# Patient Record
Sex: Female | Born: 1990 | Race: White | Hispanic: No | Marital: Married | Smoking: Never smoker
Health system: Southern US, Community
[De-identification: ages and names within clinical notes are randomized; demographics above are authoritative.]

## PROBLEM LIST (undated history)

## (undated) ENCOUNTER — Inpatient Hospital Stay: Payer: Self-pay

## (undated) DIAGNOSIS — F419 Anxiety disorder, unspecified: Secondary | ICD-10-CM

## (undated) DIAGNOSIS — Z3490 Encounter for supervision of normal pregnancy, unspecified, unspecified trimester: Secondary | ICD-10-CM

## (undated) DIAGNOSIS — F329 Major depressive disorder, single episode, unspecified: Secondary | ICD-10-CM

## (undated) DIAGNOSIS — F32A Depression, unspecified: Secondary | ICD-10-CM

## (undated) DIAGNOSIS — N921 Excessive and frequent menstruation with irregular cycle: Secondary | ICD-10-CM

## (undated) DIAGNOSIS — Z9289 Personal history of other medical treatment: Secondary | ICD-10-CM

## (undated) DIAGNOSIS — R109 Unspecified abdominal pain: Secondary | ICD-10-CM

## (undated) DIAGNOSIS — R112 Nausea with vomiting, unspecified: Secondary | ICD-10-CM

## (undated) DIAGNOSIS — F319 Bipolar disorder, unspecified: Secondary | ICD-10-CM

## (undated) DIAGNOSIS — D649 Anemia, unspecified: Secondary | ICD-10-CM

## (undated) DIAGNOSIS — Z9889 Other specified postprocedural states: Secondary | ICD-10-CM

## (undated) HISTORY — DX: Encounter for supervision of normal pregnancy, unspecified, unspecified trimester: Z34.90

## (undated) HISTORY — PX: TONSILLECTOMY: SUR1361

## (undated) HISTORY — DX: Excessive and frequent menstruation with irregular cycle: N92.1

## (undated) HISTORY — DX: Unspecified abdominal pain: R10.9

## (undated) HISTORY — DX: Anemia, unspecified: D64.9

## (undated) HISTORY — DX: Personal history of other medical treatment: Z92.89

---
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
## 2012-07-16 ENCOUNTER — Ambulatory Visit: Payer: Self-pay | Admitting: Family Medicine

## 2012-07-16 LAB — RAPID STREP-A WITH REFLX: Micro Text Report: NEGATIVE

## 2012-07-21 ENCOUNTER — Ambulatory Visit: Payer: Self-pay | Admitting: Internal Medicine

## 2012-07-21 LAB — URINALYSIS, COMPLETE
Blood: NEGATIVE
Glucose,UR: NEGATIVE mg/dL (ref 0–75)
Nitrite: NEGATIVE
Ph: 6 (ref 4.5–8.0)
Specific Gravity: 1.025 (ref 1.003–1.030)

## 2012-07-21 LAB — CBC WITH DIFFERENTIAL/PLATELET
Basophil %: 0.4 %
Eosinophil #: 0.1 10*3/uL (ref 0.0–0.7)
Eosinophil %: 0.9 %
HCT: 37.1 % (ref 35.0–47.0)
HGB: 12.9 g/dL (ref 12.0–16.0)
Lymphocyte #: 1.8 10*3/uL (ref 1.0–3.6)
Lymphocyte %: 23.6 %
MCV: 87 fL (ref 80–100)
Monocyte #: 0.5 x10 3/mm (ref 0.2–0.9)
Monocyte %: 5.8 %
Neutrophil #: 5.4 10*3/uL (ref 1.4–6.5)
Neutrophil %: 69.3 %
RBC: 4.29 10*6/uL (ref 3.80–5.20)
RDW: 12.6 % (ref 11.5–14.5)
WBC: 7.8 10*3/uL (ref 3.6–11.0)

## 2012-07-21 LAB — COMPREHENSIVE METABOLIC PANEL
Albumin: 4 g/dL (ref 3.8–5.6)
Alkaline Phosphatase: 56 U/L — ABNORMAL LOW (ref 82–169)
BUN: 4 mg/dL — ABNORMAL LOW (ref 7–18)
Bilirubin,Total: 0.6 mg/dL (ref 0.2–1.0)
Calcium, Total: 9.2 mg/dL (ref 9.0–10.7)
Co2: 26 mmol/L (ref 21–32)
EGFR (African American): >60
Glucose: 84 mg/dL (ref 65–99)
SGOT(AST): 23 U/L (ref 0–26)
SGPT (ALT): 37 U/L (ref 12–78)
Sodium: 137 mmol/L (ref 136–145)

## 2012-07-21 LAB — WET PREP, GENITAL

## 2012-07-22 ENCOUNTER — Ambulatory Visit: Payer: Self-pay | Admitting: Family Medicine

## 2012-09-10 ENCOUNTER — Ambulatory Visit: Payer: Self-pay | Admitting: Advanced Practice Midwife

## 2012-09-19 ENCOUNTER — Observation Stay: Payer: Self-pay | Admitting: Obstetrics and Gynecology

## 2012-10-15 ENCOUNTER — Observation Stay: Payer: Self-pay

## 2012-10-16 LAB — URINALYSIS, COMPLETE
Bacteria: NONE SEEN
Bilirubin,UR: NEGATIVE
Blood: NEGATIVE
Glucose,UR: NEGATIVE mg/dL (ref 0–75)
Ketone: NEGATIVE
Ph: 7 (ref 4.5–8.0)
Protein: NEGATIVE
RBC,UR: NONE SEEN /HPF (ref 0–5)
Specific Gravity: 1.004 (ref 1.003–1.030)
Squamous Epithelial: <1
WBC UR: <1 /HPF (ref 0–5)

## 2012-10-16 LAB — FETAL FIBRONECTIN
Appearance: NORMAL
Fetal Fibronectin: NEGATIVE

## 2012-11-30 ENCOUNTER — Observation Stay: Payer: Self-pay

## 2012-11-30 LAB — URINALYSIS, COMPLETE
Bilirubin,UR: NEGATIVE
Blood: NEGATIVE
Glucose,UR: NEGATIVE mg/dL (ref 0–75)
Ketone: NEGATIVE
Leukocyte Esterase: NEGATIVE
Nitrite: NEGATIVE
Ph: 7 (ref 4.5–8.0)
Protein: NEGATIVE
RBC,UR: NONE SEEN /HPF (ref 0–5)
Specific Gravity: 1.005 (ref 1.003–1.030)
Squamous Epithelial: <1
WBC UR: <1 /HPF (ref 0–5)

## 2012-12-08 ENCOUNTER — Observation Stay: Payer: Self-pay | Admitting: Obstetrics and Gynecology

## 2012-12-08 LAB — PROTEIN / CREATININE RATIO, URINE: Creatinine, Urine: 18.3 mg/dL — ABNORMAL LOW (ref 30.0–125.0)

## 2012-12-11 ENCOUNTER — Ambulatory Visit: Payer: Self-pay | Admitting: Emergency Medicine

## 2012-12-13 LAB — BETA STREP CULTURE(ARMC)

## 2012-12-18 ENCOUNTER — Emergency Department: Payer: Self-pay | Admitting: Emergency Medicine

## 2012-12-18 LAB — TROPONIN I: Troponin-I: <0.02 ng/mL

## 2012-12-18 LAB — BASIC METABOLIC PANEL
Anion Gap: 8 (ref 7–16)
BUN: 3 mg/dL — ABNORMAL LOW (ref 7–18)
Chloride: 107 mmol/L (ref 98–107)
Co2: 23 mmol/L (ref 21–32)
EGFR (African American): >60
EGFR (Non-African Amer.): >60
Glucose: 93 mg/dL (ref 65–99)
Osmolality: 272 (ref 275–301)
Potassium: 3.4 mmol/L — ABNORMAL LOW (ref 3.5–5.1)
Sodium: 138 mmol/L (ref 136–145)

## 2012-12-18 LAB — CBC
HCT: 30.7 % — ABNORMAL LOW (ref 35.0–47.0)
MCH: 29.4 pg (ref 26.0–34.0)
MCHC: 35.1 g/dL (ref 32.0–36.0)
Platelet: 226 10*3/uL (ref 150–440)
RBC: 3.67 10*6/uL — ABNORMAL LOW (ref 3.80–5.20)
RDW: 13.5 % (ref 11.5–14.5)
WBC: 9.9 10*3/uL (ref 3.6–11.0)

## 2012-12-18 LAB — CK TOTAL AND CKMB (NOT AT ARMC): CK-MB: <0.5 ng/mL — ABNORMAL LOW (ref 0.5–3.6)

## 2012-12-29 ENCOUNTER — Observation Stay: Payer: Self-pay | Admitting: Obstetrics and Gynecology

## 2012-12-29 LAB — URINALYSIS, COMPLETE
Glucose,UR: NEGATIVE mg/dL (ref 0–75)
Ketone: NEGATIVE
Nitrite: NEGATIVE
Ph: 7 (ref 4.5–8.0)
Protein: NEGATIVE
RBC,UR: 1 /HPF (ref 0–5)
Squamous Epithelial: <1
WBC UR: <1 /HPF (ref 0–5)

## 2013-01-13 ENCOUNTER — Observation Stay: Payer: Self-pay | Admitting: Obstetrics and Gynecology

## 2013-01-25 ENCOUNTER — Observation Stay: Payer: Self-pay | Admitting: Obstetrics and Gynecology

## 2013-01-29 ENCOUNTER — Observation Stay: Payer: Self-pay | Admitting: Obstetrics and Gynecology

## 2013-02-07 ENCOUNTER — Inpatient Hospital Stay: Payer: Self-pay

## 2013-02-07 LAB — CBC WITH DIFFERENTIAL/PLATELET
Basophil #: 0 10*3/uL (ref 0.0–0.1)
Basophil %: 0.3 %
Eosinophil %: 0.9 %
HGB: 10.4 g/dL — ABNORMAL LOW (ref 12.0–16.0)
Lymphocyte #: 1.6 10*3/uL (ref 1.0–3.6)
MCH: 27 pg (ref 26.0–34.0)
MCHC: 34.1 g/dL (ref 32.0–36.0)
MCV: 79 fL — ABNORMAL LOW (ref 80–100)
Monocyte #: 0.8 x10 3/mm (ref 0.2–0.9)
Monocyte %: 8.1 %
Neutrophil #: 7.6 10*3/uL — ABNORMAL HIGH (ref 1.4–6.5)
Neutrophil %: 74.6 %
Platelet: 266 10*3/uL (ref 150–440)
RBC: 3.86 10*6/uL (ref 3.80–5.20)

## 2013-09-21 ENCOUNTER — Emergency Department: Payer: Self-pay | Admitting: Emergency Medicine

## 2013-10-26 DIAGNOSIS — R109 Unspecified abdominal pain: Secondary | ICD-10-CM

## 2013-10-26 DIAGNOSIS — N921 Excessive and frequent menstruation with irregular cycle: Secondary | ICD-10-CM

## 2013-10-26 HISTORY — DX: Excessive and frequent menstruation with irregular cycle: N92.1

## 2013-10-26 HISTORY — DX: Unspecified abdominal pain: R10.9

## 2014-04-16 LAB — COMPREHENSIVE METABOLIC PANEL
ANION GAP: 11 (ref 7–16)
Albumin: 3.9 g/dL (ref 3.4–5.0)
Alkaline Phosphatase: 71 U/L
BUN: 3 mg/dL — ABNORMAL LOW (ref 7–18)
Bilirubin,Total: 0.8 mg/dL (ref 0.2–1.0)
CO2: 21 mmol/L (ref 21–32)
Calcium, Total: 9.2 mg/dL (ref 8.5–10.1)
Chloride: 104 mmol/L (ref 98–107)
Creatinine: 0.61 mg/dL (ref 0.60–1.30)
EGFR (African American): >60
EGFR (Non-African Amer.): >60
GLUCOSE: 89 mg/dL (ref 65–99)
OSMOLALITY: 268 (ref 275–301)
Potassium: 3.4 mmol/L — ABNORMAL LOW (ref 3.5–5.1)
SGOT(AST): 33 U/L (ref 15–37)
SGPT (ALT): 72 U/L — ABNORMAL HIGH
Sodium: 136 mmol/L (ref 136–145)
Total Protein: 7.6 g/dL (ref 6.4–8.2)

## 2014-04-16 LAB — CBC WITH DIFFERENTIAL/PLATELET
BASOS PCT: 0.3 %
Basophil #: 0 10*3/uL (ref 0.0–0.1)
Eosinophil #: 0.1 10*3/uL (ref 0.0–0.7)
Eosinophil %: 0.4 %
HCT: 39.2 % (ref 35.0–47.0)
HGB: 13.3 g/dL (ref 12.0–16.0)
LYMPHS ABS: 2.1 10*3/uL (ref 1.0–3.6)
Lymphocyte %: 14.6 %
MCH: 29.7 pg (ref 26.0–34.0)
MCHC: 33.9 g/dL (ref 32.0–36.0)
MCV: 88 fL (ref 80–100)
MONO ABS: 0.8 x10 3/mm (ref 0.2–0.9)
Monocyte %: 5.6 %
NEUTROS ABS: 11.6 10*3/uL — AB (ref 1.4–6.5)
Neutrophil %: 79.1 %
Platelet: 250 10*3/uL (ref 150–440)
RBC: 4.48 10*6/uL (ref 3.80–5.20)
RDW: 12.9 % (ref 11.5–14.5)
WBC: 14.6 10*3/uL — AB (ref 3.6–11.0)

## 2014-04-16 LAB — URINALYSIS, COMPLETE
Bacteria: NONE SEEN
Bilirubin,UR: NEGATIVE
Blood: NEGATIVE
Glucose,UR: NEGATIVE mg/dL (ref 0–75)
Leukocyte Esterase: NEGATIVE
Nitrite: NEGATIVE
PH: 5 (ref 4.5–8.0)
PROTEIN: NEGATIVE
RBC,UR: 2 /HPF (ref 0–5)
Specific Gravity: 1.015 (ref 1.003–1.030)
WBC UR: 3 /HPF (ref 0–5)

## 2014-04-16 LAB — HCG, QUANTITATIVE, PREGNANCY: Beta Hcg, Quant.: 198963 m[IU]/mL — ABNORMAL HIGH

## 2014-04-17 ENCOUNTER — Inpatient Hospital Stay: Payer: Self-pay | Admitting: Obstetrics and Gynecology

## 2014-04-17 LAB — GC/CHLAMYDIA PROBE AMP

## 2014-04-17 LAB — URINALYSIS, COMPLETE
BILIRUBIN, UR: NEGATIVE
BLOOD: NEGATIVE
Bacteria: NONE SEEN
Glucose,UR: NEGATIVE mg/dL (ref 0–75)
Ketone: NEGATIVE
LEUKOCYTE ESTERASE: NEGATIVE
NITRITE: NEGATIVE
PH: 7 (ref 4.5–8.0)
Protein: NEGATIVE
RBC, UR: NONE SEEN /HPF (ref 0–5)
Specific Gravity: 1.008 (ref 1.003–1.030)
WBC UR: <1 /HPF (ref 0–5)

## 2014-04-17 LAB — T4, FREE: FREE THYROXINE: 1.05 ng/dL (ref 0.76–1.46)

## 2014-04-17 LAB — WET PREP, GENITAL

## 2014-04-17 LAB — TSH: THYROID STIMULATING HORM: 1.19 u[IU]/mL

## 2014-04-18 LAB — URINE CULTURE

## 2014-08-05 ENCOUNTER — Observation Stay: Payer: Self-pay | Admitting: Obstetrics & Gynecology

## 2014-08-05 ENCOUNTER — Emergency Department: Payer: Self-pay | Admitting: Emergency Medicine

## 2014-09-30 ENCOUNTER — Observation Stay: Payer: Self-pay | Admitting: Obstetrics and Gynecology

## 2014-10-01 ENCOUNTER — Observation Stay: Payer: Self-pay | Admitting: Obstetrics and Gynecology

## 2014-10-22 ENCOUNTER — Emergency Department: Payer: Self-pay | Admitting: Emergency Medicine

## 2014-10-27 ENCOUNTER — Observation Stay: Payer: Self-pay

## 2014-10-27 LAB — URINALYSIS, COMPLETE
BACTERIA: NONE SEEN
BLOOD: NEGATIVE
Bilirubin,UR: NEGATIVE
Glucose,UR: NEGATIVE mg/dL (ref 0–75)
Ketone: NEGATIVE
Leukocyte Esterase: NEGATIVE
Nitrite: NEGATIVE
Ph: 7 (ref 4.5–8.0)
Protein: NEGATIVE
RBC,UR: <1 /HPF (ref 0–5)
Specific Gravity: 1.012 (ref 1.003–1.030)
WBC UR: 4 /HPF (ref 0–5)

## 2014-11-04 ENCOUNTER — Observation Stay
Admit: 2014-11-04 | Disposition: A | Discharge status: Home / Self Care | Payer: Self-pay | Attending: Obstetrics and Gynecology | Admitting: Obstetrics and Gynecology

## 2014-11-09 ENCOUNTER — Emergency Department
Admit: 2014-11-09 | Disposition: A | Discharge status: Home / Self Care | Payer: Self-pay | Admitting: Emergency Medicine

## 2014-11-12 ENCOUNTER — Observation Stay
Admit: 2014-11-12 | Disposition: A | Discharge status: Home / Self Care | Payer: Self-pay | Attending: Obstetrics and Gynecology | Admitting: Obstetrics and Gynecology

## 2014-11-17 ENCOUNTER — Inpatient Hospital Stay
Admit: 2014-11-17 | Disposition: A | Discharge status: Home / Self Care | Payer: Self-pay | Attending: Obstetrics and Gynecology | Admitting: Obstetrics and Gynecology

## 2014-11-18 LAB — CBC WITH DIFFERENTIAL/PLATELET
Basophil #: 0.1 10*3/uL (ref 0.0–0.1)
Basophil %: 0.4 %
COMMENT - H1-COM2: NORMAL
EOS ABS: 0.7 10*3/uL (ref 0.0–0.7)
Eosinophil %: 5 %
HCT: 34 % — AB (ref 35.0–47.0)
HGB: 11 g/dL — ABNORMAL LOW (ref 12.0–16.0)
Lymphocyte #: 1.9 10*3/uL (ref 1.0–3.6)
Lymphocyte %: 14.4 %
MCH: 26.4 pg (ref 26.0–34.0)
MCHC: 32.2 g/dL (ref 32.0–36.0)
MCV: 82 fL (ref 80–100)
MONO ABS: 0.9 x10 3/mm (ref 0.2–0.9)
Monocyte %: 6.8 %
NEUTROS ABS: 9.9 10*3/uL — AB (ref 1.4–6.5)
Neutrophil %: 73.4 %
PLATELETS: 279 10*3/uL (ref 150–440)
RBC: 4.15 10*6/uL (ref 3.80–5.20)
RDW: 15.7 % — AB (ref 11.5–14.5)
WBC: 13.5 10*3/uL — ABNORMAL HIGH (ref 3.6–11.0)

## 2014-11-18 LAB — GC/CHLAMYDIA PROBE AMP

## 2014-11-19 LAB — HEMATOCRIT: HCT: 28.8 % — ABNORMAL LOW (ref 35.0–47.0)

## 2014-11-26 NOTE — Consult Note (Signed)
PATIENT NAME:  Anna Flores, Anna Flores MR#:  [id number] DATE OF BIRTH:  April 18, 1991  DATE OF CONSULTATION:  04/17/2014  REFERRING PHYSICIAN:   CONSULTING PHYSICIAN:  Vadhir Mcnay A. Egbert GaribaldiBird, MD  REASON FOR CONSULTATION: Evaluation of abdominal pain, possible appendicitis.   HISTORY: This is a 24 year old white female in her second pregnancy at 8 weeks whose pregnancy has been complicated by hyperemesis gravidarum. She was napping yesterday afternoon around 2:00 p.m. and then had what she felt was a knot at her umbilicus. This was made worse with motion and with coughing or sneezing. The patient has had continued nausea and vomiting. No diarrhea. No fever. No sick contacts. No jaundice. No dysuria. Of note, the patient states that she does not have pain other than with motion or palpation of her abdomen. She has mild anorexia. While in the Emergency Room, and an MRI scan was performed and found to be negative for appendicitis.   PAST MEDICAL HISTORY: Significant for hyperemesis gravidarum.   PAST SURGICAL HISTORY: Tonsillectomy in January 2015.   ALLERGIES: CHERRIES AND AMOXICILLIN.   MEDICATIONS: Prenatal vitamins and Diclegis.   FAMILY HISTORY: Noncontributory.  REVIEW OF SYSTEMS: As described above.   PHYSICAL EXAMINATION:  GENERAL: The patient is in no obvious distress. She was chatting pleasantly with her significant other prior for me coming into the room, according to the RN.  VITAL SIGNS: Temperature is 98, pulse of 96, blood pressure is 120/72, 5 feet 4 inches, 161 pounds, BMI of 27.8.  ABDOMEN: Soft. There is mild tenderness in both the left lower quadrant and right lower quadrant. There is a negative Rovsing sign. There is no cellulitis. There is no mass, no hernia.   LABORATORY VALUES: Beta hCG is grossly positive. CBC 14.6, hemoglobin is 13.3, platelet count 250,000. Neutrophil count is elevated. Urinalysis is negative. Electrolytes are unremarkable. Liver function tests are normal. Lipase not  obtained.  IMAGING: Review of MRI scan is as described above.   IMPRESSION: A 24 year old, gravida 2, para 0 at [redacted] weeks gestation with abdominal pain, hyperemesis gravidarum, and abdominal pain which sounds by history and exam to be more consistent with musculoskeletal in origin. She will need to be admitted to the obstetric service. We will be available for serial abdominal examination. I discussed this with the patient and her significant other as well as with Dr. Jean RosenthalJackson by telephone.  TOTAL TIME SPENT: 45 minutes.    ____________________________ Redge GainerMark A. Egbert GaribaldiBird, MD mab:ST D: 04/17/2014 01:54:33 ET T: 04/17/2014 02:18:16 ET JOB#: [id number]  cc: Loraine LericheMark A. Egbert GaribaldiBird, MD, <Dictator> Jacci Ruberg A Quinne Pires MD ELECTRONICALLY SIGNED 04/17/2014 4:06

## 2014-11-26 NOTE — H&P (Signed)
Subjective/Chief Complaint Sudden-onset abdominal pain   History of Present Illness 24 yo G2P1001 at 71w1dgestational age by a 6 week ultrasound, pregnancy complicated by nausea and vomiting of pregnancy, who presents with sudden-onset right lower quadrant abdominal pain.  She was napping the afternoon of 9/12 and was awakened by periumbilical abdominal pain that was sharp and constant in nature.  The pain does not radiate.  The pain is stated to be 10 out of 10.  Moving her body, coughing, sneezing makes it worse.  Nothing makes it feel better.  She has been having nausea and vomiting in her pregnancy and has lost 11 pounds in the past two weeks.  This is no different today and is unchanged with her presenting symptoms. She has been taking Diclegis (Vitamin B6 and doxylamine) for this.  She denies fevers, but notes feeling like she has the chills.  She has not eaten today, but has kept down some water and Gatoraid, which did not make the pain worse.  She notes no urinary symptoms.  Using the bathroom does not make the pain worse. She denies vaginal bleeding and uterine cramping.  She has no other vaginal symptoms.   Past History Past Medical History: None apart from current pregnancy Past Surgical History: 1) Tonsillectomy in January 2015   Code Status Full Code   ALLERGIES:  Cherries: Anaphylaxis  Amoxicillin: Hives  HOME MEDICATIONS: Medication Instructions Status  Prenatal Multivitamins with Folic Acid 1 mg oral tablet 1 tab(s) orally once a day Active  Diclegis 10 mg-10 mg oral delayed release tablet 2 tab(s) orally once a day (at bedtime) Active   Family and Social History:  Family History Unknown as patient is adopted   Social History negative tobacco, negative ETOH, negative Illicit drugs   Place of Living Home   Review of Systems:  Fever/Chills Chills   Cough No   Sputum No   Abdominal Pain Yes   Diarrhea No   Constipation No   Nausea/Vomiting Yes   SOB/DOE No    Chest Pain No   Dysuria No   Tolerating Diet Nauseated   Medications/Allergies Reviewed Medications/Allergies reviewed   Physical Exam:  GEN well developed, well nourished   NECK supple  No masses  trachea midline   RESP normal resp effort  clear BS   CARD regular rate  no murmur  No LE edema   ABD Soft, +TTP especially in RLQ near McBurney's, +Rovsings sign, +Rebound, neg guarding, negatiev Murphy's, +BS, no masses palpated   GU Pelvic: normal external female genitalia, normal Bartholin's/Skene glands and urethra, vagina is pink and normally rugated, Cervix is without lesion, no bleeding, is nontendern to palpation without cervical motion tenderness, no adnexal masses or fullness.  There is bilateral adnexal tenderness R>L, no uterine tenderness,   LYMPH negative neck   EXTR negative cyanosis/clubbing, negative edema   SKIN normal to palpation   NEURO motor/sensory function intact   PSYCH A+O to time, place, person   Additional Comments Vital Signs: Temp: 44F, P68-96, BP 105-136/58-79, RR 18-20, O2 sats 100%RA, Pain 10/10   Lab Results: Hepatic:  12-Sep-15 16:12   Bilirubin, Total 0.8  Alkaline Phosphatase 71 (46-116 NOTE: New Reference Range 02/22/14)  SGPT (ALT)  72 (14-63 NOTE: New Reference Range 02/22/14)  SGOT (AST) 33  Total Protein, Serum 7.6  Albumin, Serum 3.9  Routine Chem:  12-Sep-15 16:12   Glucose, Serum 89  BUN  3  Creatinine (comp) 0.61  Sodium, Serum 136  Potassium,  Serum  3.4  Chloride, Serum 104  CO2, Serum 21  Calcium (Total), Serum 9.2  Osmolality (calc) 268  eGFR (African American) >60  eGFR (Non-African American) >60 (eGFR values <38m/min/1.73 m2 may be an indication of chronic kidney disease (CKD). Calculated eGFR is useful in patients with stable renal function. The eGFR calculation will not be reliable in acutely ill patients when serum creatinine is changing rapidly. It is not useful in  patients on dialysis. The eGFR  calculation may not be applicable to patients at the low and high extremes of body sizes, pregnant women, and vegetarians.)  Anion Gap 11  HCG Betasubunit Quant. Serum  198963 (1-3  (International Unit)  ----------------- Non-pregnant <5 Weeks Post LMP mIU/mL  3- 4 wk 9 - 130  4- 5 wk 75 - 2,600  5- 6 wk 850 - 20,800  6- 7 wk 4,000 - 100,000  7-12 wk 11,500 - 289,000 12-16 wk 18,000 - 137,000 16-29 wk 1,400 - 53,000 29-41 wk 940 - 60,000)  Routine UA:  12-Sep-15 16:12   Color (UA) Yellow  Clarity (UA) Hazy  Glucose (UA) Negative  Bilirubin (UA) Negative  Ketones (UA) 2+  Specific Gravity (UA) 1.015  Blood (UA) Negative  pH (UA) 5.0  Protein (UA) Negative  Nitrite (UA) Negative  Leukocyte Esterase (UA) Negative (Result(s) reported on 16 Apr 2014 at 04:53PM.)  RBC (UA) 2 /HPF  WBC (UA) 3 /HPF  Bacteria (UA) NONE SEEN  Epithelial Cells (UA) 14 /HPF  Mucous (UA) PRESENT (Result(s) reported on 16 Apr 2014 at 04:53PM.)  Routine Hem:  12-Sep-15 16:12   WBC (CBC)  14.6  RBC (CBC) 4.48  Hemoglobin (CBC) 13.3  Hematocrit (CBC) 39.2  Platelet Count (CBC) 250  MCV 88  MCH 29.7  MCHC 33.9  RDW 12.9  Neutrophil % 79.1  Lymphocyte % 14.6  Monocyte % 5.6  Eosinophil % 0.4  Basophil % 0.3  Neutrophil #  11.6  Lymphocyte # 2.1  Monocyte # 0.8  Eosinophil # 0.1  Basophil # 0.0 (Result(s) reported on 16 Apr 2014 at 04:24PM.)   Radiology Results: [place]    12-Sep-15 18:57, [place] Less Than 14 Weeks w/ Transvaginal  [place] Less Than 14 Weeks w/ Transvaginal  REASON FOR EXAM:    RLQ pain of acute onset  COMMENTS:       PROCEDURE: [place] - [place] LESS THAN 14 WEEKS/W TRANS  - Apr 16 2014  6:57PM     CLINICAL DATA:  RIGHT lower quadrant pain. Acute onset RIGHT lower  quadrant pain. Positive pregnancy test.    EXAM:  OBSTETRIC <14 WK [place] TRANSVAGINAL OB [place]   TECHNIQUE:  Both transabdominal and transvaginal ultrasound examinations were  performed for complete evaluation of  the gestation as well as the  maternal uterus, adnexal regions, and pelvic cul-de-sac.  Transvaginal technique was performed to assess early pregnancy.    COMPARISON:  None.    FINDINGS:  Intrauterine gestational sac: Visualized/normal in shape.    Yolk sac:  Present    Embryo:  Present    Cardiac Activity: Present    Heart Rate:  165 bpm  CRL:   1.8  mm   8 w 2 d                  [place]: 11/23/2013    Maternal uterus/adnexae: No subchorionic hemorrhage. RIGHT ovary  appears normal. LEFT ovary not visible. RIGHT fundal fibroid  measures 3 cm.  No free fluid.     IMPRESSION:  Uncomplicated single intrauterine pregnancy.      Electronically Signed    By: Dereck Ligas M.D.    On: 04/16/2014 19:27       Verified By: Melvyn Novas, M.D.,  MRI:    12-Sep-15 22:20, MRI Abdomen Without Contrast  MRI Abdomen Without Contrast  REASON FOR EXAM:    RLQ pain, pregnancy  COMMENTS:       PROCEDURE: MR  - MR ABDOMEN WO CONTRAST  - Apr 16 2014 10:20PM     CLINICAL DATA:  Eight weeks pregnant with right lower quadrant pain,  fever and elevated white blood cell count.    EXAM:  MRIABDOMEN AND PELVIS WITHOUT CONTRAST    TECHNIQUE:  Multiplanar multisequence MR imaging of the abdomen and pelvis was  performed. No intravenous contrast was administered.  COMPARISON:  Pelvic ultrasound 04/16/2014.    FINDINGS:  MRI ABDOMEN FINDINGS    The solid abdominal organs are unremarkable. No inflammatory changes  or mass lesions. The gallbladder is normal. No gallstones or  evidence of acute cholecystitis. The common bile duct is within  normal limits in caliber. No hydronephrosis.    Noupper abdominal mass or adenopathy. The aorta is normal in  caliber.    MRI PELVIS FINDINGS  The appendix is normal. No inflammatory changes or mass lesions in  the pelvis. The gravid uterus is noted. Both ovaries are normal. The  bladder is normal. The     IMPRESSION:  No MR findings for acute  appendicitis.    Gravid uterus in the pelvis.  Both ovaries are normal.    No findings for acute cholecystitis or hydronephrosis.      Electronically Signed    By: Kalman Jewels M.D.    On: 04/16/2014 23:27     Verified By: Marlane Hatcher, M.D.,    Assessment/Admission Diagnosis Acute abdominal pain complicating pregnancy: Pain present less than 24 hours.  Origin is unknown.  Slightly elevated WBC count, MRI and pelvic ultrasound without obvious etiology.  Discussed with patient that possible causes could be OB/GYN, GI, urologic, and musculoskeletal.   Plan - Admit for observation.   - Will perform serial abdominal exams - Treat for abdominal pain with IV narcotic pain meds. - treat for nausea, as needed.  NPO initially.  Will advance diet if non-surgical and can tolerate. - General Surgery consult to assess abdomen given her peritoneal signs. - dispo based on outcome of exams and surgery consult.   Electronic Signatures: Will Bonnet (MD)  (Signed 13-Sep-15 01:20)  Authored: CHIEF COMPLAINT and HISTORY, ALLERGIES, HOME MEDICATIONS, FAMILY AND SOCIAL HISTORY, REVIEW OF SYSTEMS, PHYSICAL EXAM, LABS, Radiology, ASSESSMENT AND PLAN   Last Updated: 13-Sep-15 01:20 by Will Bonnet (MD)

## 2014-11-26 NOTE — Consult Note (Signed)
Brief Consult Note: Diagnosis: abdominal pain negative MRI with less than 12 hours symptoms, symptoms and history sound MSK in origin.   Patient was seen by consultant.   Consult note dictated.   Recommend further assessment or treatment.   Discussed with Attending MD.   Comments: no surgical intervention planned at this time.  Electronic Signatures: Natale LayBird, Lajoy Vanamburg (MD)  (Signed 13-Sep-15 01:51)  Authored: Brief Consult Note   Last Updated: 13-Sep-15 01:51 by Natale LayBird, Tywanda Rice (MD)

## 2014-12-13 NOTE — H&P (Signed)
L&D Evaluation:  History:  HPI -CC: uterine contractions -HPI: 24 y/o G2P1001 @ 31/6 (EDC 4/23, dated by 6wk u/s) with above. Preg uncomplicated. Has been having regular UCs since tuesday night but usually it's just night but this AM she started having them and they've continued. Patient seen in the office and closed/long/high there so FFN not sent and pt told to come to L&D if UCs continued, which they have. No VB, LOF or decreased FM, or dysuria.   Medications Pre Natal Vitamins   Allergies amox, cherries   Exam:  Vital Signs af vs normal and stable   General no apparent distress, not painfully contracting   Mental Status clear   Chest clear   Heart normal sinus rhythm   Abdomen gravid, non-tender   Pelvic cl/long/high. SSE negative and wet prep negative. cx visually closed   Mebranes Intact   FHT 125 baseline, +accels, no decels (maternal per nurse), mod var   Ucx q2821m   Impression:  Impression pt stable   Plan:  Comments *IUP: rNST and category I tracing *UCs: Cx exam unchanged over several hours. I don't believe she's in preterm labor, so no need for steroids, Mg, abx currently. Will send u/a and culture and GC/CT obtained and ordered CBC. consider terb once other causes ruled out and pt just considered to have regular UCs w/o cervical change. If in labor, will need scan to check position and start above measures. avoid nsaids given close to 32 weeks   Electronic Signatures: [place] BingPickens, Toribio Seiber (MD)  (Signed 26-Feb-16 14:46)  Authored: L&D Evaluation   Last Updated: 26-Feb-16 14:46 by [place] BingPickens, Sean Macwilliams (MD)

## 2014-12-13 NOTE — H&P (Signed)
L&D Evaluation:  History:  HPI 24 yo G1 at 4044w5d by D=12wk US derived EDD of 02/01/2013 presenting increasing contractions since 12:30 today.  She was seen in clinic and had her mebranes stripped.  She has had some light pink spotting,  no LOF, +FM.    PNC at [facility] uncomplicated.  PNL O pos / ABSC neg / RI / VZI / RPR NR / HIV neg / HBsAg neg / Tetra negative 08/20/12 / 1-hr OGTT 98 / GBS neg 01/07/13 / GC&CT neg & neg 01/07/13.  TDAP received 11/17/12   Presents with contractions   Patient's Medical History Depression (baseline EPDS 11), anxiety, anemia   Patient's Surgical History none   Medications Pre Natal Vitamins   Allergies amoxicillin, cherries   Social History none   Family History Non-Contributory   ROS:  ROS All systems were reviewed.  HEENT, CNS, GI, GU, Respiratory, CV, Renal and Musculoskeletal systems were found to be normal.   Exam:  Vital Signs stable   Urine Protein not completed   General no apparent distress   Mental Status clear   Chest no increased work of breathing   Abdomen gravid, non-tender   Estimated Fetal Weight Average for gestational age   Fetal Position vtx   Edema no edema   Pelvic no external lesions, 1.5/60/-2   Mebranes Intact   FHT normal rate with no decels, reactive category I tracing   Ucx irregular   Impression:  Impression Braxton Hick contractions   Plan:  Plan EFM/NST   Comments - Cervix unchanged over 2-hrs, NST reactive - Discharge home with ambien rx - IOL scheduled for 02/07/13 cervidil - has follow up next week   Follow Up Appointment already scheduled   Electronic Signatures: Lorrene ReidStaebler, Lyra Alaimo M (MD)  (Signed 28-Jun-14 03:16)  Authored: L&D Evaluation   Last Updated: 28-Jun-14 03:16 by Lorrene ReidStaebler, Remer Couse M (MD)

## 2014-12-13 NOTE — H&P (Signed)
L&D Evaluation:  History:  HPI -CC: regular UCs -HPI: 24 y/o G2P1001 @ 38/5 (edc 4/23, dated by LMP=12wk u/s) with the above CC. Preg c/b h/o 3rd degree after VAVD and ML epis (OP presentation).  No VB, LOF. Pt states baby isn't moving as much as she thought   Medications none   Allergies amox   Exam:  General no apparent distress, mild distress with UCs   Mental Status clear   Chest ctab   Heart rrr no mrgs   Abdomen gravid, non-tender   Estimated Fetal Weight Average for gestational age, Leopolds 983600gm   Fetal Position cephalic   Pelvic tight 2-3/50/-1/BOW intact, cephalic. Prior exam by RN at approximately 1845 was 2-3cm too   Mebranes Intact   FHT see below   Impression:  Impression latent labor, category II tracing   Plan:  Comments *EOL: negative and pt not painfully contracting. will reassess in a few hours (see below) *Category II tracing: had two subtle lates right as put on EFM but has had normal baseline, moderate variability with +accels for over two hours since, while contracting about q3-6743m.  While I was in the room, patient had tachysystole with likely prolonged early decel for 2-723m to the 90s-100s. Came back up to baseline and with moderate variability.  Will leave on EFM for 3 more hours and then reassess and recheck SVE   Electronic Signatures: [place] BingPickens, Naresh Althaus (MD)  (Signed 14-Apr-16 21:09)  Authored: L&D Evaluation   Last Updated: 14-Apr-16 21:09 by [place] BingPickens, Kysen Wetherington (MD)

## 2014-12-13 NOTE — H&P (Signed)
L&D Evaluation:  History:  HPI 24 yo G1 at 7377w2d by D=12wk US derived EDD of 02/01/2013 presenting with increased vaginal discharge.  The patient states discharge is white/clear.  No gush.  +FM, no VB, occasional braxton hicks contractions.  PNC at [facility] uncomplicated.  PNL O pos / ABSC neg / RI / VZI / RPR NR / HIV neg / HBsAg neg / Tetra negative 08/20/12 / 1-hr OGTT 98 / GBS neg 01/07/13 / GC&CT neg & neg 01/07/13.  TDAP received 11/17/12   Presents with R/O ROM   Patient's Medical History Depression (baseline EPDS 11), anxiety, anemia   Patient's Surgical History none   Medications Pre Natal Vitamins   Allergies amoxicillin, cherries   Social History none   Family History Non-Contributory   ROS:  ROS All systems were reviewed.  HEENT, CNS, GI, GU, Respiratory, CV, Renal and Musculoskeletal systems were found to be normal.   Exam:  Vital Signs stable   Urine Protein not completed   General no apparent distress   Mental Status clear   Chest no increased work of brathing   Abdomen gravid, non-tender   Estimated Fetal Weight Average for gestational age   Fetal Position vtx   Edema no edema   Pelvic no external lesions, cervix closed and thick, negative nitrazine   Mebranes Intact   FHT normal rate with no decels   Ucx irregular   Impression:  Impression No evidence of ruptured membranes   Plan:  Comments - Routine labor precautions - [facility] will [facility] follow up appointment on 6/19 has follow up at [facility] tomorrow   Follow Up Appointment already scheduled   Electronic Signatures: Lorrene ReidStaebler, Devani Odonnel M (MD)  (Signed 11-Jun-14 13:43)  Authored: L&D Evaluation   Last Updated: 11-Jun-14 13:43 by Lorrene ReidStaebler, Shaquira Moroz M (MD)

## 2014-12-13 NOTE — H&P (Signed)
L&D Evaluation:  History:  HPI 24 y/o G2P1001 @ 524w6d  by 6wk US derived EDC of 11/26/14 presenting with headache and visual disturbances starting this afternoon around 16:00, called around 15:00 and asked to present to L&D, presente to L&D 20:30. Headache and vison somewhat improved.  Visual disturbance describes as floaters, looks like I was looking through a koleidoscope.  Only left side.  No history of migraines.  Has not been sleeping well past couple of nights.  Also reports some SOB coinciding with contractions (normal pulse and pulse ox). The patient is supposed to be wearing corrective lenses but is not doing so.  Denies RUQ, epigastric pain, increased edema.  No VB, LOF or decreased FM, or dysuria.   Presents with headache   Patient's Medical History No Chronic Illness   Medications Pre Natal Vitamins   Allergies amox, cherries   Social History none   Family History Non-Contributory   ROS:  ROS negative unless otherwise noted in HPI   Exam:  Vital Signs AF VSS   Urine Protein negative dipstick   General no apparent distress, uncomfortable with contractions   Mental Status clear   Chest clear   Heart normal sinus rhythm   Abdomen gravid, non-tender   Estimated Fetal Weight Average for gestational age   Fetal Position vtx by leopolds   Edema no edema   Reflexes 1+  bilateral patellar and brachioradialis   Pelvic 2/50/-3 per nursing same as last clinic check   Mebranes Intact   FHT 125, moderate, positive accels, no decels   Ucx regular, q4974m   Impression:  Impression 1537w6d with multiple concerns/symptoms   Plan:  Comments 1) Headache/nausea - 650mg  of acetominophen once, 10mg  tab compazine po once  2) Visual disturbances - normal visual field.  Vision  20/50 vision right eye only, 20/50 left eye only, and 20/40 using both eyes.  States 20/50 is her uncorrected vision last time she went to opthamologist 8 months ago.  BP is normotensive, negative  protein on urine dip no concern for preeclampsia, other than headhache not other neurologic symptoms or deficits on exam.  Suspect visual migraine. - patient instructed to start wearing her corrective lenses  3) Shortness of breath - calves symetric, no edema, lungs clear, and normal pulse and pulse ox.  Appears to coincide with contractions.  No concern for PE based on history and exam  4) Disposition - will recheck in 1-2 hrs, reassess symptoms after medications.  Discussed ambien as possible sleep aid.  Has follow up next week   Electronic Signatures: Lorrene ReidStaebler, Keldon Lassen M (MD)  (Signed 01-Apr-16 20:54)  Authored: L&D Evaluation   Last Updated: 01-Apr-16 20:54 by Lorrene ReidStaebler, Yecenia Dalgleish M (MD)

## 2014-12-13 NOTE — H&P (Signed)
L&D Evaluation:  History:  HPI 24yo G1P0  with LMP of 04/27/13 & EDd of 02/01/13 presents to birthplace with pain in the upper abd area, back pain and pain running all along the Rt upper and lower quadrant. Pt describes as a "sharp electrical type of pain" and a pressure sensation. Pt had area of sensitivity around the belly button the other day and still is uncomfortable after it was bruised the other day. No VB,ROM or any other labor S/S. PNC at [facility] significant for teen preg, hx of depression, tDap out of date.   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   Family History Non-Contributory   ROS:  ROS All systems were reviewed.  HEENT, CNS, GI, GU, Respiratory, CV, Renal and Musculoskeletal systems were found to be normal.   Exam:  Vital Signs stable   General no apparent distress   Mental Status clear   Chest clear   Heart normal sinus rhythm, no murmur/gallop/rubs   Abdomen gravid, non-tender   Estimated Fetal Weight Average for gestational age   Back no CVAT   Edema no edema   Reflexes 1+   Clonus negative   Mebranes Intact   FHT normal rate with no decels, reactive NSt   Ucx absent   Skin dry   Lymph no lymphadenopathy   Impression:  Impression IUP at 244/7 weeks   Plan:  Plan Dc home.   Comments Heat to lower back, Tylenol OTC. FU for worsening of S/S   Electronic Signatures: Sharee PimpleJones, Caron W (CNM)  (Signed 14-Mar-14 09:16)  Authored: L&D Evaluation   Last Updated: 14-Mar-14 09:16 by Sharee PimpleJones, Caron W (CNM)

## 2014-12-13 NOTE — H&P (Signed)
L&D Evaluation:  History Expanded:  HPI 24 yo G1 EDD of 02/01/2013 per LMP and 12 week US, presented last night for IOL for postdates. PNC at [facility] notable for depression & anxiety in early pregnancy r/t death of mother-in-law. Pt reports "feeling fine" now. No LOF, +FM, no VB.   PNC at [facility] uncomplicated.  PNL O pos / ABSC neg / RI / VZI / RPR NR / HIV neg / HBsAg neg / Tetra negative 08/20/12 / 1-hr OGTT 98 / GBS neg 01/07/13 / GC&CT neg & neg 01/07/13.  TDAP received 11/17/12   Blood Type (Maternal) O positive   Group B Strep Results Maternal (Result >5wks must be treated as unknown) negative   Maternal HIV Negative   Maternal Syphilis Ab Nonreactive   Maternal Varicella Immune   Rubella Results (Maternal) immune   Maternal T-Dap Immune   Presents with contractions   Patient's Medical History Depression (baseline EPDS 11), anxiety, anemia   Patient's Surgical History none   Medications no longer taking PNV & Fe d/t intolerance   Allergies amoxicillin, cherries   Social History none   Family History Non-Contributory   ROS:  ROS All systems were reviewed.  HEENT, CNS, GI, GU, Respiratory, CV, Renal and Musculoskeletal systems were found to be normal.   Exam:  Vital Signs stable   General no apparent distress   Mental Status clear   Chest no increased work of breathing   Heart no murmur/gallop/rubs   Abdomen gravid - beginning to feel pain of contractions   Estimated Fetal Weight Average for gestational age, 937 #   Fetal Position vtx   Edema no edema   Pelvic no external lesions, 2/60/-1, posterior   Mebranes Intact   FHT normal rate with no decels, reactive category I tracing, baseline 110, +accels   Ucx regular, q 3-5 min   Impression:  Impression IOL for postdates   Plan:  Plan EFM/NST   Comments Pitocin induction Plan AROM once in active labor Pt desires epidural for pain medication once indicated   Electronic Signatures: Vella KohlerBrothers, Aidyn Kellis  K (CNM)  (Signed 07-Jul-14 09:16)  Authored: L&D Evaluation   Last Updated: 07-Jul-14 09:16 by Vella KohlerBrothers, Khiana Camino K (CNM)

## 2014-12-13 NOTE — H&P (Signed)
L&D Evaluation:  History:  HPI 24 yo G1P0 at 20+5 weeks comes in from [employer] where she had  a presyncopal episode. Was squatting looking at products and stood and had to sitt, greying of vision and ringing in ears. Pt hads had several similar episode like this in pregnancy. Recently has moved from [place] and she has initiated care at [facility].  No leakage of fluid no vaginal bleeding..   Patient's Medical History No Chronic Illness   Patient's Surgical History none   Medications Pre Natal Vitamins   Allergies PCN   Social History none   Family History Non-Contributory   ROS:  ROS All systems were reviewed.  HEENT, CNS, GI, GU, Respiratory, CV, Renal and Musculoskeletal systems were found to be normal.   Exam:  Vital Signs stable  tilts laying 105/61 p= 68, sitting 103/68 p=78 , standing  98/65 P=81   General no apparent distress   Mental Status clear   Chest clear   Heart normal sinus rhythm   Abdomen gravid, non-tender   Back no CVAT   Edema no edema   FHT normal rate with no decels, for 20- weeks   Ucx absent   Skin dry   Impression:  Impression [redacted] weeks gestation, with vasovagal episode.. Mild dehydration by titls.   Plan:  Plan couseled patient regarding po intake , more nutrition in am and more fluid in take. If symptoms worsen or persist consider getting cardiology w/up . Pt will f/up with [facility]   Electronic Signatures: Ledarius Leeson, Ihor Austinhomas J (MD)  (Signed 15-Feb-14 15:55)  Authored: L&D Evaluation   Last Updated: 15-Feb-14 15:55 by Suzy BouchardSchermerhorn, Jonah Nestle J (MD)

## 2014-12-13 NOTE — H&P (Signed)
L&D Evaluation:  History:  HPI 24 y/o G2P1001 @ 4913w0d  by 6wk US derived EDC of 11/26/14 presenting with irregular contractions after drinking 3 tablespoons of castor oil.  No LOF, no VB, and +FM   Presents with contractions   Patient's Medical History No Chronic Illness   Medications Pre Natal Vitamins   Allergies amox, cherries   Social History none   Family History Non-Contributory   ROS:  ROS negative unless otherwise noted in HPI   Exam:  Vital Signs AF VSS   Urine Protein negative dipstick   General no apparent distress   Mental Status clear   Chest no increased work of breathing   Abdomen gravid, non-tender   Estimated Fetal Weight Average for gestational age   Fetal Position vtx by leopolds   Edema no edema   Pelvic no external lesions, 3/70/-2   Mebranes Intact   FHT 120, moderate, positive accels, no decels   Ucx regular, q6997m   Impression:  Impression 2813w0d braxton hicks contractions   Plan:  Plan EFM/NST   Comments 1) Braxtonn hicks contractions - irregular, plapate mild. No cervical change of 1-hr and unchanged from clinic earlier this week  2) Fetus - category I tracing/reactive NSt  3) [facility]   Electronic Signatures: Lorrene ReidStaebler, Akua Blethen M (MD)  (Signed 09-Apr-16 12:38)  Authored: L&D Evaluation   Last Updated: 09-Apr-16 12:38 by Lorrene ReidStaebler, Millie Forde M (MD)

## 2014-12-30 ENCOUNTER — Ambulatory Visit
Admission: RE | Admit: 2014-12-30 | Discharge: 2014-12-30 | Disposition: A | Discharge status: Home / Self Care | Payer: Self-pay | Source: Ambulatory Visit | Attending: Obstetrics and Gynecology | Admitting: Obstetrics and Gynecology

## 2015-01-04 NOTE — Lactation Note (Signed)
Lactation Consultation Note  Patient Name: Anna Flores ZOXWR'UToday's Date: 01/04/2015  *Consult Note is past due*.    Maternal Data   Mom presented with blistered nipples on both sides at 6wks postpartum. Mom was told by OB that only baby needed to be treated for Thrush. Mom has prior breastfeeding hx and breastfeed last baby who is now 3 for almost 1272yrs.  Infant Data Infant presents with Ginette Pitmanhrush and has been taking diflucan for approx. 10 days, but thrush remains on infant's jaws. Pediatrician also says that baby appears to have tongue-tie.  Feeding  Infant was able to correctly latch with minimal support from [facility]. LC had to adjust mom's hold and assist mom with sandwiching of the breasts to reduce the clicking sounds heard during feedings. Baby does not have a tongue-tie.  Infant has complete ROM of tongue and tongue moves past the gumline. Baby just needed to have a deeper latch.   LATCH Score/Interventions  The dyad presented with a LATCH score of an 8.                    Lactation Tools Discussed/Used  Mom was given hand-out sheet about Thrush and given verbal instruction to re-vist OB and have rx written for antibiotics to treat her thrush too.    Consult Status  Complete    Burnadette PeterJaniya M Yasmyn Bellisario 01/04/2015, 1:02 PM

## 2015-03-02 ENCOUNTER — Encounter: Payer: Self-pay | Admitting: *Deleted

## 2015-03-02 ENCOUNTER — Emergency Department
Admission: EM | Admit: 2015-03-02 | Discharge: 2015-03-02 | Disposition: A | Discharge status: Home / Self Care | Payer: Medicaid Other | Attending: Emergency Medicine | Admitting: Emergency Medicine

## 2015-03-02 ENCOUNTER — Other Ambulatory Visit: Payer: Self-pay

## 2015-03-02 DIAGNOSIS — B349 Viral infection, unspecified: Secondary | ICD-10-CM | POA: Insufficient documentation

## 2015-03-02 DIAGNOSIS — R079 Chest pain, unspecified: Secondary | ICD-10-CM | POA: Diagnosis present

## 2015-03-02 DIAGNOSIS — Z880 Allergy status to penicillin: Secondary | ICD-10-CM | POA: Diagnosis not present

## 2015-03-02 LAB — BASIC METABOLIC PANEL
Anion gap: 9 (ref 5–15)
BUN: 9 mg/dL (ref 6–20)
CALCIUM: 9.3 mg/dL (ref 8.9–10.3)
CO2: 26 mmol/L (ref 22–32)
CREATININE: 0.77 mg/dL (ref 0.44–1.00)
Chloride: 100 mmol/L — ABNORMAL LOW (ref 101–111)
GFR calc Af Amer: >60 mL/min (ref >60)
GFR calc non Af Amer: >60 mL/min (ref >60)
Glucose, Bld: 85 mg/dL (ref 65–99)
Potassium: 3.6 mmol/L (ref 3.5–5.1)
Sodium: 135 mmol/L (ref 135–145)

## 2015-03-02 LAB — CBC
HEMATOCRIT: 38.9 % (ref 35.0–47.0)
HEMOGLOBIN: 13.1 g/dL (ref 12.0–16.0)
MCH: 28.5 pg (ref 26.0–34.0)
MCHC: 33.7 g/dL (ref 32.0–36.0)
MCV: 84.5 fL (ref 80.0–100.0)
Platelets: 269 10*3/uL (ref 150–440)
RBC: 4.6 MIL/uL (ref 3.80–5.20)
RDW: 14.1 % (ref 11.5–14.5)
WBC: 9.6 10*3/uL (ref 3.6–11.0)

## 2015-03-02 LAB — TROPONIN I

## 2015-03-02 MED ORDER — ONDANSETRON 4 MG PO TBDP: 4.0000 mg | ORAL_TABLET | Freq: Three times a day (TID) | ORAL | Status: DC | PRN

## 2015-03-02 MED ORDER — DIPHENHYDRAMINE HCL 25 MG PO CAPS: 25.0000 mg | ORAL_CAPSULE | Freq: Four times a day (QID) | ORAL | Status: DC | PRN

## 2015-03-02 NOTE — ED Provider Notes (Signed)
[facility] Emergency Department Provider Note  ____________________________________________  Time seen: Approximately 3:07 PM  I have reviewed the triage vital signs and the nursing notes.   HISTORY  Chief Complaint Chest Pain    HPI Anna Flores is a 24 y.o. female patient complaining of 4 days of intermittent fever vomiting diarrhea and dizziness. Patient states she was positive for strep pharyngitis 2 days ago and is currently taking Zithromax. Patient also states she's had URI signs symptoms consistent mostly of intermittent nasal congestion and clear rhinorrhea. Patient admits to the postnasal drainage. She also states she's having some left lateral neck pain. Patient is currently breast-feeding. Patient is rating her pain discomfort as 8/10. Except for taking the Zithromax for her pharyngitis no other palliative measures.   History reviewed. No pertinent past medical history.  There are no active problems to display for this patient.   History reviewed. No pertinent past surgical history.  Current Outpatient Rx  Name  Route  Sig  Dispense  Refill  . diphenhydrAMINE (BENADRYL) 25 mg capsule   Oral   Take 1 capsule (25 mg total) by mouth every 6 (six) hours as needed.   30 capsule   0   . ondansetron (ZOFRAN ODT) 4 MG disintegrating tablet   Oral   Take 1 tablet (4 mg total) by mouth every 8 (eight) hours as needed for nausea or vomiting.   12 tablet   0     Allergies Amoxicillin  No family history on file.  Social History History  Substance Use Topics  . Smoking status: Never Smoker   . Smokeless tobacco: Not on file  . Alcohol Use: No    Review of Systems Constitutional: States fevers and chills Eyes: No visual changes. ENT: Sore throat  Cardiovascular: Chest pain Respiratory: Denies shortness of breath. Gastrointestinal: No abdominal pain. States nausea vomiting diarrhea.  No constipation. Genitourinary: Negative for  dysuria. Musculoskeletal: Negative for back pain. Skin: Negative for rash. Neurological: Negative for headaches, focal weakness or numbness. Allergic/Immunilogical: Amoxicillin 10-point ROS otherwise negative.  ____________________________________________   PHYSICAL EXAM:  VITAL SIGNS: ED Triage Vitals  Enc Vitals Group     BP 03/02/15 1326 118/64 mmHg     Pulse Rate 03/02/15 1326 84     Resp 03/02/15 1326 16     Temp 03/02/15 1326 98.1 F (36.7 C)     Temp Source 03/02/15 1326 Oral     SpO2 03/02/15 1326 97 %     Weight 03/02/15 1326 165 lb (74.844 kg)     Height 03/02/15 1326 5\' 4"  (1.626 m)     Head Cir --      Peak Flow --      Pain Score 03/02/15 1330 8     Pain Loc --      Pain Edu? --      Excl. in GC? --     Constitutional: Alert and oriented. Well appearing and in no acute distress. Eyes: Conjunctivae are normal. PERRL. EOMI. Head: Atraumatic. Nose: Edematous nasal turbinates with clear rhinorrhea. Mouth/Throat: Mucous membranes are moist.  Oropharynx non-erythematous. Neck: No stridor.  No cervical spine tenderness to palpation. Hematological/Lymphatic/Immunilogical: No cervical lymphadenopathy. Cardiovascular: Normal rate, regular rhythm. Grossly normal heart sounds.  Good peripheral circulation. Respiratory: Normal respiratory effort.  No retractions. Lungs CTAB. Gastrointestinal: Soft and nontender. No distention. No abdominal bruits. No CVA tenderness. Musculoskeletal: No lower extremity tenderness nor edema.  No joint effusions. Neurologic:  Normal speech and language. No gross  focal neurologic deficits are appreciated. No gait instability. Skin:  Skin is warm, dry and intact. No rash noted. Psychiatric: Mood and affect are normal. Speech and behavior are normal.  ____________________________________________   LABS (all labs ordered are listed, but only abnormal results are displayed)  Labs Reviewed  BASIC METABOLIC PANEL - Abnormal; Notable for the  following:    Chloride 100 (*)    All other components within normal limits  CBC  TROPONIN I   ____________________________________________  EKG  Sinus rhythm with short PR interval. ____________________________________________  RADIOLOGY   ____________________________________________   PROCEDURES  Procedure(s) performed: None  Critical Care performed: No  ____________________________________________   INITIAL IMPRESSION / ASSESSMENT AND PLAN / ED COURSE  Pertinent labs & imaging results that were available during my care of the patient were reviewed by me and considered in my medical decision making (see chart for details).  Viral illness. Discussed lab results the patient was often normal limits. Patient advised caution treatments are limited secondary to breast-feeding. This patient is a bacterial illness and further antibodies will not resolve her complaints. Patient was discharged with Zofran and advised over-the-counter Benadryl for  Rhinitis. Advised patient to continue her treatment) Zithromax. Advised patient to follow-up with family doctor and return to ER if condition worsens. ____________________________________________   FINAL CLINICAL IMPRESSION(S) / ED DIAGNOSES  Final diagnoses:  Viral illness      Joni Reining, PA-C 03/02/15 1525  Phineas Semen, MD 03/02/15 [id number]

## 2015-03-02 NOTE — ED Notes (Signed)
No sob or distress noted, pts husband at bedside, pt is holding newborn baby.

## 2015-03-02 NOTE — ED Notes (Addendum)
Pt reports fevers, chest pain, vomiting, diarrhea, dizziness for the last 4 days, pt reports testing positive for strep on Tuesday currently taking Zithromax

## 2015-04-15 ENCOUNTER — Ambulatory Visit
Admission: EM | Admit: 2015-04-15 | Discharge: 2015-04-15 | Disposition: A | Discharge status: Home / Self Care | Payer: Medicaid Other | Attending: Family Medicine | Admitting: Family Medicine

## 2015-04-15 ENCOUNTER — Encounter: Payer: Self-pay | Admitting: Gynecology

## 2015-04-15 DIAGNOSIS — H6593 Unspecified nonsuppurative otitis media, bilateral: Secondary | ICD-10-CM | POA: Diagnosis not present

## 2015-04-15 DIAGNOSIS — J0100 Acute maxillary sinusitis, unspecified: Secondary | ICD-10-CM | POA: Diagnosis not present

## 2015-04-15 DIAGNOSIS — J029 Acute pharyngitis, unspecified: Secondary | ICD-10-CM | POA: Insufficient documentation

## 2015-04-15 DIAGNOSIS — J302 Other seasonal allergic rhinitis: Secondary | ICD-10-CM | POA: Diagnosis not present

## 2015-04-15 DIAGNOSIS — K529 Noninfective gastroenteritis and colitis, unspecified: Secondary | ICD-10-CM | POA: Diagnosis not present

## 2015-04-15 HISTORY — DX: Depression, unspecified: F32.A

## 2015-04-15 HISTORY — DX: Major depressive disorder, single episode, unspecified: F32.9

## 2015-04-15 HISTORY — DX: Anxiety disorder, unspecified: F41.9

## 2015-04-15 LAB — RAPID STREP SCREEN (MED CTR MEBANE ONLY): STREPTOCOCCUS, GROUP A SCREEN (DIRECT): NEGATIVE

## 2015-04-15 MED ORDER — FLUTICASONE PROPIONATE 50 MCG/ACT NA SUSP: 1.0000 | Freq: Two times a day (BID) | NASAL | Status: DC

## 2015-04-15 MED ORDER — ACETAMINOPHEN 500 MG PO TABS: 1000.0000 mg | ORAL_TABLET | Freq: Four times a day (QID) | ORAL | Status: DC | PRN

## 2015-04-15 MED ORDER — SALINE SPRAY 0.65 % NA SOLN: 2.0000 | NASAL | Status: DC

## 2015-04-15 MED ORDER — AZITHROMYCIN 250 MG PO TABS: 250.0000 mg | ORAL_TABLET | Freq: Every day | ORAL | Status: DC

## 2015-04-15 MED ORDER — ONDANSETRON 4 MG PO TBDP: 4.0000 mg | ORAL_TABLET | Freq: Two times a day (BID) | ORAL | Status: DC

## 2015-04-15 NOTE — Discharge Instructions (Signed)
Allergic Rhinitis Allergic rhinitis is when the mucous membranes in the nose respond to allergens. Allergens are particles in the air that cause your body to have an allergic reaction. This causes you to release allergic antibodies. Through a chain of events, these eventually cause you to release histamine into the blood stream. Although meant to protect the body, it is this release of histamine that causes your discomfort, such as frequent sneezing, congestion, and an itchy, runny nose.  CAUSES  Seasonal allergic rhinitis (hay fever) is caused by pollen allergens that may come from grasses, trees, and weeds. Year-round allergic rhinitis (perennial allergic rhinitis) is caused by allergens such as house dust mites, pet dander, and mold spores.  SYMPTOMS   Nasal stuffiness (congestion).  Itchy, runny nose with sneezing and tearing of the eyes. DIAGNOSIS  Your health care provider can help you determine the allergen or allergens that trigger your symptoms. If you and your health care provider are unable to determine the allergen, skin or blood testing may be used. TREATMENT  Allergic rhinitis does not have a cure, but it can be controlled by:  Medicines and allergy shots (immunotherapy).  Avoiding the allergen. Hay fever may often be treated with antihistamines in pill or nasal spray forms. Antihistamines block the effects of histamine. There are over-the-counter medicines that may help with nasal congestion and swelling around the eyes. Check with your health care provider before taking or giving this medicine.  If avoiding the allergen or the medicine prescribed do not work, there are many new medicines your health care provider can prescribe. Stronger medicine may be used if initial measures are ineffective. Desensitizing injections can be used if medicine and avoidance does not work. Desensitization is when a patient is given ongoing shots until the body becomes less sensitive to the allergen.  Make sure you follow up with your health care provider if problems continue. HOME CARE INSTRUCTIONS It is not possible to completely avoid allergens, but you can reduce your symptoms by taking steps to limit your exposure to them. It helps to know exactly what you are allergic to so that you can avoid your specific triggers. SEEK MEDICAL CARE IF:   You have a fever.  You develop a cough that does not stop easily (persistent).  You have shortness of breath.  You start wheezing.  Symptoms interfere with normal daily activities. Document Released: 04/16/2001 Document Revised: 07/27/2013 Document Reviewed: 03/29/2013 [facility] Patient Information 2015 [place], [place]. This information is not intended to replace advice given to you by your health care provider. Make sure you discuss any questions you have with your health care provider. Viral Gastroenteritis Viral gastroenteritis is also known as stomach flu. This condition affects the stomach and intestinal tract. It can cause sudden diarrhea and vomiting. The illness typically lasts 3 to 8 days. Most people develop an immune response that eventually gets rid of the virus. While this natural response develops, the virus can make you quite ill. CAUSES  Many different viruses can cause gastroenteritis, such as rotavirus or noroviruses. You can catch one of these viruses by consuming contaminated food or water. You may also catch a virus by sharing utensils or other personal items with an infected person or by touching a contaminated surface. SYMPTOMS  The most common symptoms are diarrhea and vomiting. These problems can cause a severe loss of body fluids (dehydration) and a body salt (electrolyte) imbalance. Other symptoms may include:  Fever.  Headache.  Fatigue.  Abdominal pain. DIAGNOSIS  Your caregiver can usually diagnose viral gastroenteritis based on your symptoms and a physical exam. A stool sample may also be taken to test for the  presence of viruses or other infections. TREATMENT  This illness typically goes away on its own. Treatments are aimed at rehydration. The most serious cases of viral gastroenteritis involve vomiting so severely that you are not able to keep fluids down. In these cases, fluids must be given through an intravenous line (IV). HOME CARE INSTRUCTIONS   Drink enough fluids to keep your urine clear or pale yellow. Drink small amounts of fluids frequently and increase the amounts as tolerated.  Ask your caregiver for specific rehydration instructions.  Avoid:  Foods high in sugar.  Alcohol.  Carbonated drinks.  Tobacco.  Juice.  Caffeine drinks.  Extremely hot or cold fluids.  Fatty, greasy foods.  Too much intake of anything at one time.  Dairy products until 24 to 48 hours after diarrhea stops.  You may consume probiotics. Probiotics are active cultures of beneficial bacteria. They may lessen the amount and number of diarrheal stools in adults. Probiotics can be found in yogurt with active cultures and in supplements.  Wash your hands well to avoid spreading the virus.  Only take over-the-counter or prescription medicines for pain, discomfort, or fever as directed by your caregiver. Do not give aspirin to children. Antidiarrheal medicines are not recommended.  Ask your caregiver if you should continue to take your regular prescribed and over-the-counter medicines.  Keep all follow-up appointments as directed by your caregiver. SEEK IMMEDIATE MEDICAL CARE IF:   You are unable to keep fluids down.  You do not urinate at least once every 6 to 8 hours.  You develop shortness of breath.  You notice blood in your stool or vomit. This may look like coffee grounds.  You have abdominal pain that increases or is concentrated in one small area (localized).  You have persistent vomiting or diarrhea.  You have a fever.  The patient is a child younger than 3 months, and he or she  has a fever.  The patient is a child older than 3 months, and he or she has a fever and persistent symptoms.  The patient is a child older than 3 months, and he or she has a fever and symptoms suddenly get worse.  The patient is a baby, and he or she has no tears when crying. MAKE SURE YOU:   Understand these instructions.  Will watch your condition.  Will get help right away if you are not doing well or get worse. Document Released: 07/22/2005 Document Revised: 10/14/2011 Document Reviewed: 05/08/2011 [facility] Patient Information 2015 [place], [place]. This information is not intended to replace advice given to you by your health care provider. Make sure you discuss any questions you have with your health care provider. Otitis Media With Effusion Otitis media with effusion is the presence of fluid in the middle ear. This is a common problem in children, which often follows ear infections. It may be present for weeks or longer after the infection. Unlike an acute ear infection, otitis media with effusion refers only to fluid behind the ear drum and not infection. Children with repeated ear and sinus infections and allergy problems are the most likely to get otitis media with effusion. CAUSES  The most frequent cause of the fluid buildup is dysfunction of the eustachian tubes. These are the tubes that drain fluid in the ears to the back of the nose (nasopharynx).  SYMPTOMS   The main symptom of this condition is hearing loss. As a result, you or your child may:  Listen to the TV at a loud volume.  Not respond to questions.  Ask "what" often when spoken to.  Mistake or confuse one sound or word for another.  There may be a sensation of fullness or pressure but usually not pain. DIAGNOSIS   Your health care provider will diagnose this condition by examining you or your child's ears.  Your health care provider may test the pressure in you or your child's ear with a tympanometer.  A  hearing test may be conducted if the problem persists. TREATMENT   Treatment depends on the duration and the effects of the effusion.  Antibiotics, decongestants, nose drops, and cortisone-type drugs (tablets or nasal spray) may not be helpful.  Children with persistent ear effusions may have delayed language or behavioral problems. Children at risk for developmental delays in hearing, learning, and speech may require referral to a specialist earlier than children not at risk.  You or your child's health care provider may suggest a referral to an ear, nose, and throat surgeon for treatment. The following may help restore normal hearing:  Drainage of fluid.  Placement of ear tubes (tympanostomy tubes).  Removal of adenoids (adenoidectomy). HOME CARE INSTRUCTIONS   Avoid secondhand smoke.  Infants who are breastfed are less likely to have this condition.  Avoid feeding infants while they are lying flat.  Avoid known environmental allergens.  Avoid people who are sick. SEEK MEDICAL CARE IF:   Hearing is not better in 3 months.  Hearing is worse.  Ear pain.  Drainage from the ear.  Dizziness. MAKE SURE YOU:   Understand these instructions.  Will watch your condition.  Will get help right away if you are not doing well or get worse. Document Released: 08/29/2004 Document Revised: 12/06/2013 Document Reviewed: 02/16/2013 [facility] Patient Information 2015 [place], [place]. This information is not intended to replace advice given to you by your health care provider. Make sure you discuss any questions you have with your health care provider. Pharyngitis Pharyngitis is redness, pain, and swelling (inflammation) of your pharynx.  CAUSES  Pharyngitis is usually caused by infection. Most of the time, these infections are from viruses (viral) and are part of a cold. However, sometimes pharyngitis is caused by bacteria (bacterial). Pharyngitis can also be caused by allergies. Viral  pharyngitis may be spread from person to person by coughing, sneezing, and personal items or utensils (cups, forks, spoons, toothbrushes). Bacterial pharyngitis may be spread from person to person by more intimate contact, such as kissing.  SIGNS AND SYMPTOMS  Symptoms of pharyngitis include:   Sore throat.   Tiredness (fatigue).   Low-grade fever.   Headache.  Joint pain and muscle aches.  Skin rashes.  Swollen lymph nodes.  Plaque-like film on throat or tonsils (often seen with bacterial pharyngitis). DIAGNOSIS  Your health care provider will ask you questions about your illness and your symptoms. Your medical history, along with a physical exam, is often all that is needed to diagnose pharyngitis. Sometimes, a rapid strep test is done. Other lab tests may also be done, depending on the suspected cause.  TREATMENT  Viral pharyngitis will usually get better in 3-4 days without the use of medicine. Bacterial pharyngitis is treated with medicines that kill germs (antibiotics).  HOME CARE INSTRUCTIONS   Drink enough water and fluids to keep your urine clear or pale yellow.  Only take over-the-counter or prescription medicines as directed by your health care provider:   If you are prescribed antibiotics, make sure you finish them even if you start to feel better.   Do not take aspirin.   Get lots of rest.   Gargle with 8 oz of salt water ( tsp of salt per 1 qt of water) as often as every 1-2 hours to soothe your throat.   Throat lozenges (if you are not at risk for choking) or sprays may be used to soothe your throat. SEEK MEDICAL CARE IF:   You have large, tender lumps in your neck.  You have a rash.  You cough up green, yellow-brown, or bloody spit. SEEK IMMEDIATE MEDICAL CARE IF:   Your neck becomes stiff.  You drool or are unable to swallow liquids.  You vomit or are unable to keep medicines or liquids down.  You have severe pain that does not go away  with the use of recommended medicines.  You have trouble breathing (not caused by a stuffy nose). MAKE SURE YOU:   Understand these instructions.  Will watch your condition.  Will get help right away if you are not doing well or get worse. Document Released: 07/22/2005 Document Revised: 05/12/2013 Document Reviewed: 03/29/2013 [facility] Patient Information 2015 [place], [place]. This information is not intended to replace advice given to you by your health care provider. Make sure you discuss any questions you have with your health care provider. Sinusitis Sinusitis is redness, soreness, and inflammation of the paranasal sinuses. Paranasal sinuses are air pockets within the bones of your face (beneath the eyes, the middle of the forehead, or above the eyes). In healthy paranasal sinuses, mucus is able to drain out, and air is able to circulate through them by way of your nose. However, when your paranasal sinuses are inflamed, mucus and air can become trapped. This can allow bacteria and other germs to grow and cause infection. Sinusitis can develop quickly and last only a short time (acute) or continue over a long period (chronic). Sinusitis that lasts for more than 12 weeks is considered chronic.  CAUSES  Causes of sinusitis include:  Allergies.  Structural abnormalities, such as displacement of the cartilage that separates your nostrils (deviated septum), which can decrease the air flow through your nose and sinuses and affect sinus drainage.  Functional abnormalities, such as when the small hairs (cilia) that line your sinuses and help remove mucus do not work properly or are not present. SIGNS AND SYMPTOMS  Symptoms of acute and chronic sinusitis are the same. The primary symptoms are pain and pressure around the affected sinuses. Other symptoms include:  Upper toothache.  Earache.  Headache.  Bad breath.  Decreased sense of smell and taste.  A cough, which worsens when you are  lying flat.  Fatigue.  Fever.  Thick drainage from your nose, which often is green and may contain pus (purulent).  Swelling and warmth over the affected sinuses. DIAGNOSIS  Your health care provider will perform a physical exam. During the exam, your health care provider may:  Look in your nose for signs of abnormal growths in your nostrils (nasal polyps).  Tap over the affected sinus to check for signs of infection.  View the inside of your sinuses (endoscopy) using an imaging device that has a light attached (endoscope). If your health care provider suspects that you have chronic sinusitis, one or more of the following tests may be recommended:  Allergy tests.  Nasal culture. A sample of mucus is taken from your nose, sent to a lab, and screened for bacteria.  Nasal cytology. A sample of mucus is taken from your nose and examined by your health care provider to determine if your sinusitis is related to an allergy. TREATMENT  Most cases of acute sinusitis are related to a viral infection and will resolve on their own within 10 days. Sometimes medicines are prescribed to help relieve symptoms (pain medicine, decongestants, nasal steroid sprays, or saline sprays).  However, for sinusitis related to a bacterial infection, your health care provider will prescribe antibiotic medicines. These are medicines that will help kill the bacteria causing the infection.  Rarely, sinusitis is caused by a fungal infection. In theses cases, your health care provider will prescribe antifungal medicine. For some cases of chronic sinusitis, surgery is needed. Generally, these are cases in which sinusitis recurs more than 3 times per year, despite other treatments. HOME CARE INSTRUCTIONS   Drink plenty of water. Water helps thin the mucus so your sinuses can drain more easily.  Use a humidifier.  Inhale steam 3 to 4 times a day (for example, sit in the bathroom with the shower running).  Apply a  warm, moist washcloth to your face 3 to 4 times a day, or as directed by your health care provider.  Use saline nasal sprays to help moisten and clean your sinuses.  Take medicines only as directed by your health care provider.  If you were prescribed either an antibiotic or antifungal medicine, finish it all even if you start to feel better. SEEK IMMEDIATE MEDICAL CARE IF:  You have increasing pain or severe headaches.  You have nausea, vomiting, or drowsiness.  You have swelling around your face.  You have vision problems.  You have a stiff neck.  You have difficulty breathing. MAKE SURE YOU:   Understand these instructions.  Will watch your condition.  Will get help right away if you are not doing well or get worse. Document Released: 07/22/2005 Document Revised: 12/06/2013 Document Reviewed: 08/06/2011 [facility] Patient Information 2015 [place], [place]. This information is not intended to replace advice given to you by your health care provider. Make sure you discuss any questions you have with your health care provider.

## 2015-04-15 NOTE — ED Notes (Signed)
Patient c/o sore throat x 2 days ago. Per pt. Painful to swallow and discomfort while breathing. Pt. Stated had strep x 1 month ago and believe she has it again.

## 2015-04-16 NOTE — ED Provider Notes (Signed)
CSN: [id number]     Arrival date & time 04/15/15  0915 History   First MD Initiated Contact with Patient 04/15/15 [phone]     Chief Complaint  Patient presents with  . Sore Throat   (Consider location/radiation/quality/duration/timing/severity/associated sxs/prior Treatment) HPI Comments: Caucasian female here with infant for evaluation of sore throat.  Breastfeeding.  Some chest discomfort with cough/deep breaths, nasal congestion, sore throat.  Had strep throat 1 month ago.  Seasonal allergies but not taking anything right now doesn't want to decrease milk production.  Requested refill on her zofran for nausea.  The history is provided by the patient.    Past Medical History  Diagnosis Date  . Depression   . Anxiety    History reviewed. No pertinent past surgical history. History reviewed. No pertinent family history. Social History  Substance Use Topics  . Smoking status: Never Smoker   . Smokeless tobacco: None  . Alcohol Use: No   OB History    No data available     Review of Systems  Constitutional: Positive for chills and fatigue. Negative for fever, diaphoresis, activity change and appetite change.  HENT: Positive for congestion, postnasal drip, rhinorrhea, sinus pressure, sneezing and sore throat. Negative for dental problem, drooling, ear discharge, ear pain, facial swelling, hearing loss, mouth sores, nosebleeds, tinnitus, trouble swallowing and voice change.   Eyes: Negative for photophobia, pain, discharge, redness, itching and visual disturbance.  Respiratory: Positive for cough. Negative for choking, chest tightness, shortness of breath, wheezing and stridor.   Cardiovascular: Positive for chest pain. Negative for palpitations and leg swelling.  Gastrointestinal: Positive for nausea. Negative for vomiting, abdominal pain, diarrhea, constipation, blood in stool and abdominal distention.  Endocrine: Positive for cold intolerance and heat intolerance. Negative for  polydipsia, polyphagia and polyuria.  Genitourinary: Negative for dysuria.  Musculoskeletal: Negative for myalgias, back pain, joint swelling, arthralgias, gait problem, neck pain and neck stiffness.  Skin: Negative for color change, pallor, rash and wound.  Allergic/Immunologic: Positive for environmental allergies. Negative for food allergies.  Neurological: Negative for dizziness, tremors, seizures, syncope, facial asymmetry, speech difficulty, weakness, light-headedness, numbness and headaches.  Hematological: Negative for adenopathy. Does not bruise/bleed easily.  Psychiatric/Behavioral: Positive for sleep disturbance. Negative for behavioral problems, confusion and agitation.    Allergies  Amoxicillin  Home Medications   Prior to Admission medications   Medication Sig Start Date End Date Taking? Authorizing Provider  citalopram (CELEXA) 20 MG tablet Take 20 mg by mouth daily.   Yes Historical Provider, MD  cloNIDine (CATAPRES) 0.1 MG tablet Take 0.1 mg by mouth 2 (two) times daily.   Yes Historical Provider, MD  acetaminophen (TYLENOL) 500 MG tablet Take 2 tablets (1,000 mg total) by mouth every 6 (six) hours as needed. 04/15/15   Barbaraann Barthel, NP  azithromycin (ZITHROMAX) 250 MG tablet Take 1 tablet (250 mg total) by mouth daily. Take first 2 tablets together, then 1 every day until finished. 04/15/15   Barbaraann Barthel, NP  fluticasone (FLONASE) 50 MCG/ACT nasal spray Place 1 spray into both nostrils 2 (two) times daily. 04/15/15   Barbaraann Barthel, NP  ondansetron (ZOFRAN-ODT) 4 MG disintegrating tablet Take 1 tablet (4 mg total) by mouth 2 (two) times daily. 04/15/15   Barbaraann Barthel, NP  sodium chloride (OCEAN) 0.65 % SOLN nasal spray Place 2 sprays into both nostrils every 2 (two) hours while awake. 04/15/15   Barbaraann Barthel, NP   Meds Ordered and Administered this Visit  Medications - No data to display  BP 103/65 mmHg  Pulse 62  Temp(Src) 98 F (36.7 C) (Oral)   Resp 16  Ht 5\' 5"  (1.651 m)  Wt 165 lb (74.844 kg)  BMI 27.46 kg/m2  SpO2 97%  LMP 04/07/2015 No data found.   Physical Exam  Constitutional: She is oriented to person, place, and time. Vital signs are normal. She appears well-developed and well-nourished. No distress.  HENT:  Head: Normocephalic and atraumatic.  Right Ear: Hearing, external ear and ear canal normal. A middle ear effusion is present.  Left Ear: Hearing, external ear and ear canal normal. A middle ear effusion is present.  Nose: Mucosal edema and rhinorrhea present. No nose lacerations, sinus tenderness, nasal deformity, septal deviation or nasal septal hematoma. No epistaxis.  No foreign bodies. Right sinus exhibits maxillary sinus tenderness. Right sinus exhibits no frontal sinus tenderness. Left sinus exhibits maxillary sinus tenderness. Left sinus exhibits no frontal sinus tenderness.  Mouth/Throat: Uvula is midline and mucous membranes are normal. Mucous membranes are not pale, not dry and not cyanotic. She does not have dentures. No oral lesions. No trismus in the jaw. Normal dentition. No dental abscesses, uvula swelling, lacerations or dental caries. Posterior oropharyngeal edema and posterior oropharyngeal erythema present. No oropharyngeal exudate or tonsillar abscesses.  Cobblestoning posterior pharynx; bilateral TMs with air fluid level; bilateral turbinates with edema/erythema clear discharge  Eyes: Conjunctivae, EOM and lids are normal. Pupils are equal, round, and reactive to light. Right eye exhibits no discharge. Left eye exhibits no discharge. No scleral icterus.  Neck: Trachea normal and normal range of motion. Neck supple. No tracheal deviation present. No thyromegaly present.  Cardiovascular: Normal rate, regular rhythm, S1 normal, S2 normal, normal heart sounds and intact distal pulses.  PMI is not displaced.  Exam reveals no gallop and no friction rub.   No murmur heard. Pulmonary/Chest: Effort normal and  breath sounds normal. No accessory muscle usage or stridor. No respiratory distress. She has no decreased breath sounds. She has no wheezes. She has no rhonchi. She has no rales. She exhibits no tenderness.  Abdominal: Soft. Bowel sounds are normal. She exhibits no shifting dullness, no distension, no pulsatile liver, no fluid wave, no abdominal bruit, no ascites, no pulsatile midline mass and no mass. There is no hepatosplenomegaly. There is no tenderness. There is no rigidity, no rebound, no guarding, no CVA tenderness, no tenderness at McBurney's point and negative Murphy's sign. Hernia confirmed negative in the ventral area.  Dull to percussion x 4 quads  Musculoskeletal: Normal range of motion. She exhibits no edema or tenderness.  Lymphadenopathy:    She has no cervical adenopathy.  Neurological: She is alert and oriented to person, place, and time. She exhibits normal muscle tone. Coordination normal.  Skin: Skin is warm, dry and intact. No abrasion, no bruising, no burn, no ecchymosis, no laceration, no lesion, no petechiae and no rash noted. She is not diaphoretic. No cyanosis or erythema. No pallor. Nails show no clubbing.  Psychiatric: She has a normal mood and affect. Her speech is normal and behavior is normal. Judgment and thought content normal. Cognition and memory are normal.  Nursing note and vitals reviewed.   ED Course  Procedures (including critical care time)  Labs Review Labs Reviewed  RAPID STREP SCREEN (NOT AT [facility])  CULTURE, GROUP A STREP ([facility] ONLY)    Imaging Review No results found.   17 Apr 2015 at 1148 contacted patient via telephone and notified throat  culture negative/normal.  Patient reported sinus pain worsening along with sore throat despite flonase and azithromycin.  Still congested, post nasal drip.  Discussed with patient continue with medications as prescribed and if no improvement with third dose today consider starting ceftin 250mg  po BID x 10 days.   Rx sent to [facility] [place], [place]  Discussed no controlled trials but no known adverse effects with breast feeding.  Patient verbalized understanding of information/instructions, agreed with plan of care and had no further questions at this time.  MDM   1. Other seasonal allergic rhinitis   2. Acute pharyngitis, unspecified pharyngitis type   3. Otitis media with effusion, bilateral   4. Acute maxillary sinusitis, recurrence not specified   5. Gastroenteritis, acute    Patient may use normal saline nasal spray as needed.  Consider antihistamine or nasal steroid use.  Discussed no known adverse effects with breastfeeding and nasal steroids but no RCTs have been done.  Less risk than oral steroids.  Discussed antihistamine use and patient refused due to breastfeeding does not want to decrease milk production.  Avoid triggers if possible.  Shower prior to bedtime if exposed to triggers.  If allergic dust/dust mites recommend mattress/pillow covers/encasements; washing linens, vacuuming, sweeping, dusting weekly.  Call or return to clinic as needed if these symptoms worsen or fail to improve as anticipated.   Exitcare handout on allergic rhinitis given to patient. Mother and Patient verbalized understanding of instructions, agreed with plan of care and had no further questions at this time.  P2:  Avoidance and hand washing.  Rapid strep negative.  Throat culture pending typically 48 hours will call with results once available.  Given azithromycin Rx if worsening throat pain, pus on tonsils or called that throat culture positive to start medication.  Usually no specific medical treatment is needed if a virus is causing the sore throat.  The throat most often gets better on its own within 5 to 7 days.  Antibiotic medicine does not cure viral pharyngitis.   For acute pharyngitis caused by bacteria, your healthcare provider will prescribe an antibiotic.  Marland Kitchen Do not smoke.  Marland Kitchen Avoid secondhand smoke and other air  pollutants.  . Use a cool mist humidifier to add moisture to the air.  . Get plenty of rest.  . You may want to rest your throat by talking less and eating a diet that is mostly liquid or soft for a day or two.   Marland Kitchen Nonprescription throat lozenges and mouthwashes should help relieve the soreness.   . Gargling with warm saltwater and drinking warm liquids may help.  (You can make a saltwater solution by adding 1/4 teaspoon of salt to 8 ounces, or 240 mL, of warm water.)  . A nonprescription pain reliever such as aspirin, acetaminophen, or ibuprofen may ease general aches and pains.   Tylenol 1000mg  po QID prn pain Rx given. FOLLOW UP with clinic provider if no improvements in the next 7-10 days.  Mother and Patient verbalized understanding of instructions and agreed with plan of care. P2:  Hand washing and diet.  Supportive treatment.   No evidence of invasive bacterial infection, non toxic and well hydrated.  This is most likely self limiting viral infection.  I do not see where any further testing or imaging is necessary at this time.   I will suggest supportive care, rest, good hygiene and encourage the patient to take adequate fluids.  The patient is to return to clinic or EMERGENCY ROOM  if symptoms worsen or change significantly e.g. ear pain, fever, purulent discharge from ears or bleeding.  Exitcare handout on otitis media with effusion given to patient.  Mother and Patient verbalized agreement and understanding of treatment plan.    No evidence of systemic bacterial infection, non toxic and well hydrated.  I do not see where any further testing or imaging is necessary at this time.   I will suggest supportive care, rest, good hygiene and encourage the patient to take adequate fluids.  The patient is to return to clinic or EMERGENCY ROOM if symptoms worsen or change significantly.  Exitcare handout on sinusitis given to patient.  Mother and Patient verbalized agreement and understanding of treatment  plan and had no further questions at this time.   P2:  Hand washing and cover cough  Refilled zofran 4mg  ODT po BID prn nausea/vomiting for patient.I have recommended clear fluids and the BRAT diet.  Medications as directed.  Return to the clinic if  symptoms persist or worsen; I have alerted the patient to call if high fever, unable to urinate every 8 hours, dehydration, marked weakness, fainting, increased abdominal pain, blood in stool or vomit.  Exitcare handout on gastroenteritis given to patient. Mother and  Patient verbalized agreement and understanding of treatment plan and had no further questions at this time.   P2:  Hand washing and fitness  Barbaraann Barthel, NP 04/17/15 1316

## 2015-04-17 LAB — CULTURE, GROUP A STREP (THRC)

## 2015-04-17 MED ORDER — CEFUROXIME AXETIL 250 MG PO TABS: 250.0000 mg | ORAL_TABLET | Freq: Two times a day (BID) | ORAL | Status: DC

## 2015-10-02 ENCOUNTER — Emergency Department
Admission: EM | Admit: 2015-10-02 | Discharge: 2015-10-02 | Disposition: A | Discharge status: Home / Self Care | Payer: Medicaid Other | Attending: Emergency Medicine | Admitting: Emergency Medicine

## 2015-10-02 ENCOUNTER — Emergency Department: Payer: Medicaid Other

## 2015-10-02 DIAGNOSIS — Y9389 Activity, other specified: Secondary | ICD-10-CM | POA: Insufficient documentation

## 2015-10-02 DIAGNOSIS — S8391XA Sprain of unspecified site of right knee, initial encounter: Secondary | ICD-10-CM | POA: Insufficient documentation

## 2015-10-02 DIAGNOSIS — Y998 Other external cause status: Secondary | ICD-10-CM | POA: Diagnosis not present

## 2015-10-02 DIAGNOSIS — M25561 Pain in right knee: Secondary | ICD-10-CM

## 2015-10-02 DIAGNOSIS — Z792 Long term (current) use of antibiotics: Secondary | ICD-10-CM | POA: Insufficient documentation

## 2015-10-02 DIAGNOSIS — Z880 Allergy status to penicillin: Secondary | ICD-10-CM | POA: Diagnosis not present

## 2015-10-02 DIAGNOSIS — Z7951 Long term (current) use of inhaled steroids: Secondary | ICD-10-CM | POA: Insufficient documentation

## 2015-10-02 DIAGNOSIS — Y9289 Other specified places as the place of occurrence of the external cause: Secondary | ICD-10-CM | POA: Diagnosis not present

## 2015-10-02 DIAGNOSIS — W541XXA Struck by dog, initial encounter: Secondary | ICD-10-CM | POA: Insufficient documentation

## 2015-10-02 DIAGNOSIS — Z79899 Other long term (current) drug therapy: Secondary | ICD-10-CM | POA: Insufficient documentation

## 2015-10-02 DIAGNOSIS — S8991XA Unspecified injury of right lower leg, initial encounter: Secondary | ICD-10-CM | POA: Diagnosis present

## 2015-10-02 MED ORDER — OXYCODONE-ACETAMINOPHEN 5-325 MG PO TABS
2.0000 | ORAL_TABLET | Freq: Once | ORAL | Status: AC
  Administered 2015-10-02: 2 via ORAL
  Filled 2015-10-02: qty 2

## 2015-10-02 MED ORDER — ETODOLAC 200 MG PO CAPS: 200.0000 mg | ORAL_CAPSULE | Freq: Three times a day (TID) | ORAL | Status: DC

## 2015-10-02 NOTE — Discharge Instructions (Signed)
Joint Pain Joint pain, which is also called arthralgia, can be caused by many things. Joint pain often goes away when you follow your health care provider's instructions for relieving pain at home. However, joint pain can also be caused by conditions that require further treatment. Common causes of joint pain include:  Bruising in the area of the joint.  Overuse of the joint.  Wear and tear on the joints that occur with aging (osteoarthritis).  Various other forms of arthritis.  A buildup of a crystal form of uric acid in the joint (gout).  Infections of the joint (septic arthritis) or of the bone (osteomyelitis). Your health care provider may recommend medicine to help with the pain. If your joint pain continues, additional tests may be needed to diagnose your condition. HOME CARE INSTRUCTIONS Watch your condition for any changes. Follow these instructions as directed to lessen the pain that you are feeling.  Take medicines only as directed by your health care provider.  Rest the affected area for as long as your health care provider says that you should. If directed to do so, raise the painful joint above the level of your heart while you are sitting or lying down.  Do not do things that cause or worsen pain.  If directed, apply ice to the painful area:  Put ice in a plastic bag.  Place a towel between your skin and the bag.  Leave the ice on for 20 minutes, 2-3 times per day.  Wear an elastic bandage, splint, or sling as directed by your health care provider. Loosen the elastic bandage or splint if your fingers or toes become numb and tingle, or if they turn cold and blue.  Begin exercising or stretching the affected area as directed by your health care provider. Ask your health care provider what types of exercise are safe for you.  Keep all follow-up visits as directed by your health care provider. This is important. SEEK MEDICAL CARE IF:  Your pain increases, and medicine  does not help.  Your joint pain does not improve within 3 days.  You have increased bruising or swelling.  You have a fever.  You lose 10 lb (4.5 kg) or more without trying. SEEK IMMEDIATE MEDICAL CARE IF:  You are not able to move the joint.  Your fingers or toes become numb or they turn cold and blue.   This information is not intended to replace advice given to you by your health care provider. Make sure you discuss any questions you have with your health care provider.   Document Released: 07/22/2005 Document Revised: 08/12/2014 Document Reviewed: 05/03/2014 Elsevier Interactive Patient Education 2016 [employer]  Knee Pain Knee pain is a very common symptom and can have many causes. Knee pain often goes away when you follow your health care provider's instructions for relieving pain and discomfort at home. However, knee pain can develop into a condition that needs treatment. Some conditions may include:  Arthritis caused by wear and tear (osteoarthritis).  Arthritis caused by swelling and irritation (rheumatoid arthritis or gout).  A cyst or growth in your knee.  An infection in your knee joint.  An injury that will not heal.  Damage, swelling, or irritation of the tissues that support your knee (torn ligaments or tendinitis). If your knee pain continues, additional tests may be ordered to diagnose your condition. Tests may include X-rays or other imaging studies of your knee. You may also need to have fluid removed from your  knee. Treatment for ongoing knee pain depends on the cause, but treatment may include:  Medicines to relieve pain or swelling.  Steroid injections in your knee.  Physical therapy.  Surgery. HOME CARE INSTRUCTIONS  Take medicines only as directed by your health care provider.  Rest your knee and keep it raised (elevated) while you are resting.  Do not do things that cause or worsen pain.  Avoid high-impact activities or exercises, such  as running, jumping rope, or doing jumping jacks.  Apply ice to the knee area:  Put ice in a plastic bag.  Place a towel between your skin and the bag.  Leave the ice on for 20 minutes, 2-3 times a day.  Ask your health care provider if you should wear an elastic knee support.  Keep a pillow under your knee when you sleep.  Lose weight if you are overweight. Extra weight can put pressure on your knee.  Do not use any tobacco products, including cigarettes, chewing tobacco, or electronic cigarettes. If you need help quitting, ask your health care provider. Smoking may slow the healing of any bone and joint problems that you may have. SEEK MEDICAL CARE IF:  Your knee pain continues, changes, or gets worse.  You have a fever along with knee pain.  Your knee buckles or locks up.  Your knee becomes more swollen. SEEK IMMEDIATE MEDICAL CARE IF:   Your knee joint feels hot to the touch.  You have chest pain or trouble breathing.   This information is not intended to replace advice given to you by your health care provider. Make sure you discuss any questions you have with your health care provider.   Document Released: 05/19/2007 Document Revised: 08/12/2014 Document Reviewed: 03/07/2014 Elsevier Interactive Patient Education 2016 [employer]  Knee Sprain A knee sprain is a tear in one of the strong, fibrous tissues that connect the bones (ligaments) in your knee. The severity of the sprain depends on how much of the ligament is torn. The tear can be either partial or complete. CAUSES  Often, sprains are a result of a fall or injury. The force of the impact causes the fibers of your ligament to stretch too much. This excess tension causes the fibers of your ligament to tear. SIGNS AND SYMPTOMS  You may have some loss of motion in your knee. Other symptoms include:  Bruising.  Pain in the knee area.  Tenderness of the knee to the touch.  Swelling. DIAGNOSIS  To diagnose  a knee sprain, your health care provider will physically examine your knee. Your health care provider may also suggest an X-ray exam of your knee to make sure no bones are broken. TREATMENT  If your ligament is only partially torn, treatment usually involves keeping the knee in a fixed position (immobilization) or bracing your knee for activities that require movement for several weeks. To do this, your health care provider will apply a bandage, cast, or splint to keep your knee from moving and to support your knee during movement until it heals. For a partially torn ligament, the healing process usually takes 4-6 weeks. If your ligament is completely torn, depending on which ligament it is, you may need surgery to reconnect the ligament to the bone or reconstruct it. After surgery, a cast or splint may be applied and will need to stay on your knee for 4-6 weeks while your ligament heals. HOME CARE INSTRUCTIONS  Keep your injured knee elevated to decrease swelling.  To  ease pain and swelling, apply ice to the injured area:  Put ice in a plastic bag.  Place a towel between your skin and the bag.  Leave the ice on for 20 minutes, 2-3 times a day.  Only take medicine for pain as directed by your health care provider.  Do not leave your knee unprotected until pain and stiffness go away (usually 4-6 weeks).  If you have a cast or splint, do not allow it to get wet. If you have been instructed not to remove it, cover it with a plastic bag when you shower or bathe. Do not swim.  Your health care provider may suggest exercises for you to do during your recovery to prevent or limit permanent weakness and stiffness. SEEK IMMEDIATE MEDICAL CARE IF:  Your cast or splint becomes damaged.  Your pain becomes worse.  You have significant pain, swelling, or numbness below the cast or splint. MAKE SURE YOU:  Understand these instructions.  Will watch your condition.  Will get help right away if you  are not doing well or get worse.   This information is not intended to replace advice given to you by your health care provider. Make sure you discuss any questions you have with your health care provider.   Document Released: 07/22/2005 Document Revised: 08/12/2014 Document Reviewed: 03/03/2013 Elsevier Interactive Patient Education [employer]

## 2015-10-02 NOTE — ED Provider Notes (Signed)
[facility] Emergency Department Provider Note  ____________________________________________  Time seen: Approximately 349 AM  I have reviewed the triage vital signs and the nursing notes.   HISTORY  Chief Complaint Knee Pain    HPI Anna Flores is a 25 y.o. female who comes into the hospital today with some right-sided knee pain. The patient reports that she was injured her right knee one week ago. She reports that her dog ran into her knee and she fell. She reports that her knee was sore and getting pain. She went saw her primary care physician 2 days after the initial injury. She was sent for an x-ray and was told that she would get the results within the day but she never received a call back. She reports that she tried to call her doctor was not in the office. She reports that since then the pain has been getting worse and worse. She says is going down into her ankle and she is unable to move her toes. She has been taking Motrin and Tylenol for her pain and did buy a knee brace from [employer] but it is still been hurting. It difficult to bend and the patient reports her pain is really bad. The patient rates her pain an 8-9 out of 10 in intensity. The patient is here for further evaluation of this knee pain.   Past Medical History  Diagnosis Date  . Depression   . Anxiety     There are no active problems to display for this patient.   No past surgical history  Current Outpatient Rx  Name  Route  Sig  Dispense  Refill  . acetaminophen (TYLENOL) 500 MG tablet   Oral   Take 2 tablets (1,000 mg total) by mouth every 6 (six) hours as needed.   30 tablet   0   . azithromycin (ZITHROMAX) 250 MG tablet   Oral   Take 1 tablet (250 mg total) by mouth daily. Take first 2 tablets together, then 1 every day until finished.   6 tablet   0   . cefUROXime (CEFTIN) 250 MG tablet   Oral   Take 1 tablet (250 mg total) by mouth 2 (two) times daily with a meal.    20 tablet   0   . citalopram (CELEXA) 20 MG tablet   Oral   Take 20 mg by mouth daily.         . cloNIDine (CATAPRES) 0.1 MG tablet   Oral   Take 0.1 mg by mouth 2 (two) times daily.         Marland Kitchen etodolac (LODINE) 200 MG capsule   Oral   Take 1 capsule (200 mg total) by mouth every 8 (eight) hours.   12 capsule   0   . fluticasone (FLONASE) 50 MCG/ACT nasal spray   Each Nare   Place 1 spray into both nostrils 2 (two) times daily.   16 g   0   . ondansetron (ZOFRAN-ODT) 4 MG disintegrating tablet   Oral   Take 1 tablet (4 mg total) by mouth 2 (two) times daily.   6 tablet   0   . sodium chloride (OCEAN) 0.65 % SOLN nasal spray   Each Nare   Place 2 sprays into both nostrils every 2 (two) hours while awake.      0     Allergies Amoxicillin and Cherry  No family history on file.  Social History Social History  Substance Use Topics  .  Smoking status: Never Smoker   . Smokeless tobacco: Not on file  . Alcohol Use: No    Review of Systems Constitutional: No fever/chills Eyes: No visual changes. ENT: No sore throat. Cardiovascular: Denies chest pain. Respiratory: Denies shortness of breath. Gastrointestinal: No abdominal pain.  No nausea, no vomiting.  No diarrhea.  No constipation. Genitourinary: Negative for dysuria. Musculoskeletal: Right knee pain Skin: Negative for rash. Neurological: Negative for headaches, focal weakness or numbness.  10-point ROS otherwise negative.  ____________________________________________   PHYSICAL EXAM:  VITAL SIGNS: ED Triage Vitals  Enc Vitals Group     BP 10/02/15 0011 125/66 mmHg     Pulse Rate 10/02/15 0011 92     Resp 10/02/15 0011 18     Temp 10/02/15 0011 98 F (36.7 C)     Temp Source 10/02/15 0011 Oral     SpO2 10/02/15 0011 100 %     Weight 10/02/15 0011 170 lb (77.111 kg)     Height 10/02/15 0011  (1.626 m)     Head Cir --      Peak Flow --      Pain Score 10/02/15 0012 9     Pain Loc --       Pain Edu? --      Excl. in GC? --     Constitutional: Alert and oriented. Well appearing and in mild to moderate distress. Eyes: Conjunctivae are normal. PERRL. EOMI. Head: Atraumatic. Nose: No congestion/rhinnorhea. Mouth/Throat: Mucous membranes are moist.  Oropharynx non-erythematous. Cardiovascular: Normal rate, regular rhythm. Grossly normal heart sounds.  Good peripheral circulation. Respiratory: Normal respiratory effort.  No retractions. Lungs CTAB. Gastrointestinal: Soft and nontender. No distention. Positive bowel sounds Musculoskeletal: No lower extremity tenderness nor edema.  Right knee tenderness to palpation with pain with passive range of motion no anterior posterior drawer sign but patient very uncomfortable with exam. Mild right knee swelling noted.  Neurologic:  Normal speech and language.  Skin:  Skin is warm, dry and intact.  Psychiatric: Mood and affect are normal.   ____________________________________________   LABS (all labs ordered are listed, but only abnormal results are displayed)  Labs Reviewed - No data to display ____________________________________________  EKG  None ____________________________________________  RADIOLOGY  Right knee x-ray: Negative ____________________________________________   PROCEDURES  Procedure(s) performed: None  Critical Care performed: No  ____________________________________________   INITIAL IMPRESSION / ASSESSMENT AND PLAN / ED COURSE  Pertinent labs & imaging results that were available during my care of the patient were reviewed by me and considered in my medical decision making (see chart for details).  The patient is 25 year old female who comes into the hospital today with some right knee pain after her dog ran into her knee. The patient does have some significant tenderness to palpation with minimal swelling. Her x-ray did not show any acute fracture but it does not exclude ligamentous injury. I  will give the patient a knee immobilizer, crutches and 2 Percocet. I will have the patient follow up with orthopedic surgery who can further evaluate her knee pain and disposition her and treat her appropriately. I discussed this with the patient and she had no further questions or concerns. ____________________________________________   FINAL CLINICAL IMPRESSION(S) / ED DIAGNOSES  Final diagnoses:  Knee pain, acute, right  Right knee sprain, initial encounter      Rebecka Apley, MD 10/02/15 [phone]

## 2015-10-02 NOTE — ED Notes (Signed)
Injured last Sunday, was seen by her dr a couple days later, had x-rays, no call back with x-ray results, pt states that the pain is getting worse and states is harder to put any weight on that leg

## 2015-12-26 ENCOUNTER — Emergency Department
Admission: EM | Admit: 2015-12-26 | Discharge: 2015-12-26 | Disposition: A | Discharge status: Home / Self Care | Payer: Medicaid Other | Attending: Emergency Medicine | Admitting: Emergency Medicine

## 2015-12-26 ENCOUNTER — Emergency Department: Payer: Medicaid Other

## 2015-12-26 ENCOUNTER — Encounter: Payer: Self-pay | Admitting: *Deleted

## 2015-12-26 DIAGNOSIS — Z79899 Other long term (current) drug therapy: Secondary | ICD-10-CM | POA: Insufficient documentation

## 2015-12-26 DIAGNOSIS — O219 Vomiting of pregnancy, unspecified: Secondary | ICD-10-CM | POA: Diagnosis not present

## 2015-12-26 DIAGNOSIS — Z792 Long term (current) use of antibiotics: Secondary | ICD-10-CM | POA: Diagnosis not present

## 2015-12-26 DIAGNOSIS — Z3A01 Less than 8 weeks gestation of pregnancy: Secondary | ICD-10-CM | POA: Diagnosis not present

## 2015-12-26 DIAGNOSIS — Z7951 Long term (current) use of inhaled steroids: Secondary | ICD-10-CM | POA: Insufficient documentation

## 2015-12-26 DIAGNOSIS — F329 Major depressive disorder, single episode, unspecified: Secondary | ICD-10-CM | POA: Diagnosis not present

## 2015-12-26 DIAGNOSIS — Z791 Long term (current) use of non-steroidal anti-inflammatories (NSAID): Secondary | ICD-10-CM | POA: Insufficient documentation

## 2015-12-26 DIAGNOSIS — R1010 Upper abdominal pain, unspecified: Secondary | ICD-10-CM

## 2015-12-26 DIAGNOSIS — R1011 Right upper quadrant pain: Secondary | ICD-10-CM | POA: Diagnosis present

## 2015-12-26 LAB — COMPREHENSIVE METABOLIC PANEL
ALBUMIN: 4.7 g/dL (ref 3.5–5.0)
ALT: 24 U/L (ref 14–54)
ANION GAP: 10 (ref 5–15)
AST: 22 U/L (ref 15–41)
Alkaline Phosphatase: 60 U/L (ref 38–126)
BILIRUBIN TOTAL: 1.1 mg/dL (ref 0.3–1.2)
BUN: 6 mg/dL (ref 6–20)
CHLORIDE: 107 mmol/L (ref 101–111)
CO2: 20 mmol/L — ABNORMAL LOW (ref 22–32)
Calcium: 9.4 mg/dL (ref 8.9–10.3)
Creatinine, Ser: 0.67 mg/dL (ref 0.44–1.00)
GFR calc Af Amer: >60 mL/min (ref >60)
GFR calc non Af Amer: >60 mL/min (ref >60)
GLUCOSE: 86 mg/dL (ref 65–99)
POTASSIUM: 3.7 mmol/L (ref 3.5–5.1)
SODIUM: 137 mmol/L (ref 135–145)
TOTAL PROTEIN: 7.8 g/dL (ref 6.5–8.1)

## 2015-12-26 LAB — URINALYSIS COMPLETE WITH MICROSCOPIC (ARMC ONLY)
Bilirubin Urine: NEGATIVE
Glucose, UA: NEGATIVE mg/dL
HGB URINE DIPSTICK: NEGATIVE
Ketones, ur: NEGATIVE mg/dL
NITRITE: NEGATIVE
PROTEIN: NEGATIVE mg/dL
SPECIFIC GRAVITY, URINE: 1.01 (ref 1.005–1.030)
pH: 7 (ref 5.0–8.0)

## 2015-12-26 LAB — CBC
HEMATOCRIT: 40.1 % (ref 35.0–47.0)
HEMOGLOBIN: 13.9 g/dL (ref 12.0–16.0)
MCH: 30 pg (ref 26.0–34.0)
MCHC: 34.7 g/dL (ref 32.0–36.0)
MCV: 86.5 fL (ref 80.0–100.0)
Platelets: 267 10*3/uL (ref 150–440)
RBC: 4.64 MIL/uL (ref 3.80–5.20)
RDW: 13.1 % (ref 11.5–14.5)
WBC: 9.1 10*3/uL (ref 3.6–11.0)

## 2015-12-26 LAB — HCG, QUANTITATIVE, PREGNANCY: HCG, BETA CHAIN, QUANT, S: 43274 m[IU]/mL — AB (ref <5)

## 2015-12-26 LAB — LIPASE, BLOOD: LIPASE: 25 U/L (ref 11–51)

## 2015-12-26 LAB — ABO/RH: ABO/RH(D): O POS

## 2015-12-26 MED ORDER — DOXYLAMINE-PYRIDOXINE 10-10 MG PO TBEC: DELAYED_RELEASE_TABLET | ORAL | Status: DC

## 2015-12-26 MED ORDER — ONDANSETRON HCL 4 MG/2ML IJ SOLN
4.0000 mg | Freq: Once | INTRAMUSCULAR | Status: AC
  Administered 2015-12-26: 4 mg via INTRAVENOUS
  Filled 2015-12-26: qty 2

## 2015-12-26 MED ORDER — PANTOPRAZOLE SODIUM 20 MG PO TBEC: 20.0000 mg | DELAYED_RELEASE_TABLET | Freq: Every day | ORAL | Status: DC

## 2015-12-26 MED ORDER — METOCLOPRAMIDE HCL 5 MG PO TABS: 5.0000 mg | ORAL_TABLET | Freq: Three times a day (TID) | ORAL | Status: DC

## 2015-12-26 MED ORDER — SODIUM CHLORIDE 0.9 % IV SOLN
1000.0000 mL | Freq: Once | INTRAVENOUS | Status: AC
  Administered 2015-12-26: 1000 mL via INTRAVENOUS

## 2015-12-26 NOTE — ED Notes (Addendum)
Pt is [redacted] weeks pregnant and has been vomiting with dizziness at times, pt complains of right sided abdominal pain, pt denies vaginal bleeding

## 2015-12-26 NOTE — Discharge Instructions (Signed)

## 2015-12-26 NOTE — ED Provider Notes (Signed)
[facility] Emergency Department Provider Note  ____________________________________________    I have reviewed the triage vital signs and the nursing notes.   HISTORY  Chief Complaint Emesis During Pregnancy    HPI Anna Flores is a 25 y.o. female who presents with complaints of right upper quadrant abdominal pain, as well as nausea and vomiting. She reports she is [redacted] weeks pregnant. She reports she had this with previous pregnancy where she was diagnosed with hyperemesis gravidarum. However this feels slightly more severe and is primarily in the right upper quadrant as opposed to the right lower quadrant where it has been in the past. She also reports that she had vaginal spotting yesterday but is currently not spotting today. She does report she has had an ultrasound during this pregnancy which was reportedly normal. She is a G3 P2     Past Medical History  Diagnosis Date  . Depression   . Anxiety     There are no active problems to display for this patient.   History reviewed. No pertinent past surgical history.  Current Outpatient Rx  Name  Route  Sig  Dispense  Refill  . acetaminophen (TYLENOL) 500 MG tablet   Oral   Take 2 tablets (1,000 mg total) by mouth every 6 (six) hours as needed.   30 tablet   0   . azithromycin (ZITHROMAX) 250 MG tablet   Oral   Take 1 tablet (250 mg total) by mouth daily. Take first 2 tablets together, then 1 every day until finished.   6 tablet   0   . cefUROXime (CEFTIN) 250 MG tablet   Oral   Take 1 tablet (250 mg total) by mouth 2 (two) times daily with a meal.   20 tablet   0   . citalopram (CELEXA) 20 MG tablet   Oral   Take 20 mg by mouth daily.         . cloNIDine (CATAPRES) 0.1 MG tablet   Oral   Take 0.1 mg by mouth 2 (two) times daily.         Marland Kitchen. etodolac (LODINE) 200 MG capsule   Oral   Take 1 capsule (200 mg total) by mouth every 8 (eight) hours.   12 capsule   0   . fluticasone  (FLONASE) 50 MCG/ACT nasal spray   Each Nare   Place 1 spray into both nostrils 2 (two) times daily.   16 g   0   . ondansetron (ZOFRAN-ODT) 4 MG disintegrating tablet   Oral   Take 1 tablet (4 mg total) by mouth 2 (two) times daily.   6 tablet   0   . sodium chloride (OCEAN) 0.65 % SOLN nasal spray   Each Nare   Place 2 sprays into both nostrils every 2 (two) hours while awake.      0     Allergies Amoxicillin and Cherry  No family history on file.  Social History Social History  Substance Use Topics  . Smoking status: Never Smoker   . Smokeless tobacco: None  . Alcohol Use: No    Review of Systems  Constitutional: Negative for fever. Eyes: Negative for redness ENT: Negative for sore throat Cardiovascular: Negative for chest pain Respiratory: Negative for shortness of breath. Gastrointestinal: As above Genitourinary: Negative for dysuria. Musculoskeletal: Negative for back pain. Skin: Negative for rash. Neurological: Negative for focal weakness Psychiatric: no anxiety    ____________________________________________   PHYSICAL EXAM:  VITAL SIGNS: ED  Triage Vitals  Enc Vitals Group     BP 12/26/15 1203 117/84 mmHg     Pulse Rate 12/26/15 1203 77     Resp 12/26/15 1203 18     Temp 12/26/15 1203 98.3 F (36.8 C)     Temp Source 12/26/15 1203 Oral     SpO2 12/26/15 1203 100 %     Weight 12/26/15 1203 175 lb (79.379 kg)     Height 12/26/15 1203  (1.626 m)     Head Cir --      Peak Flow --      Pain Score 12/26/15 1203 5     Pain Loc --      Pain Edu? --      Excl. in GC? --      Constitutional: Alert and oriented. Well appearing and in no distress.  Eyes: Conjunctivae are normal. No erythema or injection ENT   Head: Normocephalic and atraumatic.   Mouth/Throat: Mucous membranes are moist. Cardiovascular: Normal rate, regular rhythm. Normal and symmetric distal pulses are present in the upper extremities. No murmurs or rubs   Respiratory: Normal respiratory effort without tachypnea nor retractions. Breath sounds are clear and equal bilaterally.  Gastrointestinal: Tenderness to palpation in the right upper quadrant otherwise unremarkable abdomen. No distention. There is no CVA tenderness. Genitourinary: deferred  Musculoskeletal: Nontender with normal range of motion in all extremities. No lower extremity tenderness nor edema. Neurologic:  Normal speech and language. No gross focal neurologic deficits are appreciated. Skin:  Skin is warm, dry and intact. No rash noted. Psychiatric: Mood and affect are normal. Patient exhibits appropriate insight and judgment.  ____________________________________________    LABS (pertinent positives/negatives)  Labs Reviewed  COMPREHENSIVE METABOLIC PANEL - Abnormal; Notable for the following:    CO2 20 (*)    All other components within normal limits  URINALYSIS COMPLETEWITH MICROSCOPIC ([facility] ONLY) - Abnormal; Notable for the following:    Color, Urine YELLOW (*)    APPearance CLEAR (*)    Leukocytes, UA 2+ (*)    Bacteria, UA RARE (*)    Squamous Epithelial / LPF 0-5 (*)    All other components within normal limits  HCG, QUANTITATIVE, PREGNANCY - Abnormal; Notable for the following:    hCG, Beta Chain, Quant, S 43274 (*)    All other components within normal limits  LIPASE, BLOOD  CBC  ABO/RH    ____________________________________________   EKG  None  ____________________________________________    RADIOLOGY  Ultrasound right upper quadrant, ultrasound OB pending  ____________________________________________   PROCEDURES  Procedure(s) performed: none  Critical Care performed: none  ____________________________________________   INITIAL IMPRESSION / ASSESSMENT AND PLAN / ED COURSE  Pertinent labs & imaging results that were available during my care of the patient were reviewed by me and considered in my medical decision making (see chart for  details).  Patient presents with nausea vomiting, right upper quadrant pain and vaginal spotting. We will treat her nausea and vomiting with IV Zofran, IV fluids. Given that she is tender in the right upper quadrant I ordered a ultrasound to evaluate her gallbladder as well as to evaluate the pregnancy.  Patient is Rh+  ----------------------------------------- 4:31 PM on 12/26/2015 -----------------------------------------  Patient reports she is feeling better after treatment. Ultrasound right upper quadrant was unremarkable, OB ultrasound shows 6 week 1 day IUP. Patient has follow-up scheduled with [facility] already. Her last period see she required diclegis, Protonix and Reglan, I'll prescribe this for her now  ____________________________________________   FINAL CLINICAL IMPRESSION(S) / ED DIAGNOSES  Final diagnoses:  Upper abdominal pain  Nausea and vomiting during pregnancy          Jene Every, MD 12/26/15 1631

## 2016-01-10 ENCOUNTER — Observation Stay
Admission: AD | Admit: 2016-01-10 | Discharge: 2016-01-11 | Disposition: A | Discharge status: Home / Self Care | Payer: Medicaid Other | Source: Ambulatory Visit | Attending: Obstetrics & Gynecology | Admitting: Obstetrics & Gynecology

## 2016-01-10 DIAGNOSIS — Z91018 Allergy to other foods: Secondary | ICD-10-CM | POA: Insufficient documentation

## 2016-01-10 DIAGNOSIS — Z9889 Other specified postprocedural states: Secondary | ICD-10-CM | POA: Diagnosis not present

## 2016-01-10 DIAGNOSIS — Z881 Allergy status to other antibiotic agents status: Secondary | ICD-10-CM | POA: Diagnosis not present

## 2016-01-10 DIAGNOSIS — Z3A08 8 weeks gestation of pregnancy: Secondary | ICD-10-CM | POA: Diagnosis not present

## 2016-01-10 DIAGNOSIS — O210 Mild hyperemesis gravidarum: Secondary | ICD-10-CM | POA: Diagnosis present

## 2016-01-10 LAB — COMPREHENSIVE METABOLIC PANEL WITH GFR
ALT: 35 U/L (ref 14–54)
AST: 24 U/L (ref 15–41)
Albumin: 4.3 g/dL (ref 3.5–5.0)
Alkaline Phosphatase: 56 U/L (ref 38–126)
Anion gap: 10 (ref 5–15)
BUN: 6 mg/dL (ref 6–20)
CO2: 22 mmol/L (ref 22–32)
Calcium: 9.2 mg/dL (ref 8.9–10.3)
Chloride: 103 mmol/L (ref 101–111)
Creatinine, Ser: 0.62 mg/dL (ref 0.44–1.00)
GFR calc Af Amer: >60 mL/min (ref >60)
GFR calc non Af Amer: >60 mL/min (ref >60)
Glucose, Bld: 86 mg/dL (ref 65–99)
Potassium: 3.4 mmol/L — ABNORMAL LOW (ref 3.5–5.1)
Sodium: 135 mmol/L (ref 135–145)
Total Bilirubin: 0.8 mg/dL (ref 0.3–1.2)
Total Protein: 7 g/dL (ref 6.5–8.1)

## 2016-01-10 LAB — TSH: TSH: 1.703 u[IU]/mL (ref 0.350–4.500)

## 2016-01-10 LAB — CBC
HCT: 37.1 % (ref 35.0–47.0)
Hemoglobin: 13.1 g/dL (ref 12.0–16.0)
MCH: 30.3 pg (ref 26.0–34.0)
MCHC: 35.3 g/dL (ref 32.0–36.0)
MCV: 85.8 fL (ref 80.0–100.0)
Platelets: 254 K/uL (ref 150–440)
RBC: 4.32 MIL/uL (ref 3.80–5.20)
RDW: 12.9 % (ref 11.5–14.5)
WBC: 9.6 K/uL (ref 3.6–11.0)

## 2016-01-10 MED ORDER — METHYLPREDNISOLONE 4 MG PO TABS: 8.0000 mg | ORAL_TABLET | Freq: Every day | ORAL | Status: DC

## 2016-01-10 MED ORDER — METHYLPREDNISOLONE 4 MG PO TABS: 4.0000 mg | ORAL_TABLET | Freq: Every day | ORAL | Status: DC

## 2016-01-10 MED ORDER — METHYLPREDNISOLONE 16 MG PO TABS
16.0000 mg | ORAL_TABLET | Freq: Every day | ORAL | Status: DC
  Filled 2016-01-10: qty 1

## 2016-01-10 MED ORDER — PRENATAL MULTIVITAMIN CH: 1.0000 | ORAL_TABLET | Freq: Every day | ORAL | Status: DC

## 2016-01-10 MED ORDER — ONDANSETRON HCL 4 MG PO TABS: 4.0000 mg | ORAL_TABLET | Freq: Four times a day (QID) | ORAL | Status: DC | PRN

## 2016-01-10 MED ORDER — ACETAMINOPHEN 325 MG PO TABS: 650.0000 mg | ORAL_TABLET | ORAL | Status: DC | PRN

## 2016-01-10 MED ORDER — FAMOTIDINE IN NACL 20-0.9 MG/50ML-% IV SOLN
20.0000 mg | Freq: Two times a day (BID) | INTRAVENOUS | Status: DC
  Administered 2016-01-10 – 2016-01-11 (×2): 20 mg via INTRAVENOUS
  Filled 2016-01-10 (×2): qty 50

## 2016-01-10 MED ORDER — METHYLPREDNISOLONE SODIUM SUCC 125 MG IJ SOLR
48.0000 mg | Freq: Once | INTRAMUSCULAR | Status: AC
  Administered 2016-01-10: 48 mg via INTRAVENOUS
  Filled 2016-01-10: qty 0.77

## 2016-01-10 MED ORDER — ONDANSETRON HCL 4 MG/2ML IJ SOLN
4.0000 mg | Freq: Four times a day (QID) | INTRAMUSCULAR | Status: DC | PRN
  Administered 2016-01-10: 4 mg via INTRAVENOUS
  Filled 2016-01-10: qty 2

## 2016-01-10 MED ORDER — LACTATED RINGERS IV SOLN
INTRAVENOUS | Status: DC
  Administered 2016-01-10 – 2016-01-11 (×2): via INTRAVENOUS

## 2016-01-10 MED ORDER — METHYLPREDNISOLONE 16 MG PO TABS
16.0000 mg | ORAL_TABLET | Freq: Every day | ORAL | Status: DC
  Administered 2016-01-11: 16 mg via ORAL
  Filled 2016-01-10: qty 1

## 2016-01-10 MED ORDER — SODIUM CHLORIDE 0.9 % IV SOLN
Freq: Once | INTRAVENOUS | Status: AC
  Administered 2016-01-10: 18:00:00 via INTRAVENOUS
  Filled 2016-01-10: qty 1000

## 2016-01-10 MED ORDER — METOCLOPRAMIDE HCL 5 MG/ML IJ SOLN
10.0000 mg | INTRAMUSCULAR | Status: DC
  Administered 2016-01-10 – 2016-01-11 (×6): 10 mg via INTRAVENOUS
  Filled 2016-01-10 (×11): qty 2

## 2016-01-11 DIAGNOSIS — O210 Mild hyperemesis gravidarum: Secondary | ICD-10-CM | POA: Diagnosis not present

## 2016-01-11 LAB — GLUCOSE, RANDOM: GLUCOSE: 107 mg/dL — AB (ref 65–99)

## 2016-01-11 MED ORDER — ACETAMINOPHEN 650 MG RE SUPP
650.0000 mg | Freq: Once | RECTAL | Status: AC
  Administered 2016-01-11: 650 mg via RECTAL
  Filled 2016-01-11: qty 1

## 2016-01-11 MED ORDER — METHYLPREDNISOLONE 8 MG PO TABS: 8.0000 mg | ORAL_TABLET | Freq: Every day | ORAL | Status: DC

## 2016-01-11 MED ORDER — ONDANSETRON 4 MG PO TBDP: 8.0000 mg | ORAL_TABLET | Freq: Three times a day (TID) | ORAL | Status: DC | PRN

## 2016-01-11 MED ORDER — METOCLOPRAMIDE HCL 5 MG PO TABS: 10.0000 mg | ORAL_TABLET | Freq: Three times a day (TID) | ORAL | Status: DC

## 2016-01-11 NOTE — Progress Notes (Signed)
Pt states she is feeling better and she was able to eat graham crackers and drink sprite this am without any nausea. She is doing well with the current medication plan, and is requesting discharge to home today due to child care issues. Will discuss plan of care with Dr Elesa MassedWard prior to making discharge plan.  Anna Flores,Anna Flores, CNM

## 2016-01-11 NOTE — Progress Notes (Signed)
D/C order from MD.  Reviewed d/c instructions and prescriptions with patient and answered any questions.  Patient d/c home via wheelchair by nursing/auxillary. 

## 2016-01-11 NOTE — Discharge Instructions (Signed)
Eating Plan for Hyperemesis Gravidarum °Severe cases of hyperemesis gravidarum can lead to dehydration and malnutrition. The hyperemesis eating plan is one way to lessen the symptoms of nausea and vomiting. It is often used with prescribed medicines to control your symptoms.  °WHAT CAN I DO TO RELIEVE MY SYMPTOMS? °Listen to your body. Everyone is different and has different preferences. Find what works best for you. Some of the following things may help: °· Eat and drink slowly. °· Eat 5-6 small meals daily instead of 3 large meals.   °· Eat crackers before you get out of bed in the morning.   °· Starchy foods are usually well tolerated (such as cereal, toast, bread, potatoes, pasta, rice, and pretzels).   °· Ginger may help with nausea. Add ¼ tsp ground ginger to hot tea or choose ginger tea.   °· Try drinking 100% fruit juice or an electrolyte drink. °· Continue to take your prenatal vitamins as directed by your health care provider. If you are having trouble taking your prenatal vitamins, talk with your health care provider about different options. °· Include at least 1 serving of protein with your meals and snacks (such as meats or poultry, beans, nuts, eggs, or yogurt). Try eating a protein-rich snack before bed (such as cheese and crackers or a half turkey or peanut butter sandwich). °WHAT THINGS SHOULD I AVOID TO REDUCE MY SYMPTOMS? °The following things may help reduce your symptoms: °· Avoid foods with strong smells. Try eating meals in well-ventilated areas that are free of odors. °· Avoid drinking water or other beverages with meals. Try not to drink anything less than 30 minutes before and after meals. °· Avoid drinking more than 1 cup of fluid at a time. °· Avoid fried or high-fat foods, such as butter and cream sauces. °· Avoid spicy foods. °· Avoid skipping meals the best you can. Nausea can be more intense on an empty stomach. If you cannot tolerate food at that time, do not force it. Try sucking on  ice chips or other frozen items and make up the calories later. °· Avoid lying down within 2 hours after eating. °  °This information is not intended to replace advice given to you by your health care provider. Make sure you discuss any questions you have with your health care provider. °  °Document Released: 05/19/2007 Document Revised: 07/27/2013 Document Reviewed: 05/26/2013 °Elsevier Interactive Patient Education ©2016 [employer] ° °

## 2016-01-11 NOTE — Discharge Summary (Signed)
Physician Discharge Summary  Patient ID: Anna Flores MRN: [id number] DOB/AGE: 12-11-1990 25 y.o.  Admit date: 01/10/2016 Discharge date: 01/11/2016  Admission Diagnoses: G3P2 at [redacted]weeks gestation with hyperemesis gravidarum  Discharge Diagnoses:  Active Problems:   Hyperemesis arising during pregnancy   Discharged Condition: good  Hospital Course: admitted for hyperemesis treatment, IV fluids, medications, trial of PO intake, discharge to home  Consults: None  Significant Diagnostic Studies: labs:   Results for Anna Flores, Anna Flores (MRN [id number]) as of 01/11/2016 12:52  Ref. Range 01/10/2016 15:22 01/11/2016 05:39  Sodium Latest Ref Range: 135-145 mmol/L 135   Potassium Latest Ref Range: 3.5-5.1 mmol/L 3.4 (L)   Chloride Latest Ref Range: 101-111 mmol/L 103   CO2 Latest Ref Range: 22-32 mmol/L 22   BUN Latest Ref Range: 6-20 mg/dL 6   Creatinine Latest Ref Range: 0.44-1.00 mg/dL 0.62   Calcium Latest Ref Range: 8.9-10.3 mg/dL 9.2   EGFR (Non-African Amer.) Latest Ref Range: >60 mL/min >60   EGFR (African American) Latest Ref Range: >60 mL/min >60   Glucose Latest Ref Range: 65-99 mg/dL 86 107 (H)  Anion gap Latest Ref Range: 5-15  10   Alkaline Phosphatase Latest Ref Range: 38-126 U/L 56   Albumin Latest Ref Range: 3.5-5.0 g/dL 4.3   AST Latest Ref Range: 15-41 U/L 24   ALT Latest Ref Range: 14-54 U/L 35   Total Protein Latest Ref Range: 6.5-8.1 g/dL 7.0   Total Bilirubin Latest Ref Range: 0.3-1.2 mg/dL 0.8   WBC Latest Ref Range: 3.6-11.0 K/uL 9.6   RBC Latest Ref Range: 3.80-5.20 MIL/uL 4.32   Hemoglobin Latest Ref Range: 12.0-16.0 g/dL 13.1   HCT Latest Ref Range: 35.0-47.0 % 37.1   MCV Latest Ref Range: 80.0-100.0 fL 85.8   MCH Latest Ref Range: 26.0-34.0 pg 30.3   MCHC Latest Ref Range: 32.0-36.0 g/dL 35.3   RDW Latest Ref Range: 11.5-14.5 % 12.9   Platelets Latest Ref Range: 150-440 K/uL 254   TSH Latest Ref Range: 0.350-4.500 uIU/mL 1.703     Treatments: IV hydration,  steroids: reglan  Discharge Exam: Blood pressure 103/63, pulse 68, temperature 98.4 F (36.9 C), temperature source Oral, resp. rate 18, last menstrual period 11/08/2015, SpO2 98 %. General appearance: alert, cooperative and appears stated age  Disposition: 01-Home or Self Care     Medication List    TAKE these medications        acetaminophen 500 MG tablet  Commonly known as:  TYLENOL  Take 2 tablets (1,000 mg total) by mouth every 6 (six) hours as needed.     azithromycin 250 MG tablet  Commonly known as:  ZITHROMAX  Take 1 tablet (250 mg total) by mouth daily. Take first 2 tablets together, then 1 every day until finished.     cefUROXime 250 MG tablet  Commonly known as:  CEFTIN  Take 1 tablet (250 mg total) by mouth 2 (two) times daily with a meal.     citalopram 20 MG tablet  Commonly known as:  CELEXA  Take 20 mg by mouth daily.     cloNIDine 0.1 MG tablet  Commonly known as:  CATAPRES  Take 0.1 mg by mouth 2 (two) times daily.     Doxylamine-Pyridoxine 10-10 MG Tbec  Two tablets at bedtime on day 1 and 2; if symptoms persist, take 1 tablet in morning and 2 tablets at bedtime on day 3; if symptoms persist, may increase to 1 tablet in morning, 1 tablet mid-afternoon, and 2 tablets at bedtime  on day 4     etodolac 200 MG capsule  Commonly known as:  LODINE  Take 1 capsule (200 mg total) by mouth every 8 (eight) hours.     fluticasone 50 MCG/ACT nasal spray  Commonly known as:  FLONASE  Place 1 spray into both nostrils 2 (two) times daily.     methylPREDNISolone 8 MG tablet  Commonly known as:  MEDROL  Take 1 tablet (8 mg total) by mouth daily. Steroid taper:  31m (2 tablets) TID for 4 days then 860mTID for 4 days then 37m36m1/2 tablet)TID for 4 days then BID for 4 days then daily for 4 days then stop.     metoCLOPramide 5 MG tablet  Commonly known as:  REGLAN  Take 2 tablets (10 mg total) by mouth every 8 (eight) hours.     ondansetron 4 MG disintegrating  tablet  Commonly known as:  ZOFRAN-ODT  Take 2 tablets (8 mg total) by mouth every 8 (eight) hours as needed for nausea or vomiting.     pantoprazole 20 MG tablet  Commonly known as:  PROTONIX  Take 1 tablet (20 mg total) by mouth daily.     sodium chloride 0.65 % Soln nasal spray  Commonly known as:  OCEAN  Place 2 sprays into both nostrils every 2 (two) hours while awake.      Steroid tapering instructions reviewed with patient by RN     Follow-up Information    Please follow up.   Why:  keep regular scheduled appointment or return for worsening symptoms      Signed: GLERod CanNM

## 2016-02-09 ENCOUNTER — Observation Stay
Admission: AD | Admit: 2016-02-09 | Discharge: 2016-02-09 | Disposition: A | Discharge status: Home / Self Care | Payer: Medicaid Other | Source: Ambulatory Visit | Attending: Obstetrics & Gynecology | Admitting: Obstetrics & Gynecology

## 2016-02-09 DIAGNOSIS — Z79899 Other long term (current) drug therapy: Secondary | ICD-10-CM | POA: Insufficient documentation

## 2016-02-09 DIAGNOSIS — O211 Hyperemesis gravidarum with metabolic disturbance: Secondary | ICD-10-CM | POA: Diagnosis present

## 2016-02-09 DIAGNOSIS — O210 Mild hyperemesis gravidarum: Secondary | ICD-10-CM | POA: Diagnosis present

## 2016-02-09 DIAGNOSIS — E860 Dehydration: Secondary | ICD-10-CM | POA: Insufficient documentation

## 2016-02-09 DIAGNOSIS — Z880 Allergy status to penicillin: Secondary | ICD-10-CM | POA: Insufficient documentation

## 2016-02-09 DIAGNOSIS — Z3A22 22 weeks gestation of pregnancy: Secondary | ICD-10-CM | POA: Diagnosis not present

## 2016-02-09 LAB — COMPREHENSIVE METABOLIC PANEL
ALBUMIN: 3.8 g/dL (ref 3.5–5.0)
ALK PHOS: 49 U/L (ref 38–126)
ALT: 28 U/L (ref 14–54)
ANION GAP: 6 (ref 5–15)
AST: 22 U/L (ref 15–41)
BILIRUBIN TOTAL: 0.6 mg/dL (ref 0.3–1.2)
CALCIUM: 9.2 mg/dL (ref 8.9–10.3)
CO2: 24 mmol/L (ref 22–32)
Chloride: 105 mmol/L (ref 101–111)
Creatinine, Ser: 0.44 mg/dL (ref 0.44–1.00)
GFR calc Af Amer: >60 mL/min (ref >60)
GLUCOSE: 90 mg/dL (ref 65–99)
Potassium: 3.3 mmol/L — ABNORMAL LOW (ref 3.5–5.1)
Sodium: 135 mmol/L (ref 135–145)
TOTAL PROTEIN: 6.7 g/dL (ref 6.5–8.1)

## 2016-02-09 MED ORDER — LACTATED RINGERS IV SOLN
INTRAVENOUS | Status: DC
  Administered 2016-02-09: 13:00:00 via INTRAVENOUS

## 2016-02-09 MED ORDER — METOCLOPRAMIDE HCL 5 MG/ML IJ SOLN
10.0000 mg | Freq: Four times a day (QID) | INTRAMUSCULAR | Status: DC
  Administered 2016-02-09: 10 mg via INTRAVENOUS
  Filled 2016-02-09: qty 2

## 2016-02-09 MED ORDER — ONDANSETRON 8 MG PO TBDP
8.0000 mg | ORAL_TABLET | Freq: Three times a day (TID) | ORAL | Status: DC | PRN
  Filled 2016-02-09: qty 1

## 2016-02-09 MED ORDER — PYRIDOXINE HCL 100 MG/ML IJ SOLN
50.0000 mg | Freq: Four times a day (QID) | INTRAMUSCULAR | Status: DC
  Administered 2016-02-09: 50 mg via INTRAVENOUS
  Filled 2016-02-09 (×2): qty 0.5

## 2016-02-09 MED ORDER — PROMETHAZINE HCL 25 MG/ML IJ SOLN
25.0000 mg | Freq: Four times a day (QID) | INTRAMUSCULAR | Status: DC | PRN
  Administered 2016-02-09: 25 mg via INTRAVENOUS
  Filled 2016-02-09: qty 1

## 2016-02-09 MED ORDER — PRENATAL MULTIVITAMIN CH
1.0000 | ORAL_TABLET | Freq: Every day | ORAL | Status: DC
  Administered 2016-02-09: 1 via ORAL
  Filled 2016-02-09: qty 1

## 2016-02-09 MED ORDER — ACETAMINOPHEN 500 MG PO TABS
1000.0000 mg | ORAL_TABLET | Freq: Four times a day (QID) | ORAL | Status: DC | PRN
  Administered 2016-02-09: 1000 mg via ORAL
  Filled 2016-02-09: qty 2

## 2016-02-09 MED ORDER — PANTOPRAZOLE SODIUM 40 MG PO TBEC
40.0000 mg | DELAYED_RELEASE_TABLET | Freq: Every day | ORAL | Status: DC
  Administered 2016-02-09: 40 mg via ORAL
  Filled 2016-02-09: qty 1

## 2016-02-09 NOTE — Discharge Summary (Signed)
Discharge reviewed with pt. I have spoken with 2 different people from the IT dept to try and print this pt's dc papers however and the computer states the dc med rec is not done. Dr. Tiburcio PeaHarris states he has reviewed and reconciliated her dc at least 3 times. At this point I, the MD, and the IT dept does not know how to print dc papers, therefore I am having the pt sign an "old" dc summary and Dr. Tiburcio PeaHarris said he would sign this as well but the pt needs to be dc'd now. There are no new meds for this pt and no prescriptions to review. Sje is to resume all previously ordered home meds and she is aware of this.

## 2016-02-09 NOTE — H&P (Signed)
History and Physical Interval Note:  02/09/2016 1:03 PM  Anna Flores  has presented today for worsening nausea, vomiting, and headache this first trimester of pregnancy.  She is a 25 yo G3P2 at 7212 6/[redacted] weeks EGA, and has had 2 hospitalizations for n/v of pregnancy.  She has been on Diclegis, Reglan, and Zofran at home, and usually does well until gets off cycle of taking meds or has significant stress, as she has had this past 2 weeks.  Headache for 3 days has been mod-severe and unrelenting.  No other neurologic signs.  The patient's history has been reviewed, patient examined, no change in status, and is stable for induction as planned.  See H&P. I have reviewed the patient's chart and labs.  Questions were answered to the patient's satisfaction.    Orders entered.  IVF, Phenergan, check CMP, try diet. Risks of hyperemesis and pregnancy discussed.  Letitia Libraobert Paul Keiarah Orlowski

## 2016-02-09 NOTE — Discharge Summary (Signed)
Physician Discharge Summary  Patient ID: Anna Flores MRN: [id number] DOB/AGE: [birth year] 25 y.o.  Admit date: 02/09/2016 Discharge date: 02/09/2016  Admission Diagnoses: hyperemesis  Discharge Diagnoses:  Principal Problem:   Hyperemesis gravidarum before end of [redacted] week gestation, dehydration   Discharged Condition: good  Hospital Course: Hydration and antiemetics tol reg diet prior to discharge  Consults: None  Significant Diagnostic Studies: labs: nornal  Treatments: IV hydration  Discharge Exam: Blood pressure 99/61, pulse 80, temperature 98.2 F (36.8 C), temperature source Oral, resp. rate 16, SpO2 100 %.   Disposition: 01-Home or Self Care     Medication List    ASK your doctor about these medications        acetaminophen 500 MG tablet  Commonly known as:  TYLENOL  Take 2 tablets (1,000 mg total) by mouth every 6 (six) hours as needed.     azithromycin 250 MG tablet  Commonly known as:  ZITHROMAX  Take 1 tablet (250 mg total) by mouth daily. Take first 2 tablets together, then 1 every day until finished.     cefUROXime 250 MG tablet  Commonly known as:  CEFTIN  Take 1 tablet (250 mg total) by mouth 2 (two) times daily with a meal.     citalopram 20 MG tablet  Commonly known as:  CELEXA  Take 20 mg by mouth daily.     cloNIDine 0.1 MG tablet  Commonly known as:  CATAPRES  Take 0.1 mg by mouth 2 (two) times daily.     Doxylamine-Pyridoxine 10-10 MG Tbec  Two tablets at bedtime on day 1 and 2; if symptoms persist, take 1 tablet in morning and 2 tablets at bedtime on day 3; if symptoms persist, may increase to 1 tablet in morning, 1 tablet mid-afternoon, and 2 tablets at bedtime on day 4     etodolac 200 MG capsule  Commonly known as:  LODINE  Take 1 capsule (200 mg total) by mouth every 8 (eight) hours.     fluticasone 50 MCG/ACT nasal spray  Commonly known as:  FLONASE  Place 1 spray into both nostrils 2 (two) times daily.     methylPREDNISolone 8  MG tablet  Commonly known as:  MEDROL  Take 1 tablet (8 mg total) by mouth daily. Steroid taper:  16mg  (2 tablets) TID for 4 days then 8mg  TID for 4 days then 4mg  (1/2 tablet)TID for 4 days then BID for 4 days then daily for 4 days then stop.     metoCLOPramide 5 MG tablet  Commonly known as:  REGLAN  Take 2 tablets (10 mg total) by mouth every 8 (eight) hours.     ondansetron 4 MG disintegrating tablet  Commonly known as:  ZOFRAN-ODT  Take 2 tablets (8 mg total) by mouth every 8 (eight) hours as needed for nausea or vomiting.     pantoprazole 20 MG tablet  Commonly known as:  PROTONIX  Take 1 tablet (20 mg total) by mouth daily.     sodium chloride 0.65 % Soln nasal spray  Commonly known as:  OCEAN  Place 2 sprays into both nostrils every 2 (two) hours while awake.         Signed: Letitia LibraRobert Paul Harris 02/09/2016, 6:33 PM

## 2016-02-13 ENCOUNTER — Ambulatory Visit
Admission: EM | Admit: 2016-02-13 | Discharge: 2016-02-13 | Disposition: A | Discharge status: Home / Self Care | Payer: Medicaid Other | Attending: Internal Medicine | Admitting: Internal Medicine

## 2016-02-13 DIAGNOSIS — O99512 Diseases of the respiratory system complicating pregnancy, second trimester: Secondary | ICD-10-CM | POA: Diagnosis not present

## 2016-02-13 DIAGNOSIS — Z3A14 14 weeks gestation of pregnancy: Secondary | ICD-10-CM | POA: Insufficient documentation

## 2016-02-13 DIAGNOSIS — R0981 Nasal congestion: Secondary | ICD-10-CM

## 2016-02-13 DIAGNOSIS — J029 Acute pharyngitis, unspecified: Secondary | ICD-10-CM | POA: Diagnosis present

## 2016-02-13 DIAGNOSIS — F419 Anxiety disorder, unspecified: Secondary | ICD-10-CM | POA: Insufficient documentation

## 2016-02-13 DIAGNOSIS — J069 Acute upper respiratory infection, unspecified: Secondary | ICD-10-CM | POA: Diagnosis not present

## 2016-02-13 DIAGNOSIS — O99342 Other mental disorders complicating pregnancy, second trimester: Secondary | ICD-10-CM | POA: Insufficient documentation

## 2016-02-13 DIAGNOSIS — Z880 Allergy status to penicillin: Secondary | ICD-10-CM | POA: Insufficient documentation

## 2016-02-13 DIAGNOSIS — F329 Major depressive disorder, single episode, unspecified: Secondary | ICD-10-CM | POA: Insufficient documentation

## 2016-02-13 LAB — RAPID STREP SCREEN (MED CTR MEBANE ONLY): Streptococcus, Group A Screen (Direct): NEGATIVE

## 2016-02-13 NOTE — ED Notes (Signed)
Patient complains of sinus pressure, sore throat, sneezing, coughing and body aches since Saturday. She is currently pregnant.

## 2016-02-13 NOTE — ED Provider Notes (Signed)
CSN: [id number]     Arrival date & time 02/13/16  1218 History   First MD Initiated Contact with Patient 02/13/16 1354     Chief Complaint  Patient presents with  . Sore Throat   HPI  25yo lady, [redacted] weeks pregnant with some difficulty with hyperemesis. Presents today with onset of sore throat, R>L, sinus congestion, post nasal drip, little bit of dry cough.  No known fever.   Nausea, some emesis/dry heaves, which she attributes to pregnancy. Hx sinusitis, strep throat, had tonsils out a couple years ago.  Past Medical History  Diagnosis Date  . Depression   . Anxiety    Past surgical hx:  Tonsillectomy about 2015.  Family History  Problem Relation Age of Onset  . Adopted: Yes   Social History  Substance Use Topics  . Smoking status: Never Smoker   . Smokeless tobacco: None  . Alcohol Use: No   OB History    Gravida Para Term Preterm AB TAB SAB Ectopic Multiple Living   3 2             Review of Systems  All other systems reviewed and are negative.   Allergies  Amoxicillin; Cherry flavor; and [employer] Medications   Prior to Admission medications   Medication Sig Start Date End Date Taking? Authorizing Provider  Doxylamine-Pyridoxine (DICLEGIS PO) Take by mouth.   Yes Historical Provider, MD  metoCLOPramide (REGLAN) 5 MG tablet Take 2 tablets (10 mg total) by mouth every 8 (eight) hours. 01/11/16 01/10/17 Yes Chelsea C Ward, MD  pantoprazole (PROTONIX) 20 MG tablet Take 1 tablet (20 mg total) by mouth daily. 12/26/15 12/25/16 Yes Jene Every, MD  ondansetron (ZOFRAN-ODT) 4 MG disintegrating tablet Take 2 tablets (8 mg total) by mouth every 8 (eight) hours as needed for nausea or vomiting. 01/11/16   Chelsea C Ward, MD     BP 103/63 mmHg  Pulse 89  Temp(Src) 99.3 F (37.4 C) (Oral)  Resp 18  Ht  (1.626 m)  Wt 170 lb (77.111 kg)  BMI 29.17 kg/m2  SpO2 99% Physical Exam  Constitutional: She is oriented to person, place, and time. No distress.  Alert, nicely  groomed  HENT:  Head: Atraumatic.  B TMs mod dull, R TM is red-tinged. Mod nasal congestion, R>L Throat slightly red with post nasal drainage.  Tonsils absent.  Eyes:  Conjugate gaze, no eye redness/drainage  Neck: Neck supple.  Cardiovascular: Normal rate and regular rhythm.   Pulmonary/Chest: No respiratory distress. She has no wheezes. She has no rales.  Lungs clear, symmetric breath sounds  Abdominal: She exhibits no distension.  Musculoskeletal: Normal range of motion.  No leg swelling  Neurological: She is alert and oriented to person, place, and time.  Skin: Skin is warm and dry.  No cyanosis Maybe slightly pale.    Nursing note and vitals reviewed.   ED Course  Procedures (including critical care time)   Results for orders placed or performed during the hospital encounter of 02/13/16  Rapid strep screen  Result Value Ref Range   Streptococcus, Group A Screen (Direct) NEGATIVE NEGATIVE   Throat cx pending  MDM   1. Congestion of paranasal sinus   2. Upper respiratory infection    Nasal saline; might try a nasal steroid for congestion if ok with OB. No clear indication for antibiotics at this time.  Recheck for persistent symptoms, new fever >100.5. Await throat cx result.      Vernona Rieger  Luan MooreW Akanksha Bellmore, MD 02/14/16 1414

## 2016-02-13 NOTE — Discharge Instructions (Signed)
Nasal saline to rinse out sinuses; reasonably safe to use a nasal steroid if needed for severe symptoms.   Followup with OB as scheduled.

## 2016-02-16 LAB — CULTURE, GROUP A STREP (THRC)

## 2016-04-07 ENCOUNTER — Observation Stay
Admission: EM | Admit: 2016-04-07 | Discharge: 2016-04-07 | Disposition: A | Discharge status: Home / Self Care | Payer: Medicaid Other | Attending: Obstetrics & Gynecology | Admitting: Obstetrics & Gynecology

## 2016-04-07 ENCOUNTER — Encounter: Payer: Self-pay | Admitting: *Deleted

## 2016-04-07 DIAGNOSIS — O98812 Other maternal infectious and parasitic diseases complicating pregnancy, second trimester: Principal | ICD-10-CM | POA: Insufficient documentation

## 2016-04-07 DIAGNOSIS — N898 Other specified noninflammatory disorders of vagina: Secondary | ICD-10-CM | POA: Diagnosis present

## 2016-04-07 DIAGNOSIS — O26892 Other specified pregnancy related conditions, second trimester: Secondary | ICD-10-CM | POA: Diagnosis present

## 2016-04-07 DIAGNOSIS — Z3A21 21 weeks gestation of pregnancy: Secondary | ICD-10-CM | POA: Diagnosis not present

## 2016-04-07 DIAGNOSIS — B379 Candidiasis, unspecified: Secondary | ICD-10-CM | POA: Diagnosis not present

## 2016-04-07 NOTE — Discharge Summary (Signed)
Obstetric History and Physical  Anna Flores is a 25 y.o. [id number] with Estimated Date of Delivery: 08/17/16 who presents at 1049w1d  presenting for vaginal irritation with discharge x 1 month. Pt did not report sx at recent office visit. During prior pregnancy had severe UTI that required Rocephin injection, so pt was scared she'd have to get a shot again. Patient states she has been having no contractions, but some periumbilical abdominal cramping, no vaginal bleeding, intact membranes, with active fetal movement.    Prenatal Course Source of Care: [facility] Pregnancy complications or risks:   Patient Active Problem List   Diagnosis Date Noted  . Hyperemesis gravidarum before end of [redacted] week gestation, dehydration 02/09/2016  . Hyperemesis arising during pregnancy 01/10/2016     Prenatal Transfer Tool   Past Medical History:  Diagnosis Date  . Anxiety   . Depression     Past Surgical History:  Procedure Laterality Date  . TONSILLECTOMY      OB History  Gravida Para Term Preterm AB Living  3 2 2     2   SAB TAB Ectopic Multiple Live Births          2    # Outcome Date GA Lbr Len/2nd Weight Sex Delivery Anes PTL Lv  3 Current           2 Term 11/18/14 3485w6d  7 lb (3.175 kg) M Vag-Spont  N LIV  1 Term 02/09/13 675w3d  8 lb 6 oz (3.799 kg) M Vag-Spont  N LIV      Social History   Social History  . Marital status: Single    Spouse name: N/A  . Number of children: N/A  . Years of education: N/A   Social History Main Topics  . Smoking status: Never Smoker  . Smokeless tobacco: Never Used  . Alcohol use No  . Drug use: No  . Sexual activity: Yes    Birth control/ protection: IUD, Other-see comments     Comment: possible tubal   Other Topics Concern  . None   Social History Narrative  . None    Family History  Problem Relation Age of Onset  . Adopted: Yes    Prescriptions Prior to Admission  Medication Sig Dispense Refill Last Dose  . acetaminophen (TYLENOL) 500 MG  tablet Take 2 tablets (1,000 mg total) by mouth every 6 (six) hours as needed. 30 tablet 0 04/06/2016 at Unknown time  . Doxylamine-Pyridoxine (DICLEGIS PO) Take by mouth.   Past Week at Unknown time  . metoCLOPramide (REGLAN) 5 MG tablet Take 2 tablets (10 mg total) by mouth every 8 (eight) hours. 90 tablet 1 04/06/2016 at Unknown time  . ondansetron (ZOFRAN-ODT) 4 MG disintegrating tablet Take 2 tablets (8 mg total) by mouth every 8 (eight) hours as needed for nausea or vomiting. 90 tablet 2 04/06/2016 at Unknown time  . pantoprazole (PROTONIX) 20 MG tablet Take 1 tablet (20 mg total) by mouth daily. 30 tablet 1 04/06/2016 at Unknown time  . sertraline (ZOLOFT) 50 MG tablet Take 50 mg by mouth daily.   Past Month at Unknown time  . azithromycin (ZITHROMAX) 250 MG tablet Take 1 tablet (250 mg total) by mouth daily. Take first 2 tablets together, then 1 every day until finished. (Patient not taking: Reported on 04/07/2016) 6 tablet 0 Completed Course at Unknown time  . cefUROXime (CEFTIN) 250 MG tablet Take 1 tablet (250 mg total) by mouth 2 (two) times daily with a meal. (Patient  not taking: Reported on 04/07/2016) 20 tablet 0 Completed Course at Unknown time  . citalopram (CELEXA) 20 MG tablet Take 20 mg by mouth daily.   Not Taking at Unknown time  . cloNIDine (CATAPRES) 0.1 MG tablet Take 0.1 mg by mouth 2 (two) times daily.   Not Taking at Unknown time  . Doxylamine-Pyridoxine 10-10 MG TBEC Two tablets at bedtime on day 1 and 2; if symptoms persist, take 1 tablet in morning and 2 tablets at bedtime on day 3; if symptoms persist, may increase to 1 tablet in morning, 1 tablet mid-afternoon, and 2 tablets at bedtime on day 4 (Patient not taking: Reported on 04/07/2016) 30 tablet 0 Completed Course at Unknown time  . etodolac (LODINE) 200 MG capsule Take 1 capsule (200 mg total) by mouth every 8 (eight) hours. (Patient not taking: Reported on 04/07/2016) 12 capsule 0 Completed Course at Unknown time  . fluticasone  (FLONASE) 50 MCG/ACT nasal spray Place 1 spray into both nostrils 2 (two) times daily. (Patient not taking: Reported on 04/07/2016) 16 g 0 Not Taking at Unknown time  . methylPREDNISolone (MEDROL) 8 MG tablet Take 1 tablet (8 mg total) by mouth daily. Steroid taper:  16mg  (2 tablets) TID for 4 days then 8mg  TID for 4 days then 4mg  (1/2 tablet)TID for 4 days then BID for 4 days then daily for 4 days then stop. (Patient not taking: Reported on 04/07/2016) 48 tablet 1 Completed Course at Unknown time  . sodium chloride (OCEAN) 0.65 % SOLN nasal spray Place 2 sprays into both nostrils every 2 (two) hours while awake. (Patient not taking: Reported on 04/07/2016)  0 Not Taking at Unknown time    Allergies  Allergen Reactions  . Amoxicillin Hives  . Cherry Hives    Review of Systems: Negative except for what is mentioned in HPI.  Physical Exam: BP 109/72 (BP Location: Left Arm)   Pulse 81   Temp 98.1 F (36.7 C) (Oral)   Resp 16   Ht 5\' 4"  (1.626 m)   Wt 169 lb (76.7 kg)   LMP 11/08/2015   BMI 29.01 kg/m  GENERAL: Well-developed, well-nourished female in no acute distress.  LUNGS: unlabored breathing ABDOMEN: Soft, nontender, nondistended, gravid. EXTREMITIES: Nontender, no edema Pelvic: erythema to perineum, wet mount negative, fern & nitrizine negative FHT: + FHTs Contractions: none   Pertinent Labs/Studies:   No results found for this or any previous visit (from the past 24 hour(s)).  Assessment : IUP at [redacted]w[redacted]d, vulvovaginal yeast  Plan: Discharge Home  Rx Terazol 7 to apply externally F/u prn

## 2016-04-07 NOTE — OB Triage Note (Signed)
Vaginal discharge x 1 month, was clear. Now discharge has become yellowish and watery. Cramping noted by pt for the past week. Yesterday and tody has become worse. Anna HoopsElks, Anna Flores S

## 2016-05-01 ENCOUNTER — Observation Stay
Admission: EM | Admit: 2016-05-01 | Discharge: 2016-05-01 | Disposition: A | Discharge status: Home / Self Care | Payer: Medicaid Other | Attending: Obstetrics and Gynecology | Admitting: Obstetrics and Gynecology

## 2016-05-01 ENCOUNTER — Emergency Department: Payer: Medicaid Other

## 2016-05-01 ENCOUNTER — Encounter: Payer: Self-pay | Admitting: Emergency Medicine

## 2016-05-01 DIAGNOSIS — Z3A25 25 weeks gestation of pregnancy: Secondary | ICD-10-CM | POA: Insufficient documentation

## 2016-05-01 DIAGNOSIS — F419 Anxiety disorder, unspecified: Secondary | ICD-10-CM | POA: Insufficient documentation

## 2016-05-01 DIAGNOSIS — O479 False labor, unspecified: Secondary | ICD-10-CM | POA: Diagnosis present

## 2016-05-01 DIAGNOSIS — O4700 False labor before 37 completed weeks of gestation, unspecified trimester: Secondary | ICD-10-CM | POA: Diagnosis present

## 2016-05-01 DIAGNOSIS — O99342 Other mental disorders complicating pregnancy, second trimester: Principal | ICD-10-CM | POA: Insufficient documentation

## 2016-05-01 DIAGNOSIS — R0602 Shortness of breath: Secondary | ICD-10-CM | POA: Diagnosis present

## 2016-05-01 DIAGNOSIS — R109 Unspecified abdominal pain: Secondary | ICD-10-CM | POA: Diagnosis present

## 2016-05-01 DIAGNOSIS — O26899 Other specified pregnancy related conditions, unspecified trimester: Secondary | ICD-10-CM

## 2016-05-01 LAB — DIFFERENTIAL
BASOS ABS: 0 10*3/uL (ref 0–0.1)
Basophils Relative: 0 %
EOS PCT: 0 %
Eosinophils Absolute: 0 10*3/uL (ref 0–0.7)
LYMPHS ABS: 1.3 10*3/uL (ref 1.0–3.6)
LYMPHS PCT: 15 %
Monocytes Absolute: 0.4 10*3/uL (ref 0.2–0.9)
Monocytes Relative: 5 %
NEUTROS ABS: 6.8 10*3/uL — AB (ref 1.4–6.5)
NEUTROS PCT: 80 %

## 2016-05-01 LAB — CBC
HEMATOCRIT: 35.2 % (ref 35.0–47.0)
HEMOGLOBIN: 12.5 g/dL (ref 12.0–16.0)
MCH: 30.7 pg (ref 26.0–34.0)
MCHC: 35.6 g/dL (ref 32.0–36.0)
MCV: 86.2 fL (ref 80.0–100.0)
Platelets: 227 10*3/uL (ref 150–440)
RBC: 4.09 MIL/uL (ref 3.80–5.20)
RDW: 13.2 % (ref 11.5–14.5)
WBC: 8.6 10*3/uL (ref 3.6–11.0)

## 2016-05-01 LAB — COMPREHENSIVE METABOLIC PANEL
ALT: 11 U/L — ABNORMAL LOW (ref 14–54)
ANION GAP: 7 (ref 5–15)
AST: 15 U/L (ref 15–41)
Albumin: 3.4 g/dL — ABNORMAL LOW (ref 3.5–5.0)
Alkaline Phosphatase: 58 U/L (ref 38–126)
BILIRUBIN TOTAL: 0.4 mg/dL (ref 0.3–1.2)
CHLORIDE: 104 mmol/L (ref 101–111)
CO2: 25 mmol/L (ref 22–32)
Calcium: 8.9 mg/dL (ref 8.9–10.3)
Creatinine, Ser: 0.51 mg/dL (ref 0.44–1.00)
GFR calc Af Amer: >60 mL/min (ref >60)
Glucose, Bld: 94 mg/dL (ref 65–99)
POTASSIUM: 3.5 mmol/L (ref 3.5–5.1)
Sodium: 136 mmol/L (ref 135–145)
TOTAL PROTEIN: 7 g/dL (ref 6.5–8.1)

## 2016-05-01 LAB — HCG, QUANTITATIVE, PREGNANCY: hCG, Beta Chain, Quant, S: 4890 m[IU]/mL — ABNORMAL HIGH (ref <5)

## 2016-05-01 LAB — TROPONIN I: Troponin I: 0.01 ng/mL (ref <0.03)

## 2016-05-01 NOTE — ED Notes (Signed)
Pt returned from US

## 2016-05-01 NOTE — ED Provider Notes (Signed)
[facility] Emergency Department Provider Note        Time seen: ----------------------------------------- 12:09 PM on 05/01/2016 -----------------------------------------    I have reviewed the triage vital signs and the nursing notes.   HISTORY  Chief Complaint Shortness of Breath    HPI Anna Flores is a 25 y.o. female who presents to the ERwith shortness of breath and contractions. Patient states whenever the contractions hit she gets short of breath. Patient describes midsternal chest pain and difficulty breathing like something is sitting on her chest. Nothing makes it better, it has been persistent since last night. She denies fevers or chills or other complaints. She states she's around [redacted] weeks pregnant, she is G3.   Past Medical History:  Diagnosis Date  . Anxiety   . Depression     Patient Active Problem List   Diagnosis Date Noted  . Vaginal discharge during pregnancy in second trimester 04/07/2016  . Hyperemesis gravidarum before end of [redacted] week gestation, dehydration 02/09/2016  . Hyperemesis arising during pregnancy 01/10/2016    Past Surgical History:  Procedure Laterality Date  . TONSILLECTOMY      Allergies Amoxicillin and Cherry  Social History Social History  Substance Use Topics  . Smoking status: Never Smoker  . Smokeless tobacco: Never Used  . Alcohol use No    Review of Systems Constitutional: Negative for fever. Cardiovascular: Positive for chest pain Respiratory: Positive shortness of breath Gastrointestinal: Positive for abdominal pain and contractions Genitourinary: Negative for dysuria. Musculoskeletal: Negative for back pain. Skin: Negative for rash. Neurological: Negative for headaches, focal weakness or numbness.  10-point ROS otherwise negative.  ____________________________________________   PHYSICAL EXAM:  VITAL SIGNS: ED Triage Vitals  Enc Vitals Group     BP 05/01/16 1051 118/63   Pulse Rate 05/01/16 1051 90     Resp 05/01/16 1051 18     Temp 05/01/16 1051 98.3 F (36.8 C)     Temp Source 05/01/16 1051 Oral     SpO2 05/01/16 1051 100 %     Weight 05/01/16 1051 169 lb (76.7 kg)     Height 05/01/16 1051 5\' 4"  (1.626 m)     Head Circumference --      Peak Flow --      Pain Score 05/01/16 1205 7     Pain Loc --      Pain Edu? --      Excl. in GC? --     Constitutional: Alert and oriented. Well appearing and in no distress. Eyes: Conjunctivae are normal. PERRL. Normal extraocular movements. ENT   Head: Normocephalic and atraumatic.   Nose: No congestion/rhinnorhea.   Mouth/Throat: Mucous membranes are moist.   Neck: No stridor. Cardiovascular: Normal rate, regular rhythm. No murmurs, rubs, or gallops. Respiratory: Normal respiratory effort without tachypnea nor retractions. Breath sounds are clear and equal bilaterally. No wheezes/rales/rhonchi. Gastrointestinal: Gravid uterus, nontender Musculoskeletal: Nontender with normal range of motion in all extremities. No lower extremity tenderness nor edema. Neurologic:  Normal speech and language. No gross focal neurologic deficits are appreciated.  Skin:  Skin is warm, dry and intact. No rash noted. Psychiatric: Mood and affect are normal. Speech and behavior are normal.  ____________________________________________  EKG: Interpreted by me. Normal sinus rhythm rate 91 bpm, normal PR interval, normal QRS, normal QT interval. Normal axis. Nonspecific T-wave abnormality  ____________________________________________  ED COURSE:  Pertinent labs & imaging results that were available during my care of the patient were reviewed by me and considered  in my medical decision making (see chart for details). Clinical Course  Patient is in no distress, we will assess with basic labs and imaging if needed.  Procedures ____________________________________________   LABS (pertinent positives/negatives)  Labs  Reviewed  HCG, QUANTITATIVE, PREGNANCY - Abnormal; Notable for the following:       Result Value   hCG, Beta Chain, Quant, S 4,890 (*)    All other components within normal limits  DIFFERENTIAL - Abnormal; Notable for the following:    Neutro Abs 6.8 (*)    All other components within normal limits  CBC  CBC WITH DIFFERENTIAL/PLATELET  COMPREHENSIVE METABOLIC PANEL  TROPONIN I    RADIOLOGY  IMPRESSION: Normal examination. IMPRESSION: No evidence of deep venous thrombosis involving either the right or left lower extremity. IMPRESSION: Single viable intrauterine pregnancy at 25 weeks 4 days.  This exam is performed on an emergent basis and does not comprehensively evaluate fetal size, dating, or anatomy; follow-up complete OB US should be considered if further fetal assessment is warranted.  ____________________________________________  FINAL ASSESSMENT AND PLAN  Dyspnea, abdominal pain   Plan: Patient with labs and imaging as dictated above. It is felt unlikely that the patient has a pulmonary embolus. Ultrasound is negative for DVT, labs and chest x-ray are reassuring. [facility]. She will be sent upstairs for further monitoring for contractions. He is in stable condition at this time.   Emily FilbertWilliams, Jafet Wissing E, MD   Note: This dictation was prepared with Dragon dictation. Any transcriptional errors that result from this process are unintentional    Emily FilbertJonathan E Cathey Fredenburg, MD 05/01/16 1454

## 2016-05-01 NOTE — Discharge Summary (Signed)
Pt d/c'd home with sig other in stable condition. Verbalized understanding of d/c instructions. Follow up appointment made.

## 2016-05-01 NOTE — ED Triage Notes (Signed)
Patient reports shortness of breath with contractions. States it "feels like Braxton Hicks contractions" and "it feels like someone is choking me when it happens, like I'm losing oxygen". Patient is 24 weeks, 4 days pregnant. Patient in no acute distress.

## 2016-05-01 NOTE — Discharge Summary (Signed)
Physician Final Progress Note  Patient ID: Anna Flores MRN: [id number] DOB/AGE: [birth year] 23 y.o.  Admit date: 05/01/2016 Admitting provider: Vena Austria, MD Discharge date: 05/01/2016   Admission Diagnoses: Anxiety  Discharge Diagnoses:  Active Problems:   Preterm contractions Anxiety  Consults: None  Significant Findings/ Diagnostic Studies:  Results for orders placed or performed during the hospital encounter of 05/01/16 (from the past 24 hour(s))  hCG, quantitative, pregnancy     Status: Abnormal   Collection Time: 05/01/16 12:41 PM  Result Value Ref Range   hCG, Beta Chain, Quant, S 4,890 (H) <5 mIU/mL  CBC     Status: None   Collection Time: 05/01/16 12:41 PM  Result Value Ref Range   WBC 8.6 3.6 - 11.0 K/uL   RBC 4.09 3.80 - 5.20 MIL/uL   Hemoglobin 12.5 12.0 - 16.0 g/dL   HCT 57.8 46.9 - 62.9 %   MCV 86.2 80.0 - 100.0 fL   MCH 30.7 26.0 - 34.0 pg   MCHC 35.6 32.0 - 36.0 g/dL   RDW 52.8 41.3 - 24.4 %   Platelets 227 150 - 440 K/uL  Comprehensive metabolic panel     Status: Abnormal   Collection Time: 05/01/16 12:41 PM  Result Value Ref Range   Sodium 136 135 - 145 mmol/L   Potassium 3.5 3.5 - 5.1 mmol/L   Chloride 104 101 - 111 mmol/L   CO2 25 22 - 32 mmol/L   Glucose, Bld 94 65 - 99 mg/dL   BUN <5 (L) 6 - 20 mg/dL   Creatinine, Ser 0.10 0.44 - 1.00 mg/dL   Calcium 8.9 8.9 - 27.2 mg/dL   Total Protein 7.0 6.5 - 8.1 g/dL   Albumin 3.4 (L) 3.5 - 5.0 g/dL   AST 15 15 - 41 U/L   ALT 11 (L) 14 - 54 U/L   Alkaline Phosphatase 58 38 - 126 U/L   Total Bilirubin 0.4 0.3 - 1.2 mg/dL   GFR calc non Af Amer >60 >60 mL/min   GFR calc Af Amer >60 >60 mL/min   Anion gap 7 5 - 15  Differential     Status: Abnormal   Collection Time: 05/01/16 12:41 PM  Result Value Ref Range   Neutrophils Relative % 80 %   Neutro Abs 6.8 (H) 1.4 - 6.5 K/uL   Lymphocytes Relative 15 %   Lymphs Abs 1.3 1.0 - 3.6 K/uL   Monocytes Relative 5 %   Monocytes Absolute 0.4 0.2 - 0.9  K/uL   Eosinophils Relative 0 %   Eosinophils Absolute 0.0 0 - 0.7 K/uL   Basophils Relative 0 %   Basophils Absolute 0.0 0 - 0.1 K/uL  Troponin I     Status: None   Collection Time: 05/01/16 12:41 PM  Result Value Ref Range   Troponin I 0.01 <0.03 ng/mL   Dg Chest 2 View  Result Date: 05/01/2016 CLINICAL DATA:  25 year old presenting with acute onset of sharp left-sided chest pain that began yesterday, associated with shortness of breath. Patient is approximately [redacted] weeks pregnant. EXAM: CHEST  2 VIEW COMPARISON:  09/21/2013. FINDINGS: The patient was double shielded for the examination. Cardiomediastinal silhouette unremarkable, unchanged. Lungs clear. Bronchovascular markings normal. Pulmonary vascularity normal. No visible pleural effusions. No pneumothorax. Visualized bony thorax intact. IMPRESSION: Normal examination. Electronically Signed   By: Hulan Saas M.D.   On: 05/01/2016 14:01   US Ob Limited  Result Date: 05/01/2016 CLINICAL DATA:  Chest pain.  Shortness of breath.  Contractions. EXAM: LIMITED OBSTETRIC ULTRASOUND FINDINGS: Number of Fetuses:  Single Heart Rate:  136 bpm Movement:  Present Presentation: Cephalic Placental Location: Anterior Previa: None Amniotic Fluid (Subjective):  Normal BPD:  6.3cm 25w  4d MATERNAL FINDINGS: Cervix:  4.2 cm Appears closed. Uterus/Adnexae:  No abnormality visualized. IMPRESSION: Single viable intrauterine pregnancy at 25 weeks 4 days. This exam is performed on an emergent basis and does not comprehensively evaluate fetal size, dating, or anatomy; follow-up complete OB US should be considered if further fetal assessment is warranted. Electronically Signed   By: Maisie Fus  Register   On: 05/01/2016 13:58   US Venous Img Lower Bilateral  Result Date: 05/01/2016 CLINICAL DATA:  25 year old presenting with left-sided chest pain and shortness of breath and lower extremity pain. EXAM: BILATERAL LOWER EXTREMITY VENOUS DOPPLER ULTRASOUND TECHNIQUE:  Gray-scale sonography with graded compression, as well as color Doppler and duplex ultrasound were performed to evaluate the lower extremity deep venous systems from the level of the common femoral vein and including the common femoral, femoral, profunda femoral, popliteal and calf veins including the posterior tibial, peroneal and gastrocnemius veins when visible. The superficial great saphenous vein was also interrogated. Spectral Doppler was utilized to evaluate flow at rest and with distal augmentation maneuvers in the common femoral, femoral and popliteal veins. COMPARISON:  Left lower extremity venous Doppler ultrasound 10/22/2014, 12/18/2012. No prior right lower extremity venous Doppler ultrasound. FINDINGS: RIGHT LOWER EXTREMITY Common Femoral Vein: No evidence of thrombus. Normal compressibility, respiratory phasicity and response to augmentation. Saphenofemoral Junction: No evidence of thrombus. Normal compressibility and flow on color Doppler imaging. Profunda Femoral Vein: No evidence of thrombus. Normal compressibility and flow on color Doppler imaging. Femoral Vein: No evidence of thrombus. Normal compressibility, respiratory phasicity and response to augmentation. Popliteal Vein: No evidence of thrombus. Normal compressibility, respiratory phasicity and response to augmentation. Calf Veins: No evidence of thrombus. Normal compressibility and flow on color Doppler imaging. Superficial Great Saphenous Vein: Not evaluated. Venous Reflux:  Not evaluated. Other Findings:  None. LEFT LOWER EXTREMITY Common Femoral Vein: No evidence of thrombus. Normal compressibility, respiratory phasicity and response to augmentation. Saphenofemoral Junction: No evidence of thrombus. Normal compressibility and flow on color Doppler imaging. Profunda Femoral Vein: No evidence of thrombus. Normal compressibility and flow on color Doppler imaging. Femoral Vein: No evidence of thrombus. Normal compressibility, respiratory  phasicity and response to augmentation. Popliteal Vein: No evidence of thrombus. Normal compressibility, respiratory phasicity and response to augmentation. Calf Veins: No evidence of thrombus. Normal compressibility and flow on color Doppler imaging. Superficial Great Saphenous Vein: Not evaluated. Venous Reflux:  Not evaluated. Other Findings:  None. IMPRESSION: No evidence of deep venous thrombosis involving either the right or left lower extremity. Electronically Signed   By: Hulan Saas M.D.   On: 05/01/2016 14:04   Procedures: NST 140, moderate variability, 10x10 accels, no decels, no contractions on tocometry  Discharge Condition: good  Disposition: 01-Home or Self Care  Diet: Regular diet  Discharge Activity: Activity as tolerated  Discharge Instructions    Discharge activity:  No Restrictions    Complete by:  As directed    Discharge diet:  No restrictions    Complete by:  As directed    No sexual activity restrictions    Complete by:  As directed    Notify physician for a general feeling that "something is not right"    Complete by:  As directed    Notify physician for increase or change in vaginal discharge  Complete by:  As directed    Notify physician for intestinal cramps, with or without diarrhea, sometimes described as "gas pain"    Complete by:  As directed    Notify physician for leaking of fluid    Complete by:  As directed    Notify physician for low, dull backache, unrelieved by heat or Tylenol    Complete by:  As directed    Notify physician for menstrual like cramps    Complete by:  As directed    Notify physician for pelvic pressure    Complete by:  As directed    Notify physician for uterine contractions.  These may be painless and feel like the uterus is tightening or the baby is  "balling up"    Complete by:  As directed    Notify physician for vaginal bleeding    Complete by:  As directed    PRETERM LABOR:  Includes any of the follwing symptoms  that occur between 20 - [redacted] weeks gestation.  If these symptoms are not stopped, preterm labor can result in preterm delivery, placing your baby at risk    Complete by:  As directed        Medication List    STOP taking these medications   acetaminophen 500 MG tablet Commonly known as:  TYLENOL   azithromycin 250 MG tablet Commonly known as:  ZITHROMAX   cefUROXime 250 MG tablet Commonly known as:  CEFTIN   citalopram 20 MG tablet Commonly known as:  CELEXA   cloNIDine 0.1 MG tablet Commonly known as:  CATAPRES   DICLEGIS PO   Doxylamine-Pyridoxine 10-10 MG Tbec   etodolac 200 MG capsule Commonly known as:  LODINE   fluticasone 50 MCG/ACT nasal spray Commonly known as:  FLONASE   methylPREDNISolone 8 MG tablet Commonly known as:  MEDROL   metoCLOPramide 5 MG tablet Commonly known as:  REGLAN   ondansetron 4 MG disintegrating tablet Commonly known as:  ZOFRAN-ODT     TAKE these medications   pantoprazole 20 MG tablet Commonly known as:  PROTONIX Take 1 tablet (20 mg total) by mouth daily.   sertraline 50 MG tablet Commonly known as:  ZOLOFT Take 50 mg by mouth daily.   sodium chloride 0.65 % Soln nasal spray Commonly known as:  OCEAN Place 2 sprays into both nostrils every 2 (two) hours while awake.      Had stopped Zoloft in 1st trimester because of nausea, instructed to restart  Total time spent taking care of this patient: 15 minutes  Signed: Lorrene ReidSTAEBLER, Antwann Preziosi M 05/01/2016, 3:57 PM

## 2016-05-01 NOTE — ED Notes (Signed)
Patient transported to X-ray 

## 2016-05-01 NOTE — Discharge Instructions (Signed)
Follow up with your provider at your scheduled appointment. Call [facility] to make an earlier appointment if symptoms persist or worsen.   Drink plenty of fluid and get plenty of rest.

## 2016-05-01 NOTE — ED Notes (Signed)
Pt stating that she began to be SOB and have tightness in neck and chest last night. Pt stating that the pain has been consistent. Pt does have nausea but is stating that she has had nausea chronically with this current pregnancy. Respirations are even and WNL. Pt is in NAD at this time. Color and cap refill <3 sec

## 2016-05-25 ENCOUNTER — Observation Stay
Admission: EM | Admit: 2016-05-25 | Discharge: 2016-05-25 | Disposition: A | Discharge status: Home / Self Care | Payer: Medicaid Other | Attending: Obstetrics and Gynecology | Admitting: Obstetrics and Gynecology

## 2016-05-25 DIAGNOSIS — Z3A28 28 weeks gestation of pregnancy: Secondary | ICD-10-CM | POA: Insufficient documentation

## 2016-05-25 DIAGNOSIS — F329 Major depressive disorder, single episode, unspecified: Secondary | ICD-10-CM | POA: Diagnosis not present

## 2016-05-25 DIAGNOSIS — O99343 Other mental disorders complicating pregnancy, third trimester: Secondary | ICD-10-CM | POA: Diagnosis not present

## 2016-05-25 DIAGNOSIS — O42913 Preterm premature rupture of membranes, unspecified as to length of time between rupture and onset of labor, third trimester: Secondary | ICD-10-CM | POA: Diagnosis present

## 2016-05-25 DIAGNOSIS — O4290 Premature rupture of membranes, unspecified as to length of time between rupture and onset of labor, unspecified weeks of gestation: Secondary | ICD-10-CM | POA: Diagnosis present

## 2016-05-25 LAB — URINALYSIS COMPLETE WITH MICROSCOPIC (ARMC ONLY)
Bilirubin Urine: NEGATIVE
GLUCOSE, UA: NEGATIVE mg/dL
HGB URINE DIPSTICK: NEGATIVE
Ketones, ur: NEGATIVE mg/dL
NITRITE: NEGATIVE
PROTEIN: NEGATIVE mg/dL
SPECIFIC GRAVITY, URINE: 1.008 (ref 1.005–1.030)
pH: 6 (ref 5.0–8.0)

## 2016-05-25 LAB — COMPREHENSIVE METABOLIC PANEL
ALBUMIN: 3.2 g/dL — AB (ref 3.5–5.0)
ALK PHOS: 72 U/L (ref 38–126)
ALT: 10 U/L — ABNORMAL LOW (ref 14–54)
ANION GAP: 6 (ref 5–15)
AST: 15 U/L (ref 15–41)
BUN: 5 mg/dL — ABNORMAL LOW (ref 6–20)
CHLORIDE: 105 mmol/L (ref 101–111)
CO2: 24 mmol/L (ref 22–32)
Calcium: 8.8 mg/dL — ABNORMAL LOW (ref 8.9–10.3)
Creatinine, Ser: 0.55 mg/dL (ref 0.44–1.00)
GFR calc non Af Amer: >60 mL/min (ref >60)
GLUCOSE: 98 mg/dL (ref 65–99)
POTASSIUM: 3.4 mmol/L — AB (ref 3.5–5.1)
SODIUM: 135 mmol/L (ref 135–145)
Total Bilirubin: 0.6 mg/dL (ref 0.3–1.2)
Total Protein: 6.7 g/dL (ref 6.5–8.1)

## 2016-05-25 LAB — CBC
HEMATOCRIT: 32.3 % — AB (ref 35.0–47.0)
HEMOGLOBIN: 11.4 g/dL — AB (ref 12.0–16.0)
MCH: 30.1 pg (ref 26.0–34.0)
MCHC: 35.4 g/dL (ref 32.0–36.0)
MCV: 85.2 fL (ref 80.0–100.0)
Platelets: 205 10*3/uL (ref 150–440)
RBC: 3.8 MIL/uL (ref 3.80–5.20)
RDW: 13.2 % (ref 11.5–14.5)
WBC: 8.8 10*3/uL (ref 3.6–11.0)

## 2016-05-25 LAB — PROTEIN / CREATININE RATIO, URINE: Creatinine, Urine: 48 mg/dL

## 2016-05-25 MED ORDER — CALCIUM CARBONATE ANTACID 500 MG PO CHEW: 2.0000 | CHEWABLE_TABLET | ORAL | Status: DC | PRN

## 2016-05-25 MED ORDER — ACETAMINOPHEN 325 MG PO TABS: 650.0000 mg | ORAL_TABLET | ORAL | Status: DC | PRN

## 2016-05-25 MED ORDER — DOCUSATE SODIUM 100 MG PO CAPS: 100.0000 mg | ORAL_CAPSULE | Freq: Every day | ORAL | Status: DC

## 2016-05-25 NOTE — OB Triage Note (Signed)
[name] here with c/o LOF since 6711 Am yesterday, Oct. 20, reports fluid as clear, enough to wet underwear and need to change multiple times, sweet scent. Nitrazine negative, no discharge seen at this time.

## 2016-05-26 NOTE — Discharge Summary (Signed)
Expand All Collapse All   [] Hide copied text [] Hover for attribution information Obstetric History and Physical  Anna Flores is a 25 y.o. [id number] with Estimated Date of Delivery: 08/17/16 per 7 week ultrasound who presents at [redacted]w[redacted]d  presenting for leaking of fluid. Patient states she denies contractions, denies vaginal bleeding,  with a slight decrease in fetal movement, but is still active.  She complains of a "lightening bolt" feeling from her bellybutton to her pubic bone, noted without position changes.  She complains of feeling flushed, like she has "no enegy", clammy hands and no appetite.  She has a headache behind her right eye 7/10, associated with seeing spots.  Denies RUQ pain.  She has had migraines in the past.    Prenatal Course Source of Care: [facility]  with onset of care at 7 weeks Pregnancy complications or risks:     Patient Active Problem List   Diagnosis Date Noted  . Amniotic fluid leaking 05/25/2016  . Preterm contractions 05/01/2016  . Vaginal discharge during pregnancy in second trimester 04/07/2016  . Hyperemesis gravidarum before end of [redacted] week gestation, dehydration 02/09/2016  . Hyperemesis arising during pregnancy 01/10/2016    Prenatal Transfer Tool       Past Medical History:  Diagnosis Date  . Anxiety   . Depression          Past Surgical History:  Procedure Laterality Date  . TONSILLECTOMY                      OB History  Gravida Para Term Preterm AB Living  3 2 2     2   SAB TAB Ectopic Multiple Live Births          2    # Outcome Date GA Lbr Len/2nd Weight Sex Delivery Anes PTL Lv  3 Current           2 Term 11/18/14 [redacted]w[redacted]d  3.175 kg (7 lb) M Vag-Spont  N LIV  1 Term 02/09/13 [redacted]w[redacted]d  3.799 kg (8 lb 6 oz) M Vag-Spont  N LIV      Social History        Social History  . Marital status: Single    Spouse name: N/A  . Number of children: N/A  . Years of education: N/A         Social History Main  Topics  . Smoking status: Never Smoker  . Smokeless tobacco: Never Used  . Alcohol use No  . Drug use: No  . Sexual activity: Yes    Birth control/ protection: IUD, Other-see comments     Comment: possible tubal       Other Topics Concern  . Not on file      Social History Narrative  . No narrative on file         Family History  Problem Relation Age of Onset  . Adopted: Yes    No prescriptions prior to admission.        Allergies  Allergen Reactions  . Amoxicillin Hives  . Cherry Hives    Review of Systems: Negative except for what is mentioned in HPI.  Physical Exam: BP 115/73 (BP Location: Left Arm)   Pulse (!) 104   Temp 98.1 F (36.7 C) (Oral)   Resp 16   LMP 11/08/2015  GENERAL: Well-developed, well-nourished female in no acute distress.  LUNGS: Clear to auscultation bilaterally.  HEART: Regular rate and rhythm.  No Murmurs.  ABDOMEN: Soft,  tender at umbilicus and RLQ, nondistendeded, gravid.  Soreness at right CVA. EXTREMITIES: Nontender, no edema Cervical Exam: Dilatation 0 cm   Effacement 20 %   Station -3   Negative wet mount, negative nitrazine, negative fern, negative pooling Presentation: cephalic FHT: Category: 1 Baseline rate 145 bpm   Variability moderate  Accelerations present to 160's Decelerations none Contractions: None    Pertinent Labs/Studies:   Lab Results Last 24 Hours       Results for orders placed or performed during the hospital encounter of 05/25/16 (from the past 24 hour(s))  Urinalysis complete, with microscopic ([facility] only)     Status: Abnormal   Collection Time: 05/25/16  3:06 PM  Result Value Ref Range   Color, Urine YELLOW (A) YELLOW   APPearance CLOUDY (A) CLEAR   Glucose, UA NEGATIVE NEGATIVE mg/dL   Bilirubin Urine NEGATIVE NEGATIVE   Ketones, ur NEGATIVE NEGATIVE mg/dL   Specific Gravity, Urine 1.008 1.005 - 1.030   Hgb urine dipstick NEGATIVE NEGATIVE   pH 6.0 5.0 - 8.0   Protein,  ur NEGATIVE NEGATIVE mg/dL   Nitrite NEGATIVE NEGATIVE   Leukocytes, UA 3+ (A) NEGATIVE   RBC / HPF 0-5 0 - 5 RBC/hpf   WBC, UA 6-30 0 - 5 WBC/hpf   Bacteria, UA MANY (A) NONE SEEN   Squamous Epithelial / LPF 6-30 (A) NONE SEEN   Mucous PRESENT   Protein / creatinine ratio, urine     Status: None   Collection Time: 05/25/16  3:06 PM  Result Value Ref Range   Creatinine, Urine 48 mg/dL   Total Protein, Urine <6 mg/dL   Protein Creatinine Ratio        0.00 - 0.15 mg/mg[Cre]  CBC     Status: Abnormal   Collection Time: 05/25/16  3:33 PM  Result Value Ref Range   WBC 8.8 3.6 - 11.0 K/uL   RBC 3.80 3.80 - 5.20 MIL/uL   Hemoglobin 11.4 (L) 12.0 - 16.0 g/dL   HCT 16.132.3 (L) 09.635.0 - 04.547.0 %   MCV 85.2 80.0 - 100.0 fL   MCH 30.1 26.0 - 34.0 pg   MCHC 35.4 32.0 - 36.0 g/dL   RDW 40.913.2 81.111.5 - 91.414.5 %   Platelets 205 150 - 440 K/uL  Comprehensive metabolic panel     Status: Abnormal   Collection Time: 05/25/16  3:36 PM  Result Value Ref Range   Sodium 135 135 - 145 mmol/L   Potassium 3.4 (L) 3.5 - 5.1 mmol/L   Chloride 105 101 - 111 mmol/L   CO2 24 22 - 32 mmol/L   Glucose, Bld 98 65 - 99 mg/dL   BUN 5 (L) 6 - 20 mg/dL   Creatinine, Ser 7.820.55 0.44 - 1.00 mg/dL   Calcium 8.8 (L) 8.9 - 10.3 mg/dL   Total Protein 6.7 6.5 - 8.1 g/dL   Albumin 3.2 (L) 3.5 - 5.0 g/dL   AST 15 15 - 41 U/L   ALT 10 (L) 14 - 54 U/L   Alkaline Phosphatase 72 38 - 126 U/L   Total Bilirubin 0.6 0.3 - 1.2 mg/dL   GFR calc non Af Amer >60 >60 mL/min   GFR calc Af Amer >60 >60 mL/min   Anion gap 6 5 - 15      Assessment : IUP at 4172w0d with intact membranes.  Urinalysis with many bacteria.  Plan: 1. Macrobid 100mg  PO BID x 7 days.  Urine culture pending. 2. Vitals, CMP, CBC, PC  Ratio WNL, no evidence of pre-eclampsia.   3. Pt reporting symptoms improved after resting and hydration - encouraged to continue.  3. Preterm labor precautions.     Marta Antu, CNM, seen  with Tonye Pearson [facility]

## 2016-05-27 LAB — URINE CULTURE: SPECIAL REQUESTS: NORMAL

## 2016-06-03 ENCOUNTER — Emergency Department
Admission: EM | Admit: 2016-06-03 | Discharge: 2016-06-03 | Disposition: A | Discharge status: Home / Self Care | Payer: Medicaid Other | Attending: Emergency Medicine | Admitting: Emergency Medicine

## 2016-06-03 ENCOUNTER — Encounter: Payer: Self-pay | Admitting: Emergency Medicine

## 2016-06-03 DIAGNOSIS — Z20818 Contact with and (suspected) exposure to other bacterial communicable diseases: Secondary | ICD-10-CM | POA: Diagnosis not present

## 2016-06-03 DIAGNOSIS — Z79899 Other long term (current) drug therapy: Secondary | ICD-10-CM | POA: Diagnosis not present

## 2016-06-03 DIAGNOSIS — J069 Acute upper respiratory infection, unspecified: Secondary | ICD-10-CM

## 2016-06-03 DIAGNOSIS — R05 Cough: Secondary | ICD-10-CM | POA: Diagnosis present

## 2016-06-03 LAB — POCT RAPID STREP A: Streptococcus, Group A Screen (Direct): NEGATIVE

## 2016-06-03 MED ORDER — AZITHROMYCIN 250 MG PO TABS: ORAL_TABLET | ORAL | 0 refills | Status: DC

## 2016-06-03 NOTE — ED Provider Notes (Signed)
[facility] Emergency Department Provider Note  ____________________________________________  Time seen: Approximately 9:13 AM  I have reviewed the triage vital signs and the nursing notes.   HISTORY  Chief Complaint Cough and sinus congestion   HPI Anna Flores is a 25 y.o. [redacted] week pregnant female who presents to the emergency department for evaluation of cough, sinus congestion, and sore throat that started 5 days ago. She reports that her other children have had strep throat recently. Her OB told her last week that her symptoms were likely viral. This morning her throat feels worse and she believes she has had a fever. Cough has been productive over the past 3 days.   Past Medical History:  Diagnosis Date  . Anxiety   . Depression     Patient Active Problem List   Diagnosis Date Noted  . Amniotic fluid leaking 05/25/2016  . Preterm contractions 05/01/2016  . Vaginal discharge during pregnancy in second trimester 04/07/2016  . Hyperemesis gravidarum before end of [redacted] week gestation, dehydration 02/09/2016  . Hyperemesis arising during pregnancy 01/10/2016    Past Surgical History:  Procedure Laterality Date  . TONSILLECTOMY      Prior to Admission medications   Medication Sig Start Date End Date Taking? Authorizing Provider  azithromycin (ZITHROMAX) 250 MG tablet 2 tablets today, then 1 tablet for the next 4 days. 06/03/16   Chinita Pesterari B Verniece Encarnacion, FNP  pantoprazole (PROTONIX) 20 MG tablet Take 1 tablet (20 mg total) by mouth daily. 12/26/15 12/25/16  Jene Everyobert Kinner, MD  sertraline (ZOLOFT) 50 MG tablet Take 50 mg by mouth daily.    Historical Provider, MD  sodium chloride (OCEAN) 0.65 % SOLN nasal spray Place 2 sprays into both nostrils every 2 (two) hours while awake. Patient not taking: Reported on 04/07/2016 04/15/15   Barbaraann Barthelina A Betancourt, NP    Allergies Amoxicillin and Cherry  Family History  Problem Relation Age of Onset  . Adopted: Yes    Social  History Social History  Substance Use Topics  . Smoking status: Never Smoker  . Smokeless tobacco: Never Used  . Alcohol use No    Review of Systems Constitutional: Positive fever/chills ENT: Positive for sore throat. Cardiovascular: Denies chest pain. Respiratory: Negative for shortness of breath. Positive for cough. Gastrointestinal: No nausea,  no vomiting.  No diarrhea.  Musculoskeletal: Positive for body aches Skin: Negative for rash. Neurological: Negative for headaches ____________________________________________   PHYSICAL EXAM:  VITAL SIGNS: ED Triage Vitals  Enc Vitals Group     BP 06/03/16 0843 (!) 106/57     Pulse Rate 06/03/16 0843 95     Resp 06/03/16 0843 16     Temp 06/03/16 0843 98.3 F (36.8 C)     Temp Source 06/03/16 0843 Oral     SpO2 06/03/16 0843 98 %     Weight 06/03/16 0845 179 lb (81.2 kg)     Height 06/03/16 0845 5\' 4"  (1.626 m)     Head Circumference --      Peak Flow --      Pain Score 06/03/16 0845 7     Pain Loc --      Pain Edu? --      Excl. in GC? --     Constitutional: Alert and oriented. Well appearing and in no acute distress. Eyes: Conjunctivae are normal. EOMI. Ears: Bilateral TM normal Nose: Maxillary sinus congestion; no rhinnorhea. Mouth/Throat: Mucous membranes are moist.  Oropharynx erythematous. Tonsils not visible. Neck: No stridor.  Lymphatic: No cervical lymphadenopathy. Cardiovascular: Normal rate, regular rhythm. Grossly normal heart sounds.  Good peripheral circulation. Respiratory: Normal respiratory effort.  No retractions. Clear to auscultation. Gastrointestinal: Soft and nontender.  Musculoskeletal: FROM x 4 extremities.  Neurologic:  Normal speech and language.  Skin:  Skin is warm, dry and intact. No rash noted. Psychiatric: Mood and affect are normal. Speech and behavior are normal.  ____________________________________________   LABS (all labs ordered are listed, but only abnormal results are  displayed)  Labs Reviewed  CULTURE, GROUP A STREP [facility]  POCT RAPID STREP A   ____________________________________________  EKG   ____________________________________________  RADIOLOGY   ____________________________________________   PROCEDURES  Procedure(s) performed: None  Critical Care performed: No  ____________________________________________   INITIAL IMPRESSION / ASSESSMENT AND PLAN / ED COURSE  Clinical Course    Pertinent labs & imaging results that were available during my care of the patient were reviewed by me and considered in my medical decision making (see chart for details).   Azithromycin Rx given. Patient to follow up with the OB for symptoms that are not improving over the next 48 hours. She was advised to return to the ER for symptoms that change or worsen if unable to schedule an appointment. ____________________________________________   FINAL CLINICAL IMPRESSION(S) / ED DIAGNOSES  Final diagnoses:  Upper respiratory tract infection, unspecified type  Streptococcus exposure    Note:  This document was prepared using Dragon voice recognition software and may include unintentional dictation errors.     Chinita PesterCari B Alexsander Cavins, FNP 06/03/16 [id number]    Jene Everyobert Kinner, MD 06/03/16 [phone]

## 2016-06-03 NOTE — ED Triage Notes (Signed)
C/O cough and sinus congestion for a couple of day.  Coughing productively.  Children have been sick with strep throat.  Patient is [redacted] weeks pregnant.  Denies abdominal pain.  Patient has good fetal movement.

## 2016-06-03 NOTE — ED Notes (Signed)
States she developed body aches,sore throat and cough for several days was seen by OB md last week and told that it may be viral  But states she had fever this am and increased sore throat

## 2016-06-06 LAB — CULTURE, GROUP A STREP (THRC)

## 2016-06-11 ENCOUNTER — Encounter: Payer: Self-pay | Admitting: Dietician

## 2016-06-11 ENCOUNTER — Encounter: Payer: Medicaid Other | Attending: Advanced Practice Midwife | Admitting: Dietician

## 2016-06-11 VITALS — BP 100/64 | Ht 64.0 in | Wt 187.6 lb

## 2016-06-11 DIAGNOSIS — O24419 Gestational diabetes mellitus in pregnancy, unspecified control: Secondary | ICD-10-CM | POA: Diagnosis not present

## 2016-06-11 DIAGNOSIS — O24410 Gestational diabetes mellitus in pregnancy, diet controlled: Secondary | ICD-10-CM

## 2016-06-11 DIAGNOSIS — Z713 Dietary counseling and surveillance: Secondary | ICD-10-CM | POA: Insufficient documentation

## 2016-06-11 NOTE — Patient Instructions (Signed)
Read booklet on Gestational Diabetes Follow Gestational Meal Planning Guidelines Complete a 3 Day Food Record and bring to next appointment Check blood sugars 4 x day - before breakfast and 2 hrs after every meal and record  Bring blood sugar log to all appointments Purchase urine ketone strips Check urine ketones every am:  If + increase bedtime snack to 1 protein and 2 carbohydrate servings Walk 20-30 minutes at least 5 x week if permitted by MD Next appointment  06-20-16

## 2016-06-11 NOTE — Progress Notes (Signed)
Appt. Start Time: 1430 Appt. End Time: 1540  GDM Class 1 Diabetes Overview - define DM; state own type of DM; identify functions of pancreas and insulin; define insulin deficiency vs insulin resistance  Psychosocial - identify DM as a source of stress; state the effects of stress on BG control; verbalize appropriate stress management techniques; identify personal stress issues   Nutritional Management - describe effects of food on blood glucose; identify sources of carbohydrate, protein and fat; verbalize the importance of balance meals in controlling blood glucose; identify meals as well balanced or not; estimate servings of carbohydrate from menus; use food labels to identify servings size, content of carbohydrate, fiber, protein, fat, saturated fat and sodium; recognize food sources of fat, saturated fat, trans fat, sodium and verbalize goals for intake; describe healthful appropriate food choices when dining out   Exercise - describe the effects of exercise on blood glucose and importance of regular exercise in controlling diabetes; state a plan for personal exercise; verbalize contraindications for exercise  Medications - state name, dose, timing of currently prescribed medications; describe types of medications available for diabetes  Insulin Training - prepare and administer insulin accurately; state correct rotation pattern; verbalize safe and lawful needle disposal  Self-Monitoring - state importance of HBGM and demo procedure accurately; use HBGM results to effectively manage diabetes; identify importance of regular HbA1C testing and goals for results  Acute Complications/Sick Day Guidelines - recognize hyperglycemia and hypoglycemia with causes and effects; identify blood glucose results as high, low or in control; list steps in treating and preventing high and low blood glucose; state appropriate measure to manage blood glucose when ill (need for meds, HBGM plan, when to call physician,  need for fluids)  Chronic Complications/Foot, Skin, Eye Dental Care - identify possible long-term complications of diabetes (retinopathy, neuropathy, nephropathy, cardiovascular disease, infections); explain steps in prevention and treatment of chronic complications; state importance of daily self-foot exams; describe how to examine feet and what to look for; explain appropriate eye and dental care  Lifestyle Changes/Goals & Health/Community Resources - state benefits of making appropriate lifestyle changes; identify habits that need to change (meals, tobacco, alcohol); identify strategies to reduce risk factors for personal health; set goals for proper diabetes care; state need for and frequency of healthcare follow-up; describe appropriate community resources for good health (ADA, web sites, apps)   Pregnancy/Sexual Health - define gestational diabetes; state importance of good blood glucose control and birth control prior to pregnancy; state importance of good blood glucose control in preventing sexual problems (impotence, vaginal dryness, infections, loss of desire); state relationship of blood glucose control and pregnancy outcome; describe risk of maternal and fetal complications  Teaching Materials Used:  General Meal Planning Guidelines Daily Food Record Gestational Diabetes Booklet Gestational Video Goals for Healthy Pregnancy

## 2016-06-18 ENCOUNTER — Ambulatory Visit: Payer: Medicaid Other | Admitting: Dietician

## 2016-06-18 ENCOUNTER — Encounter: Payer: Self-pay | Admitting: Dietician

## 2016-06-18 NOTE — Progress Notes (Signed)
Received message from coworker that pt called on 06-14-16 and cancelled her FU appointment on 06-18-16.  Called pt but pt unable to talk due to being at  trauma center with child

## 2016-06-20 ENCOUNTER — Observation Stay
Admission: EM | Admit: 2016-06-20 | Discharge: 2016-06-20 | Disposition: A | Discharge status: Home / Self Care | Payer: Medicaid Other | Attending: Certified Nurse Midwife | Admitting: Certified Nurse Midwife

## 2016-06-20 DIAGNOSIS — F329 Major depressive disorder, single episode, unspecified: Secondary | ICD-10-CM | POA: Insufficient documentation

## 2016-06-20 DIAGNOSIS — O219 Vomiting of pregnancy, unspecified: Secondary | ICD-10-CM | POA: Diagnosis present

## 2016-06-20 DIAGNOSIS — R55 Syncope and collapse: Secondary | ICD-10-CM | POA: Diagnosis not present

## 2016-06-20 DIAGNOSIS — O99213 Obesity complicating pregnancy, third trimester: Secondary | ICD-10-CM | POA: Diagnosis not present

## 2016-06-20 DIAGNOSIS — Z3A31 31 weeks gestation of pregnancy: Secondary | ICD-10-CM | POA: Diagnosis not present

## 2016-06-20 DIAGNOSIS — O26893 Other specified pregnancy related conditions, third trimester: Secondary | ICD-10-CM | POA: Diagnosis not present

## 2016-06-20 DIAGNOSIS — K529 Noninfective gastroenteritis and colitis, unspecified: Secondary | ICD-10-CM | POA: Diagnosis not present

## 2016-06-20 DIAGNOSIS — Y92002 Bathroom of unspecified non-institutional (private) residence single-family (private) house as the place of occurrence of the external cause: Secondary | ICD-10-CM | POA: Diagnosis not present

## 2016-06-20 DIAGNOSIS — E669 Obesity, unspecified: Secondary | ICD-10-CM | POA: Diagnosis not present

## 2016-06-20 DIAGNOSIS — W19XXXA Unspecified fall, initial encounter: Secondary | ICD-10-CM | POA: Insufficient documentation

## 2016-06-20 DIAGNOSIS — F419 Anxiety disorder, unspecified: Secondary | ICD-10-CM | POA: Insufficient documentation

## 2016-06-20 DIAGNOSIS — O99343 Other mental disorders complicating pregnancy, third trimester: Secondary | ICD-10-CM | POA: Diagnosis not present

## 2016-06-20 DIAGNOSIS — M25512 Pain in left shoulder: Secondary | ICD-10-CM | POA: Diagnosis not present

## 2016-06-20 DIAGNOSIS — Z79899 Other long term (current) drug therapy: Secondary | ICD-10-CM | POA: Diagnosis not present

## 2016-06-20 DIAGNOSIS — R112 Nausea with vomiting, unspecified: Secondary | ICD-10-CM | POA: Diagnosis present

## 2016-06-20 LAB — URINALYSIS COMPLETE WITH MICROSCOPIC (ARMC ONLY)
BILIRUBIN URINE: NEGATIVE
Bacteria, UA: NONE SEEN
GLUCOSE, UA: NEGATIVE mg/dL
HGB URINE DIPSTICK: NEGATIVE
Nitrite: NEGATIVE
Protein, ur: 30 mg/dL — AB
Specific Gravity, Urine: 1.026 (ref 1.005–1.030)
pH: 6 (ref 5.0–8.0)

## 2016-06-20 LAB — CBC
HCT: 30.1 % — ABNORMAL LOW (ref 35.0–47.0)
Hemoglobin: 10.1 g/dL — ABNORMAL LOW (ref 12.0–16.0)
MCH: 28.3 pg (ref 26.0–34.0)
MCHC: 33.6 g/dL (ref 32.0–36.0)
MCV: 84 fL (ref 80.0–100.0)
PLATELETS: 171 10*3/uL (ref 150–440)
RBC: 3.58 MIL/uL — ABNORMAL LOW (ref 3.80–5.20)
RDW: 13.4 % (ref 11.5–14.5)
WBC: 7 10*3/uL (ref 3.6–11.0)

## 2016-06-20 LAB — COMPREHENSIVE METABOLIC PANEL
ALT: 11 U/L — AB (ref 14–54)
AST: 17 U/L (ref 15–41)
Albumin: 2.9 g/dL — ABNORMAL LOW (ref 3.5–5.0)
Alkaline Phosphatase: 76 U/L (ref 38–126)
Anion gap: 8 (ref 5–15)
BILIRUBIN TOTAL: 1.2 mg/dL (ref 0.3–1.2)
BUN: 7 mg/dL (ref 6–20)
CHLORIDE: 103 mmol/L (ref 101–111)
CO2: 24 mmol/L (ref 22–32)
CREATININE: 0.56 mg/dL (ref 0.44–1.00)
Calcium: 8.2 mg/dL — ABNORMAL LOW (ref 8.9–10.3)
GFR calc Af Amer: >60 mL/min (ref >60)
Glucose, Bld: 89 mg/dL (ref 65–99)
Potassium: 3.3 mmol/L — ABNORMAL LOW (ref 3.5–5.1)
Sodium: 135 mmol/L (ref 135–145)
TOTAL PROTEIN: 6.1 g/dL — AB (ref 6.5–8.1)

## 2016-06-20 LAB — GLUCOSE, CAPILLARY: GLUCOSE-CAPILLARY: 79 mg/dL (ref 65–99)

## 2016-06-20 MED ORDER — LACTATED RINGERS IV SOLN
INTRAVENOUS | Status: DC
  Administered 2016-06-20 (×3): via INTRAVENOUS

## 2016-06-20 MED ORDER — LACTATED RINGERS IV BOLUS (SEPSIS)
500.0000 mL | Freq: Once | INTRAVENOUS | Status: AC
  Administered 2016-06-20: 500 mL via INTRAVENOUS

## 2016-06-20 MED ORDER — ONDANSETRON HCL 4 MG/2ML IJ SOLN
4.0000 mg | INTRAMUSCULAR | Status: DC | PRN
Start: 2016-06-20 — End: 2016-06-20
  Administered 2016-06-20: 4 mg via INTRAVENOUS
  Filled 2016-06-20: qty 2

## 2016-06-20 MED ORDER — ACETAMINOPHEN 500 MG PO TABS
ORAL_TABLET | ORAL | Status: AC
  Administered 2016-06-20: 1000 mg
  Filled 2016-06-20: qty 2

## 2016-06-20 MED ORDER — ACETAMINOPHEN 500 MG PO TABS: 1000.0000 mg | ORAL_TABLET | Freq: Four times a day (QID) | ORAL | Status: DC | PRN

## 2016-06-20 MED ORDER — PANTOPRAZOLE SODIUM 40 MG IV SOLR
40.0000 mg | Freq: Two times a day (BID) | INTRAVENOUS | Status: DC
  Administered 2016-06-20: 40 mg via INTRAVENOUS
  Filled 2016-06-20: qty 40

## 2016-06-20 NOTE — Discharge Summary (Signed)
Physician Discharge Summary  Patient ID: Anna Flores MRN: [id number] DOB/AGE: 10/01/1990 25 y.o.  Admit date: 06/20/2016 Discharge date: 06/20/2016  Admission Diagnoses:  Discharge Diagnoses:  Principal Problem:   Noninfectious gastroenteritis and colitis Active Problems:   Nausea and vomiting in pregnancy   Discharged Condition: good  Hospital Course: Pt seen and examined for n/v/d with syncope.  Pregnant.  Treated with IVF and Zofran.  See H&P.  Improved over time.    Consults: None  Significant Diagnostic Studies: labs: UA w Ketones; and A NST procedure was performed with FHR monitoring and a normal baseline established, appropriate time of 20-40 minutes of evaluation, and accels >2 seen w 15x15 characteristics.  Results show a REACTIVE NST.   Treatments: IV hydration  Discharge Exam: Blood pressure (!) 104/57, pulse 94, temperature 98.2 F (36.8 C), temperature source Oral, resp. rate 18, last menstrual period 10/24/2015. see H&P  Disposition: 01-Home or Self Care     Medication List    TAKE these medications   DICLEGIS 10-10 MG Tbec Generic drug:  Doxylamine-Pyridoxine Take 1 tablet by mouth at bedtime.   ondansetron 8 MG tablet Commonly known as:  ZOFRAN Take 8 mg by mouth every 8 (eight) hours as needed for nausea or vomiting.   sertraline 50 MG tablet Commonly known as:  ZOLOFT Take 25 mg by mouth daily.   TYLENOL EXTRA STRENGTH PO Take 1 tablet by mouth as needed.      Follow-up Information    Anna Libraobert Paul Kyria Bumgardner, MD Follow up in 1 week(s).   Specialty:  Obstetrics and Gynecology Why:  as scheduled for prenatal visit Contact information: [address] [place] [id number] [phone]           Signed: Letitia LibraRobert Paul Ruthanna Flores 06/20/2016, 6:35 PM

## 2016-06-20 NOTE — Discharge Instructions (Signed)
Drink plenty of fluids; return back to hospital is symptoms persist or worsen.

## 2016-06-20 NOTE — Progress Notes (Signed)
Pt seen, still weak and dizzy.  + nausea, no diarrhea. P 100, BP 85/45 Shoulder from fall- sore to touch, no ecchymoses or hematoma or mass. Abd gravid mildly T to touch, FHT 140s. Extr no edema  A: Gastroenteritis with hypotension, episode of syncope at home P: Cont IVF.  Zofran.  Monitor for improvement in BP and sx's of hypotension. FWR.

## 2016-06-20 NOTE — H&P (Signed)
OB History & Physical   History of Present Illness:  Chief Complaint:   HPI:  Anna Flores is a 25 y.o. 13P2002 female with EDC=08/17/2016 at 422w5d dated by a 5wk5d ultrasound.  Her pregnancy has been complicated by hyperemesis and obesity., and anxiety/depression. She has had a net weight gain of 1# with the pregnancy She presents to L&D for evaluation of nausea and vomiting and diarrhea since last night. She passed out last night at 1130 PM when she was kneeling at the commode vomiting. She has seen blood in her vomitus. Tried taking Diclegis at 8 PM last night and Zofran 8 mgm at 2 Am-but she vomited up the medication. She used to take protonix and reglan when she had hyperemesis, but stopped taking those medications after 25 weeks when the nausea and vomiting resolved.  Has been feeling generalized abdominal pain and contractions every 6-8 minutes. Also has substernal discomfort.  She also complains of left shoulder pain and tenderness on the right occipital area...may have landed on her shoulder when she passed out last night  ROS : Constitutional: positive for hot and cold chills GU: no vaginal bleeding Prenatal care [facility]       Maternal Medical History:   Past Medical History:  Diagnosis Date  . Anxiety   . Depression   Hyperemesis  Past Surgical History:  Procedure Laterality Date  . TONSILLECTOMY      Allergies  Allergen Reactions  . Amoxicillin Hives  . Cherry Hives    Prior to Admission medications   Medication Sig Start Date End Date Taking? Authorizing Provider  DICLEGIS 10-10 MG TBEC Take 1 tablet by mouth at bedtime. 04/07/16  Yes Historical Provider, MD  ondansetron (ZOFRAN) 8 MG tablet Take 8 mg by mouth every 8 (eight) hours as needed for nausea or vomiting.   Yes Historical Provider, MD  Acetaminophen (TYLENOL EXTRA STRENGTH PO) Take 1 tablet by mouth as needed.    Historical Provider, MD  sertraline (ZOLOFT) 50 MG tablet Take 25  mg by mouth daily.     Historical Provider, MD          Social History: She  reports that she has never smoked. She has never used smokeless tobacco. She reports that she does not drink alcohol or use drugs.  Family History: family history is not on file. She was adopted.   Review of Systems: Negative x 10 systems reviewed except as noted in the HPI.      Physical Exam:  Vital Signs: BP 112/65 (BP Location: Right Arm)   Pulse (!) 103   Temp 99.5 F (37.5 C) (Oral)   Resp 18   LMP 10/24/2015 (LMP Unknown)  General: appears mildly distressed HEENT: normocephalic, tenderness on left occipital area Heart: regular rate & rhythm.  No murmurs Lungs: clear to auscultation bilaterally Abdomen: soft, gravid, generalized tenderness, bowel sounds: tinkling heard. Pelvic:   External: Normal external female genitalia  Cervix:   /   /     Extremities: non-tender, symmetric, no edema bilaterally.   Neurologic: Alert & oriented x 3.   Back: no CVAT, tenderness over left shoulder blade with a large "knot" noted there. No briusing seen   Baseline FHR: 140 baseline with accelerations to 150s to 160s, moderate variability Toco: no contractions noted, uterus seems small for gestational age  Assessment:  Anna Flores is a 25 y.o. 153P2002 female at 592w5d with nausea/ vomiting/ diarrhea. Blood in vomitus-possible mallory Weiss teat. S/p  syncopal episode last night with left shoulder trauma.   Plan:  1. Admit to Labor & Delivery for observation and IV hydration and antiemetics 2. CBC, CMP 3. Protonix 4. Ice to shoulder 5. NPO for now  Monnie Anspach  06/20/2016 11:18 AM

## 2016-06-20 NOTE — Discharge Summary (Signed)
Pt given discharge instructions. Encouraged to increase PO fluids and call if symptoms persist or get worse. Patient verbalized understanding. Pt transported by a wheelchair to ER lot along with spouse.

## 2016-06-20 NOTE — OB Triage Note (Signed)
Pt states she has had nausea/vomiting since 1900 last night. States that her son has a viral infection and he has had n/v the past 2-3 days. She states that she is light headed, weak, and has chills. States she has not taken her temperature before arrival to the hospital. Has taken zofran (around 0200 06/20/2016) and diclegis (around 2000 06/19/2016), but not able to hold it down. Drinking fluids but not able to tolerate well.

## 2016-06-24 ENCOUNTER — Encounter: Payer: Self-pay | Admitting: Dietician

## 2016-06-24 NOTE — Progress Notes (Unsigned)
Called pt-pt does not want to schedule any further follow up at this time-  Pt was recently hospitalized for vomiting and dehydration-doing better at present time and BG's are normal.  Advised pt to call us if she decides to return. Pt discharged

## 2016-07-15 ENCOUNTER — Encounter: Payer: Self-pay | Admitting: *Deleted

## 2016-07-15 ENCOUNTER — Observation Stay
Admission: EM | Admit: 2016-07-15 | Discharge: 2016-07-15 | Disposition: A | Discharge status: Home / Self Care | Payer: Medicaid Other | Attending: Certified Nurse Midwife | Admitting: Certified Nurse Midwife

## 2016-07-15 DIAGNOSIS — Z3A35 35 weeks gestation of pregnancy: Secondary | ICD-10-CM | POA: Insufficient documentation

## 2016-07-15 DIAGNOSIS — R102 Pelvic and perineal pain: Secondary | ICD-10-CM | POA: Diagnosis present

## 2016-07-15 DIAGNOSIS — O4703 False labor before 37 completed weeks of gestation, third trimester: Principal | ICD-10-CM | POA: Insufficient documentation

## 2016-07-15 LAB — URINALYSIS, COMPLETE (UACMP) WITH MICROSCOPIC
Bilirubin Urine: NEGATIVE
GLUCOSE, UA: NEGATIVE mg/dL
HGB URINE DIPSTICK: NEGATIVE
Ketones, ur: NEGATIVE mg/dL
NITRITE: NEGATIVE
PH: 6 (ref 5.0–8.0)
Protein, ur: NEGATIVE mg/dL
RBC / HPF: NONE SEEN RBC/hpf (ref 0–5)
SPECIFIC GRAVITY, URINE: 1.018 (ref 1.005–1.030)

## 2016-07-15 NOTE — Progress Notes (Signed)
Urine collected, clean catch and sent to the lab for UA.

## 2016-07-15 NOTE — Progress Notes (Signed)
Regular diet given, pt. Tolerated without emesis.

## 2016-07-15 NOTE — OB Triage Note (Signed)
Contractions started around 1700 on 12/10, over night contractions are getting stronger and more intense, pain 7/10, lower pelvic area radiating to lower back with vaginal and buttock pressure. Pt. Denies sudden gush of fluid, and denies vaginal bleeding. Last sexual encounter was a weeks ago.Positive for fetal movement.

## 2016-07-15 NOTE — Discharge Instructions (Signed)
Discharge instructions and labor precautions reviewed with patient, patient verbalized understand. Pt. Signed copy and copy given.

## 2016-07-15 NOTE — Final Progress Note (Signed)
Physician Final Progress Note  Patient ID: Anna Flores MRN: [id number] DOB/AGE: 07-Jul-1991 25 y.o.  Admit date: 07/15/2016 Admitting provider: Conard NovakStephen D Jackson, MD/ Gasper Lloydolleen L. Shadia Larose,CNM Discharge date: 07/15/2016   Admission Diagnoses: IUP at 35 weeks with contractions and pelvic pain  Discharge Diagnoses:  Same as above Not in labor Consults: None  Significant Findings/ Diagnostic Studies: 25 year [facility] P2002 [facility] who presents at 35.2 weeks with complaints of painful contractions since last night. Has been having pelvic pain/pressure for over a week. Contractions were irregular and inconsistent in frequency, duration, and strength. FHR was beautifully reactive with baseline 130 and accelerations to 160s to 180s, moderate variability. Cervix remained FT-1/50%/-3 (same exam as last week in office). A GBS was obtained Urinalysis was remarkable for +1 leukocytes,  but 0-5 WBC. A urine culture was ordered.Patient discharged home with labor precautions.  Procedures: none  Discharge Condition: stable  Disposition: 01-Home or Self Care  Diet: Regular diet  Discharge Activity: Activity as tolerated  [facility] tomorrow at 1510      Total time spent taking care of this patient:15 minutes  Signed: Geofrey Silliman 07/15/2016, 1:39 PM

## 2016-07-16 LAB — URINE CULTURE
Culture: NO GROWTH
Special Requests: NORMAL

## 2016-07-17 LAB — CULTURE, BETA STREP (GROUP B ONLY): Special Requests: NORMAL

## 2016-07-22 ENCOUNTER — Inpatient Hospital Stay
Admission: EM | Admit: 2016-07-22 | Discharge: 2016-07-22 | Disposition: A | Discharge status: Home / Self Care | Payer: Medicaid Other | Attending: Obstetrics and Gynecology | Admitting: Obstetrics and Gynecology

## 2016-07-22 DIAGNOSIS — O368130 Decreased fetal movements, third trimester, not applicable or unspecified: Secondary | ICD-10-CM | POA: Insufficient documentation

## 2016-07-22 DIAGNOSIS — Z3A36 36 weeks gestation of pregnancy: Secondary | ICD-10-CM | POA: Diagnosis not present

## 2016-07-22 NOTE — OB Triage Provider Note (Signed)
Patient came in for observation for labor evaluation and decreased fetal movement. Patient reports contractions every three to five minutes. Patient reports [ain 7 out of 10.  Patient denies  leaking of fluid, vaginal bleeding and spotting. Vital signs stable and patient afebrile. FHR baseline 135 with moderate variability with accelerations 15 x 15 and no decelerations. Will continue to monitor.

## 2016-07-22 NOTE — Discharge Summary (Signed)
Patient discharged with instructions on fetal kick count, labor precautions, follow up appointment, and when to seek medical attention. Patient ambulatory at discharge with steady gait and no complaints.

## 2016-07-27 ENCOUNTER — Inpatient Hospital Stay
Admission: EM | Admit: 2016-07-27 | Discharge: 2016-07-28 | Disposition: A | Discharge status: Home / Self Care | Payer: Medicaid Other | Attending: Obstetrics & Gynecology | Admitting: Obstetrics & Gynecology

## 2016-07-27 DIAGNOSIS — Z3A37 37 weeks gestation of pregnancy: Secondary | ICD-10-CM | POA: Insufficient documentation

## 2016-07-27 NOTE — OB Triage Note (Signed)
Patient came in for observation for labor evaluation. Patient reports contractions every three to five minutes. Patient denies leaking of fluid, vaginal bleeding and spotting. Vital signs stable and patient afebrile. FHR baseline 135 with moderate variability with accelerations 15 x 15 and no decelerations. Family at bedside. Will continue to monitor.

## 2016-07-28 DIAGNOSIS — Z3A37 37 weeks gestation of pregnancy: Secondary | ICD-10-CM | POA: Diagnosis not present

## 2016-07-28 NOTE — Discharge Summary (Signed)
Patient discharged with instructions on follow up appointment, pain management, labor precautions, and when to seek medical attention. Patient ambulatory at discharged with steady gait. Patient discharged with husband.

## 2016-08-01 NOTE — Discharge Summary (Signed)
Late Entry: Pt was evaluated by RN on 12/23 for contractions. Not in labor at time of evaluation. Pt was triaged over the phone, I did not see her.

## 2016-08-05 HISTORY — PX: DIAGNOSTIC LAPAROSCOPY: SUR761

## 2016-08-05 NOTE — L&D Delivery Note (Addendum)
Estimated Date of Delivery: 08/17/16 Patient's last menstrual period was 10/24/2015  EGA: 2967w2d  Delivery Note At 9:21 PM a viable female was delivered via Vaginal, Spontaneous Delivery (Presentation: OA; LOA).  APGAR: 8, 9; weight  3620g.   Placenta status: spontaneous, intact.  Cord:  with the following complications: none.  Cord pH: none  Called to see patient.  Mom pushed to deliver a viable female infant.  The head followed by shoulders, which delivered without difficulty, and the rest of the body.  No nuchal cord noted.  Baby to mom's chest.  Cord clamped and cut after 4 min delay.  Cord blood obtained.  Placenta delivered spontaneously, intact, with a 3-vessel cord.  Perineum intact.  All counts correct.  Hemostasis obtained with IV pitocin and fundal massage.    Anesthesia:  epidural Episiotomy:  none Lacerations:  intact Suture Repair: NA Est. Blood Loss (mL): 100  Mom to postpartum.  Baby to Couplet care / Skin to Skin.  Tresea MallGLEDHILL,Mannie Wineland, CNM 08/12/2016, 9:43 PM

## 2016-08-11 ENCOUNTER — Inpatient Hospital Stay
Admission: RE | Admit: 2016-08-11 | Discharge: 2016-08-14 | DRG: 775 | Disposition: A | Discharge status: Home / Self Care | Payer: Medicaid Other | Attending: Advanced Practice Midwife | Admitting: Advanced Practice Midwife

## 2016-08-11 ENCOUNTER — Encounter: Payer: Self-pay | Admitting: *Deleted

## 2016-08-11 DIAGNOSIS — Z3493 Encounter for supervision of normal pregnancy, unspecified, third trimester: Secondary | ICD-10-CM | POA: Diagnosis present

## 2016-08-11 DIAGNOSIS — Z3A39 39 weeks gestation of pregnancy: Secondary | ICD-10-CM | POA: Diagnosis not present

## 2016-08-11 DIAGNOSIS — O99214 Obesity complicating childbirth: Secondary | ICD-10-CM | POA: Diagnosis present

## 2016-08-11 DIAGNOSIS — Z6831 Body mass index (BMI) 31.0-31.9, adult: Secondary | ICD-10-CM

## 2016-08-11 DIAGNOSIS — E669 Obesity, unspecified: Secondary | ICD-10-CM | POA: Diagnosis present

## 2016-08-11 LAB — CBC
HEMATOCRIT: 30.6 % — AB (ref 35.0–47.0)
HEMOGLOBIN: 10.6 g/dL — AB (ref 12.0–16.0)
MCH: 27.3 pg (ref 26.0–34.0)
MCHC: 34.7 g/dL (ref 32.0–36.0)
MCV: 78.7 fL — ABNORMAL LOW (ref 80.0–100.0)
Platelets: 224 10*3/uL (ref 150–440)
RBC: 3.89 MIL/uL (ref 3.80–5.20)
RDW: 15.5 % — ABNORMAL HIGH (ref 11.5–14.5)
WBC: 8.2 10*3/uL (ref 3.6–11.0)

## 2016-08-11 LAB — CHLAMYDIA/NGC RT PCR (ARMC ONLY)
CHLAMYDIA TR: NOT DETECTED
N gonorrhoeae: NOT DETECTED

## 2016-08-11 LAB — TYPE AND SCREEN
ABO/RH(D): O POS
Antibody Screen: NEGATIVE

## 2016-08-11 MED ORDER — LIDOCAINE HCL (PF) 1 % IJ SOLN
INTRAMUSCULAR | Status: AC
  Filled 2016-08-11: qty 30

## 2016-08-11 MED ORDER — AMMONIA AROMATIC IN INHA
RESPIRATORY_TRACT | Status: AC
  Filled 2016-08-11: qty 10

## 2016-08-11 MED ORDER — OXYTOCIN 40 UNITS IN LACTATED RINGERS INFUSION - SIMPLE MED
1.0000 m[IU]/min | INTRAVENOUS | Status: DC
  Administered 2016-08-11 – 2016-08-12 (×2): 1 m[IU]/min via INTRAVENOUS

## 2016-08-11 MED ORDER — MISOPROSTOL 200 MCG PO TABS
ORAL_TABLET | ORAL | Status: AC
  Filled 2016-08-11: qty 4

## 2016-08-11 MED ORDER — TERBUTALINE SULFATE 1 MG/ML IJ SOLN: 0.2500 mg | Freq: Once | INTRAMUSCULAR | Status: DC | PRN

## 2016-08-11 MED ORDER — BUTORPHANOL TARTRATE 1 MG/ML IJ SOLN
1.0000 mg | INTRAMUSCULAR | Status: DC | PRN
  Administered 2016-08-12: 1 mg via INTRAVENOUS
  Filled 2016-08-11: qty 1

## 2016-08-11 MED ORDER — OXYTOCIN BOLUS FROM INFUSION
500.0000 mL | Freq: Once | INTRAVENOUS | Status: AC
  Administered 2016-08-12: 500 mL via INTRAVENOUS

## 2016-08-11 MED ORDER — OXYTOCIN 10 UNIT/ML IJ SOLN
INTRAMUSCULAR | Status: AC
  Filled 2016-08-11: qty 2

## 2016-08-11 MED ORDER — OXYTOCIN 40 UNITS IN LACTATED RINGERS INFUSION - SIMPLE MED
INTRAVENOUS | Status: AC
  Administered 2016-08-11: 1 m[IU]/min via INTRAVENOUS
  Filled 2016-08-11: qty 1000

## 2016-08-11 MED ORDER — ACETAMINOPHEN 325 MG PO TABS
650.0000 mg | ORAL_TABLET | ORAL | Status: DC | PRN
  Administered 2016-08-12: 650 mg via ORAL
  Filled 2016-08-11: qty 2

## 2016-08-11 MED ORDER — LACTATED RINGERS IV SOLN: 500.0000 mL | INTRAVENOUS | Status: DC | PRN

## 2016-08-11 MED ORDER — LACTATED RINGERS IV SOLN
INTRAVENOUS | Status: DC
  Administered 2016-08-11 – 2016-08-12 (×3): via INTRAVENOUS

## 2016-08-11 MED ORDER — OXYTOCIN 40 UNITS IN LACTATED RINGERS INFUSION - SIMPLE MED
2.5000 [IU]/h | INTRAVENOUS | Status: DC
  Administered 2016-08-12: 2.5 [IU]/h via INTRAVENOUS

## 2016-08-11 MED ORDER — ONDANSETRON HCL 4 MG/2ML IJ SOLN
4.0000 mg | Freq: Four times a day (QID) | INTRAMUSCULAR | Status: DC | PRN
  Administered 2016-08-12: 4 mg via INTRAVENOUS
  Filled 2016-08-11: qty 2

## 2016-08-11 MED ORDER — LIDOCAINE HCL (PF) 1 % IJ SOLN
30.0000 mL | INTRAMUSCULAR | Status: AC | PRN
  Administered 2016-08-12: 1.3 mL via SUBCUTANEOUS

## 2016-08-11 MED ORDER — SOD CITRATE-CITRIC ACID 500-334 MG/5ML PO SOLN: 30.0000 mL | ORAL | Status: DC | PRN

## 2016-08-11 NOTE — H&P (Signed)
Date of Initial paper H&P: 08/10/15  History reviewed, patient examined, no change in status, stable to proceed with elective IOL

## 2016-08-12 ENCOUNTER — Inpatient Hospital Stay: Payer: Medicaid Other | Admitting: Anesthesiology

## 2016-08-12 MED ORDER — PHENYLEPHRINE 40 MCG/ML (10ML) SYRINGE FOR IV PUSH (FOR BLOOD PRESSURE SUPPORT)
80.0000 ug | PREFILLED_SYRINGE | INTRAVENOUS | Status: DC | PRN
  Filled 2016-08-12: qty 5

## 2016-08-12 MED ORDER — LIDOCAINE-EPINEPHRINE (PF) 1.5 %-1:200000 IJ SOLN
INTRAMUSCULAR | Status: DC | PRN
  Administered 2016-08-12: 3 mL via EPIDURAL

## 2016-08-12 MED ORDER — ROPIVACAINE HCL 2 MG/ML IJ SOLN
10.0000 mL/h | INTRAMUSCULAR | Status: DC
  Filled 2016-08-12 (×2): qty 200

## 2016-08-12 MED ORDER — EPHEDRINE 5 MG/ML INJ
10.0000 mg | INTRAVENOUS | Status: DC | PRN
Start: 2016-08-12 — End: 2016-08-13
  Filled 2016-08-12: qty 2

## 2016-08-12 MED ORDER — FENTANYL 2.5 MCG/ML W/ROPIVACAINE 0.2% IN NS 100 ML EPIDURAL INFUSION (ARMC-ANES)
10.0000 mL/h | EPIDURAL | Status: DC
  Administered 2016-08-12: 10 mL/h via EPIDURAL
  Filled 2016-08-12: qty 100

## 2016-08-12 MED ORDER — LACTATED RINGERS IV SOLN
500.0000 mL | Freq: Once | INTRAVENOUS | Status: AC
  Administered 2016-08-12: 500 mL via INTRAVENOUS

## 2016-08-12 MED ORDER — DIPHENHYDRAMINE HCL 50 MG/ML IJ SOLN: 12.5000 mg | INTRAMUSCULAR | Status: DC | PRN

## 2016-08-12 MED ORDER — EPHEDRINE 5 MG/ML INJ
10.0000 mg | INTRAVENOUS | Status: DC | PRN
  Filled 2016-08-12: qty 2

## 2016-08-12 NOTE — Anesthesia Preprocedure Evaluation (Signed)
Anesthesia Evaluation  Patient identified by MRN, date of birth, ID band Patient awake    Reviewed: Allergy & Precautions, Patient's Chart, lab work & pertinent test results  History of Anesthesia Complications Negative for: history of anesthetic complications  Airway Mallampati: II       Dental   Pulmonary neg pulmonary ROS,           Cardiovascular negative cardio ROS       Neuro/Psych negative neurological ROS     GI/Hepatic negative GI ROS, Neg liver ROS,   Endo/Other  negative endocrine ROS  Renal/GU negative Renal ROS     Musculoskeletal   Abdominal   Peds  Hematology   Anesthesia Other Findings   Reproductive/Obstetrics (+) Pregnancy                             Anesthesia Physical Anesthesia Plan  ASA: II  Anesthesia Plan: Epidural   Post-op Pain Management:    Induction:   Airway Management Planned:   Additional Equipment:   Intra-op Plan:   Post-operative Plan:   Informed Consent: I have reviewed the patients History and Physical, chart, labs and discussed the procedure including the risks, benefits and alternatives for the proposed anesthesia with the patient or authorized representative who has indicated his/her understanding and acceptance.     Plan Discussed with:   Anesthesia Plan Comments:         Anesthesia Quick Evaluation

## 2016-08-12 NOTE — Discharge Summary (Signed)
OB Discharge Summary     Patient Name: Anna Flores DOB: July 01, 1991 MRN: [id number]  Date of admission: 08/11/2016 Delivering MD: Obie DredgeGLEDHILL,[name], CNM  Date of Delivery: 08/12/2016  Date of discharge: 08/14/2016   Admitting diagnosis: induction Intrauterine pregnancy: 4968w2d     Secondary diagnosis: obesity BMI 31     Discharge diagnosis: Term Pregnancy Delivered                                                                                                Post partum procedures:none  Augmentation: AROM, Pitocin and Cytotec  Complications: None  Hospital course:  Induction of Labor With Vaginal Delivery   26 y.o. yo [id number] at 4868w2d was admitted to the hospital 08/11/2016 for induction of labor.   Indication for induction: pelvic and pubic symphysis pain.   Patient had an uncomplicated labor course as follows: Membrane Rupture Time/Date: 3:00 PM ,08/12/2016   Patient had delivery of a Viable female infant 08/12/2016.  Details of delivery can be found in separate delivery note.   Patient had a routine postpartum course.  Patient is discharged home 08/14/2016 .   Physical exam  Vitals:   08/13/16 1608 08/13/16 1908 08/14/16 0256 08/14/16 0754  BP: 112/79 115/68 (!) 106/54 107/67  Pulse: 73 80 63 67  Resp: 20 18  20   Temp: 97.8 F (36.6 C) 98 F (36.7 C) 97.7 F (36.5 C) 97.6 F (36.4 C)  TempSrc: Oral Oral Oral Oral  SpO2:  100% 99%   Weight:      Height:       General: alert, cooperative and no distress Lochia: appropriate Uterine Fundus: firm Incision: N/A DVT Evaluation: No evidence of DVT seen on physical exam.  Labs: Lab Results  Component Value Date   WBC 6.3 08/13/2016   HGB 8.9 (L) 08/13/2016   HCT 26.7 (L) 08/13/2016   MCV 79.0 (L) 08/13/2016   PLT 181 08/13/2016   CMP Latest Ref Rng & Units 06/20/2016  Glucose 65 - 99 mg/dL 89  BUN 6 - 20 mg/dL 7  Creatinine 1.470.44 - 8.291.00 mg/dL 5.620.56  Sodium 130135 - 865145 mmol/L 135  Potassium 3.5 - 5.1 mmol/L 3.3(L)  Chloride 101  - 111 mmol/L 103  CO2 22 - 32 mmol/L 24  Calcium 8.9 - 10.3 mg/dL 8.2(L)  Total Protein 6.5 - 8.1 g/dL 6.1(L)  Total Bilirubin 0.3 - 1.2 mg/dL 1.2  Alkaline Phos 38 - 126 U/L 76  AST 15 - 41 U/L 17  ALT 14 - 54 U/L 11(L)    Discharge instruction: per After Visit Summary.  Medications:  Allergies as of 08/14/2016      Reactions   Amoxicillin Hives   Cherry Hives      Medication List    STOP taking these medications   DICLEGIS 10-10 MG Tbec Generic drug:  Doxylamine-Pyridoxine   ondansetron 8 MG tablet Commonly known as:  ZOFRAN     TAKE these medications   HYDROcodone-acetaminophen 5-325 MG tablet Commonly known as:  NORCO Take 0.5 tablets by mouth every 6 (six) hours as needed for moderate pain.   ibuprofen  600 MG tablet Commonly known as:  ADVIL,MOTRIN Take 1 tablet (600 mg total) by mouth every 6 (six) hours.   prenatal multivitamin Tabs tablet Take 1 tablet by mouth daily at 12 noon. Start taking on:  08/15/2016   TYLENOL EXTRA STRENGTH PO Take 1 tablet by mouth as needed.       Diet: routine diet  Activity: Advance as tolerated. Pelvic rest for 6 weeks.   Outpatient follow up: Follow-up Information    [name],[name], CNM. Schedule an appointment as soon as possible for a visit in 6 week(s).   Specialty:  Obstetrics Why:  postpartum follow up visit and IUD insertion (order [place]) Contact information: [address] [place] [address] [phone]            Postpartum contraception: IUD Liletta Rhogam Given postpartum: NA Rubella vaccine given postpartum: Rubella Immune Varicella vaccine given postpartum: Varicella Immune TDaP given antepartum or postpartum: postpartum Flu vaccine: declined  Newborn Data: Live born female: Decklin Birth Weight: 3620g APGAR: 8, 9   Baby Feeding: Breast  Disposition:home with mother  SIGNED: Conard Novak, MD 08/14/2016 11:01 AM

## 2016-08-12 NOTE — Progress Notes (Signed)
  Labor Progress Note   26 y.o. [id number] @ [redacted]w[redacted]d , admitted for  Pregnancy, Labor Management. IOL for hx of precipitous deliveries  Subjective:  Pt is coping well with regular contractions. Discussion re AROM and progression of labor. Pt has questions about IV pain med vs epidural. She is informed about nitrous for pain relief.  Objective:  BP 115/74 (BP Location: Left Arm)   Pulse 74   Temp 97.6 F (36.4 C) (Oral)   Resp 20   Ht 5\' 4"  (1.626 m)   Wt 196 lb (88.9 kg)   LMP 10/24/2015 (LMP Unknown)   BMI 33.64 kg/m  Abd: mild Extr: trace to 1+ bilateral pedal edema SVE: CERVIX: 4 cm dilated, 80 effaced, -1 station  EFM: FHR: 120 bpm, variability: moderate,  accelerations:  Present,  decelerations:  Absent Toco: Frequency: Every 2 minutes Labs: I have reviewed the patient's lab results.   Assessment & Plan:  [id number] @ 2665w2d, admitted for  Pregnancy and Labor/Delivery Management  1. Pain management: none and IV analgesia. 2. FWB: FHT category I.  3. ID: GBS negative 4. Labor management: AROM clear fluid All discussed with patient, see orders   Jeyda Siebel, CNM

## 2016-08-12 NOTE — Anesthesia Procedure Notes (Signed)
Epidural Patient location during procedure: OB Start time: 08/12/2016 5:10 PM End time: 08/12/2016 5:37 PM  Staffing Performed: anesthesiologist   Preanesthetic Checklist Completed: patient identified, site marked, surgical consent, pre-op evaluation, timeout performed, IV checked, risks and benefits discussed and monitors and equipment checked  Epidural Patient position: sitting Prep: Betadine Patient monitoring: heart rate, continuous pulse ox and blood pressure Approach: midline Location: L4-L5 Injection technique: LOR saline  Needle:  Needle type: Tuohy  Needle gauge: 17 G Needle length: 9 cm and 9 Needle insertion depth: 7 cm Catheter type: closed end flexible Catheter size: 19 Gauge Catheter at skin depth: 10 cm Test dose: negative and 1.5% lidocaine with Epi 1:200 K  Assessment Events: blood not aspirated, injection not painful, no injection resistance, negative IV test and no paresthesia  Additional Notes   Patient tolerated the insertion well without complications.Reason for block:procedure for pain

## 2016-08-13 LAB — CBC
HCT: 26.7 % — ABNORMAL LOW (ref 35.0–47.0)
Hemoglobin: 8.9 g/dL — ABNORMAL LOW (ref 12.0–16.0)
MCH: 26.5 pg (ref 26.0–34.0)
MCHC: 33.5 g/dL (ref 32.0–36.0)
MCV: 79 fL — ABNORMAL LOW (ref 80.0–100.0)
Platelets: 181 10*3/uL (ref 150–440)
RBC: 3.37 MIL/uL — ABNORMAL LOW (ref 3.80–5.20)
RDW: 15.6 % — AB (ref 11.5–14.5)
WBC: 6.3 10*3/uL (ref 3.6–11.0)

## 2016-08-13 LAB — RPR: RPR Ser Ql: NONREACTIVE

## 2016-08-13 MED ORDER — DIPHENHYDRAMINE HCL 25 MG PO CAPS: 25.0000 mg | ORAL_CAPSULE | Freq: Four times a day (QID) | ORAL | Status: DC | PRN

## 2016-08-13 MED ORDER — PRENATAL MULTIVITAMIN CH
1.0000 | ORAL_TABLET | Freq: Every day | ORAL | Status: DC
  Administered 2016-08-13 – 2016-08-14 (×2): 1 via ORAL
  Filled 2016-08-13 (×2): qty 1

## 2016-08-13 MED ORDER — ONDANSETRON HCL 4 MG PO TABS: 4.0000 mg | ORAL_TABLET | ORAL | Status: DC | PRN

## 2016-08-13 MED ORDER — IBUPROFEN 600 MG PO TABS
600.0000 mg | ORAL_TABLET | Freq: Four times a day (QID) | ORAL | Status: DC
  Administered 2016-08-13 – 2016-08-14 (×4): 600 mg via ORAL
  Filled 2016-08-13 (×4): qty 1

## 2016-08-13 MED ORDER — WITCH HAZEL-GLYCERIN EX PADS: 1.0000 "application " | MEDICATED_PAD | CUTANEOUS | Status: DC | PRN

## 2016-08-13 MED ORDER — ACETAMINOPHEN 325 MG PO TABS: 650.0000 mg | ORAL_TABLET | ORAL | Status: DC | PRN

## 2016-08-13 MED ORDER — TETANUS-DIPHTH-ACELL PERTUSSIS 5-2.5-18.5 LF-MCG/0.5 IM SUSP
0.5000 mL | Freq: Once | INTRAMUSCULAR | Status: AC
  Administered 2016-08-14: 0.5 mL via INTRAMUSCULAR
  Filled 2016-08-13: qty 0.5

## 2016-08-13 MED ORDER — BENZOCAINE-MENTHOL 20-0.5 % EX AERO
1.0000 "application " | INHALATION_SPRAY | CUTANEOUS | Status: DC | PRN
  Administered 2016-08-13: 1 via TOPICAL
  Filled 2016-08-13: qty 56

## 2016-08-13 MED ORDER — DIBUCAINE 1 % RE OINT: 1.0000 "application " | TOPICAL_OINTMENT | RECTAL | Status: DC | PRN

## 2016-08-13 MED ORDER — OXYCODONE HCL 5 MG PO TABS
5.0000 mg | ORAL_TABLET | ORAL | Status: DC | PRN
  Administered 2016-08-13 – 2016-08-14 (×8): 5 mg via ORAL
  Filled 2016-08-13 (×8): qty 1

## 2016-08-13 MED ORDER — COCONUT OIL OIL: 1.0000 "application " | TOPICAL_OIL | Status: DC | PRN

## 2016-08-13 MED ORDER — FERROUS FUMARATE 324 (106 FE) MG PO TABS
1.0000 | ORAL_TABLET | Freq: Every day | ORAL | Status: DC
  Administered 2016-08-13 – 2016-08-14 (×2): 106 mg via ORAL
  Filled 2016-08-13 (×2): qty 1

## 2016-08-13 MED ORDER — ONDANSETRON HCL 4 MG/2ML IJ SOLN: 4.0000 mg | INTRAMUSCULAR | Status: DC | PRN

## 2016-08-13 MED ORDER — SENNOSIDES-DOCUSATE SODIUM 8.6-50 MG PO TABS
2.0000 | ORAL_TABLET | ORAL | Status: DC
  Administered 2016-08-13: 2 via ORAL
  Filled 2016-08-13: qty 2

## 2016-08-13 MED ORDER — IBUPROFEN 600 MG PO TABS
ORAL_TABLET | ORAL | Status: AC
  Administered 2016-08-13: 600 mg
  Filled 2016-08-13: qty 1

## 2016-08-13 MED ORDER — SIMETHICONE 80 MG PO CHEW: 80.0000 mg | CHEWABLE_TABLET | ORAL | Status: DC | PRN

## 2016-08-13 MED ORDER — OXYCODONE HCL 5 MG PO TABS: 10.0000 mg | ORAL_TABLET | ORAL | Status: DC | PRN

## 2016-08-13 NOTE — Progress Notes (Signed)
Admit Date: 08/11/2016 Today's Date: 08/13/2016  Post Partum Day 1  Subjective:  no complaints, up ad lib, voiding and tolerating PO  Objective: Temp:  [97.4 F (36.3 C)-99.1 F (37.3 C)] 98.7 F (37.1 C) (01/09 0747) Pulse Rate:  [70-99] 80 (01/09 0747) Resp:  [17-20] 20 (01/09 0747) BP: (93-131)/(50-76) 93/50 (01/09 0747) SpO2:  [98 %-100 %] 100 % (01/09 0506)  Physical Exam:  General: alert, cooperative and no distress Lochia: appropriate Uterine Fundus: firm Incision: none DVT Evaluation: No evidence of DVT seen on physical exam.   Recent Labs  08/11/16 1953 08/13/16 0555  HGB 10.6* 8.9*  HCT 30.6* 26.7*    Assessment/Plan: Plan for discharge tomorrow, Breastfeeding and Infant doing well   LOS: 2 days   Letitia Libraobert Paul Renji Berwick [facility] 08/13/2016, 8:37 AM

## 2016-08-14 MED ORDER — HYDROCODONE-ACETAMINOPHEN 5-325 MG PO TABS: 0.5000 | ORAL_TABLET | Freq: Four times a day (QID) | ORAL | 0 refills | Status: DC | PRN

## 2016-08-14 MED ORDER — IBUPROFEN 600 MG PO TABS: 600.0000 mg | ORAL_TABLET | Freq: Four times a day (QID) | ORAL | 0 refills | Status: DC

## 2016-08-14 MED ORDER — IBUPROFEN 600 MG PO TABS
600.0000 mg | ORAL_TABLET | Freq: Four times a day (QID) | ORAL | Status: DC
  Administered 2016-08-14: 600 mg via ORAL
  Filled 2016-08-14: qty 1

## 2016-08-14 MED ORDER — PRENATAL MULTIVITAMIN CH: 1.0000 | ORAL_TABLET | Freq: Every day | ORAL | 0 refills | Status: DC

## 2016-08-14 NOTE — Anesthesia Postprocedure Evaluation (Signed)
Anesthesia Post Note  Patient: Anna Flores  Procedure(s) Performed: * No procedures listed *  Patient location during evaluation: Mother Baby Anesthesia Type: Epidural Level of consciousness: awake and alert Pain management: pain level controlled Vital Signs Assessment: post-procedure vital signs reviewed and stable Respiratory status: spontaneous breathing, nonlabored ventilation and respiratory function stable Cardiovascular status: stable Postop Assessment: no headache, no backache and epidural receding Anesthetic complications: no     Last Vitals:  Vitals:   08/13/16 1908 08/14/16 0256  BP: 115/68 (!) 106/54  Pulse: 80 63  Resp: 18   Temp: 36.7 C 36.5 C    Last Pain:  Vitals:   08/14/16 0256  TempSrc: Oral  PainSc:                  Anna Flores,  Anna Flores

## 2016-08-14 NOTE — Progress Notes (Signed)
Patient discharged home with infant and family. Discharge instructions, prescriptions and follow up appointment given to and reviewed with patient and family. Patient verbalized understanding. Escorted out via wheelchair by auxiliary.  

## 2016-10-02 ENCOUNTER — Ambulatory Visit: Payer: Medicaid Other | Admitting: Advanced Practice Midwife

## 2016-10-02 DIAGNOSIS — Z9289 Personal history of other medical treatment: Secondary | ICD-10-CM

## 2016-10-02 HISTORY — DX: Personal history of other medical treatment: Z92.89

## 2016-10-02 LAB — HM PAP SMEAR: HM Pap smear: NEGATIVE

## 2016-11-03 ENCOUNTER — Encounter: Payer: Self-pay | Admitting: Emergency Medicine

## 2016-11-03 ENCOUNTER — Emergency Department: Payer: Medicaid Other

## 2016-11-03 DIAGNOSIS — R079 Chest pain, unspecified: Secondary | ICD-10-CM | POA: Diagnosis present

## 2016-11-03 DIAGNOSIS — Z79899 Other long term (current) drug therapy: Secondary | ICD-10-CM | POA: Diagnosis not present

## 2016-11-03 DIAGNOSIS — N61 Mastitis without abscess: Secondary | ICD-10-CM | POA: Insufficient documentation

## 2016-11-03 LAB — BASIC METABOLIC PANEL
ANION GAP: 8 (ref 5–15)
BUN: 13 mg/dL (ref 6–20)
CHLORIDE: 102 mmol/L (ref 101–111)
CO2: 28 mmol/L (ref 22–32)
Calcium: 9.9 mg/dL (ref 8.9–10.3)
Creatinine, Ser: 0.88 mg/dL (ref 0.44–1.00)
GFR calc non Af Amer: >60 mL/min (ref >60)
Glucose, Bld: 90 mg/dL (ref 65–99)
POTASSIUM: 3.8 mmol/L (ref 3.5–5.1)
Sodium: 138 mmol/L (ref 135–145)

## 2016-11-03 LAB — CBC
HEMATOCRIT: 38.1 % (ref 35.0–47.0)
Hemoglobin: 12.6 g/dL (ref 12.0–16.0)
MCH: 27.7 pg (ref 26.0–34.0)
MCHC: 33 g/dL (ref 32.0–36.0)
MCV: 84 fL (ref 80.0–100.0)
Platelets: 275 10*3/uL (ref 150–440)
RBC: 4.54 MIL/uL (ref 3.80–5.20)
RDW: 17 % — ABNORMAL HIGH (ref 11.5–14.5)
WBC: 11.9 10*3/uL — AB (ref 3.6–11.0)

## 2016-11-03 LAB — TROPONIN I: Troponin I: <0.03 ng/mL (ref <0.03)

## 2016-11-03 NOTE — ED Triage Notes (Signed)
Patient with complaint of left breast pain that stared three weeks ago. Patient states that the pain has become worse tonight and has started radiating to her left arm and into her left jaw. Patient states that she has pain to left side of her chest radiating to her back when taking a deep breath. Patient states that she is breast feeding. Patient with no redness to left breast.

## 2016-11-04 ENCOUNTER — Emergency Department
Admission: EM | Admit: 2016-11-04 | Discharge: 2016-11-04 | Disposition: A | Discharge status: Home / Self Care | Payer: Medicaid Other | Attending: Emergency Medicine | Admitting: Emergency Medicine

## 2016-11-04 DIAGNOSIS — N61 Mastitis without abscess: Secondary | ICD-10-CM

## 2016-11-04 LAB — FIBRIN DERIVATIVES D-DIMER (ARMC ONLY): FIBRIN DERIVATIVES D-DIMER (ARMC): 165.16 (ref 0.00–499.00)

## 2016-11-04 MED ORDER — IBUPROFEN 600 MG PO TABS
600.0000 mg | ORAL_TABLET | Freq: Once | ORAL | Status: AC
  Administered 2016-11-04: 600 mg via ORAL
  Filled 2016-11-04: qty 1

## 2016-11-04 MED ORDER — CLINDAMYCIN HCL 150 MG PO CAPS
300.0000 mg | ORAL_CAPSULE | Freq: Once | ORAL | Status: AC
  Administered 2016-11-04: 300 mg via ORAL
  Filled 2016-11-04: qty 2

## 2016-11-04 MED ORDER — IBUPROFEN 800 MG PO TABS: 800.0000 mg | ORAL_TABLET | Freq: Three times a day (TID) | ORAL | 0 refills | Status: DC | PRN

## 2016-11-04 MED ORDER — CLINDAMYCIN HCL 300 MG PO CAPS: 300.0000 mg | ORAL_CAPSULE | Freq: Three times a day (TID) | ORAL | 0 refills | Status: AC

## 2016-11-04 NOTE — ED Notes (Signed)
Pt refused IV access. RN explained if lab testing returns positive an IV would be recommended for further testing and pt continued to deny and request butterfly blood draw.

## 2016-11-04 NOTE — Discharge Instructions (Signed)
Please follow back up with your OB/GYN for further evaluation

## 2016-11-04 NOTE — ED Provider Notes (Signed)
[facility] Emergency Department Provider Note   ____________________________________________   First MD Initiated Contact with Patient 11/04/16 0159     (approximate)  I have reviewed the triage vital signs and the nursing notes.   HISTORY  Chief Complaint Chest Pain and Shortness of Breath    HPI Anna Flores is a 26 y.o. female who comes into the hospital today with chest pain. She reports it started in her left breast 3-4 weeks ago. She is breast-feeding and saw her OB/GYN. She was concerned about mastitis but the OB told her that it was not. Today the pain is gotten worse it is radiating up into her chest, neck, down to her arm into her armpit. The patient reports that her breasts tender to touch. She reports that she has been nursing and emptying on the left side without any plugged ducts or feelings of lumps. She denies any bleeding. She's been taking Tylenol for pain but has not helped. The patient rates pain a 9 on a 10 in intensity. She reports that when she initially starts nursing and when she lets down she has worsening shooting pains. She reports that she's been feeling sick since she arrived. She's not had any fevers but she has a headache and her neck feels a little stiff after being in the hospital for last few hours. The patient is here today for evaluation.   Past Medical History:  Diagnosis Date  . Anxiety   . Depression     Patient Active Problem List   Diagnosis Date Noted  . Postpartum care following vaginal delivery 08/12/2016  . Labor and delivery indication for care or intervention 08/11/2016  . Indication for care in labor or delivery 07/15/2016  . Nausea and vomiting in pregnancy 06/20/2016  . Noninfectious gastroenteritis and colitis 06/20/2016  . Amniotic fluid leaking 05/25/2016  . Preterm contractions 05/01/2016  . Vaginal discharge during pregnancy in second trimester 04/07/2016  . Hyperemesis gravidarum before end of [redacted]  week gestation, dehydration 02/09/2016  . Hyperemesis arising during pregnancy 01/10/2016    Past Surgical History:  Procedure Laterality Date  . TONSILLECTOMY      Prior to Admission medications   Medication Sig Start Date End Date Taking? Authorizing Provider  Acetaminophen (TYLENOL EXTRA STRENGTH PO) Take 1 tablet by mouth as needed.    Historical Provider, MD  clindamycin (CLEOCIN) 300 MG capsule Take 1 capsule (300 mg total) by mouth 3 (three) times daily. 11/04/16 11/14/16  Rebecka Apley, MD  HYDROcodone-acetaminophen (NORCO) 5-325 MG tablet Take 0.5 tablets by mouth every 6 (six) hours as needed for moderate pain. 08/14/16   Conard Novak, MD  ibuprofen (ADVIL,MOTRIN) 600 MG tablet Take 1 tablet (600 mg total) by mouth every 6 (six) hours. 08/14/16   Conard Novak, MD  ibuprofen (ADVIL,MOTRIN) 800 MG tablet Take 1 tablet (800 mg total) by mouth every 8 (eight) hours as needed. 11/04/16   Rebecka Apley, MD  Prenatal Vit-Fe Fumarate-FA (PRENATAL MULTIVITAMIN) TABS tablet Take 1 tablet by mouth daily at 12 noon. 08/15/16   Conard Novak, MD    Allergies Amoxicillin and Cherry  Family History  Problem Relation Age of Onset  . Adopted: Yes    Social History Social History  Substance Use Topics  . Smoking status: Never Smoker  . Smokeless tobacco: Never Used  . Alcohol use No    Review of Systems Constitutional: No fever/chills Eyes: No visual changes. ENT: No sore throat. Cardiovascular:  chest pain. Respiratory: Denies shortness of breath. Gastrointestinal: Nausea No abdominal pain. no vomiting.  No diarrhea.  No constipation. Genitourinary: Negative for dysuria. Musculoskeletal: Negative for back pain. Skin: Breast pain Neurological: Negative for headaches, focal weakness or numbness.  10-point ROS otherwise negative.  ____________________________________________   PHYSICAL EXAM:  VITAL SIGNS: ED Triage Vitals [11/03/16 2003]  Enc Vitals Group       BP (!) 129/95     Pulse Rate (!) 101     Resp 18     Temp 99 F (37.2 C)     Temp Source Oral     SpO2 100 %     Weight 169 lb (76.7 kg)     Height  (1.626 m)     Head Circumference      Peak Flow      Pain Score 9     Pain Loc      Pain Edu?      Excl. in GC?     Constitutional: Alert and oriented. Well appearing and in no acute distress. Eyes: Conjunctivae are normal. PERRL. EOMI. Head: Atraumatic. Nose: No congestion/rhinnorhea. Mouth/Throat: Mucous membranes are moist.  Oropharynx non-erythematous. Cardiovascular: Normal rate, regular rhythm. Grossly normal heart sounds.  Good peripheral circulation. Respiratory: Normal respiratory effort.  No retractions. Lungs CTAB. Gastrointestinal: Soft and nontender. No distention. Positive bowel sounds Musculoskeletal: No lower extremity tenderness nor edema.   Neurologic:  Normal speech and language.  Skin:  Breast with some mild erythema around the areola. Left breast tenderness palpation diffusely. No palpable masses or lumps.Marland Kitchen  Psychiatric: Mood and affect are normal.   ____________________________________________   LABS (all labs ordered are listed, but only abnormal results are displayed)  Labs Reviewed  CBC - Abnormal; Notable for the following:       Result Value   WBC 11.9 (*)    RDW 17.0 (*)    All other components within normal limits  BASIC METABOLIC PANEL  TROPONIN I  FIBRIN DERIVATIVES D-DIMER ([facility] ONLY)   ____________________________________________  EKG  ED ECG REPORT I, Rebecka Apley, the attending physician, personally viewed and interpreted this ECG.   Date: 11/03/2016  EKG Time: 2005  Rate: 100  Rhythm: normal sinus rhythm  Axis: normal  Intervals:none  ST&T Change: none  ____________________________________________  RADIOLOGY  CXR ____________________________________________   PROCEDURES  Procedure(s) performed: None  Procedures  Critical Care performed:  No  ____________________________________________   INITIAL IMPRESSION / ASSESSMENT AND PLAN / ED COURSE  Pertinent labs & imaging results that were available during my care of the patient were reviewed by me and considered in my medical decision making (see chart for details).  This is a 26 year old female who comes into the hospital today with some left breast pain that radiates into her neck, under her armpit and into her left arm. The patient's breast is exquisitely tender to palpation. She has some mild erythema around the nipple area but no specific rash. Given the patient's pain I'm concerned that she may have some mastitis. She has been having this for some time. I will give the patient some ibuprofen as well as some clindamycin.  Clinical Course as of Nov 04 825  Mon Nov 04, 2016  0159 No active cardiopulmonary disease. DG Chest 2 View [AW]    Clinical Course User Index [AW] Rebecka Apley, MD     ____________________________________________   FINAL CLINICAL IMPRESSION(S) / ED DIAGNOSES  Final diagnoses:  Mastitis  NEW MEDICATIONS STARTED DURING THIS VISIT:  Discharge Medication List as of 11/04/2016  3:23 AM    START taking these medications   Details  clindamycin (CLEOCIN) 300 MG capsule Take 1 capsule (300 mg total) by mouth 3 (three) times daily., Starting Mon 11/04/2016, Until Thu 11/14/2016, Print    !! ibuprofen (ADVIL,MOTRIN) 800 MG tablet Take 1 tablet (800 mg total) by mouth every 8 (eight) hours as needed., Starting Mon 11/04/2016, Print     !! - Potential duplicate medications found. Please discuss with provider.       Note:  This document was prepared using Dragon voice recognition software and may include unintentional dictation errors.    Rebecka Apley, MD 11/04/16 [phone]

## 2017-02-10 ENCOUNTER — Telehealth: Payer: Self-pay | Admitting: Obstetrics and Gynecology

## 2017-02-10 NOTE — Telephone Encounter (Signed)
Cytotec should be used cautiously while breastfeeding, so not going to prescribe it. Thx.

## 2017-02-10 NOTE — Telephone Encounter (Signed)
Pt is schedule for Anna Flores insertion for Tomorrow at 3:10 Pt is currently Breast feeding and hasn't started her Cycle Yet. Please advise if patient needs to take Cytotech before appointment. [facility] in [place].

## 2017-02-11 ENCOUNTER — Encounter: Payer: Self-pay | Admitting: Obstetrics and Gynecology

## 2017-02-11 ENCOUNTER — Other Ambulatory Visit: Payer: Self-pay | Admitting: Obstetrics and Gynecology

## 2017-02-11 ENCOUNTER — Ambulatory Visit (INDEPENDENT_AMBULATORY_CARE_PROVIDER_SITE_OTHER): Payer: Medicaid Other | Admitting: Obstetrics and Gynecology

## 2017-02-11 VITALS — BP 120/80 | HR 76 | Ht 64.0 in | Wt 186.0 lb

## 2017-02-11 DIAGNOSIS — Z30011 Encounter for initial prescription of contraceptive pills: Secondary | ICD-10-CM

## 2017-02-11 DIAGNOSIS — N912 Amenorrhea, unspecified: Secondary | ICD-10-CM

## 2017-02-11 DIAGNOSIS — Z3202 Encounter for pregnancy test, result negative: Secondary | ICD-10-CM | POA: Diagnosis not present

## 2017-02-11 LAB — POCT URINE PREGNANCY: PREG TEST UR: NEGATIVE

## 2017-02-11 MED ORDER — NORETHINDRONE 0.35 MG PO TABS: 1.0000 | ORAL_TABLET | Freq: Every day | ORAL | 7 refills | Status: DC

## 2017-02-11 NOTE — Progress Notes (Signed)
Chief Complaint  Patient presents with  . Contraception    HPI:      [name] is a 26 y.o. [phone] who LMP was No LMP recorded., presents today for IUD insertion but has changed her mind and would like to discuss other BC options. She is currently breastfeeding exclusively. She has had the [place] IUDs in the past. She had one placed PP 2/18 and then had terrible bleeding. The u/s confirmed IUD was not in correct place, so it was removed. Pt is just now presenting for f/u because she has been so scared to have the IUD placed again.  She did OCPs for about 3 months several yrs ago. She is interested in [place]. She is currently using condoms. She is amenorrheic.     Patient Active Problem List   Diagnosis Date Noted  . Postpartum care following vaginal delivery 08/12/2016  . Labor and delivery indication for care or intervention 08/11/2016  . Indication for care in labor or delivery 07/15/2016  . Nausea and vomiting in pregnancy 06/20/2016  . Noninfectious gastroenteritis and colitis 06/20/2016  . Amniotic fluid leaking 05/25/2016  . Preterm contractions 05/01/2016  . Vaginal discharge during pregnancy in second trimester 04/07/2016  . Hyperemesis gravidarum before end of [redacted] week gestation, dehydration 02/09/2016  . Hyperemesis arising during pregnancy 01/10/2016    Family History  Problem Relation Age of Onset  . Adopted: Yes  . Nevi Sister        DISPLASTIC NEVUS    Social History   Social History  . Marital status: Married    Spouse name: N/A  . Number of children: 14  . Years of education: 3   Occupational History  . HOMEMAKER    Social History Main Topics  . Smoking status: Never Smoker  . Smokeless tobacco: Never Used  . Alcohol use No  . Drug use: No  . Sexual activity: Yes    Birth control/ protection: None     Comment: possible tubal   Other Topics Concern  . Not on file   Social History Narrative  . No narrative on file      Current Outpatient Prescriptions:  .  Acetaminophen (TYLENOL EXTRA STRENGTH PO), Take 1 tablet by mouth as needed., Disp: , Rfl:  .  HYDROcodone-acetaminophen (NORCO) 5-325 MG tablet, Take 0.5 tablets by mouth every 6 (six) hours as needed for moderate pain. (Patient not taking: Reported on 02/11/2017), Disp: 12 tablet, Rfl: 0 .  ibuprofen (ADVIL,MOTRIN) 600 MG tablet, Take 1 tablet (600 mg total) by mouth every 6 (six) hours. (Patient not taking: Reported on 02/11/2017), Disp: 30 tablet, Rfl: 0 .  ibuprofen (ADVIL,MOTRIN) 800 MG tablet, Take 1 tablet (800 mg total) by mouth every 8 (eight) hours as needed. (Patient not taking: Reported on 02/11/2017), Disp: 15 tablet, Rfl: 0 .  norethindrone (MICRONOR,CAMILA,ERRIN) 0.35 MG tablet, Take 1 tablet (0.35 mg total) by mouth daily., Disp: 28 tablet, Rfl: 7 .  Prenatal Vit-Fe Fumarate-FA (PRENATAL MULTIVITAMIN) TABS tablet, Take 1 tablet by mouth daily at 12 noon. (Patient not taking: Reported on 02/11/2017), Disp: 30 tablet, Rfl: 0  Review of Systems  Constitutional: Negative for fever.  Gastrointestinal: Negative for blood in stool, constipation, diarrhea, nausea and vomiting.  Genitourinary: Negative for dyspareunia, dysuria, flank pain, frequency, hematuria, urgency, vaginal bleeding, vaginal discharge and vaginal pain.  Musculoskeletal: Negative for back pain.  Skin: Negative for rash.     OBJECTIVE:   Vitals:  BP 120/80  Pulse 76   Ht 5\' 4"  (1.626 m)   Wt 186 lb (84.4 kg)   BMI 31.93 kg/m   Physical Exam DEFERRED SINCE DONE PP 2/18  Results: Results for orders placed or performed in visit on 02/11/17 (from the past 24 hour(s))  POCT urine pregnancy     Status: Normal   Collection Time: 02/11/17  3:49 PM  Result Value Ref Range   Preg Test, Ur Negative Negative     Assessment/Plan: Encounter for initial prescription of contraceptive pills - Pros/cons BC options discussed. Pt can only have prog only while breastfeeding. Pt  would like POPs. [facility] or f/u for IUD prn. - Plan: norethindrone (MICRONOR,CAMILA,ERRIN) 0.35 MG tablet, POCT urine pregnancy  Amenorrhea - Start camila today. Condoms for 1 mo. Pt aware of importance of taking at same time QD. - Plan: POCT urine pregnancy     Meds ordered this encounter  Medications  . norethindrone (MICRONOR,CAMILA,ERRIN) 0.35 MG tablet    Sig: Take 1 tablet (0.35 mg total) by mouth daily.    Dispense:  28 tablet    Refill:  7      Return if symptoms worsen or fail to improve.  Alicia B. Copland, PA-C 02/11/2017 3:51 PM

## 2017-02-11 NOTE — Telephone Encounter (Signed)
NotedPenni Bombard. Liletta stock reserved for this patient.

## 2017-06-02 ENCOUNTER — Telehealth: Payer: Self-pay | Admitting: Obstetrics and Gynecology

## 2017-06-02 NOTE — Telephone Encounter (Signed)
Pt is calling to schedule liletta insertion. Schedule pt on 06/09/17 with ABC. Pt report still not have menstration due to still breast feeing. Please advise if patient needs to reschedule.

## 2017-06-02 NOTE — Telephone Encounter (Signed)
That is fine to do it then. Thx.

## 2017-06-03 NOTE — Telephone Encounter (Signed)
Liletta stock reserved for this patient. 

## 2017-06-09 ENCOUNTER — Encounter: Payer: Self-pay | Admitting: Obstetrics and Gynecology

## 2017-06-09 ENCOUNTER — Ambulatory Visit (INDEPENDENT_AMBULATORY_CARE_PROVIDER_SITE_OTHER): Payer: Medicaid Other | Admitting: Obstetrics and Gynecology

## 2017-06-09 VITALS — BP 120/78 | HR 84 | Ht 64.0 in | Wt 188.0 lb

## 2017-06-09 DIAGNOSIS — Z3202 Encounter for pregnancy test, result negative: Secondary | ICD-10-CM

## 2017-06-09 DIAGNOSIS — Z30430 Encounter for insertion of intrauterine contraceptive device: Secondary | ICD-10-CM | POA: Diagnosis not present

## 2017-06-09 LAB — POCT URINE PREGNANCY: Preg Test, Ur: NEGATIVE

## 2017-06-09 MED ORDER — LEVONORGESTREL 19.5 MCG/DAY IU IUD: 52.0000 mg | INTRAUTERINE_SYSTEM | Freq: Once | INTRAUTERINE | Status: DC

## 2017-06-09 NOTE — Progress Notes (Signed)
   Chief Complaint  Patient presents with  . Contraception    Liletta     IUD PROCEDURE NOTE:  Anna Flores is a 26 y.o. [phone] here for [place]  IUD insertion for contraception. No GYN concerns.  Last pap smear was normal. Pt is breastfeeding. Can't remember daily POPs. Wants Liletta IUD again (had in the past).   BP 120/78   Pulse 84   Ht 5\' 4"  (1.626 m)   Wt 188 lb (85.3 kg)   Breastfeeding? Yes   BMI 32.27 kg/m   IUD Insertion Procedure Note Patient identified, informed consent performed, consent signed.   Discussed risks of irregular bleeding, cramping, infection, malpositioning or misplacement of the IUD outside the uterus which may require further procedure such as laparoscopy, risk of failure <1%. Time out was performed.  Urine pregnancy test negative (pt is breastfeeding and not having a period).   A bimanual exam showed the uterus to be anteverted.  Speculum placed in the vagina.  Cervix visualized.  Cleaned with Betadine x 2.  Grasped anteriorly with a single tooth tenaculum.  Uterus sounded to 7.0 cm.   IUD placed per manufacturer's recommendations.  Strings trimmed to 3 cm. Tenaculum was removed, good hemostasis noted.  Patient tolerated procedure well.   Results for orders placed or performed in visit on 06/09/17 (from the past 24 hour(s))  POCT urine pregnancy     Status: Normal   Collection Time: 06/09/17 11:21 AM  Result Value Ref Range   Preg Test, Ur Negative Negative     ASSESSMENT:  Encounter for insertion of intrauterine contraceptive device (IUD) - Plan: Levonorgestrel IUD 52 mg, POCT urine pregnancy  Meds ordered this encounter  Medications  . Levonorgestrel IUD 52 mg    Plan:  Patient was given post-procedure instructions.  She was advised to have backup contraception for one week.   Call if you are having increasing pain, cramps or bleeding or if you have a fever greater than 100.4 degrees F., shaking chills, nausea or vomiting. Patient was also  asked to check IUD strings periodically and follow up in 4 weeks for IUD check.  Return in about 4 weeks (around 07/07/2017) for IUD chck.  Alicia B. Copland, PA-C 06/09/2017 11:21 AM

## 2017-06-09 NOTE — Patient Instructions (Signed)
  I value your feedback and appreciate you entrusting us with your care. If you get a survey, I would appreciate you taking the time to let us know what your experience was like. Thank you!  [facility] [phone]  Instructions after IUD insertion  Most women experience no significant problems after insertion of an IUD, however minor cramping and spotting for a few days is common. Cramps may be treated with ibuprofen 800mg  every 8 hours or Tylenol 650 mg every 4 hours. Contact [facility] immediately if you experience any of the following symptoms during the next week: temperature >99.6 degrees, worsening pelvic pain, abdominal pain, fainting, unusually heavy vaginal bleeding, foul vaginal discharge, or if you think you have expelled the IUD.  Nothing inserted in the vagina for 48 hours. You will be scheduled for a follow up visit in approximately four weeks.  You should check monthly to be sure you can feel the IUD strings in the upper vagina. If you are having a monthly period, try to check after each period. If you cannot feel the IUD strings,  contact [facility] immediately so we can do an exam to determine if the IUD has been expelled.   Please use backup protection until we can confirm the IUD is in place.  Call [facility] if you are exposed to or diagnosed with a sexually transmitted infection, as we will need to discuss whether it is safe for you to continue using an IUD.

## 2017-09-30 NOTE — Telephone Encounter (Signed)
Liletta received 06/09/17

## 2017-11-28 ENCOUNTER — Other Ambulatory Visit: Payer: Self-pay

## 2017-11-28 ENCOUNTER — Encounter: Payer: Self-pay | Admitting: Emergency Medicine

## 2017-11-28 ENCOUNTER — Emergency Department
Admission: EM | Admit: 2017-11-28 | Discharge: 2017-11-28 | Disposition: A | Discharge status: Home / Self Care | Payer: Medicaid Other | Attending: Emergency Medicine | Admitting: Emergency Medicine

## 2017-11-28 DIAGNOSIS — Y999 Unspecified external cause status: Secondary | ICD-10-CM | POA: Insufficient documentation

## 2017-11-28 DIAGNOSIS — W228XXA Striking against or struck by other objects, initial encounter: Secondary | ICD-10-CM | POA: Diagnosis not present

## 2017-11-28 DIAGNOSIS — Y939 Activity, unspecified: Secondary | ICD-10-CM | POA: Diagnosis not present

## 2017-11-28 DIAGNOSIS — S060X9A Concussion with loss of consciousness of unspecified duration, initial encounter: Secondary | ICD-10-CM | POA: Diagnosis not present

## 2017-11-28 DIAGNOSIS — S0990XA Unspecified injury of head, initial encounter: Secondary | ICD-10-CM | POA: Diagnosis present

## 2017-11-28 DIAGNOSIS — Y92810 Car as the place of occurrence of the external cause: Secondary | ICD-10-CM | POA: Insufficient documentation

## 2017-11-28 MED ORDER — BUTALBITAL-APAP-CAFFEINE 50-325-40 MG PO TABS
1.0000 | ORAL_TABLET | Freq: Once | ORAL | Status: AC
  Administered 2017-11-28: 1 via ORAL

## 2017-11-28 MED ORDER — BUTALBITAL-APAP-CAFFEINE 50-325-40 MG PO TABS
ORAL_TABLET | ORAL | Status: AC
  Filled 2017-11-28: qty 1

## 2017-11-28 MED ORDER — BUTALBITAL-APAP-CAFFEINE 50-325-40 MG PO TABS: 1.0000 | ORAL_TABLET | Freq: Four times a day (QID) | ORAL | 0 refills | Status: DC | PRN

## 2017-11-28 NOTE — ED Notes (Signed)
MD aware of decrease in pain. PT also reporting nausea has decreased as well. Pt attempting to drink water at this time.

## 2017-11-28 NOTE — ED Triage Notes (Signed)
Pt to ED via POV. Pt states that she was closing the tailgate on her minivan and did not move out of the way in time and it hit her on the right side of her head. Pt states that she did have a brief LOC but she is unsure of how long she was out. Pt is unsure if she hit her head again when she had LOC. Pt is in NAD at this time.

## 2017-11-28 NOTE — Discharge Instructions (Addendum)
Please seek medical attention for any high fevers, chest pain, shortness of breath, change in behavior, persistent vomiting, bloody stool or any other new or concerning symptoms.  
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
## 2017-11-28 NOTE — ED Notes (Signed)
MD at bedside. 

## 2017-12-24 DIAGNOSIS — O0000 Abdominal pregnancy without intrauterine pregnancy: Secondary | ICD-10-CM | POA: Insufficient documentation

## 2017-12-29 ENCOUNTER — Emergency Department
Admission: EM | Admit: 2017-12-29 | Discharge: 2017-12-29 | Disposition: A | Discharge status: Home / Self Care | Payer: Medicaid Other | Attending: Emergency Medicine | Admitting: Emergency Medicine

## 2017-12-29 ENCOUNTER — Encounter: Payer: Self-pay | Admitting: Emergency Medicine

## 2017-12-29 DIAGNOSIS — Z3A00 Weeks of gestation of pregnancy not specified: Secondary | ICD-10-CM | POA: Insufficient documentation

## 2017-12-29 DIAGNOSIS — O0091 Unspecified ectopic pregnancy with intrauterine pregnancy: Secondary | ICD-10-CM | POA: Diagnosis not present

## 2017-12-29 DIAGNOSIS — Z5321 Procedure and treatment not carried out due to patient leaving prior to being seen by health care provider: Secondary | ICD-10-CM | POA: Diagnosis not present

## 2017-12-29 DIAGNOSIS — R112 Nausea with vomiting, unspecified: Secondary | ICD-10-CM | POA: Diagnosis present

## 2017-12-29 LAB — COMPREHENSIVE METABOLIC PANEL
ALT: 59 U/L — ABNORMAL HIGH (ref 14–54)
AST: 25 U/L (ref 15–41)
Albumin: 4.5 g/dL (ref 3.5–5.0)
Alkaline Phosphatase: 74 U/L (ref 38–126)
Anion gap: 9 (ref 5–15)
BUN: 8 mg/dL (ref 6–20)
CHLORIDE: 99 mmol/L — AB (ref 101–111)
CO2: 25 mmol/L (ref 22–32)
Calcium: 9.6 mg/dL (ref 8.9–10.3)
Creatinine, Ser: 0.67 mg/dL (ref 0.44–1.00)
GFR calc Af Amer: >60 mL/min (ref >60)
Glucose, Bld: 94 mg/dL (ref 65–99)
POTASSIUM: 3.7 mmol/L (ref 3.5–5.1)
Sodium: 133 mmol/L — ABNORMAL LOW (ref 135–145)
Total Bilirubin: 1 mg/dL (ref 0.3–1.2)
Total Protein: 8.2 g/dL — ABNORMAL HIGH (ref 6.5–8.1)

## 2017-12-29 LAB — CBC
HEMATOCRIT: 39.5 % (ref 35.0–47.0)
HEMOGLOBIN: 14 g/dL (ref 12.0–16.0)
MCH: 30.7 pg (ref 26.0–34.0)
MCHC: 35.4 g/dL (ref 32.0–36.0)
MCV: 86.7 fL (ref 80.0–100.0)
Platelets: 294 10*3/uL (ref 150–440)
RBC: 4.56 MIL/uL (ref 3.80–5.20)
RDW: 12.9 % (ref 11.5–14.5)
WBC: 10.4 10*3/uL (ref 3.6–11.0)

## 2017-12-29 LAB — LIPASE, BLOOD: LIPASE: 30 U/L (ref 11–51)

## 2017-12-29 LAB — HCG, QUANTITATIVE, PREGNANCY: hCG, Beta Chain, Quant, S: 59164 m[IU]/mL — ABNORMAL HIGH (ref <5)

## 2017-12-29 MED ORDER — ONDANSETRON 4 MG PO TBDP
4.0000 mg | ORAL_TABLET | Freq: Once | ORAL | Status: AC | PRN
  Administered 2017-12-29: 4 mg via ORAL
  Filled 2017-12-29: qty 1

## 2017-12-29 NOTE — ED Notes (Signed)
Called X 2 for room

## 2017-12-29 NOTE — ED Notes (Signed)
Called X1 for room

## 2017-12-29 NOTE — ED Triage Notes (Signed)
Pt comes into the ED via POV c/o nausea and vomiting.  Pateint states that she is 7.[redacted] weeks pregnant but that she also recently underwent a surgery for an ectopic pregnancy.  Patient asked to clarify and she states that there was a positive pregnancy in her uterus as well as her fallopian tube and they removed the fallopian tube part.  Patient states the surgery was last Wednesday at [facility].  Patient has had 11 episodes of emesis since last night.  Patient has even and unlabored respirations at this time.

## 2017-12-29 NOTE — ED Notes (Signed)
Called X 3 no answer for room.

## 2017-12-30 ENCOUNTER — Telehealth: Payer: Self-pay | Admitting: Emergency Medicine

## 2017-12-30 NOTE — Telephone Encounter (Signed)
Called patient due to lwot to inquire about condition and follow up plans. Left message.   

## 2017-12-31 ENCOUNTER — Telehealth: Payer: Self-pay

## 2017-12-31 NOTE — Telephone Encounter (Signed)
Pt called after hour nurse 5/27 for preg and non stop vomiting.  She had surgery at [facility] last Wed.  Apologized for not calling her yesterday. Today she states she is lightheaded, can't keep anything down, non stop vomiting.  She states she was told by [place] that we couldn't see her until preg m'caid is active.  Adv pt to be seen at [facility] since that saw her last and performed surgery on her.

## 2018-01-05 ENCOUNTER — Telehealth: Payer: Self-pay

## 2018-01-05 NOTE — Telephone Encounter (Signed)
Pt calling triage asking for refill on Zofran, she has not been seen with us for NOB yet it is scheduled for Friday 01/09/18 with Erskine SquibbJane. Pt has been seen in ED for the n/v. Please advise. Thank you

## 2018-01-06 NOTE — Telephone Encounter (Signed)
Pt called back and is aware she can pick up samples of Bonjesta. Pt was asking if she can take it more often then recommended, I advised pt I would speak with one of our MDs and see what they suggested

## 2018-01-06 NOTE — Telephone Encounter (Signed)
Left vm with pt to have her call if she would like to do the samples.

## 2018-01-06 NOTE — Telephone Encounter (Signed)
I would have her pick up some samples on Bonjesta from the office as that is first line

## 2018-01-09 ENCOUNTER — Ambulatory Visit (INDEPENDENT_AMBULATORY_CARE_PROVIDER_SITE_OTHER): Payer: Medicaid Other

## 2018-01-09 ENCOUNTER — Other Ambulatory Visit: Payer: Self-pay | Admitting: Advanced Practice Midwife

## 2018-01-09 ENCOUNTER — Encounter: Payer: Self-pay | Admitting: Advanced Practice Midwife

## 2018-01-09 ENCOUNTER — Ambulatory Visit (INDEPENDENT_AMBULATORY_CARE_PROVIDER_SITE_OTHER): Payer: Medicaid Other | Admitting: Advanced Practice Midwife

## 2018-01-09 VITALS — BP 100/60 | Wt 190.0 lb

## 2018-01-09 DIAGNOSIS — O99211 Obesity complicating pregnancy, first trimester: Secondary | ICD-10-CM | POA: Diagnosis not present

## 2018-01-09 DIAGNOSIS — Z3A01 Less than 8 weeks gestation of pregnancy: Secondary | ICD-10-CM | POA: Diagnosis not present

## 2018-01-09 DIAGNOSIS — O0990 Supervision of high risk pregnancy, unspecified, unspecified trimester: Secondary | ICD-10-CM

## 2018-01-09 DIAGNOSIS — O219 Vomiting of pregnancy, unspecified: Secondary | ICD-10-CM

## 2018-01-09 DIAGNOSIS — O0991 Supervision of high risk pregnancy, unspecified, first trimester: Secondary | ICD-10-CM | POA: Diagnosis not present

## 2018-01-09 DIAGNOSIS — O99210 Obesity complicating pregnancy, unspecified trimester: Secondary | ICD-10-CM | POA: Insufficient documentation

## 2018-01-09 DIAGNOSIS — Z113 Encounter for screening for infections with a predominantly sexual mode of transmission: Secondary | ICD-10-CM

## 2018-01-09 DIAGNOSIS — Z6833 Body mass index (BMI) 33.0-33.9, adult: Secondary | ICD-10-CM

## 2018-01-09 MED ORDER — ONDANSETRON 4 MG PO TBDP: 4.0000 mg | ORAL_TABLET | Freq: Three times a day (TID) | ORAL | 1 refills | Status: AC | PRN

## 2018-01-09 NOTE — Progress Notes (Signed)
NOB confirmed at [facility]. C/o N/V all day 4-5x day.

## 2018-01-09 NOTE — Patient Instructions (Signed)
Eating Plan for Hyperemesis Gravidarum Hyperemesis gravidarum is a severe form of morning sickness. Because this condition causes severe nausea and vomiting, it can lead to dehydration, malnutrition, and weight loss. One way to lessen the symptoms of nausea and vomiting is to follow the eating plan for hyperemesis gravidarum. It is often used along with prescribed medicines to control your symptoms. What can I do to relieve my symptoms? Listen to your body. Everyone is different and has different preferences. Find what works best for you. Take any of the following actions that are helpful to you:  Eat and drink slowly.  Eat 5-6 small meals daily instead of 3 large meals.  Eat crackers before you get out of bed in the morning.  Try having a snack in the middle of the night.  Starchy foods are usually tolerated well. Examples include cereal, toast, bread, potatoes, pasta, rice, and pretzels.  Ginger may help with nausea. Add  tsp ground ginger to hot tea or choose ginger tea.  Try drinking 100% fruit juice or an electrolyte drink. An electrolyte drink contains sodium, potassium, and chloride.  Continue to take your prenatal vitamins as told by your health care provider. If you are having trouble taking your prenatal vitamins, talk with your health care provider about different options.  Include at least 1 serving of protein with your meals and snacks. Protein options include meats or poultry, beans, nuts, eggs, and yogurt. Try eating a protein-rich snack before bed. Examples of these snacks include cheese and crackers or half of a peanut butter or [place] sandwich.  Consider eliminating foods that trigger your symptoms. These may include spicy foods, coffee, high-fat foods, very sweet foods, and acidic foods.  Try meals that have more protein combined with bland, salty, lower-fat, and dry foods, such as nuts, seeds, pretzels, crackers, and cereal.  Talk with your healthcare provider about  starting a supplement of vitamin B6.  Have fluids that are cold, clear, and carbonated or sour. Examples include lemonade, ginger ale, lemon-lime soda, ice water, and sparkling water.  Try lemon or mint tea.  Try brushing your teeth or using a mouth rinse after meals.  What should I avoid to reduce my symptoms? Avoiding some of the following things may help reduce your symptoms.  Foods with strong smells. Try eating meals in well-ventilated areas that are free of odors.  Drinking water or other beverages with meals. Try not to drink anything during the 30 minutes before and after your meals.  Drinking more than 1 cup of fluid at a time. Sometimes using a straw helps.  Fried or high-fat foods, such as butter and cream sauces.  Spicy foods.  Skipping meals as best as you can. Nausea can be more intense on an empty stomach. If you cannot tolerate food at that time, do not force it. Try sucking on ice chips or other frozen items, and make up for missed calories later.  Lying down within 2 hours after eating.  Environmental triggers. These may include smoky rooms, closed spaces, rooms with strong smells, warm or humid places, overly loud and noisy rooms, and rooms with motion or flickering lights.  Quick and sudden changes in your movement.  This information is not intended to replace advice given to you by your health care provider. Make sure you discuss any questions you have with your health care provider. Document Released: 05/19/2007 Document Revised: 03/20/2016 Document Reviewed: 02/20/2016 Elsevier Interactive Patient Education  2018 [employer]. Exercise During Pregnancy For  people of all ages, exercise is an important part of being healthy. Exercise improves heart and lung function and helps to maintain strength, flexibility, and a healthy body weight. Exercise also boosts energy levels and elevates mood. For most women, maintaining an exercise routine throughout pregnancy is  recommended. It is only on rare occasions and with certain medical conditions or pregnancy complications that women may be asked to limit or avoid exercise during pregnancy. What are some other benefits to exercising during pregnancy? Along with maintaining strength and flexibility, exercising throughout pregnancy can help to:  Keep strength in muscles that are very important during labor and childbirth.  Decrease low back pain during pregnancy.  Decrease the risk of developing gestational diabetes mellitus (GDM).  Improve blood sugar (glucose) control for women who have GDM.  Decrease the risk of developing preeclampsia. This is a serious condition that causes high blood pressure along with other symptoms, such as swelling and headaches.  Decrease the risk of cesarean delivery.  Speed up the recovery after giving birth.  How often should I exercise? Unless your health care provider gives you different instructions, you should try to exercise on most days or all days of the week. In general, try to exercise with moderate intensity for about 150 minutes per week. This can be spread out across several days, such as exercising for 30 minutes per day on 5 days of each week. You can tell that you are exercising at a moderate intensity if you have a higher heart rate and faster breathing, but you are still able to hold a conversation. What types of moderate-intensity exercise are recommended during pregnancy? There are many types of exercise that are safe for you to do during pregnancy. Unless your health care provider gives you different instructions, do a variety of exercises that safely increase your heart and breathing (cardiopulmonary) rates and help you to build and maintain muscle strength (strength training). You should always be able to talk in full sentences while exercising during pregnancy. Some examples of exercising that is safe to do during pregnancy include:  Brisk walking or  hiking.  Swimming.  Water aerobics.  Riding a stationary bike.  Strength training.  Modified yoga or Pilates. Tell your instructor that you are pregnant. Avoid overstretching and avoid lying on your back for long periods of time.  Running or jogging. Only choose this type of exercise if: ? You ran or jogged regularly before your pregnancy. ? You can run or jog and still talk in complete sentences.  What types of exercise should I not do during pregnancy? Depending on your level of fitness and whether you exercised regularly before your pregnancy, you may be advised to limit vigorous-intensity exercise during your pregnancy. You can tell that you are exercising at a vigorous intensity if you are breathing much harder and faster and cannot hold a conversation while exercising. Some examples of exercising that you should avoid during pregnancy include:  Contact sports.  Activities that place you at risk for falling on or being hit in the belly, such as downhill skiing, water skiing, surfing, rock climbing, cycling, gymnastics, and horseback riding.  Scuba diving.  Sky diving.  Yoga or Pilates in a room that is heated to extreme temperatures ("hot yoga" or "hot Pilates").  Jogging or running, unless you ran or jogged regularly before your pregnancy. While jogging or running, you should always be able to talk in full sentences. Do not run or jog so vigorously that you are  unable to have a conversation.  If you are not used to exercising at elevation (more than 6,000 feet above sea level), do not do so during your pregnancy.  When should I avoid exercising during pregnancy? Certain medical conditions can make it unsafe to exercise during pregnancy, or they may increase your risk of miscarriage or early labor and birth. Some of these conditions include:  Some types of heart disease.  Some types of lung disease.  Placenta previa. This is when the placenta partially or completely  covers the opening of the uterus (cervix).  Frequent bleeding from the vagina during your pregnancy.  Incompetent cervix. This is when your cervix does not remain as tightly closed during pregnancy as it should.  Premature labor.  Ruptured membranes. This is when the protective sac (amniotic sac) opens up and amniotic fluid leaks from your vagina.  Severely low blood count (anemia).  Preeclampsia or pregnancy-caused high blood pressure.  Carrying more than one baby (multiple gestation) and having an additional risk of early labor.  Poorly controlled diabetes.  Being severely underweight or severely overweight.  Intrauterine growth restriction. This is when your baby's growth and development during pregnancy are slower than expected.  Other medical conditions. Ask your health care provider if any apply to you.  What else should I know about exercising during pregnancy? You should take these precautions while exercising during pregnancy:  Avoid overheating. ? Wear loose-fitting, breathable clothes. ? Do not exercise in very high temperatures.  Avoid dehydration. Drink enough water before, during, and after exercise to keep your urine clear or pale yellow.  Avoid overstretching. Because of hormone changes during pregnancy, it is easy to overstretch muscles, tendons, and ligaments during pregnancy.  Start slowly and ask your health care provider to recommend types of exercise that are safe for you, if exercising regularly is new for you.  Pregnancy is not a time for exercising to lose weight. When should I seek medical care? You should stop exercising and call your health care provider if you have any unusual symptoms, such as:  Mild uterine contractions or abdominal cramping.  Dizziness that does not improve with rest.  When should I seek immediate medical care? You should stop exercising and call your local emergency services (911 in the [place] if you have any unusual  symptoms, such as:  Sudden, severe pain in your low back or your belly.  Uterine contractions or abdominal cramping that do not improve with rest.  Chest pain.  Bleeding or fluid leaking from your vagina.  Shortness of breath.  This information is not intended to replace advice given to you by your health care provider. Make sure you discuss any questions you have with your health care provider. Document Released: 07/22/2005 Document Revised: 12/20/2015 Document Reviewed: 09/29/2014 Elsevier Interactive Patient Education  2018 [employer]. Eating Plan for Pregnant Women While you are pregnant, your body will require additional nutrition to help support your growing baby. It is recommended that you consume:  150 additional calories each day during your first trimester.  300 additional calories each day during your second trimester.  300 additional calories each day during your third trimester.  Eating a healthy, well-balanced diet is very important for your health and for your baby's health. You also have a higher need for some vitamins and minerals, such as folic acid, calcium, iron, and vitamin D. What do I need to know about eating during pregnancy?  Do not try to lose weight or go on  a diet during pregnancy.  Choose healthy, nutritious foods. Choose  of a sandwich with a glass of milk instead of a candy bar or a high-calorie sugar-sweetened beverage.  Limit your overall intake of foods that have "empty calories." These are foods that have little nutritional value, such as sweets, desserts, candies, sugar-sweetened beverages, and fried foods.  Eat a variety of foods, especially fruits and vegetables.  Take a prenatal vitamin to help meet the additional needs during pregnancy, specifically for folic acid, iron, calcium, and vitamin D.  Remember to stay active. Ask your health care provider for exercise recommendations that are specific to you.  Practice good food safety and  cleanliness, such as washing your hands before you eat and after you prepare raw meat. This helps to prevent foodborne illnesses, such as listeriosis, that can be very dangerous for your baby. Ask your health care provider for more information about listeriosis. What does 150 extra calories look like? Healthy options for an additional 150 calories each day could be any of the following:  Plain low-fat yogurt (6-8 oz) with  cup of berries.  1 apple with 2 teaspoons of peanut butter.  Cut-up vegetables with  cup of hummus.  Low-fat chocolate milk (8 oz or 1 cup).  1 string cheese with 1 medium orange.   of a peanut butter and jelly sandwich on whole-wheat bread (1 tsp of peanut butter).  For 300 calories, you could eat two of those healthy options each day. What is a healthy amount of weight to gain? The recommended amount of weight for you to gain is based on your pre-pregnancy BMI. If your pre-pregnancy BMI was:  Less than 18 (underweight), you should gain 28-40 lb.  18-24.9 (normal), you should gain 25-35 lb.  25-29.9 (overweight), you should gain 15-25 lb.  Greater than 30 (obese), you should gain 11-20 lb.  What if I am having twins or multiples? Generally, pregnant women who will be having twins or multiples may need to increase their daily calories by 300-600 calories each day. The recommended range for total weight gain is 25-54 lb, depending on your pre-pregnancy BMI. Talk with your health care provider for specific guidance about additional nutritional needs, weight gain, and exercise during your pregnancy. What foods can I eat? Grains Any grains. Try to choose whole grains, such as whole-wheat bread, oatmeal, or brown rice. Vegetables Any vegetables. Try to eat a variety of colors and types of vegetables to get a full range of vitamins and minerals. Remember to wash your vegetables well before eating. Fruits Any fruits. Try to eat a variety of colors and types of fruit  to get a full range of vitamins and minerals. Remember to wash your fruits well before eating. Meats and Other Protein Sources Lean meats, including chicken, [place], fish, and lean cuts of beef, veal, or pork. Make sure that all meats are cooked to "well done." Tofu. Tempeh. Beans. Eggs. Peanut butter and other nut butters. Seafood, such as shrimp, crab, and lobster. If you choose fish, select types that are higher in omega-3 fatty acids, including salmon, herring, mussels, trout, sardines, and pollock. Make sure that all meats are cooked to food-safe temperatures. Dairy Pasteurized milk and milk alternatives. Pasteurized yogurt and pasteurized cheese. Cottage cheese. Sour cream. Beverages Water. Juices that contain 100% fruit juice or vegetable juice. Caffeine-free teas and decaffeinated coffee. Drinks that contain caffeine are okay to drink, but it is better to avoid caffeine. Keep your total caffeine intake to less  than 200 mg each day (12 oz of coffee, tea, or soda) or as directed by your health care provider. Condiments Any pasteurized condiments. Sweets and Desserts Any sweets and desserts. Fats and Oils Any fats and oils. The items listed above may not be a complete list of recommended foods or beverages. Contact your dietitian for more options. What foods are not recommended? Vegetables Unpasteurized (raw) vegetable juices. Fruits Unpasteurized (raw) fruit juices. Meats and Other Protein Sources Cured meats that have nitrates, such as bacon, salami, and hotdogs. Luncheon meats, bologna, or other deli meats (unless they are reheated until they are steaming hot). Refrigerated pate, meat spreads from a meat counter, smoked seafood that is found in the refrigerated section of a store. Raw fish, such as sushi or sashimi. High mercury content fish, such as tilefish, shark, swordfish, and king mackerel. Raw meats, such as tuna or beef tartare. Undercooked meats and poultry. Make sure that all  meats are cooked to food-safe temperatures. Dairy Unpasteurized (raw) milk and any foods that have raw milk in them. Soft cheeses, such as feta, queso blanco, queso fresco, Brie, Camembert cheeses, blue-veined cheeses, and Panela cheese (unless it is made with pasteurized milk, which must be stated on the label). Beverages Alcohol. Sugar-sweetened beverages, such as sodas, teas, or energy drinks. Condiments Homemade fermented foods and drinks, such as pickles, sauerkraut, or kombucha drinks. (Store-bought pasteurized versions of these are okay.) Other Salads that are made in the store, such as ham salad, chicken salad, egg salad, tuna salad, and seafood salad. The items listed above may not be a complete list of foods and beverages to avoid. Contact your dietitian for more information. This information is not intended to replace advice given to you by your health care provider. Make sure you discuss any questions you have with your health care provider. Document Released: 05/06/2014 Document Revised: 12/28/2015 Document Reviewed: 01/04/2014 Elsevier Interactive Patient Education  2018 [employer]. Prenatal Care WHAT IS PRENATAL CARE? Prenatal care is the process of caring for a pregnant woman before she gives birth. Prenatal care makes sure that she and her baby remain as healthy as possible throughout pregnancy. Prenatal care may be provided by a midwife, family practice health care provider, or a childbirth and pregnancy specialist (obstetrician). Prenatal care may include physical examinations, testing, treatments, and education on nutrition, lifestyle, and social support services. WHY IS PRENATAL CARE SO IMPORTANT? Early and consistent prenatal care increases the chance that you and your baby will remain healthy throughout your pregnancy. This type of care also decreases a baby's risk of being born too early (prematurely), or being born smaller than expected (small for gestational age). Any  underlying medical conditions you may have that could pose a risk during your pregnancy are discussed during prenatal care visits. You will also be monitored regularly for any new conditions that may arise during your pregnancy so they can be treated quickly and effectively. WHAT HAPPENS DURING PRENATAL CARE VISITS? Prenatal care visits may include the following: Discussion Tell your health care provider about any new signs or symptoms you have experienced since your last visit. These might include:  Nausea or vomiting.  Increased or decreased level of energy.  Difficulty sleeping.  Back or leg pain.  Weight changes.  Frequent urination.  Shortness of breath with physical activity.  Changes in your skin, such as the development of a rash or itchiness.  Vaginal discharge or bleeding.  Feelings of excitement or nervousness.  Changes in your baby's movements.  You may want to write down any questions or topics you want to discuss with your health care provider and bring them with you to your appointment. Examination During your first prenatal care visit, you will likely have a complete physical exam. Your health care provider will often examine your vagina, cervix, and the position of your uterus, as well as check your heart, lungs, and other body systems. As your pregnancy progresses, your health care provider will measure the size of your uterus and your baby's position inside your uterus. He or she may also examine you for early signs of labor. Your prenatal visits may also include checking your blood pressure and, after about 10-12 weeks of pregnancy, listening to your baby's heartbeat. Testing Regular testing often includes:  Urinalysis. This checks your urine for glucose, protein, or signs of infection.  Blood count. This checks the levels of white and red blood cells in your body.  Tests for sexually transmitted infections (STIs). Testing for STIs at the beginning of  pregnancy is routinely done and is required in many states.  Antibody testing. You will be checked to see if you are immune to certain illnesses, such as rubella, that can affect a developing fetus.  Glucose screen. Around 24-28 weeks of pregnancy, your blood glucose level will be checked for signs of gestational diabetes. Follow-up tests may be recommended.  Group B strep. This is a bacteria that is commonly found inside a woman's vagina. This test will inform your health care provider if you need an antibiotic to reduce the amount of this bacteria in your body prior to labor and childbirth.  Ultrasound. Many pregnant women undergo an ultrasound screening around 18-20 weeks of pregnancy to evaluate the health of the fetus and check for any developmental abnormalities.  HIV (human immunodeficiency virus) testing. Early in your pregnancy, you will be screened for HIV. If you are at high risk for HIV, this test may be repeated during your third trimester of pregnancy.  You may be offered other testing based on your age, personal or family medical history, or other factors. HOW OFTEN SHOULD I PLAN TO SEE MY HEALTH CARE PROVIDER FOR PRENATAL CARE? Your prenatal care check-up schedule depends on any medical conditions you have before, or develop during, your pregnancy. If you do not have any underlying medical conditions, you will likely be seen for checkups:  Monthly, during the first 6 months of pregnancy.  Twice a month during months 7 and 8 of pregnancy.  Weekly starting in the 9th month of pregnancy and until delivery.  If you develop signs of early labor or other concerning signs or symptoms, you may need to see your health care provider more often. Ask your health care provider what prenatal care schedule is best for you. WHAT CAN I DO TO KEEP MYSELF AND MY BABY AS HEALTHY AS POSSIBLE DURING MY PREGNANCY?  Take a prenatal vitamin containing 400 micrograms (0.4 mg) of folic acid every day.  Your health care provider may also ask you to take additional vitamins such as iodine, vitamin D, iron, copper, and zinc.  Take 1500-2000 mg of calcium daily starting at your 20th week of pregnancy until you deliver your baby.  Make sure you are up to date on your vaccinations. Unless directed otherwise by your health care provider: ? You should receive a tetanus, diphtheria, and pertussis (Tdap) vaccination between the 27th and 36th week of your pregnancy, regardless of when your last Tdap immunization occurred. This helps protect  your baby from whooping cough (pertussis) after he or she is born. ? You should receive an annual inactivated influenza vaccine (IIV) to help protect you and your baby from influenza. This can be done at any point during your pregnancy.  Eat a well-rounded diet that includes: ? Fresh fruits and vegetables. ? Lean proteins. ? Calcium-rich foods such as milk, yogurt, hard cheeses, and dark, leafy greens. ? Whole grain breads.  Do noteat seafood high in mercury, including: ? Swordfish. ? Tilefish. ? Shark. ? King mackerel. ? More than 6 oz tuna per week.  Do not eat: ? Raw or undercooked meats or eggs. ? Unpasteurized foods, such as soft cheeses (brie, blue, or feta), juices, and milks. ? Lunch meats. ? Hot dogs that have not been heated until they are steaming.  Drink enough water to keep your urine clear or pale yellow. For many women, this may be 10 or more 8 oz glasses of water each day. Keeping yourself hydrated helps deliver nutrients to your baby and may prevent the start of pre-term uterine contractions.  Do not use any tobacco products including cigarettes, chewing tobacco, or electronic cigarettes. If you need help quitting, ask your health care provider.  Do not drink beverages containing alcohol. No safe level of alcohol consumption during pregnancy has been determined.  Do not use any illegal drugs. These can harm your developing baby or cause a  miscarriage.  Ask your health care provider or pharmacist before taking any prescription or over-the-counter medicines, herbs, or supplements.  Limit your caffeine intake to no more than 200 mg per day.  Exercise. Unless told otherwise by your health care provider, try to get 30 minutes of moderate exercise most days of the week. Do not  do high-impact activities, contact sports, or activities with a high risk of falling, such as horseback riding or downhill skiing.  Get plenty of rest.  Avoid anything that raises your body temperature, such as hot tubs and saunas.  If you own a cat, do not empty its litter box. Bacteria contained in cat feces can cause an infection called toxoplasmosis. This can result in serious harm to the fetus.  Stay away from chemicals such as insecticides, lead, mercury, and cleaning or paint products that contain solvents.  Do not have any X-rays taken unless medically necessary.  Take a childbirth and breastfeeding preparation class. Ask your health care provider if you need a referral or recommendation.  This information is not intended to replace advice given to you by your health care provider. Make sure you discuss any questions you have with your health care provider. Document Released: 07/25/2003 Document Revised: 12/25/2015 Document Reviewed: 10/06/2013 Elsevier Interactive Patient Education  2017 [employer]

## 2018-01-09 NOTE — Progress Notes (Signed)
New Obstetric Patient H&P    Chief Complaint: "Desires prenatal care"   History of Present Illness: Patient is a 27 y.o. [phone] Not Hispanic or Latino female, presents with amenorrhea and positive home pregnancy test. Patient's last menstrual period was 11/07/2017. and based on ultrasound today, her EDD is Estimated Date of Delivery: 08/25/18 and her EGA is [redacted]w[redacted]d. Her IUD fell out on 11/04/2017. She had not had any periods for the previous 9 months while she had the IUD. Her last pap smear was 4 months ago and was no abnormalities.    She had a urine pregnancy test which was positive 4 week(s)  ago. Her last menstrual period was normal and lasted for  7 day(s). Since her LMP she claims she has experienced breast tenderness, fatigue, nausea, vomiting. She denies vaginal bleeding. Her past medical history is noncontributory. Her prior pregnancies are notable for hyperemesis with all. GDM A1 with G2 and G3. She has not taken the gtt due to nausea/vomiting and instead she is checking her blood sugar regularly. Currently she says her sugar values are mostly in the normal range. She had laparoscopic surgery 2 weeks ago at [facility] due to unknown ectopic pregnancy because of increased pain. She has not had an ultrasound for dating.  Since her LMP, she admits to the use of tobacco products  no She claims she has lost  2 pounds since the start of her pregnancy.  There are cats in the home in the home  no  She admits close contact with children on a regular basis  yes  She has had chicken pox in the past no She has had Tuberculosis exposures, symptoms, or previously tested positive for TB   no Current or past history of domestic violence. no  Genetic Screening/Teratology Counseling: (Includes patient, baby's father, or anyone in either family with:)   1. Patient's age >/= 28 at [facility]  no 2. Thalassemia ([place], [place], Mediterranean, or Asian background): MCV<80  no 3. Neural tube defect (meningomyelocele,  spina bifida, anencephaly)  no 4. Congenital heart defect  no  5. Down syndrome  no 6. Tay-Sachs (Jewish, [place]  no 7. Canavan's Disease  no 8. Sickle cell disease or trait (African)  no  9. Hemophilia or other blood disorders  no  10. Muscular dystrophy  no  11. Cystic fibrosis  no  12. Huntington's Chorea  no  13. Mental retardation/autism  no 14. Other inherited genetic or chromosomal disorder  no 15. Maternal metabolic disorder (DM, PKU, etc)  no 16. Patient or FOB with a child with a birth defect not listed above no  16a. Patient or FOB with a birth defect themselves no 17. Recurrent pregnancy loss, or stillbirth  no  18. Any medications since LMP other than prenatal vitamins (include vitamins, supplements, OTC meds, drugs, alcohol)  no 19. Any other genetic/environmental exposure to discuss  no  Infection History:   1. Lives with someone with TB or TB exposed  no  2. Patient or partner has history of genital herpes  no 3. Rash or viral illness since LMP  no 4. History of STI (GC, CT, HPV, syphilis, HIV)  no 5. History of recent travel :  no  Other pertinent information:    Patient is adopted and does not know her family history of genetic abnormalities.   Patient has been told that there is a problem with her pregnancy medicaid and currently it is only active until the end of this month.  She would like to come back before the end of the month for cfDNA genetic screening and other lab work.    Review of Systems:10 point review of systems negative unless otherwise noted in HPI  Past Medical History:  Past Medical History:  Diagnosis Date  . Abdominal pain 10/26/2013  . Anemia 2013  . Anxiety   . Depression   . History of Papanicolaou smear of cervix 10/02/2016   NEG  . Menometrorrhagia 10/26/2013  . Pregnancy    DELIVERED 08/12/16 - JEG    Past Surgical History:  Past Surgical History:  Procedure Laterality Date  . TONSILLECTOMY      Gynecologic  History: Patient's last menstrual period was 11/07/2017.  Obstetric History: [id number]  Family History:  Family History  Adopted: Yes  Problem Relation Age of Onset  . Nevi Sister        DISPLASTIC NEVUS    Social History:  Social History   Socioeconomic History  . Marital status: Married    Spouse name: Not on file  . Number of children: 14  . Years of education: 3  . Highest education level: Not on file  Occupational History  . Occupation: HOMEMAKER  Social Needs  . Financial resource strain: Not on file  . Food insecurity:    Worry: Not on file    Inability: Not on file  . Transportation needs:    Medical: Not on file    Non-medical: Not on file  Tobacco Use  . Smoking status: Never Smoker  . Smokeless tobacco: Never Used  Substance and Sexual Activity  . Alcohol use: No  . Drug use: No  . Sexual activity: Yes    Birth control/protection: Pill  Lifestyle  . Physical activity:    Days per week: Not on file    Minutes per session: Not on file  . Stress: Not on file  Relationships  . Social connections:    Talks on phone: Not on file    Gets together: Not on file    Attends religious service: Not on file    Active member of club or organization: Not on file    Attends meetings of clubs or organizations: Not on file    Relationship status: Not on file  . Intimate partner violence:    Fear of current or ex partner: Not on file    Emotionally abused: Not on file    Physically abused: Not on file    Forced sexual activity: Not on file  Other Topics Concern  . Not on file  Social History Narrative  . Not on file    Allergies:  Allergies  Allergen Reactions  . Amoxicillin Hives  . Cherry Hives    Medications: Prior to Admission medications   Medication Sig Start Date End Date Taking? Authorizing Provider  Acetaminophen (TYLENOL EXTRA STRENGTH PO) Take 1 tablet by mouth as needed.   Yes [provider]  Doxylamine-Pyridoxine ER (BONJESTA)  20-20 MG TBCR Take by mouth.   Yes [provider]  promethazine (PHENERGAN) 25 MG tablet TAKE 1 TABLET BY MOUTH EVERY 6 HOURS AS NEEDED FOR NAUSEA FOR UP TO 7 DAYS 12/31/17  Yes [provider]  ondansetron (ZOFRAN-ODT) 4 MG disintegrating tablet Take 1 tablet (4 mg total) by mouth every 8 (eight) hours as needed for up to 14 days for nausea or vomiting. 01/09/18 01/23/18  Tresea MallGledhill, Gurjot Brisco, CNM  Prenatal Vit-Fe Fumarate-FA (PRENATAL MULTIVITAMIN) TABS tablet Take 1 tablet by mouth daily at 12  noon. Patient not taking: Reported on 02/11/2017 08/15/16   Conard Novak, MD    Physical Exam Vitals: Blood pressure 100/60, weight 190 lb (86.2 kg), last menstrual period 11/07/2017, currently breastfeeding.  General: NAD HEENT: normocephalic, anicteric Thyroid: no enlargement, no palpable nodules Pulmonary: No increased work of breathing, CTAB Cardiovascular: RRR, distal pulses 2+ Abdomen: NABS, soft, non-tender, non-distended.  Umbilicus without lesions.  No hepatomegaly, splenomegaly or masses palpable. No evidence of hernia  Genitourinary: deferred for recent PAP smear, no concerns, dating today Extremities: no edema, erythema, or tenderness Neurologic: Grossly intact Psychiatric: mood appropriate, affect full  Dating scan today:  Patient Name: Levonia Wolfley DOB: 1991-05-16 MRN: [id number]   ULTRASOUND REPORT  Location: [facility] Date of Service: 01/09/2018   Indications:dating Findings:  Mason Jim intrauterine pregnancy is visualized with a CRL consistent with [redacted]w[redacted]d gestation, giving an (U/S) EDD of 08/25/2018. The (U/S) EDD is not consistent with the clinically established EDD of 08/17/2018.  FHR: 139 BPM CRL measurement: 11.8 mm Yolk sac is visualized and appears normal and early anatomy is normal. Amnion: visualized and appears normal   Right Ovary is normal in appearance. Left Ovary is normal appearance. Corpus luteal cyst:  is not visualized Survey of  the adnexa demonstrates no adnexal masses. There is no free peritoneal fluid in the cul de sac.  Impression: 1. [redacted]w[redacted]d Viable Singleton Intrauterine pregnancy by U/S. 2. (U/S) EDD is 08/25/2018.   Assessment: 27 y.o. [id number] at [redacted]w[redacted]d presenting to initiate prenatal care  Plan: 1) Avoid alcoholic beverages. 2) Patient encouraged not to smoke.  3) Discontinue the use of all non-medicinal drugs and chemicals.  4) Take prenatal vitamins daily.  5) Nutrition, food safety (fish, cheese advisories, and high nitrite foods) and exercise discussed. 6) Hospital and practice style discussed with cross coverage system.  7) Genetic Screening, such as with 1st Trimester Screening, cell free fetal DNA, AFP testing, and Ultrasound, as well as with amniocentesis and CVS as appropriate, is discussed with patient. At the conclusion of today's visit patient requested cell free DNA genetic testing 8) Patient is asked about travel to areas at risk for the Zika virus, and counseled to avoid travel and exposure to mosquitoes or sexual partners who may have themselves been exposed to the virus. Testing is discussed, and will be ordered as appropriate.  9) Return in 3 weeks for ROB and lab work (NOB panel/Hgb A1C/MaterniT 21)   Tresea Mall, CNM [facility], [facility] 01/09/2018, 1:25 PM

## 2018-01-10 LAB — URINE DRUG PANEL 7
Amphetamines, Urine: NEGATIVE ng/mL
BENZODIAZEPINE QUANT UR: NEGATIVE ng/mL
Barbiturate Quant, Ur: NEGATIVE ng/mL
CANNABINOID QUANT UR: NEGATIVE ng/mL
COCAINE (METAB.): NEGATIVE ng/mL
Opiate Quant, Ur: NEGATIVE ng/mL
PCP QUANT UR: NEGATIVE ng/mL

## 2018-01-11 LAB — URINE CULTURE

## 2018-01-12 LAB — CHLAMYDIA/GONOCOCCUS/TRICHOMONAS, NAA
Chlamydia by NAA: NEGATIVE
Gonococcus by NAA: NEGATIVE
Trich vag by NAA: NEGATIVE

## 2018-01-30 ENCOUNTER — Encounter: Payer: Self-pay | Admitting: Certified Nurse Midwife

## 2018-01-30 ENCOUNTER — Ambulatory Visit (INDEPENDENT_AMBULATORY_CARE_PROVIDER_SITE_OTHER): Payer: Medicaid Other | Admitting: Certified Nurse Midwife

## 2018-01-30 VITALS — BP 102/72 | Wt 180.0 lb

## 2018-01-30 DIAGNOSIS — O0990 Supervision of high risk pregnancy, unspecified, unspecified trimester: Secondary | ICD-10-CM

## 2018-01-30 DIAGNOSIS — O09299 Supervision of pregnancy with other poor reproductive or obstetric history, unspecified trimester: Secondary | ICD-10-CM

## 2018-01-30 DIAGNOSIS — Z3A10 10 weeks gestation of pregnancy: Secondary | ICD-10-CM

## 2018-01-30 DIAGNOSIS — O210 Mild hyperemesis gravidarum: Secondary | ICD-10-CM

## 2018-01-30 DIAGNOSIS — O09291 Supervision of pregnancy with other poor reproductive or obstetric history, first trimester: Secondary | ICD-10-CM

## 2018-01-30 DIAGNOSIS — Z1379 Encounter for other screening for genetic and chromosomal anomalies: Secondary | ICD-10-CM

## 2018-01-30 DIAGNOSIS — Z8632 Personal history of gestational diabetes: Secondary | ICD-10-CM

## 2018-01-30 NOTE — Progress Notes (Signed)
Pt c/o nausea and vomiting. Unable to take PNV or bonjesta.

## 2018-01-31 DIAGNOSIS — O210 Mild hyperemesis gravidarum: Secondary | ICD-10-CM | POA: Insufficient documentation

## 2018-01-31 DIAGNOSIS — O09299 Supervision of pregnancy with other poor reproductive or obstetric history, unspecified trimester: Secondary | ICD-10-CM | POA: Insufficient documentation

## 2018-01-31 DIAGNOSIS — Z8632 Personal history of gestational diabetes: Secondary | ICD-10-CM

## 2018-01-31 LAB — RPR+RH+ABO+RUB AB+AB SCR+CB...
Antibody Screen: NEGATIVE
HEMOGLOBIN: 13.5 g/dL (ref 11.1–15.9)
HEP B S AG: NEGATIVE
HIV Screen 4th Generation wRfx: NONREACTIVE
Hematocrit: 41.2 % (ref 34.0–46.6)
MCH: 29.3 pg (ref 26.6–33.0)
MCHC: 32.8 g/dL (ref 31.5–35.7)
MCV: 89 fL (ref 79–97)
Platelets: 278 10*3/uL (ref 150–450)
RBC: 4.61 x10E6/uL (ref 3.77–5.28)
RDW: 13.1 % (ref 12.3–15.4)
RPR Ser Ql: NONREACTIVE
RUBELLA: 6.03 {index} (ref >0.99)
Rh Factor: POSITIVE
VARICELLA: 2267 {index} (ref >165)
WBC: 7.2 10*3/uL (ref 3.4–10.8)

## 2018-01-31 LAB — COMPREHENSIVE METABOLIC PANEL
A/G RATIO: 1.6 (ref 1.2–2.2)
ALT: 18 IU/L (ref 0–32)
AST: 16 IU/L (ref 0–40)
Albumin: 4.6 g/dL (ref 3.5–5.5)
Alkaline Phosphatase: 60 IU/L (ref 39–117)
BUN/Creatinine Ratio: 8 — ABNORMAL LOW (ref 9–23)
BUN: 5 mg/dL — ABNORMAL LOW (ref 6–20)
Bilirubin Total: 0.8 mg/dL (ref 0.0–1.2)
CALCIUM: 9.5 mg/dL (ref 8.7–10.2)
CHLORIDE: 99 mmol/L (ref 96–106)
CO2: 21 mmol/L (ref 20–29)
Creatinine, Ser: 0.65 mg/dL (ref 0.57–1.00)
GFR calc Af Amer: 143 mL/min/{1.73_m2} (ref >59)
GFR, EST NON AFRICAN AMERICAN: 124 mL/min/{1.73_m2} (ref >59)
GLOBULIN, TOTAL: 2.8 g/dL (ref 1.5–4.5)
Glucose: 82 mg/dL (ref 65–99)
Potassium: 3.7 mmol/L (ref 3.5–5.2)
Sodium: 137 mmol/L (ref 134–144)
TOTAL PROTEIN: 7.4 g/dL (ref 6.0–8.5)

## 2018-01-31 LAB — HGB A1C W/O EAG: Hgb A1c MFr Bld: 4.9 % (ref 4.8–5.6)

## 2018-01-31 NOTE — Progress Notes (Signed)
HROB at 10wk3days: Hyperemesis with all pregnancies. Was using Zofran every 8 hours, but has decreased it to 1-2 x Keeping down some foods and water. Feels full fast and the feels nauseous if she tries to eat more. Has lost 10# over the last 3 weeks.  Has a history of GDM and has been checking some blood sugars at home. Reports fasting levels in the 70s and 2 hour pp levels are 110 or below.  Wants BTL-advised to sign Medicaid papers after [redacted] weeks gestation Wants Materniti 21 test. Will get today with CMP, NOB labs, and hemoglobin A1C RTO 2 weeks

## 2018-02-06 ENCOUNTER — Telehealth: Payer: Self-pay | Admitting: Certified Nurse Midwife

## 2018-02-06 LAB — MATERNIT 21 PLUS CORE, BLOOD
Chromosome 13: NEGATIVE
Chromosome 18: NEGATIVE
Chromosome 21: NEGATIVE
Y Chromosome: DETECTED

## 2018-02-06 NOTE — Telephone Encounter (Signed)
No results as of today. Results are pending

## 2018-02-06 NOTE — Telephone Encounter (Signed)
[facility] is checking on results.

## 2018-02-06 NOTE — Telephone Encounter (Signed)
Patient is waiting on Maternai21 test results.s  Please call and give her update on when the results should be available.

## 2018-02-09 ENCOUNTER — Telehealth: Payer: Self-pay

## 2018-02-09 NOTE — Telephone Encounter (Signed)
Pt calling for lab results.

## 2018-02-09 NOTE — Telephone Encounter (Signed)
Anna Flores called with results of labs. Materniti 21 was diploid XY. She just picked up results of the sex of the baby that was placed in an envelope. The CMP and NOB labs were normal. Farrel Connersolleen Arlind Klingerman, CNM

## 2018-02-13 ENCOUNTER — Ambulatory Visit (INDEPENDENT_AMBULATORY_CARE_PROVIDER_SITE_OTHER): Payer: Medicaid Other | Admitting: Obstetrics and Gynecology

## 2018-02-13 ENCOUNTER — Encounter: Payer: Self-pay | Admitting: Obstetrics and Gynecology

## 2018-02-13 VITALS — BP 102/68 | Wt 178.0 lb

## 2018-02-13 DIAGNOSIS — Z3A12 12 weeks gestation of pregnancy: Secondary | ICD-10-CM

## 2018-02-13 DIAGNOSIS — O09291 Supervision of pregnancy with other poor reproductive or obstetric history, first trimester: Secondary | ICD-10-CM

## 2018-02-13 DIAGNOSIS — O99210 Obesity complicating pregnancy, unspecified trimester: Secondary | ICD-10-CM

## 2018-02-13 DIAGNOSIS — O0990 Supervision of high risk pregnancy, unspecified, unspecified trimester: Secondary | ICD-10-CM

## 2018-02-13 DIAGNOSIS — Z6833 Body mass index (BMI) 33.0-33.9, adult: Secondary | ICD-10-CM

## 2018-02-13 DIAGNOSIS — O09299 Supervision of pregnancy with other poor reproductive or obstetric history, unspecified trimester: Secondary | ICD-10-CM

## 2018-02-13 DIAGNOSIS — O210 Mild hyperemesis gravidarum: Secondary | ICD-10-CM

## 2018-02-13 DIAGNOSIS — Z8632 Personal history of gestational diabetes: Secondary | ICD-10-CM

## 2018-02-13 DIAGNOSIS — O99211 Obesity complicating pregnancy, first trimester: Secondary | ICD-10-CM

## 2018-02-13 NOTE — Progress Notes (Signed)
  Routine Prenatal Care Visit  Subjective  Anna Flores is a 27 y.o. [phone] at 733w3d being seen today for ongoing prenatal care.  She is currently monitored for the following issues for this high-risk pregnancy and has Supervision of high risk pregnancy, antepartum; Obesity affecting pregnancy, antepartum; History of gestational diabetes in prior pregnancy, currently pregnant; Hyperemesis affecting pregnancy, antepartum; and BMI 33.0-33.9,adult on their problem list.  ----------------------------------------------------------------------------------- Patient reports no complaints.  Nausea greatly improved.   . Vag. Bleeding: None.   . Denies leaking of fluid.  ----------------------------------------------------------------------------------- The following portions of the patient's history were reviewed and updated as appropriate: allergies, current medications, past family history, past medical history, past social history, past surgical history and problem list. Problem list updated.   Objective  Blood pressure 102/68, weight 178 lb (80.7 kg), last menstrual period 11/07/2017, currently breastfeeding. Pregravid weight 192 lb (87.1 kg) Total Weight Gain -14 lb (-6.35 kg) Urinalysis: Urine Protein: Trace Urine Glucose: Negative  Fetal Status: Fetal Heart Rate (bpm): present         General:  Alert, oriented and cooperative. Patient is in no acute distress.  Skin: Skin is warm and dry. No rash noted.   Cardiovascular: Normal heart rate noted  Respiratory: Normal respiratory effort, no problems with respiration noted  Abdomen: Soft, gravid, appropriate for gestational age.       Pelvic:  Cervical exam deferred        Extremities: Normal range of motion.     Mental Status: Normal mood and affect. Normal behavior. Normal judgment and thought content.   Assessment   27 y.o. [id number] at 383w3d by  08/25/2018, by Ultrasound presenting for routine prenatal visit  Plan   Pregnancy #4 Problems  (from 01/09/18 to present)    Problem Noted Resolved   BMI 33.0-33.9,adult 02/13/2018 by Conard NovakJackson, Shareen Capwell D, MD No       Preterm labor symptoms and general obstetric precautions including but not limited to vaginal bleeding, contractions, leaking of fluid and fetal movement were reviewed in detail with the patient. Please refer to After Visit Summary for other counseling recommendations.   Return in about 1 month (around 03/13/2018) for Routine Prenatal Appointment.  Thomasene MohairStephen Eltha Tingley, MD, Merlinda FrederickFACOG [facility], [facility] 02/13/2018 10:57 AM

## 2018-02-13 NOTE — Progress Notes (Incomplete)
  Routine Prenatal Care Visit  Subjective  Anna Flores is a 27 y.o. [id number] at 4056w3d being seen today for ongoing prenatal care.  She is currently monitored for the following issues for this {Blank single:19197::"high-risk","low-risk"} pregnancy and has Supervision of high risk pregnancy, antepartum; Obesity affecting pregnancy, antepartum; History of gestational diabetes in prior pregnancy, currently pregnant; Hyperemesis affecting pregnancy, antepartum; and BMI 33.0-33.9,adult on their problem list.  ----------------------------------------------------------------------------------- Patient reports {sx:[id number]}.    . Vag. Bleeding: None.   . Denies leaking of fluid.  ----------------------------------------------------------------------------------- The following portions of the patient's history were reviewed and updated as appropriate: allergies, current medications, past family history, past medical history, past social history, past surgical history and problem list. Problem list updated.   Objective  Blood pressure 102/68, weight 178 lb (80.7 kg), last menstrual period 11/07/2017, currently breastfeeding. Pregravid weight 192 lb (87.1 kg) Total Weight Gain -14 lb (-6.35 kg) Urinalysis: Urine Protein: Trace Urine Glucose: Negative  Fetal Status: Fetal Heart Rate (bpm): present         General:  Alert, oriented and cooperative. Patient is in no acute distress.  Skin: Skin is warm and dry. No rash noted.   Cardiovascular: Normal heart rate noted  Respiratory: Normal respiratory effort, no problems with respiration noted  Abdomen: Soft, gravid, appropriate for gestational age.       Pelvic:  {Blank single:19197::"Cervical exam performed","Cervical exam deferred"}        Extremities: Normal range of motion.     Mental Status: Normal mood and affect. Normal behavior. Normal judgment and thought content.   Assessment   27 y.o. [id number] at 5856w3d by  08/25/2018, by Ultrasound presenting for  {Blank single:19197::"routine","work-in"} prenatal visit  Plan   Pregnancy #4 Problems (from 01/09/18 to present)    Problem Noted Resolved   BMI 33.0-33.9,adult 02/13/2018 by Conard NovakJackson, Jhoselin Crume D, MD No       {Blank single:19197::"Term","Preterm"} labor symptoms and general obstetric precautions including but not limited to vaginal bleeding, contractions, leaking of fluid and fetal movement were reviewed in detail with the patient. Please refer to After Visit Summary for other counseling recommendations.   Return in about 1 month (around 03/13/2018) for Routine Prenatal Appointment.  Thomasene MohairStephen Genie Wenke, MD, Merlinda FrederickFACOG [facility], [facility] 02/13/2018 10:57 AM

## 2018-02-23 ENCOUNTER — Encounter: Payer: Self-pay | Admitting: Emergency Medicine

## 2018-02-23 ENCOUNTER — Telehealth: Payer: Self-pay

## 2018-02-23 ENCOUNTER — Emergency Department
Admission: EM | Admit: 2018-02-23 | Discharge: 2018-02-23 | Disposition: A | Discharge status: Home / Self Care | Payer: Medicaid Other | Attending: Emergency Medicine | Admitting: Emergency Medicine

## 2018-02-23 DIAGNOSIS — F329 Major depressive disorder, single episode, unspecified: Secondary | ICD-10-CM | POA: Diagnosis not present

## 2018-02-23 DIAGNOSIS — Z3A14 14 weeks gestation of pregnancy: Secondary | ICD-10-CM | POA: Diagnosis not present

## 2018-02-23 DIAGNOSIS — O99342 Other mental disorders complicating pregnancy, second trimester: Secondary | ICD-10-CM | POA: Diagnosis not present

## 2018-02-23 DIAGNOSIS — B029 Zoster without complications: Secondary | ICD-10-CM | POA: Insufficient documentation

## 2018-02-23 DIAGNOSIS — O9852 Other viral diseases complicating childbirth: Secondary | ICD-10-CM | POA: Diagnosis not present

## 2018-02-23 DIAGNOSIS — F419 Anxiety disorder, unspecified: Secondary | ICD-10-CM | POA: Insufficient documentation

## 2018-02-23 DIAGNOSIS — O99712 Diseases of the skin and subcutaneous tissue complicating pregnancy, second trimester: Secondary | ICD-10-CM | POA: Diagnosis present

## 2018-02-23 LAB — CBC
HCT: 35.7 % (ref 35.0–47.0)
Hemoglobin: 12.7 g/dL (ref 12.0–16.0)
MCH: 30.5 pg (ref 26.0–34.0)
MCHC: 35.5 g/dL (ref 32.0–36.0)
MCV: 85.8 fL (ref 80.0–100.0)
PLATELETS: 218 10*3/uL (ref 150–440)
RBC: 4.16 MIL/uL (ref 3.80–5.20)
RDW: 12.9 % (ref 11.5–14.5)
WBC: 7.7 10*3/uL (ref 3.6–11.0)

## 2018-02-23 LAB — URINALYSIS, COMPLETE (UACMP) WITH MICROSCOPIC
Bacteria, UA: NONE SEEN
Bilirubin Urine: NEGATIVE
GLUCOSE, UA: NEGATIVE mg/dL
Ketones, ur: 20 mg/dL — AB
Nitrite: NEGATIVE
Protein, ur: NEGATIVE mg/dL
Specific Gravity, Urine: 1.018 (ref 1.005–1.030)
pH: 6 (ref 5.0–8.0)

## 2018-02-23 LAB — BASIC METABOLIC PANEL
Anion gap: 6 (ref 5–15)
CO2: 26 mmol/L (ref 22–32)
Calcium: 9.2 mg/dL (ref 8.9–10.3)
Chloride: 105 mmol/L (ref 98–111)
Creatinine, Ser: 0.52 mg/dL (ref 0.44–1.00)
GFR calc Af Amer: >60 mL/min (ref >60)
GFR calc non Af Amer: >60 mL/min (ref >60)
Glucose, Bld: 85 mg/dL (ref 70–99)
Potassium: 3.1 mmol/L — ABNORMAL LOW (ref 3.5–5.1)
Sodium: 137 mmol/L (ref 135–145)

## 2018-02-23 MED ORDER — OXYCODONE HCL 5 MG PO TABS: 5.0000 mg | ORAL_TABLET | Freq: Four times a day (QID) | ORAL | 0 refills | Status: DC | PRN

## 2018-02-23 MED ORDER — VALACYCLOVIR HCL 1 G PO TABS: 2000.0000 mg | ORAL_TABLET | Freq: Three times a day (TID) | ORAL | 0 refills | Status: AC

## 2018-02-23 MED ORDER — SODIUM CHLORIDE 0.9 % IV BOLUS
1000.0000 mL | Freq: Once | INTRAVENOUS | Status: AC
  Administered 2018-02-23: 1000 mL via INTRAVENOUS

## 2018-02-23 MED ORDER — VALACYCLOVIR HCL 500 MG PO TABS
1000.0000 mg | ORAL_TABLET | Freq: Once | ORAL | Status: AC
  Administered 2018-02-23: 1000 mg via ORAL
  Filled 2018-02-23: qty 2

## 2018-02-23 NOTE — ED Triage Notes (Signed)
Patient presents to the ED with red rashy areas on the left side of her body that began on Wednesday.  Areas appear like large hives.  Patient states rash itches and burns.  Patient is approx. [redacted] weeks pregnant.  Patient is also complaining of a headache.

## 2018-02-23 NOTE — ED Notes (Signed)
This RN attempted to find fetal heart tones with doppler.  Heard heart tones for a couple of seconds but unable to hear for a sustained length of time to calculate.

## 2018-02-23 NOTE — ED Provider Notes (Signed)
[facility] Emergency Department Provider Note  ____________________________________________  Time seen: Approximately 9:11 PM  I have reviewed the triage vital signs and the nursing notes.   HISTORY  Chief Complaint Rash   HPI Anna Flores is a 27 y.o. female with a history of anemia, anxiety, depression, chickenpox, currently [redacted] weeks gestational age who presents for evaluation of a rash.  Patient reports that she first noticed the rash about a week ago.  Initially was located only on the left hip/buttock region but it also later appeared on the right scapular region.  The rash is pruritic and also painful.  She reports that it burns, the pain is worse when she touches it, the pain is constant and moderate to severe.  She denies any new medications.  She denies any changes in vision, or any rash involving her mucous membranes.  No fever or chills. She does report having dizzy spells for the last fe days where she feels like she is going to pass out. She has had HAs for the last few days but none at this time. No vaginal bleeding, dysuria, abdominal pain, cp, sob, vomiting, or diarrhea.    Past Medical History:  Diagnosis Date  . Abdominal pain 10/26/2013  . Anemia 2013  . Anxiety   . Depression   . History of Papanicolaou smear of cervix 10/02/2016   NEG  . Menometrorrhagia 10/26/2013  . Pregnancy    DELIVERED 08/12/16 - JEG    Patient Active Problem List   Diagnosis Date Noted  . BMI 33.0-33.9,adult 02/13/2018  . History of gestational diabetes in prior pregnancy, currently pregnant 01/31/2018  . Hyperemesis affecting pregnancy, antepartum 01/31/2018  . Supervision of high risk pregnancy, antepartum 01/09/2018  . Obesity affecting pregnancy, antepartum 01/09/2018    Past Surgical History:  Procedure Laterality Date  . TONSILLECTOMY      Prior to Admission medications   Medication Sig Start Date End Date Taking? Authorizing Provider    Acetaminophen (TYLENOL EXTRA STRENGTH PO) Take 1 tablet by mouth as needed.    [provider]  Doxylamine-Pyridoxine ER (BONJESTA) 20-20 MG TBCR Take by mouth.    [provider]  ondansetron (ZOFRAN-ODT) 4 MG disintegrating tablet Take 4 mg by mouth every 8 (eight) hours as needed for nausea or vomiting.    [provider]  oxyCODONE (ROXICODONE) 5 MG immediate release tablet Take 1 tablet (5 mg total) by mouth every 6 (six) hours as needed for moderate pain or severe pain. 02/23/18 02/23/19  Nita Sickle, MD  Prenatal Vit-Fe Fumarate-FA (PRENATAL MULTIVITAMIN) TABS tablet Take 1 tablet by mouth daily at 12 noon. Patient not taking: Reported on 01/30/2018 08/15/16   Conard Novak, MD  promethazine (PHENERGAN) 25 MG tablet TAKE 1 TABLET BY MOUTH EVERY 6 HOURS AS NEEDED FOR NAUSEA FOR UP TO 7 DAYS 12/31/17   [provider]  valACYclovir (VALTREX) 1000 MG tablet Take 2 tablets (2,000 mg total) by mouth 3 (three) times daily for 10 days. 02/23/18 03/05/18  Nita Sickle, MD    Allergies Amoxicillin and Cherry  Family History  Adopted: Yes  Problem Relation Age of Onset  . Nevi Sister        DISPLASTIC NEVUS    Social History Social History   Tobacco Use  . Smoking status: Never Smoker  . Smokeless tobacco: Never Used  Substance Use Topics  . Alcohol use: No  . Drug use: No    Review of Systems  Constitutional: Negative  for fever. + Lightheadedness Eyes: Negative for visual changes. ENT: Negative for sore throat. Neck: No neck pain  Cardiovascular: Negative for chest pain. Respiratory: Negative for shortness of breath. Gastrointestinal: Negative for abdominal pain, vomiting or diarrhea. Genitourinary: Negative for dysuria. Musculoskeletal: Negative for back pain. Skin: + rash. Neurological: + headaches. No weakness or numbness. Psych: No SI or HI  ____________________________________________   PHYSICAL EXAM:  VITAL  SIGNS: ED Triage Vitals  Enc Vitals Group     BP 02/23/18 1801 125/74     Pulse Rate 02/23/18 1801 89     Resp 02/23/18 1801 18     Temp 02/23/18 1801 98.8 F (37.1 C)     Temp Source 02/23/18 1801 Oral     SpO2 02/23/18 1801 96 %     Weight 02/23/18 1804 179 lb (81.2 kg)     Height 02/23/18 1804 5\' 4"  (1.626 m)     Head Circumference --      Peak Flow --      Pain Score 02/23/18 1804 7     Pain Loc --      Pain Edu? --      Excl. in GC? --     Constitutional: Alert and oriented. Well appearing and in no apparent distress. HEENT:      Head: Normocephalic and atraumatic.         Eyes: Conjunctivae are normal. Sclera is non-icteric.       Mouth/Throat: Mucous membranes are moist.       Neck: Supple with no signs of meningismus. Cardiovascular: Regular rate and rhythm. No murmurs, gallops, or rubs. 2+ symmetrical distal pulses are present in all extremities. No JVD. Respiratory: Normal respiratory effort. Lungs are clear to auscultation bilaterally. No wheezes, crackles, or rhonchi.  Gastrointestinal: Soft, non tender, and non distended with positive bowel sounds. No rebound or guarding. Musculoskeletal: Nontender with normal range of motion in all extremities. No edema, cyanosis, or erythema of extremities. Neurologic: Normal speech and language. Face is symmetric. Moving all extremities. No gross focal neurologic deficits are appreciated. Skin: There is a maculopapular erythematous blanching rash with vesicles on the L hip/ buttock region and R scapula region Psychiatric: Mood and affect are normal. Speech and behavior are normal.    RIGHT SCAPULA REGION    LEFT BUTTOCK REGION    ____________________________________________   LABS (all labs ordered are listed, but only abnormal results are displayed)  Labs Reviewed  BASIC METABOLIC PANEL - Abnormal; Notable for the following components:      Result Value   Potassium 3.1 (*)    BUN <5 (*)    All other components  within normal limits  URINALYSIS, COMPLETE (UACMP) WITH MICROSCOPIC - Abnormal; Notable for the following components:   Color, Urine YELLOW (*)    APPearance CLEAR (*)    Hgb urine dipstick SMALL (*)    Ketones, ur 20 (*)    Leukocytes, UA TRACE (*)    All other components within normal limits  CBC   ____________________________________________  EKG  NONE ____________________________________________  RADIOLOGY  NONE ____________________________________________   PROCEDURES  Procedure(s) performed: None Procedures Critical Care performed:  None ____________________________________________   INITIAL IMPRESSION / ASSESSMENT AND PLAN / ED COURSE   27 y.o. female with a history of anemia, anxiety, depression, chickenpox, currently [redacted] weeks gestational age who presents for evaluation of a pruritic painful rash x 1 week.  Patient is well-appearing, no distress, she has normal vital signs, her rash looks like shingles  especially since she has had chickenpox as a child.  It is odd that it includes 2 different dermatomes however since patient is pregnant she is considered somewhat immune suppressed which could lead to a more atypical presentation.  I discussed with Dr. Tiburcio PeaHarris from patient's OB/GYN clinic who agrees with starting patient on Valtrex and he will see her in clinic within the next week.  I have also try to contact Dr. Gwen PoundsKowalski, dermatology for consultation however we do not have a dermatologist consulted at this time.  I have referred patient to him for close f/u in case patient's rash is from a different etiology and does not respond to treatment.  There is no evidence of Stevens-Johnson's.  No evidence of tickborne illness.  Bedside ultrasound showing great fetal activity with fetal heart rate of 141.  UA showing ketones concerning for mild dehydration which is probably contributing to patient's headache and dizzy spells.  There are no signs of or symptoms of preeclampsia with  normal blood pressure and no proteinuria.  Patient was given IV fluids with improvement of her symptoms.  Patient was given a dose of Valtrex here and is going to be discharged home at this time.  Discussed return precautions for fever, changes in vision, mucosal involvement.      As part of my medical decision making, I reviewed the following data within the electronic MEDICAL RECORD NUMBER Nursing notes reviewed and incorporated, Labs reviewed , Old chart reviewed, A consult was requested and obtained from this/these consultant(s) ObGYN, Notes from prior ED visits and [place] Controlled Substance Database    Pertinent labs & imaging results that were available during my care of the patient were reviewed by me and considered in my medical decision making (see chart for details).    ____________________________________________   FINAL CLINICAL IMPRESSION(S) / ED DIAGNOSES  Final diagnoses:  Herpes zoster without complication      NEW MEDICATIONS STARTED DURING THIS VISIT:  ED Discharge Orders        Ordered    valACYclovir (VALTREX) 1000 MG tablet  3 times daily     02/23/18 2128    oxyCODONE (ROXICODONE) 5 MG immediate release tablet  Every 6 hours PRN     02/23/18 2128       Note:  This document was prepared using Dragon voice recognition software and may include unintentional dictation errors.    Don PerkingVeronese, [place], MD 02/23/18 2128

## 2018-02-23 NOTE — Telephone Encounter (Signed)
Pt states she had what she thought was a heat rash on her left side last Wed. [place]. Pt states rash has gotten worse (red, itchy, painful, warm to touch). She believes she has shingles (no known exposure). She gave details of rash being on right upper back also. No relief w/OTC Aveno for itching. Advised can take Benadryl & shingles is typically unilateral. Pt reports she has also been very dizzy with white spots in vision for a few days. Inquired about hydration. Pt states she feels like she is well hydrated. Cb#[phone]

## 2018-02-23 NOTE — Telephone Encounter (Signed)
She should probably be seen, if she is pregnant.

## 2018-02-23 NOTE — ED Notes (Signed)
Pt c/o rash on her left side that started last Wednesday that is itchy and painful. Pt states she has been dizzy and having multiple H/A for the past week. Pt states she is also [redacted] wks pregnant.

## 2018-02-24 ENCOUNTER — Telehealth: Payer: Self-pay

## 2018-02-24 NOTE — Telephone Encounter (Signed)
Spoke to pt. She had received a call from after hours nurse & went to ER last night. Was dx w/Shingles & given valcyclovir. Needs apt for 1 wk for f/u. [facility].

## 2018-02-24 NOTE — Telephone Encounter (Signed)
Called pt back,  She left a  message  with after hour nurse line.

## 2018-02-27 ENCOUNTER — Encounter: Payer: Self-pay | Admitting: Obstetrics and Gynecology

## 2018-02-27 ENCOUNTER — Ambulatory Visit (INDEPENDENT_AMBULATORY_CARE_PROVIDER_SITE_OTHER): Payer: Medicaid Other | Admitting: Obstetrics and Gynecology

## 2018-02-27 VITALS — BP 102/62 | HR 93 | Wt 177.0 lb

## 2018-02-27 DIAGNOSIS — O210 Mild hyperemesis gravidarum: Secondary | ICD-10-CM

## 2018-02-27 DIAGNOSIS — O09292 Supervision of pregnancy with other poor reproductive or obstetric history, second trimester: Secondary | ICD-10-CM

## 2018-02-27 DIAGNOSIS — Z3A14 14 weeks gestation of pregnancy: Secondary | ICD-10-CM

## 2018-02-27 DIAGNOSIS — B029 Zoster without complications: Secondary | ICD-10-CM | POA: Insufficient documentation

## 2018-02-27 DIAGNOSIS — O0990 Supervision of high risk pregnancy, unspecified, unspecified trimester: Secondary | ICD-10-CM

## 2018-02-27 DIAGNOSIS — O99210 Obesity complicating pregnancy, unspecified trimester: Secondary | ICD-10-CM

## 2018-02-27 DIAGNOSIS — Z6833 Body mass index (BMI) 33.0-33.9, adult: Secondary | ICD-10-CM

## 2018-02-27 DIAGNOSIS — Z8632 Personal history of gestational diabetes: Secondary | ICD-10-CM

## 2018-02-27 DIAGNOSIS — O99212 Obesity complicating pregnancy, second trimester: Secondary | ICD-10-CM

## 2018-02-27 DIAGNOSIS — O09299 Supervision of pregnancy with other poor reproductive or obstetric history, unspecified trimester: Secondary | ICD-10-CM

## 2018-02-27 MED ORDER — ACYCLOVIR 400 MG PO TABS: 800.0000 mg | ORAL_TABLET | Freq: Every day | ORAL | 0 refills | Status: AC

## 2018-02-27 MED ORDER — OXYCODONE-ACETAMINOPHEN 5-325 MG PO TABS: 1.0000 | ORAL_TABLET | Freq: Four times a day (QID) | ORAL | 0 refills | Status: DC | PRN

## 2018-02-27 NOTE — Progress Notes (Incomplete)
  Routine Prenatal Care Visit  Subjective  Anna Flores is a 27 y.o. [phone] at 3675w3d being seen today for ongoing prenatal care.  She is currently monitored for the following issues for this {Blank single:19197::"high-risk","low-risk"} pregnancy and has Supervision of high risk pregnancy, antepartum; Obesity affecting pregnancy, antepartum; History of gestational diabetes in prior pregnancy, currently pregnant; Hyperemesis affecting pregnancy, antepartum; BMI 33.0-33.9,adult; and Shingles on their problem list.  ----------------------------------------------------------------------------------- Patient reports {sx:14538}.    . Vag. Bleeding: None.  Movement: Absent. Denies leaking of fluid.  ----------------------------------------------------------------------------------- The following portions of the patient's history were reviewed and updated as appropriate: allergies, current medications, past family history, past medical history, past social history, past surgical history and problem list. Problem list updated.   Objective  Blood pressure 102/62, pulse 93, weight 177 lb (80.3 kg), last menstrual period 11/07/2017, currently breastfeeding. Pregravid weight 192 lb (87.1 kg) Total Weight Gain -15 lb (-6.804 kg) Urinalysis:      Fetal Status: Fetal Heart Rate (bpm): 140   Movement: Absent     General:  Alert, oriented and cooperative. Patient is in no acute distress.  Skin: Skin is warm and dry. No rash noted.   Cardiovascular: Normal heart rate noted  Respiratory: Normal respiratory effort, no problems with respiration noted  Abdomen: Soft, gravid, appropriate for gestational age.       Pelvic:  {Blank single:19197::"Cervical exam performed","Cervical exam deferred"}        Extremities: Normal range of motion.     Mental Status: Normal mood and affect. Normal behavior. Normal judgment and thought content.   Assessment   27 y.o. [id number] at 2975w3d by  08/25/2018, by Ultrasound presenting  for {Blank single:19197::"routine","work-in"} prenatal visit  Plan   Pregnancy #4 Problems (from 01/09/18 to present)    Problem Noted Resolved   Shingles 02/27/2018 by Conard NovakJackson, Stephen D, MD No   BMI 33.0-33.9,adult 02/13/2018 by Conard NovakJackson, Stephen D, MD No       {Blank single:19197::"Term","Preterm"} labor symptoms and general obstetric precautions including but not limited to vaginal bleeding, contractions, leaking of fluid and fetal movement were reviewed in detail with the patient. Please refer to After Visit Summary for other counseling recommendations.   Return for Keep previously scheduled appointment.  Thomasene MohairStephen Jackson, MD, Merlinda FrederickFACOG [facility], [facility] 02/27/2018 4:29 PM

## 2018-02-27 NOTE — Progress Notes (Signed)
Routine Prenatal Care Visit  Subjective  Anna Flores is a 27 y.o. [phone] at [redacted]w[redacted]d being seen today for ongoing prenatal care.  She is currently monitored for the following issues for this high-risk pregnancy and has Supervision of high risk pregnancy, antepartum; Obesity affecting pregnancy, antepartum; History of gestational diabetes in prior pregnancy, currently pregnant; Hyperemesis affecting pregnancy, antepartum; BMI 33.0-33.9,adult; and Shingles on their problem list.  ----------------------------------------------------------------------------------- Patient reports skin rash that started nearly a week ago on her left hip/buttock and later another rash appeared on her right scapular region. She presented to the ER 4 days ago and was diagnosed with shingles. She was given a prescription for valtrex, which she has not started yet.  She states the lesion on her right scapular area seems to have spread toward her right breast. It itches and burns and prevents comfortable sleep.  She continues to have episodes of feeling lightheaded and dizzy. She received IV fluids in the ER and felt better. She is not vomiting now and states she is aggressively hydrating. She has no other acute complaints today.  . Vag. Bleeding: None.  Movement: Absent. Denies leaking of fluid.  ----------------------------------------------------------------------------------- The following portions of the patient's history were reviewed and updated as appropriate: allergies, current medications, past family history, past medical history, past social history, past surgical history and problem list. Problem list updated.   Objective  Blood pressure 102/62, pulse 93, weight 177 lb (80.3 kg), last menstrual period 11/07/2017, currently breastfeeding. Pregravid weight 192 lb (87.1 kg) Total Weight Gain -15 lb (-6.804 kg) Urinalysis:      Fetal Status: Fetal Heart Rate (bpm): 140   Movement: Absent     General:  Alert, oriented  and cooperative. Patient is in no acute distress.  Skin: Skin is warm and dry. Maculopapular rash along right scapular region extending anteriorly to nearly the edge of the right breast with vesicles noted.  Similar lesion on left hip/buttock.  Cardiovascular: Normal heart rate noted  Respiratory: Normal respiratory effort, no problems with respiration noted  Abdomen: Soft, gravid, appropriate for gestational age.       Pelvic:  Cervical exam deferred        Extremities: Normal range of motion.     Mental Status: Normal mood and affect. Normal behavior. Normal judgment and thought content.   Assessment   27 y.o. [id number] at [redacted]w[redacted]d by  08/25/2018, by Ultrasound presenting for work-in prenatal visit  Plan   Pregnancy #4 Problems (from 01/09/18 to present)    Problem Noted Resolved   Shingles 02/27/2018 by Conard Novak, MD No   BMI 33.0-33.9,adult 02/13/2018 by Conard Novak, MD No   History of gestational diabetes in prior pregnancy, currently pregnant 01/31/2018 by Farrel Conners, CNM No   Hyperemesis affecting pregnancy, antepartum 01/31/2018 by Farrel Conners, CNM No   Supervision of high risk pregnancy, antepartum 01/09/2018 by Tresea Mall, CNM No   Overview Addendum 02/02/2018  2:41 PM by Farrel Conners, CNM    Clinic [facility] Prenatal Labs  Dating 7 week ultrasound Blood type:   O POS  Genetic Screen 1 Screen:    AFP:     Quad:     NIPS: Antibody: negative  Anatomic [place]  Rubella:RI Varicella: VI  GTT Early:               Third trimester:  RPR: negative  Rhogam  HBsAg:negative  TDaP vaccine  Flu Shot: HIV: non reactive  Baby Food                                GBS:   Contraception  Pap:  CBB     CS/VBAC NA   Support Person Husband [name]         Obesity affecting pregnancy, antepartum 01/09/2018 by Tresea MallGledhill, [name], CNM No      Preterm labor symptoms and general obstetric precautions including but not limited to vaginal bleeding,  contractions, leaking of fluid and fetal movement were reviewed in detail with the patient. Please refer to After Visit Summary for other counseling recommendations.   - encouraged her to begin taking Valtrex to prevent spread of infection.   - percocet OK to take, prn - consider dermatology appt given odd nature of two locations of outbreak  Return for Keep previously scheduled appointment.  Thomasene MohairStephen Sumiko Ceasar, MD, Merlinda FrederickFACOG [facility], [facility] 02/27/2018 4:29 PM

## 2018-03-13 ENCOUNTER — Ambulatory Visit (INDEPENDENT_AMBULATORY_CARE_PROVIDER_SITE_OTHER): Payer: Medicaid Other | Admitting: Certified Nurse Midwife

## 2018-03-13 ENCOUNTER — Encounter: Payer: Self-pay | Admitting: Certified Nurse Midwife

## 2018-03-13 VITALS — BP 100/60 | Wt 173.0 lb

## 2018-03-13 DIAGNOSIS — O0990 Supervision of high risk pregnancy, unspecified, unspecified trimester: Secondary | ICD-10-CM

## 2018-03-13 DIAGNOSIS — R51 Headache: Secondary | ICD-10-CM

## 2018-03-13 DIAGNOSIS — Z3A16 16 weeks gestation of pregnancy: Secondary | ICD-10-CM

## 2018-03-13 DIAGNOSIS — O9989 Other specified diseases and conditions complicating pregnancy, childbirth and the puerperium: Secondary | ICD-10-CM

## 2018-03-13 DIAGNOSIS — Z363 Encounter for antenatal screening for malformations: Secondary | ICD-10-CM

## 2018-03-13 DIAGNOSIS — Z1379 Encounter for other screening for genetic and chromosomal anomalies: Secondary | ICD-10-CM

## 2018-03-13 LAB — POCT URINALYSIS DIPSTICK OB: Glucose, UA: NEGATIVE — AB

## 2018-03-13 NOTE — Progress Notes (Signed)
C/o dizziness & bad headaches since last visit. States BS has been low (80) 2hrpp

## 2018-03-13 NOTE — Progress Notes (Signed)
HROB at 16wk3d: Shingles rash is gone, still having some pain in area. Eating better, but has still lost 4# over the last 2 weeks. Continues to have lightheadedness and headaches. 500 mgm Tylenol helps with headache.  Urine dipstick sp grav 1.025 and trace ketones. Possibly feeling some flutters at night MSAFP today Anatomy scan and ROB in 3 weeks Anna Flores, CNM

## 2018-03-18 LAB — AFP, SERUM, OPEN SPINA BIFIDA
AFP MoM: 1.12
AFP VALUE AFPOSL: 34.6 ng/mL
GEST. AGE ON COLLECTION DATE: 16.3 wk
MATERNAL AGE AT EDD: 25.6 a
OSBR Risk 1 IN: 8371
Test Results:: NEGATIVE
WEIGHT: 173 [lb_av]

## 2018-04-03 ENCOUNTER — Ambulatory Visit (INDEPENDENT_AMBULATORY_CARE_PROVIDER_SITE_OTHER): Payer: Medicaid Other | Admitting: Maternal Newborn

## 2018-04-03 ENCOUNTER — Ambulatory Visit (INDEPENDENT_AMBULATORY_CARE_PROVIDER_SITE_OTHER): Payer: Medicaid Other

## 2018-04-03 ENCOUNTER — Encounter: Payer: Self-pay | Admitting: Maternal Newborn

## 2018-04-03 VITALS — BP 110/60 | Wt 175.0 lb

## 2018-04-03 DIAGNOSIS — Z363 Encounter for antenatal screening for malformations: Secondary | ICD-10-CM

## 2018-04-03 DIAGNOSIS — O0990 Supervision of high risk pregnancy, unspecified, unspecified trimester: Secondary | ICD-10-CM

## 2018-04-03 DIAGNOSIS — Z3689 Encounter for other specified antenatal screening: Secondary | ICD-10-CM

## 2018-04-03 DIAGNOSIS — Z3A19 19 weeks gestation of pregnancy: Secondary | ICD-10-CM

## 2018-04-03 NOTE — Progress Notes (Signed)
ROB and anatamoy scan- pt still having headaches, mentioned it at last visit, has been taking tylenol.

## 2018-04-03 NOTE — Progress Notes (Signed)
Routine Prenatal Care Visit  Subjective  Anna Flores is a 27 y.o. [phone] at [redacted]w[redacted]d being seen today for ongoing prenatal care.  She is currently monitored for the following issues for this high-risk pregnancy and has Supervision of high risk pregnancy, antepartum; Obesity affecting pregnancy, antepartum; History of gestational diabetes in prior pregnancy, currently pregnant; Hyperemesis affecting pregnancy, antepartum; BMI 33.0-33.9,adult; and Shingles on their problem list.  ----------------------------------------------------------------------------------- Patient reports headache.  No epigastric pain or visual changes.   Vag. Bleeding: None.  No leaking of fluid.  ----------------------------------------------------------------------------------- The following portions of the patient's history were reviewed and updated as appropriate: allergies, current medications, past family history, past medical history, past social history, past surgical history and problem list. Problem list updated.   Objective  Blood pressure 110/60, weight 175 lb (79.4 kg), last menstrual period 11/07/2017, currently breastfeeding. Pregravid weight 192 lb (87.1 kg) Total Weight Gain -17 lb (-7.711 kg) Body mass index is 30.04 kg/m.  Fetal Status: Fetal Heart Rate (bpm): 134         General:  Alert, oriented and cooperative. Patient is in no acute distress.  Skin: Skin is warm and dry. No rash noted.   Cardiovascular: Normal heart rate noted  Respiratory: Normal respiratory effort, no problems with respiration noted  Abdomen: Soft, gravid, appropriate for gestational age.       Pelvic:  Cervical exam deferred        Extremities: Normal range of motion.     Mental Status: Normal mood and affect. Normal behavior. Normal judgment and thought content.     Assessment   27 y.o. [id number] at [redacted]w[redacted]d, EDD 08/25/2018 by Ultrasound presenting for routine prenatal visit.  Plan   Pregnancy #4 Problems (from 01/09/18  to present)    Problem Noted Resolved   Shingles 02/27/2018 by Conard Novak, MD No   BMI 33.0-33.9,adult 02/13/2018 by Conard Novak, MD No   History of gestational diabetes in prior pregnancy, currently pregnant 01/31/2018 by Farrel Conners, CNM No   Hyperemesis affecting pregnancy, antepartum 01/31/2018 by Farrel Conners, CNM No   Supervision of high risk pregnancy, antepartum 01/09/2018 by Tresea Mall, CNM No   Overview Addendum 03/19/2018  8:13 PM by Farrel Conners, CNM    Clinic [facility] Prenatal Labs  Dating 7 week ultrasound Blood type:   O POS  Genetic Screen 1 Screen:    AFP:  neg   Quad:     NIPS: diploid XY Antibody: negative  Anatomic [place]  Rubella:RI Varicella: VI  GTT Early:               Third trimester:  RPR: negative  Rhogam  HBsAg:negative  TDaP vaccine                       Flu Shot: HIV: non reactive  Baby Food                                GBS:   Contraception  Pap:  CBB     CS/VBAC NA   Support Person Husband [name]           Obesity affecting pregnancy, antepartum 01/09/2018 by Tresea Mall, CNM No    Anatomy scan today normal but incomplete for cardiac views and nose/lips.  Declines Fioricet at this time. Recommended adding a caffeinated beverage with extra strength Tylenol for headaches.   Return in about 4 weeks (  around 05/01/2018) for ROB and follow up anatomy scan.  Marcelyn BruinsJacelyn Jiaire [place], CNM 04/03/2018  11:50 AM

## 2018-05-01 ENCOUNTER — Ambulatory Visit (INDEPENDENT_AMBULATORY_CARE_PROVIDER_SITE_OTHER): Payer: Medicaid Other

## 2018-05-01 ENCOUNTER — Ambulatory Visit (INDEPENDENT_AMBULATORY_CARE_PROVIDER_SITE_OTHER): Payer: Medicaid Other | Admitting: Obstetrics and Gynecology

## 2018-05-01 VITALS — BP 110/64 | Wt 177.0 lb

## 2018-05-01 DIAGNOSIS — O99210 Obesity complicating pregnancy, unspecified trimester: Secondary | ICD-10-CM

## 2018-05-01 DIAGNOSIS — Z8632 Personal history of gestational diabetes: Secondary | ICD-10-CM

## 2018-05-01 DIAGNOSIS — Z362 Encounter for other antenatal screening follow-up: Secondary | ICD-10-CM | POA: Diagnosis not present

## 2018-05-01 DIAGNOSIS — Z3689 Encounter for other specified antenatal screening: Secondary | ICD-10-CM

## 2018-05-01 DIAGNOSIS — O0990 Supervision of high risk pregnancy, unspecified, unspecified trimester: Secondary | ICD-10-CM

## 2018-05-01 DIAGNOSIS — O210 Mild hyperemesis gravidarum: Secondary | ICD-10-CM

## 2018-05-01 DIAGNOSIS — Z3A23 23 weeks gestation of pregnancy: Secondary | ICD-10-CM

## 2018-05-01 DIAGNOSIS — O09299 Supervision of pregnancy with other poor reproductive or obstetric history, unspecified trimester: Secondary | ICD-10-CM

## 2018-05-01 NOTE — Progress Notes (Signed)
Routine Prenatal Care Visit  Subjective  Anna Flores is a 27 y.o. [phone] at [redacted]w[redacted]d being seen today for ongoing prenatal care.  She is currently monitored for the following issues for this high-risk pregnancy and has Supervision of high risk pregnancy, antepartum; Obesity affecting pregnancy, antepartum; History of gestational diabetes in prior pregnancy, currently pregnant; Hyperemesis affecting pregnancy, antepartum; BMI 33.0-33.9,adult; and Shingles on their problem list.  ----------------------------------------------------------------------------------- Patient reports no complaints. Has had some intermittent braxton hicks no patterns  Contractions: Not present. Vag. Bleeding: None.  Movement: Present. Denies leaking of fluid.  ----------------------------------------------------------------------------------- The following portions of the patient's history were reviewed and updated as appropriate: allergies, current medications, past family history, past medical history, past social history, past surgical history and problem list. Problem list updated.   Objective  Blood pressure 110/64, weight 177 lb (80.3 kg), last menstrual period 11/07/2017, currently breastfeeding. Pregravid weight 192 lb (87.1 kg) Total Weight Gain -15 lb (-6.804 kg) Urinalysis:      Fetal Status: Fetal Heart Rate (bpm): `45   Movement: Present     General:  Alert, oriented and cooperative. Patient is in no acute distress.  Skin: Skin is warm and dry. No rash noted.   Cardiovascular: Normal heart rate noted  Respiratory: Normal respiratory effort, no problems with respiration noted  Abdomen: Soft, gravid, appropriate for gestational age. Pain/Pressure: Present     Pelvic:  Cervical exam deferred        Extremities: Normal range of motion.     ental Status: Normal mood and affect. Normal behavior. Normal judgment and thought content.   US Ob Comp + 14 Wk  Result Date: 04/03/2018 ULTRASOUND REPORT  Location: [facility] Date of Service: 04/03/2018 Patient Name: Anna Flores DOB: [birth year] MRN: [id number] Indications:Anatomy Ultrasound Findings: Mason Jim intrauterine pregnancy is visualized with FHR at 134 BPM. Biometrics give an (U/S) Gestational age of [redacted]w[redacted]d and an (U/S) EDD of 08/28/2018; this correlates with the clinically established Estimated Date of Delivery: 08/25/18 Fetal presentation is Cephalic. EFW: 260 gram (9oz). Placenta: Anterior, Grade 0. AFI: subjectively normal. Anatomic survey is incomplete for cardiac views and N/L due to fetal position and normal; Gender - female.  Right Ovary is normal in appearance. Left Ovary is normal appearance. Survey of the adnexa demonstrates no adnexal masses. There is no free peritoneal fluid in the cul de sac. Impression: 1. [redacted]w[redacted]d Viable Singleton Intrauterine pregnancy by U/S. 2. (U/S) EDD is consistent with Clinically established Estimated Date of Delivery: 08/25/18 . 3. Normal Anatomy Scan Recommendations: 1.Clinical correlation with the patient's History and Physical Exam. 2. Follow up anatomy. Mital bahen Leodis Binet, RDMS Review of ULTRASOUND. I have personally reviewed images and report of recent ultrasound done at [facility]. There is a singleton gestation with subjectively normal amniotic fluid volume. The fetal biometry correlates with established dating. Detailed evaluation of the fetal anatomy was performed.The fetal anatomical survey appears within normal limits within the resolution of ultrasound as described above.  It must be noted that a normal ultrasound is unable to rule out fetal aneuploidy.  Annamarie Major, MD, [facility], [facility] 04/03/2018  4:57 PM   US Ob Follow Up  Result Date: 05/01/2018 Patient Name: Anna Flores DOB: [birth year] MRN: [id number] ULTRASOUND REPORT Location: [facility] Date of Service: 05/01/2018 Indications: Anatomy follow up ultrasound Findings: Mason Jim intrauterine pregnancy is visualized with FHR  at 139 BPM. Fetal presentation is Breech. Placenta: anterior. Grade: 0 AFI: subjectively normal. Anatomic survey is now complete  for 4 chamber heart/outflow tracts and face/nose and lips. There is no free peritoneal fluid in the cul de sac. Impression: 1. [redacted]w[redacted]d Viable Singleton Intrauterine pregnancy previously established criteria. 2. Normal Anatomy Scan is now complete Recommendations: 1.Clinical correlation with the patient's History and Physical Exam. Darlina Guys, RDMS RVT There is a singleton gestation with subjectively normal amniotic fluid volume.  Limited evaluation of the fetal anatomy was performed today, focusing on on anatomic structures not fully visualized at the time of prior study.The visualized fetal anatomical survey appears within normal limits within the resolution of ultrasound as described above, and the anatomic survey is now complete.  It must be noted that a normal ultrasound is unable to rule out fetal aneuploidy.  Vena Austria, MD, [facility], [facility] 05/01/2018, 10:09 AM     Assessment   27 y.o. [id number] at [redacted]w[redacted]d by  08/25/2018, by Ultrasound presenting for routine prenatal visit  Plan   Pregnancy #4 Problems (from 01/09/18 to present)    Problem Noted Resolved   Shingles 02/27/2018 by Conard Novak, MD No   BMI 33.0-33.9,adult 02/13/2018 by Conard Novak, MD No   History of gestational diabetes in prior pregnancy, currently pregnant 01/31/2018 by Farrel Conners, CNM No   Hyperemesis affecting pregnancy, antepartum 01/31/2018 by Farrel Conners, CNM No   Supervision of high risk pregnancy, antepartum 01/09/2018 by Tresea Mall, CNM No   Overview Addendum 03/19/2018  8:13 PM by Farrel Conners, CNM    Clinic [facility] Prenatal Labs  Dating 7 week ultrasound Blood type:   O POS  Genetic Screen AFP:  neg    NIPS: diploid XY Antibody: negative  Anatomic [place] Complete Rubella:RI Varicella: VI  GTT HgbA1C 4.9%  Third trimester:   RPR: negative  Rhogam N/A HBsAg:negative  TDaP vaccine [ ]  30 weeks  Flu Shot:Declined HIV: non reactive  Baby Food                                GBS:   Contraception  Pap: 10/02/1016 NIL  CBB     CS/VBAC NA   Support Person Husband [name]           Obesity affecting pregnancy, antepartum 01/09/2018 by Tresea Mall, CNM No       Gestational age appropriate obstetric precautions including but not limited to vaginal bleeding, contractions, leaking of fluid and fetal movement were reviewed in detail with the patient.    - anatomy scan complete - Declines 1-hr at 28 weeks would like to check BG for 1 week  Return in about 4 weeks (around 05/29/2018) for ROB .  Vena Austria, MD, Evern Core [facility], [facility] 05/01/2018, 10:35 AM

## 2018-05-01 NOTE — Progress Notes (Signed)
ROB Follow up anatomy scan today Declined Flu vaccine

## 2018-05-29 ENCOUNTER — Encounter: Payer: Medicaid Other | Admitting: Obstetrics & Gynecology

## 2018-05-29 ENCOUNTER — Ambulatory Visit (INDEPENDENT_AMBULATORY_CARE_PROVIDER_SITE_OTHER): Payer: Medicaid Other | Admitting: Obstetrics and Gynecology

## 2018-05-29 VITALS — BP 112/66 | Temp 98.1°F | Wt 174.0 lb

## 2018-05-29 DIAGNOSIS — Z8632 Personal history of gestational diabetes: Secondary | ICD-10-CM

## 2018-05-29 DIAGNOSIS — O09292 Supervision of pregnancy with other poor reproductive or obstetric history, second trimester: Secondary | ICD-10-CM

## 2018-05-29 DIAGNOSIS — O98512 Other viral diseases complicating pregnancy, second trimester: Secondary | ICD-10-CM

## 2018-05-29 DIAGNOSIS — O0990 Supervision of high risk pregnancy, unspecified, unspecified trimester: Secondary | ICD-10-CM

## 2018-05-29 DIAGNOSIS — O99210 Obesity complicating pregnancy, unspecified trimester: Secondary | ICD-10-CM

## 2018-05-29 DIAGNOSIS — Z3A27 27 weeks gestation of pregnancy: Secondary | ICD-10-CM

## 2018-05-29 DIAGNOSIS — O212 Late vomiting of pregnancy: Secondary | ICD-10-CM

## 2018-05-29 DIAGNOSIS — O99212 Obesity complicating pregnancy, second trimester: Secondary | ICD-10-CM

## 2018-05-29 DIAGNOSIS — B029 Zoster without complications: Secondary | ICD-10-CM

## 2018-05-29 DIAGNOSIS — O09299 Supervision of pregnancy with other poor reproductive or obstetric history, unspecified trimester: Secondary | ICD-10-CM

## 2018-05-29 LAB — POCT URINALYSIS DIPSTICK OB
GLUCOSE, UA: NEGATIVE
POC,PROTEIN,UA: NEGATIVE

## 2018-05-29 MED ORDER — AZITHROMYCIN 250 MG PO TABS: ORAL_TABLET | ORAL | 1 refills | Status: DC

## 2018-05-29 NOTE — Progress Notes (Signed)
Routine Prenatal Care Visit  Subjective  Anna Flores is a 27 y.o. [phone] at [redacted]w[redacted]d being seen today for ongoing prenatal care.  She is currently monitored for the following issues for this low-risk pregnancy and has Supervision of high risk pregnancy, antepartum; Obesity affecting pregnancy, antepartum; History of gestational diabetes in prior pregnancy, currently pregnant; Hyperemesis affecting pregnancy, antepartum; BMI 33.0-33.9,adult; and Shingles on their problem list.  ----------------------------------------------------------------------------------- Patient reports contractions and URI.  Has had URI symptoms for past 3 days, rhinorrhea, sore throat, and cough.  The patient has had temperature to 103 yesterday evening.  Afebrile her today.  No myalgias or arthralgias.   Contractions: Irregular. Vag. Bleeding: None.  Movement: Present. Denies leaking of fluid.  ----------------------------------------------------------------------------------- The following portions of the patient's history were reviewed and updated as appropriate: allergies, current medications, past family history, past medical history, past social history, past surgical history and problem list. Problem list updated.   Objective  Blood pressure 112/66, temperature 98.1 F (36.7 C), weight 174 lb (78.9 kg), last menstrual period 11/07/2017, currently breastfeeding. Pregravid weight 192 lb (87.1 kg) Total Weight Gain -18 lb (-8.165 kg) Urinalysis:      Fetal Status: Fetal Heart Rate (bpm): 150   Movement: Present     General:  Alert, oriented and cooperative. Patient is in no acute distress.  Skin: Skin is warm and dry. No rash noted.   Cardiovascular: Normal heart rate noted  Respiratory: Normal respiratory effort, no problems with respiration noted  Abdomen: Soft, gravid, appropriate for gestational age. Pain/Pressure: Present     Pelvic:  Cervical exam performed Dilation: Closed      Extremities: Normal range  of motion.     ental Status: Normal mood and affect. Normal behavior. Normal judgment and thought content.     Assessment   27 y.o. [id number] at [redacted]w[redacted]d by  08/25/2018, by Ultrasound presenting for routine prenatal visit  Plan   Pregnancy #4 Problems (from 01/09/18 to present)    Problem Noted Resolved   Shingles 02/27/2018 by Conard Novak, MD No   BMI 33.0-33.9,adult 02/13/2018 by Conard Novak, MD No   History of gestational diabetes in prior pregnancy, currently pregnant 01/31/2018 by Farrel Conners, CNM No   Hyperemesis affecting pregnancy, antepartum 01/31/2018 by Farrel Conners, CNM No   Supervision of high risk pregnancy, antepartum 01/09/2018 by Tresea Mall, CNM No   Overview Addendum 05/01/2018 10:40 AM by Vena Austria, MD    Clinic [facility] Prenatal Labs  Dating 7 week ultrasound Blood type:   O POS  Genetic Screen AFP:  neg  NIPS: diploid XY Antibody: negative  Anatomic [place] Complete Rubella:RI Varicella: VI  GTT HgbA1C 4.9% Third trimester:  RPR: negative  Rhogam N/A HBsAg:negative  TDaP vaccine [ ]  30 weeks Flu Shot: declined HIV: non reactive  Baby Food                                GBS:   Contraception  Pap: 10/02/2016 NIL  CBB     CS/VBAC NA   Support Person Husband [name]           Obesity affecting pregnancy, antepartum 01/09/2018 by Tresea Mall, CNM No       Gestational age appropriate obstetric precautions including but not limited to vaginal bleeding, contractions, leaking of fluid and fetal movement were reviewed in detail with the patient.   - 28 week labs today, declined 1-hr  plan on turning in 1 week of BG measurements at next visit.  HgbA1c obtained today as well - azithromycin for URI    Return in about 2 weeks (around 06/12/2018) for ROB.  Vena Austria, MD, [facility], [facility] 05/29/2018, 9:38 AM

## 2018-05-29 NOTE — Progress Notes (Signed)
ROB CTX Left Sciatica pain Fever Sinus issues/nasal drainage

## 2018-05-30 LAB — CBC
Hematocrit: 34.4 % (ref 34.0–46.6)
Hemoglobin: 11.9 g/dL (ref 11.1–15.9)
MCH: 29.6 pg (ref 26.6–33.0)
MCHC: 34.6 g/dL (ref 31.5–35.7)
MCV: 86 fL (ref 79–97)
PLATELETS: 232 10*3/uL (ref 150–450)
RBC: 4.02 x10E6/uL (ref 3.77–5.28)
RDW: 13.1 % (ref 12.3–15.4)
WBC: 10.5 10*3/uL (ref 3.4–10.8)

## 2018-05-30 LAB — RPR: RPR: NONREACTIVE

## 2018-05-30 LAB — HIV ANTIBODY (ROUTINE TESTING W REFLEX): HIV SCREEN 4TH GENERATION: NONREACTIVE

## 2018-06-07 ENCOUNTER — Other Ambulatory Visit: Payer: Self-pay

## 2018-06-07 ENCOUNTER — Observation Stay
Admission: EM | Admit: 2018-06-07 | Discharge: 2018-06-07 | Disposition: A | Discharge status: Home / Self Care | Payer: Medicaid Other | Attending: Obstetrics and Gynecology | Admitting: Obstetrics and Gynecology

## 2018-06-07 ENCOUNTER — Encounter: Payer: Self-pay | Admitting: *Deleted

## 2018-06-07 ENCOUNTER — Emergency Department: Payer: Medicaid Other

## 2018-06-07 DIAGNOSIS — O26893 Other specified pregnancy related conditions, third trimester: Secondary | ICD-10-CM | POA: Diagnosis not present

## 2018-06-07 DIAGNOSIS — Z3A28 28 weeks gestation of pregnancy: Secondary | ICD-10-CM | POA: Insufficient documentation

## 2018-06-07 DIAGNOSIS — M549 Dorsalgia, unspecified: Secondary | ICD-10-CM | POA: Insufficient documentation

## 2018-06-07 DIAGNOSIS — Z041 Encounter for examination and observation following transport accident: Secondary | ICD-10-CM | POA: Diagnosis present

## 2018-06-07 DIAGNOSIS — R109 Unspecified abdominal pain: Secondary | ICD-10-CM | POA: Insufficient documentation

## 2018-06-07 DIAGNOSIS — R102 Pelvic and perineal pain: Secondary | ICD-10-CM

## 2018-06-07 DIAGNOSIS — Y92410 Unspecified street and highway as the place of occurrence of the external cause: Secondary | ICD-10-CM | POA: Diagnosis not present

## 2018-06-07 DIAGNOSIS — O9A213 Injury, poisoning and certain other consequences of external causes complicating pregnancy, third trimester: Secondary | ICD-10-CM | POA: Diagnosis not present

## 2018-06-07 DIAGNOSIS — M25559 Pain in unspecified hip: Secondary | ICD-10-CM | POA: Insufficient documentation

## 2018-06-07 DIAGNOSIS — O0990 Supervision of high risk pregnancy, unspecified, unspecified trimester: Secondary | ICD-10-CM

## 2018-06-07 DIAGNOSIS — Z3490 Encounter for supervision of normal pregnancy, unspecified, unspecified trimester: Secondary | ICD-10-CM

## 2018-06-07 DIAGNOSIS — O09299 Supervision of pregnancy with other poor reproductive or obstetric history, unspecified trimester: Secondary | ICD-10-CM

## 2018-06-07 DIAGNOSIS — Z6833 Body mass index (BMI) 33.0-33.9, adult: Secondary | ICD-10-CM

## 2018-06-07 DIAGNOSIS — Z8632 Personal history of gestational diabetes: Secondary | ICD-10-CM

## 2018-06-07 DIAGNOSIS — O99210 Obesity complicating pregnancy, unspecified trimester: Secondary | ICD-10-CM

## 2018-06-07 DIAGNOSIS — O210 Mild hyperemesis gravidarum: Secondary | ICD-10-CM

## 2018-06-07 MED ORDER — ACETAMINOPHEN 500 MG PO TABS: 1000.0000 mg | ORAL_TABLET | Freq: Three times a day (TID) | ORAL | 0 refills | Status: DC | PRN

## 2018-06-07 MED ORDER — ACETAMINOPHEN 500 MG PO TABS
1000.0000 mg | ORAL_TABLET | Freq: Once | ORAL | Status: AC
  Administered 2018-06-07: 1000 mg via ORAL
  Filled 2018-06-07: qty 2

## 2018-06-07 NOTE — ED Notes (Signed)
Unable to hear fetal heart tones in triage. PT reporting increased tenderness to the left abd when pressure was applied with doppler.
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
## 2018-06-07 NOTE — Discharge Summary (Signed)
See final progress note. 

## 2018-06-07 NOTE — Discharge Instructions (Addendum)
Please seek medical attention for any high fevers, chest pain, shortness of breath, change in behavior, persistent vomiting, bloody stool or any other new or concerning symptoms.  
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
## 2018-06-07 NOTE — ED Notes (Signed)
Pt transferred to L&D OBS 4.
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
## 2018-06-08 ENCOUNTER — Other Ambulatory Visit: Payer: Self-pay | Admitting: Obstetrics and Gynecology

## 2018-06-08 NOTE — Progress Notes (Signed)
  1 week BG values

## 2018-06-12 ENCOUNTER — Other Ambulatory Visit: Payer: Self-pay | Admitting: Obstetrics and Gynecology

## 2018-06-12 ENCOUNTER — Ambulatory Visit (INDEPENDENT_AMBULATORY_CARE_PROVIDER_SITE_OTHER): Payer: Medicaid Other | Admitting: Obstetrics and Gynecology

## 2018-06-12 ENCOUNTER — Telehealth: Payer: Self-pay

## 2018-06-12 VITALS — BP 112/62 | Wt 176.0 lb

## 2018-06-12 DIAGNOSIS — O99213 Obesity complicating pregnancy, third trimester: Secondary | ICD-10-CM

## 2018-06-12 DIAGNOSIS — O99210 Obesity complicating pregnancy, unspecified trimester: Secondary | ICD-10-CM

## 2018-06-12 DIAGNOSIS — O09299 Supervision of pregnancy with other poor reproductive or obstetric history, unspecified trimester: Secondary | ICD-10-CM

## 2018-06-12 DIAGNOSIS — O09293 Supervision of pregnancy with other poor reproductive or obstetric history, third trimester: Secondary | ICD-10-CM

## 2018-06-12 DIAGNOSIS — Z8632 Personal history of gestational diabetes: Secondary | ICD-10-CM

## 2018-06-12 DIAGNOSIS — O0990 Supervision of high risk pregnancy, unspecified, unspecified trimester: Secondary | ICD-10-CM

## 2018-06-12 DIAGNOSIS — Z3A29 29 weeks gestation of pregnancy: Secondary | ICD-10-CM

## 2018-06-12 LAB — POCT URINALYSIS DIPSTICK OB
Glucose, UA: NEGATIVE
PROTEIN: NEGATIVE

## 2018-06-12 MED ORDER — CYCLOBENZAPRINE HCL 10 MG PO TABS: 10.0000 mg | ORAL_TABLET | Freq: Three times a day (TID) | ORAL | 2 refills | Status: DC | PRN
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
## 2018-06-12 NOTE — Telephone Encounter (Signed)
Pt states [facility] did not receive rx from her visit this morning.  [phone]

## 2018-06-12 NOTE — Telephone Encounter (Signed)
AMS has previous message and will respond after seeing patients

## 2018-06-24 ENCOUNTER — Other Ambulatory Visit: Payer: Self-pay | Admitting: Obstetrics and Gynecology

## 2018-06-24 ENCOUNTER — Telehealth: Payer: Self-pay

## 2018-06-24 MED ORDER — OMEPRAZOLE 20 MG PO CPDR: 20.0000 mg | DELAYED_RELEASE_CAPSULE | Freq: Every day | ORAL | 11 refills | Status: DC

## 2018-06-24 NOTE — Telephone Encounter (Signed)
Pt requesting medication for heart burn. She saw AMS last, can you see if he will send something in for her

## 2018-06-24 NOTE — Telephone Encounter (Signed)
Pt is aware.  

## 2018-06-24 NOTE — Telephone Encounter (Signed)
Please advise. Thank you

## 2018-06-24 NOTE — Telephone Encounter (Signed)
Omeprazole called in

## 2018-06-26 ENCOUNTER — Encounter: Payer: Medicaid Other | Admitting: Certified Nurse Midwife

## 2018-07-01 ENCOUNTER — Other Ambulatory Visit: Payer: Self-pay | Admitting: Obstetrics and Gynecology

## 2018-07-01 ENCOUNTER — Ambulatory Visit (INDEPENDENT_AMBULATORY_CARE_PROVIDER_SITE_OTHER): Payer: Medicaid Other | Admitting: Advanced Practice Midwife

## 2018-07-01 ENCOUNTER — Encounter: Payer: Self-pay | Admitting: Advanced Practice Midwife

## 2018-07-01 VITALS — BP 104/68 | Wt 176.0 lb

## 2018-07-01 DIAGNOSIS — O26893 Other specified pregnancy related conditions, third trimester: Secondary | ICD-10-CM

## 2018-07-01 DIAGNOSIS — R102 Pelvic and perineal pain: Secondary | ICD-10-CM

## 2018-07-01 DIAGNOSIS — Z3A32 32 weeks gestation of pregnancy: Secondary | ICD-10-CM

## 2018-07-01 LAB — POCT URINALYSIS DIPSTICK
Bilirubin, UA: NEGATIVE
Blood, UA: NEGATIVE
GLUCOSE UA: NEGATIVE
Nitrite, UA: NEGATIVE
PROTEIN UA: POSITIVE — AB
SPEC GRAV UA: 1.02 (ref 1.010–1.025)
Urobilinogen, UA: >=8 E.U./dL — AB
pH, UA: 6 (ref 5.0–8.0)
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
## 2018-07-01 NOTE — Patient Instructions (Signed)
Third Trimester of Pregnancy The third trimester is from week 28 through week 40 (months 7 through 9). The third trimester is a time when the unborn baby (fetus) is growing rapidly. At the end of the ninth month, the fetus is about 20 inches in length and weighs 6-10 pounds. Body changes during your third trimester Your body will continue to go through many changes during pregnancy. The changes vary from woman to woman. During the third trimester:  Your weight will continue to increase. You can expect to gain 25-35 pounds (11-16 kg) by the end of the pregnancy.  You may begin to get stretch marks on your hips, abdomen, and breasts.  You may urinate more often because the fetus is moving lower into your pelvis and pressing on your bladder.  You may develop or continue to have heartburn. This is caused by increased hormones that slow down muscles in the digestive tract.  You may develop or continue to have constipation because increased hormones slow digestion and cause the muscles that push waste through your intestines to relax.  You may develop hemorrhoids. These are swollen veins (varicose veins) in the rectum that can itch or be painful.  You may develop swollen, bulging veins (varicose veins) in your legs.  You may have increased body aches in the pelvis, back, or thighs. This is due to weight gain and increased hormones that are relaxing your joints.  You may have changes in your hair. These can include thickening of your hair, rapid growth, and changes in texture. Some women also have hair loss during or after pregnancy, or hair that feels dry or thin. Your hair will most likely return to normal after your baby is born.  Your breasts will continue to grow and they will continue to become tender. A yellow fluid (colostrum) may leak from your breasts. This is the first milk you are producing for your baby.  Your belly button may stick out.  You may notice more swelling in your hands,  face, or ankles.  You may have increased tingling or numbness in your hands, arms, and legs. The skin on your belly may also feel numb.  You may feel short of breath because of your expanding uterus.  You may have more problems sleeping. This can be caused by the size of your belly, increased need to urinate, and an increase in your body's metabolism.  You may notice the fetus "dropping," or moving lower in your abdomen (lightening).  You may have increased vaginal discharge.  You may notice your joints feel loose and you may have pain around your pelvic bone.  What to expect at prenatal visits You will have prenatal exams every 2 weeks until week 36. Then you will have weekly prenatal exams. During a routine prenatal visit:  You will be weighed to make sure you and the baby are growing normally.  Your blood pressure will be taken.  Your abdomen will be measured to track your baby's growth.  The fetal heartbeat will be listened to.  Any test results from the previous visit will be discussed.  You may have a cervical check near your due date to see if your cervix has softened or thinned (effaced).  You will be tested for Group B streptococcus. This happens between 35 and 37 weeks.  Your health care provider may ask you:  What your birth plan is.  How you are feeling.  If you are feeling the baby move.  If you have had   any abnormal symptoms, such as leaking fluid, bleeding, severe headaches, or abdominal cramping.  If you are using any tobacco products, including cigarettes, chewing tobacco, and electronic cigarettes.  If you have any questions.  Other tests or screenings that may be performed during your third trimester include:  Blood tests that check for low iron levels (anemia).  Fetal testing to check the health, activity level, and growth of the fetus. Testing is done if you have certain medical conditions or if there are problems during the  pregnancy.  Nonstress test (NST). This test checks the health of your baby to make sure there are no signs of problems, such as the baby not getting enough oxygen. During this test, a belt is placed around your belly. The baby is made to move, and its heart rate is monitored during movement.  What is false labor? False labor is a condition in which you feel small, irregular tightenings of the muscles in the womb (contractions) that usually go away with rest, changing position, or drinking water. These are called Braxton Hicks contractions. Contractions may last for hours, days, or even weeks before true labor sets in. If contractions come at regular intervals, become more frequent, increase in intensity, or become painful, you should see your health care provider. What are the signs of labor?  Abdominal cramps.  Regular contractions that start at 10 minutes apart and become stronger and more frequent with time.  Contractions that start on the top of the uterus and spread down to the lower abdomen and back.  Increased pelvic pressure and dull back pain.  A watery or bloody mucus discharge that comes from the vagina.  Leaking of amniotic fluid. This is also known as your "water breaking." It could be a slow trickle or a gush. Let your health care provider know if it has a color or strange odor. If you have any of these signs, call your health care provider right away, even if it is before your due date. Follow these instructions at home: Medicines  Follow your health care provider's instructions regarding medicine use. Specific medicines may be either safe or unsafe to take during pregnancy.  Take a prenatal vitamin that contains at least 600 micrograms (mcg) of folic acid.  If you develop constipation, try taking a stool softener if your health care provider approves. Eating and drinking  Eat a balanced diet that includes fresh fruits and vegetables, whole grains, good sources of protein  such as meat, eggs, or tofu, and low-fat dairy. Your health care provider will help you determine the amount of weight gain that is right for you.  Avoid raw meat and uncooked cheese. These carry germs that can cause birth defects in the baby.  If you have low calcium intake from food, talk to your health care provider about whether you should take a daily calcium supplement.  Eat four or five small meals rather than three large meals a day.  Limit foods that are high in fat and processed sugars, such as fried and sweet foods.  To prevent constipation: ? Drink enough fluid to keep your urine clear or pale yellow. ? Eat foods that are high in fiber, such as fresh fruits and vegetables, whole grains, and beans. Activity  Exercise only as directed by your health care provider. Most women can continue their usual exercise routine during pregnancy. Try to exercise for 30 minutes at least 5 days a week. Stop exercising if you experience uterine contractions.  Avoid heavy   lifting.  Do not exercise in extreme heat or humidity, or at high altitudes.  Wear low-heel, comfortable shoes.  Practice good posture.  You may continue to have sex unless your health care provider tells you otherwise. Relieving pain and discomfort  Take frequent breaks and rest with your legs elevated if you have leg cramps or low back pain.  Take warm sitz baths to soothe any pain or discomfort caused by hemorrhoids. Use hemorrhoid cream if your health care provider approves.  Wear a good support bra to prevent discomfort from breast tenderness.  If you develop varicose veins: ? Wear support pantyhose or compression stockings as told by your healthcare provider. ? Elevate your feet for 15 minutes, 3-4 times a day. Prenatal care  Write down your questions. Take them to your prenatal visits.  Keep all your prenatal visits as told by your health care provider. This is important. Safety  Wear your seat belt at  all times when driving.  Make a list of emergency phone numbers, including numbers for family, friends, the hospital, and police and fire departments. General instructions  Avoid cat litter boxes and soil used by cats. These carry germs that can cause birth defects in the baby. If you have a cat, ask someone to clean the litter box for you.  Do not travel far distances unless it is absolutely necessary and only with the approval of your health care provider.  Do not use hot tubs, steam rooms, or saunas.  Do not drink alcohol.  Do not use any products that contain nicotine or tobacco, such as cigarettes and e-cigarettes. If you need help quitting, ask your health care provider.  Do not use any medicinal herbs or unprescribed drugs. These chemicals affect the formation and growth of the baby.  Do not douche or use tampons or scented sanitary pads.  Do not cross your legs for long periods of time.  To prepare for the arrival of your baby: ? Take prenatal classes to understand, practice, and ask questions about labor and delivery. ? Make a trial run to the hospital. ? Visit the hospital and tour the maternity area. ? Arrange for maternity or paternity leave through employers. ? Arrange for family and friends to take care of pets while you are in the hospital. ? Purchase a rear-facing car seat and make sure you know how to install it in your car. ? Pack your hospital bag. ? Prepare the baby's nursery. Make sure to remove all pillows and stuffed animals from the baby's crib to prevent suffocation.  Visit your dentist if you have not gone during your pregnancy. Use a soft toothbrush to brush your teeth and be gentle when you floss. Contact a health care provider if:  You are unsure if you are in labor or if your water has broken.  You become dizzy.  You have mild pelvic cramps, pelvic pressure, or nagging pain in your abdominal area.  You have lower back pain.  You have persistent  nausea, vomiting, or diarrhea.  You have an unusual or bad smelling vaginal discharge.  You have pain when you urinate. Get help right away if:  Your water breaks before 37 weeks.  You have regular contractions less than 5 minutes apart before 37 weeks.  You have a fever.  You are leaking fluid from your vagina.  You have spotting or bleeding from your vagina.  You have severe abdominal pain or cramping.  You have rapid weight loss or weight gain.    You have shortness of breath with chest pain.  You notice sudden or extreme swelling of your face, hands, ankles, feet, or legs.  Your baby makes fewer than 10 movements in 2 hours.  You have severe headaches that do not go away when you take medicine.  You have vision changes. Summary  The third trimester is from week 28 through week 40, months 7 through 9. The third trimester is a time when the unborn baby (fetus) is growing rapidly.  During the third trimester, your discomfort may increase as you and your baby continue to gain weight. You may have abdominal, leg, and back pain, sleeping problems, and an increased need to urinate.  During the third trimester your breasts will keep growing and they will continue to become tender. A yellow fluid (colostrum) may leak from your breasts. This is the first milk you are producing for your baby.  False labor is a condition in which you feel small, irregular tightenings of the muscles in the womb (contractions) that eventually go away. These are called Braxton Hicks contractions. Contractions may last for hours, days, or even weeks before true labor sets in.  Signs of labor can include: abdominal cramps; regular contractions that start at 10 minutes apart and become stronger and more frequent with time; watery or bloody mucus discharge that comes from the vagina; increased pelvic pressure and dull back pain; and leaking of amniotic fluid. This information is not intended to replace advice  given to you by your health care provider. Make sure you discuss any questions you have with your health care provider. Document Released: 07/16/2001 Document Revised: 12/28/2015 Document Reviewed: 09/22/2012 Elsevier Interactive Patient Education  2017 [employer]  

## 2018-07-01 NOTE — Progress Notes (Signed)
ROB C/o pelvic pressure, some braxton hicks contractions  Decline flu shot

## 2018-07-02 LAB — HEMOGLOBIN A1C
ESTIMATED AVERAGE GLUCOSE: 100 mg/dL
Hgb A1c MFr Bld: 5.1 % (ref 4.8–5.6)

## 2018-07-03 LAB — URINE CULTURE

## 2018-07-14 ENCOUNTER — Encounter: Payer: Self-pay | Admitting: Maternal Newborn

## 2018-07-14 ENCOUNTER — Ambulatory Visit (INDEPENDENT_AMBULATORY_CARE_PROVIDER_SITE_OTHER): Payer: Medicaid Other | Admitting: Maternal Newborn

## 2018-07-14 VITALS — BP 100/64 | Wt 176.5 lb

## 2018-07-14 DIAGNOSIS — Z3689 Encounter for other specified antenatal screening: Secondary | ICD-10-CM

## 2018-07-14 DIAGNOSIS — Z3A34 34 weeks gestation of pregnancy: Secondary | ICD-10-CM

## 2018-07-14 DIAGNOSIS — O09293 Supervision of pregnancy with other poor reproductive or obstetric history, third trimester: Secondary | ICD-10-CM

## 2018-07-14 DIAGNOSIS — O0990 Supervision of high risk pregnancy, unspecified, unspecified trimester: Secondary | ICD-10-CM

## 2018-07-14 LAB — POCT URINALYSIS DIPSTICK OB
Glucose, UA: NEGATIVE
PROTEIN: NEGATIVE
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
## 2018-07-14 NOTE — Patient Instructions (Signed)
Third Trimester of Pregnancy The third trimester is from week 28 through week 40 (months 7 through 9). The third trimester is a time when the unborn baby (fetus) is growing rapidly. At the end of the ninth month, the fetus is about 20 inches in length and weighs 6-10 pounds. Body changes during your third trimester Your body will continue to go through many changes during pregnancy. The changes vary from woman to woman. During the third trimester:  Your weight will continue to increase. You can expect to gain 25-35 pounds (11-16 kg) by the end of the pregnancy.  You may begin to get stretch marks on your hips, abdomen, and breasts.  You may urinate more often because the fetus is moving lower into your pelvis and pressing on your bladder.  You may develop or continue to have heartburn. This is caused by increased hormones that slow down muscles in the digestive tract.  You may develop or continue to have constipation because increased hormones slow digestion and cause the muscles that push waste through your intestines to relax.  You may develop hemorrhoids. These are swollen veins (varicose veins) in the rectum that can itch or be painful.  You may develop swollen, bulging veins (varicose veins) in your legs.  You may have increased body aches in the pelvis, back, or thighs. This is due to weight gain and increased hormones that are relaxing your joints.  You may have changes in your hair. These can include thickening of your hair, rapid growth, and changes in texture. Some women also have hair loss during or after pregnancy, or hair that feels dry or thin. Your hair will most likely return to normal after your baby is born.  Your breasts will continue to grow and they will continue to become tender. A yellow fluid (colostrum) may leak from your breasts. This is the first milk you are producing for your baby.  Your belly button may stick out.  You may notice more swelling in your hands,  face, or ankles.  You may have increased tingling or numbness in your hands, arms, and legs. The skin on your belly may also feel numb.  You may feel short of breath because of your expanding uterus.  You may have more problems sleeping. This can be caused by the size of your belly, increased need to urinate, and an increase in your body's metabolism.  You may notice the fetus "dropping," or moving lower in your abdomen (lightening).  You may have increased vaginal discharge.  You may notice your joints feel loose and you may have pain around your pelvic bone.  What to expect at prenatal visits You will have prenatal exams every 2 weeks until week 36. Then you will have weekly prenatal exams. During a routine prenatal visit:  You will be weighed to make sure you and the baby are growing normally.  Your blood pressure will be taken.  Your abdomen will be measured to track your baby's growth.  The fetal heartbeat will be listened to.  Any test results from the previous visit will be discussed.  You may have a cervical check near your due date to see if your cervix has softened or thinned (effaced).  You will be tested for Group B streptococcus. This happens between 35 and 37 weeks.  Your health care provider may ask you:  What your birth plan is.  How you are feeling.  If you are feeling the baby move.  If you have had   any abnormal symptoms, such as leaking fluid, bleeding, severe headaches, or abdominal cramping.  If you are using any tobacco products, including cigarettes, chewing tobacco, and electronic cigarettes.  If you have any questions.  Other tests or screenings that may be performed during your third trimester include:  Blood tests that check for low iron levels (anemia).  Fetal testing to check the health, activity level, and growth of the fetus. Testing is done if you have certain medical conditions or if there are problems during the  pregnancy.  Nonstress test (NST). This test checks the health of your baby to make sure there are no signs of problems, such as the baby not getting enough oxygen. During this test, a belt is placed around your belly. The baby is made to move, and its heart rate is monitored during movement.  What is false labor? False labor is a condition in which you feel small, irregular tightenings of the muscles in the womb (contractions) that usually go away with rest, changing position, or drinking water. These are called Braxton Hicks contractions. Contractions may last for hours, days, or even weeks before true labor sets in. If contractions come at regular intervals, become more frequent, increase in intensity, or become painful, you should see your health care provider. What are the signs of labor?  Abdominal cramps.  Regular contractions that start at 10 minutes apart and become stronger and more frequent with time.  Contractions that start on the top of the uterus and spread down to the lower abdomen and back.  Increased pelvic pressure and dull back pain.  A watery or bloody mucus discharge that comes from the vagina.  Leaking of amniotic fluid. This is also known as your "water breaking." It could be a slow trickle or a gush. Let your health care provider know if it has a color or strange odor. If you have any of these signs, call your health care provider right away, even if it is before your due date. Follow these instructions at home: Medicines  Follow your health care provider's instructions regarding medicine use. Specific medicines may be either safe or unsafe to take during pregnancy.  Take a prenatal vitamin that contains at least 600 micrograms (mcg) of folic acid.  If you develop constipation, try taking a stool softener if your health care provider approves. Eating and drinking  Eat a balanced diet that includes fresh fruits and vegetables, whole grains, good sources of protein  such as meat, eggs, or tofu, and low-fat dairy. Your health care provider will help you determine the amount of weight gain that is right for you.  Avoid raw meat and uncooked cheese. These carry germs that can cause birth defects in the baby.  If you have low calcium intake from food, talk to your health care provider about whether you should take a daily calcium supplement.  Eat four or five small meals rather than three large meals a day.  Limit foods that are high in fat and processed sugars, such as fried and sweet foods.  To prevent constipation: ? Drink enough fluid to keep your urine clear or pale yellow. ? Eat foods that are high in fiber, such as fresh fruits and vegetables, whole grains, and beans. Activity  Exercise only as directed by your health care provider. Most women can continue their usual exercise routine during pregnancy. Try to exercise for 30 minutes at least 5 days a week. Stop exercising if you experience uterine contractions.  Avoid heavy   lifting.  Do not exercise in extreme heat or humidity, or at high altitudes.  Wear low-heel, comfortable shoes.  Practice good posture.  You may continue to have sex unless your health care provider tells you otherwise. Relieving pain and discomfort  Take frequent breaks and rest with your legs elevated if you have leg cramps or low back pain.  Take warm sitz baths to soothe any pain or discomfort caused by hemorrhoids. Use hemorrhoid cream if your health care provider approves.  Wear a good support bra to prevent discomfort from breast tenderness.  If you develop varicose veins: ? Wear support pantyhose or compression stockings as told by your healthcare provider. ? Elevate your feet for 15 minutes, 3-4 times a day. Prenatal care  Write down your questions. Take them to your prenatal visits.  Keep all your prenatal visits as told by your health care provider. This is important. Safety  Wear your seat belt at  all times when driving.  Make a list of emergency phone numbers, including numbers for family, friends, the hospital, and police and fire departments. General instructions  Avoid cat litter boxes and soil used by cats. These carry germs that can cause birth defects in the baby. If you have a cat, ask someone to clean the litter box for you.  Do not travel far distances unless it is absolutely necessary and only with the approval of your health care provider.  Do not use hot tubs, steam rooms, or saunas.  Do not drink alcohol.  Do not use any products that contain nicotine or tobacco, such as cigarettes and e-cigarettes. If you need help quitting, ask your health care provider.  Do not use any medicinal herbs or unprescribed drugs. These chemicals affect the formation and growth of the baby.  Do not douche or use tampons or scented sanitary pads.  Do not cross your legs for long periods of time.  To prepare for the arrival of your baby: ? Take prenatal classes to understand, practice, and ask questions about labor and delivery. ? Make a trial run to the hospital. ? Visit the hospital and tour the maternity area. ? Arrange for maternity or paternity leave through employers. ? Arrange for family and friends to take care of pets while you are in the hospital. ? Purchase a rear-facing car seat and make sure you know how to install it in your car. ? Pack your hospital bag. ? Prepare the baby's nursery. Make sure to remove all pillows and stuffed animals from the baby's crib to prevent suffocation.  Visit your dentist if you have not gone during your pregnancy. Use a soft toothbrush to brush your teeth and be gentle when you floss. Contact a health care provider if:  You are unsure if you are in labor or if your water has broken.  You become dizzy.  You have mild pelvic cramps, pelvic pressure, or nagging pain in your abdominal area.  You have lower back pain.  You have persistent  nausea, vomiting, or diarrhea.  You have an unusual or bad smelling vaginal discharge.  You have pain when you urinate. Get help right away if:  Your water breaks before 37 weeks.  You have regular contractions less than 5 minutes apart before 37 weeks.  You have a fever.  You are leaking fluid from your vagina.  You have spotting or bleeding from your vagina.  You have severe abdominal pain or cramping.  You have rapid weight loss or weight gain.    You have shortness of breath with chest pain.  You notice sudden or extreme swelling of your face, hands, ankles, feet, or legs.  Your baby makes fewer than 10 movements in 2 hours.  You have severe headaches that do not go away when you take medicine.  You have vision changes. Summary  The third trimester is from week 28 through week 40, months 7 through 9. The third trimester is a time when the unborn baby (fetus) is growing rapidly.  During the third trimester, your discomfort may increase as you and your baby continue to gain weight. You may have abdominal, leg, and back pain, sleeping problems, and an increased need to urinate.  During the third trimester your breasts will keep growing and they will continue to become tender. A yellow fluid (colostrum) may leak from your breasts. This is the first milk you are producing for your baby.  False labor is a condition in which you feel small, irregular tightenings of the muscles in the womb (contractions) that eventually go away. These are called Braxton Hicks contractions. Contractions may last for hours, days, or even weeks before true labor sets in.  Signs of labor can include: abdominal cramps; regular contractions that start at 10 minutes apart and become stronger and more frequent with time; watery or bloody mucus discharge that comes from the vagina; increased pelvic pressure and dull back pain; and leaking of amniotic fluid. This information is not intended to replace advice  given to you by your health care provider. Make sure you discuss any questions you have with your health care provider. Document Released: 07/16/2001 Document Revised: 12/28/2015 Document Reviewed: 09/22/2012 Elsevier Interactive Patient Education  2017 [employer]  

## 2018-07-14 NOTE — Progress Notes (Signed)
ROB C/o dizziness, cramping, and headaches

## 2018-07-18 ENCOUNTER — Inpatient Hospital Stay
Admission: EM | Admit: 2018-07-18 | Discharge: 2018-07-18 | Disposition: A | Discharge status: Home / Self Care | Payer: Medicaid Other | Attending: Obstetrics and Gynecology | Admitting: Obstetrics and Gynecology

## 2018-07-18 ENCOUNTER — Encounter: Payer: Self-pay | Admitting: *Deleted

## 2018-07-18 ENCOUNTER — Other Ambulatory Visit: Payer: Self-pay

## 2018-07-18 DIAGNOSIS — O0990 Supervision of high risk pregnancy, unspecified, unspecified trimester: Secondary | ICD-10-CM

## 2018-07-18 DIAGNOSIS — O26893 Other specified pregnancy related conditions, third trimester: Secondary | ICD-10-CM | POA: Diagnosis not present

## 2018-07-18 DIAGNOSIS — O09299 Supervision of pregnancy with other poor reproductive or obstetric history, unspecified trimester: Secondary | ICD-10-CM

## 2018-07-18 DIAGNOSIS — R1013 Epigastric pain: Secondary | ICD-10-CM | POA: Diagnosis not present

## 2018-07-18 DIAGNOSIS — Z3A34 34 weeks gestation of pregnancy: Secondary | ICD-10-CM | POA: Diagnosis not present

## 2018-07-18 DIAGNOSIS — R109 Unspecified abdominal pain: Secondary | ICD-10-CM | POA: Diagnosis present

## 2018-07-18 DIAGNOSIS — Z6833 Body mass index (BMI) 33.0-33.9, adult: Secondary | ICD-10-CM

## 2018-07-18 DIAGNOSIS — O99210 Obesity complicating pregnancy, unspecified trimester: Secondary | ICD-10-CM

## 2018-07-18 DIAGNOSIS — O210 Mild hyperemesis gravidarum: Secondary | ICD-10-CM

## 2018-07-18 DIAGNOSIS — Z8632 Personal history of gestational diabetes: Secondary | ICD-10-CM

## 2018-07-18 MED ORDER — ACETAMINOPHEN 325 MG PO TABS: 650.0000 mg | ORAL_TABLET | ORAL | Status: DC | PRN

## 2018-07-18 NOTE — Final Progress Note (Signed)
Physician Final Progress Note  Patient ID: Anna Flores MRN: [id number] DOB/AGE: 25-23-1992 25 y.o.  Admit date: 07/18/2018 Admitting provider: Vena AustriaAndreas Regla Fitzgibbon, MD Discharge date: 07/18/2018   Admission Diagnoses: Abdominal pain  Discharge Diagnoses:  Active Problems:   * No active hospital problems. *  27 year old [id number] with epigastric pain intermittently over the past few days, always at night when sitting in bed watching TV with her husband.  Does no feel like contractions.  No LOF, no VB, +FM.  She is unable to relate the pain to food intake, perhaps related to fetal movement.  No fevers, no chills.  No nausea or emesis.  No fevers.  Suspect MSK etiology based on reproducible on exam, and related to diastasis rectus.  Blood pressure 112/69, pulse (!) 109, temperature 97.8 F (36.6 C), temperature source Oral, last menstrual period 11/07/2017, currently breastfeeding.   Consults: None  Significant Findings/ Diagnostic Studies: none  Procedures: Baseline:  NST Bseline: 130 Variability: moderate Accelerations: present Decelerations: present Tocometry: none The patient was monitored for 30 minutes, fetal heart rate tracing was deemed reactive, category I tracing,   Discharge Condition: good  Disposition: Discharge disposition: 01-Home or Self Care       Diet: Regular diet  Discharge Activity: Activity as tolerated  Discharge Instructions    Discharge activity:  No Restrictions   Complete by:  As directed    Discharge diet:  No restrictions   Complete by:  As directed    Fetal Kick Count:  Lie on our left side for one hour after a meal, and count the number of times your baby kicks.  If it is less than 5 times, get up, move around and drink some juice.  Repeat the test 30 minutes later.  If it is still less than 5 kicks in an hour, notify your doctor.   Complete by:  As directed    LABOR:  When conractions begin, you should start to time them from the beginning  of one contraction to the beginning  of the next.  When contractions are 5 - 10 minutes apart or less and have been regular for at least an hour, you should call your health care provider.   Complete by:  As directed    No sexual activity restrictions   Complete by:  As directed    Notify physician for bleeding from the vagina   Complete by:  As directed    Notify physician for blurring of vision or spots before the eyes   Complete by:  As directed    Notify physician for chills or fever   Complete by:  As directed    Notify physician for fainting spells, "black outs" or loss of consciousness   Complete by:  As directed    Notify physician for increase in vaginal discharge   Complete by:  As directed    Notify physician for leaking of fluid   Complete by:  As directed    Notify physician for pain or burning when urinating   Complete by:  As directed    Notify physician for pelvic pressure (sudden increase)   Complete by:  As directed    Notify physician for severe or continued nausea or vomiting   Complete by:  As directed    Notify physician for sudden gushing of fluid from the vagina (with or without continued leaking)   Complete by:  As directed    Notify physician for sudden, constant, or occasional abdominal pain  Complete by:  As directed    Notify physician if baby moving less than usual   Complete by:  As directed      Allergies as of 07/18/2018      Reactions   Amoxicillin Hives   Hydrocodone Shortness Of Breath, Other (See Comments)   "loss of consciousness"; "felt really weird"   Cherry Hives      Medication List    TAKE these medications   acetaminophen 500 MG tablet Commonly known as:  TYLENOL Take 2 tablets (1,000 mg total) by mouth every 8 (eight) hours as needed for moderate pain.   cyclobenzaprine 10 MG tablet Commonly known as:  FLEXERIL Take 1 tablet (10 mg total) by mouth 3 (three) times daily as needed for muscle spasms.   omeprazole 20 MG  capsule Commonly known as:  PRILOSEC Take 1 capsule (20 mg total) by mouth daily.      Follow-up Information    [facility] Follow up in 1 week(s).   Contact information: [address] [place] [address] [phone]           Total time spent taking care of this patient: 30 minutes  Signed: Vena Austria 07/18/2018, 2:17 PM

## 2018-07-18 NOTE — Discharge Summary (Signed)
Patient discharged home, discharge instructions given, patient states understanding. Patient left floor in stable condition, denies any other needs at this time. Patient to call and scheduled OB appointment for next week.

## 2018-07-18 NOTE — Discharge Instructions (Signed)
Drink plenty of water and get plenty of rest. Call to schedule follow up in office for next week or for any other concerns.

## 2018-07-18 NOTE — OB Triage Note (Signed)
Patient here for contractions ( upper abdomen and back) that started around around 745 last night, they coe and go every 5-7 mins. She has also been very dizzy with a headache. Denies bleeding or LOF.

## 2018-07-21 ENCOUNTER — Ambulatory Visit (INDEPENDENT_AMBULATORY_CARE_PROVIDER_SITE_OTHER): Payer: Medicaid Other | Admitting: Obstetrics and Gynecology

## 2018-07-21 VITALS — BP 104/62 | Wt 183.0 lb

## 2018-07-21 DIAGNOSIS — Z3A35 35 weeks gestation of pregnancy: Secondary | ICD-10-CM

## 2018-07-21 DIAGNOSIS — O09299 Supervision of pregnancy with other poor reproductive or obstetric history, unspecified trimester: Secondary | ICD-10-CM

## 2018-07-21 DIAGNOSIS — O99213 Obesity complicating pregnancy, third trimester: Secondary | ICD-10-CM

## 2018-07-21 DIAGNOSIS — Z8632 Personal history of gestational diabetes: Secondary | ICD-10-CM

## 2018-07-21 DIAGNOSIS — O99210 Obesity complicating pregnancy, unspecified trimester: Secondary | ICD-10-CM

## 2018-07-21 DIAGNOSIS — O09293 Supervision of pregnancy with other poor reproductive or obstetric history, third trimester: Secondary | ICD-10-CM

## 2018-07-21 DIAGNOSIS — O0990 Supervision of high risk pregnancy, unspecified, unspecified trimester: Secondary | ICD-10-CM

## 2018-07-21 NOTE — Progress Notes (Signed)
ROB ER follow up CTX/abdominal pain
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
## 2018-07-22 ENCOUNTER — Other Ambulatory Visit: Payer: Self-pay

## 2018-07-22 ENCOUNTER — Observation Stay: Payer: Medicaid Other

## 2018-07-22 ENCOUNTER — Telehealth: Payer: Self-pay

## 2018-07-22 ENCOUNTER — Observation Stay
Admission: EM | Admit: 2018-07-22 | Discharge: 2018-07-23 | Disposition: A | Discharge status: Home / Self Care | Payer: Medicaid Other | Attending: Certified Nurse Midwife | Admitting: Certified Nurse Midwife

## 2018-07-22 DIAGNOSIS — Z3A35 35 weeks gestation of pregnancy: Secondary | ICD-10-CM | POA: Diagnosis not present

## 2018-07-22 DIAGNOSIS — O4693 Antepartum hemorrhage, unspecified, third trimester: Secondary | ICD-10-CM | POA: Diagnosis present

## 2018-07-22 DIAGNOSIS — O99343 Other mental disorders complicating pregnancy, third trimester: Secondary | ICD-10-CM | POA: Diagnosis not present

## 2018-07-22 DIAGNOSIS — O4703 False labor before 37 completed weeks of gestation, third trimester: Secondary | ICD-10-CM | POA: Diagnosis present

## 2018-07-22 DIAGNOSIS — F419 Anxiety disorder, unspecified: Secondary | ICD-10-CM | POA: Diagnosis not present

## 2018-07-22 DIAGNOSIS — Z79899 Other long term (current) drug therapy: Secondary | ICD-10-CM | POA: Insufficient documentation

## 2018-07-22 DIAGNOSIS — F329 Major depressive disorder, single episode, unspecified: Secondary | ICD-10-CM | POA: Diagnosis not present

## 2018-07-22 LAB — URINALYSIS, COMPLETE (UACMP) WITH MICROSCOPIC
Bacteria, UA: NONE SEEN
Bilirubin Urine: NEGATIVE
Glucose, UA: NEGATIVE mg/dL
Ketones, ur: NEGATIVE mg/dL
Leukocytes, UA: NEGATIVE
Nitrite: NEGATIVE
PH: 6 (ref 5.0–8.0)
Protein, ur: NEGATIVE mg/dL
Specific Gravity, Urine: 1.011 (ref 1.005–1.030)

## 2018-07-22 MED ORDER — ACETAMINOPHEN 325 MG PO TABS: 650.0000 mg | ORAL_TABLET | Freq: Four times a day (QID) | ORAL | Status: DC | PRN

## 2018-07-22 MED ORDER — FAMOTIDINE 20 MG PO TABS: 20.0000 mg | ORAL_TABLET | Freq: Two times a day (BID) | ORAL | Status: DC | PRN

## 2018-07-22 NOTE — OB Triage Note (Signed)
Patient presented to L/D with complaints of leaking of fluid that began 1345. She also noticed few, nickel-sized dark red clots.Intermittent pelvic and back pain began at 1400 rated 5/10. Decrease in fetal movement noted. Last intercourse was 2 days ago. No pain or difficulty urinating. Vitals within normal limits. Patient stable at this time. Will continue to monitor.

## 2018-07-22 NOTE — Telephone Encounter (Signed)
Pt saw AMS yesterday; cx ck done; had vomiting this am; ~1:30 had clear fluid soaking her underwear; has dark red blood clots, back cramps, decreased FM - maybe has moved 4-5 times today - has tried coke and OJ.  [phone]  Adv clots more than likely from being ck'd yesterday.  Adv to go to L&D via ED.  Jessica notified.
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
## 2018-07-23 DIAGNOSIS — O4703 False labor before 37 completed weeks of gestation, third trimester: Secondary | ICD-10-CM | POA: Diagnosis not present

## 2018-07-23 NOTE — Progress Notes (Signed)
Pt d/c home to self care. Pt acknowledged discharge instructions and verbalized understanding of when to seek care (return of contractions, frequent or intense, LOF, decreased fetal movement, vaginal bleeding, etc). Pt advised to call or come to hospital with any concern or questions.
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
## 2018-07-31 ENCOUNTER — Ambulatory Visit (INDEPENDENT_AMBULATORY_CARE_PROVIDER_SITE_OTHER): Payer: Medicaid Other

## 2018-07-31 ENCOUNTER — Ambulatory Visit (INDEPENDENT_AMBULATORY_CARE_PROVIDER_SITE_OTHER): Payer: Medicaid Other | Admitting: Obstetrics and Gynecology

## 2018-07-31 ENCOUNTER — Other Ambulatory Visit (HOSPITAL_COMMUNITY)
Admission: RE | Admit: 2018-07-31 | Discharge: 2018-07-31 | Disposition: A | Discharge status: Home / Self Care | Payer: Medicaid Other | Source: Ambulatory Visit | Attending: Obstetrics and Gynecology | Admitting: Obstetrics and Gynecology

## 2018-07-31 ENCOUNTER — Other Ambulatory Visit: Payer: Self-pay | Admitting: Maternal Newborn

## 2018-07-31 VITALS — BP 110/80 | Wt 182.0 lb

## 2018-07-31 DIAGNOSIS — O0990 Supervision of high risk pregnancy, unspecified, unspecified trimester: Secondary | ICD-10-CM

## 2018-07-31 DIAGNOSIS — O4693 Antepartum hemorrhage, unspecified, third trimester: Secondary | ICD-10-CM | POA: Diagnosis not present

## 2018-07-31 DIAGNOSIS — O365930 Maternal care for other known or suspected poor fetal growth, third trimester, not applicable or unspecified: Secondary | ICD-10-CM | POA: Diagnosis not present

## 2018-07-31 DIAGNOSIS — Z3689 Encounter for other specified antenatal screening: Secondary | ICD-10-CM

## 2018-07-31 DIAGNOSIS — Z8632 Personal history of gestational diabetes: Secondary | ICD-10-CM

## 2018-07-31 DIAGNOSIS — O09299 Supervision of pregnancy with other poor reproductive or obstetric history, unspecified trimester: Secondary | ICD-10-CM

## 2018-07-31 DIAGNOSIS — O09293 Supervision of pregnancy with other poor reproductive or obstetric history, third trimester: Secondary | ICD-10-CM

## 2018-07-31 DIAGNOSIS — O365990 Maternal care for other known or suspected poor fetal growth, unspecified trimester, not applicable or unspecified: Secondary | ICD-10-CM

## 2018-07-31 DIAGNOSIS — O99210 Obesity complicating pregnancy, unspecified trimester: Secondary | ICD-10-CM

## 2018-07-31 DIAGNOSIS — Z3A36 36 weeks gestation of pregnancy: Secondary | ICD-10-CM | POA: Insufficient documentation

## 2018-07-31 DIAGNOSIS — O4703 False labor before 37 completed weeks of gestation, third trimester: Secondary | ICD-10-CM

## 2018-07-31 DIAGNOSIS — O210 Mild hyperemesis gravidarum: Secondary | ICD-10-CM | POA: Diagnosis not present

## 2018-07-31 DIAGNOSIS — Z3685 Encounter for antenatal screening for Streptococcus B: Secondary | ICD-10-CM

## 2018-07-31 DIAGNOSIS — Z113 Encounter for screening for infections with a predominantly sexual mode of transmission: Secondary | ICD-10-CM | POA: Diagnosis present

## 2018-07-31 DIAGNOSIS — O99213 Obesity complicating pregnancy, third trimester: Secondary | ICD-10-CM
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
## 2018-08-03 LAB — CERVICOVAGINAL ANCILLARY ONLY
Chlamydia: NEGATIVE
Neisseria Gonorrhea: NEGATIVE

## 2018-08-04 ENCOUNTER — Observation Stay
Admission: EM | Admit: 2018-08-04 | Discharge: 2018-08-04 | Disposition: A | Discharge status: Home / Self Care | Payer: Medicaid Other | Attending: Obstetrics and Gynecology | Admitting: Obstetrics and Gynecology

## 2018-08-04 ENCOUNTER — Other Ambulatory Visit: Payer: Self-pay

## 2018-08-04 ENCOUNTER — Encounter: Payer: Self-pay | Admitting: Maternal Newborn

## 2018-08-04 ENCOUNTER — Ambulatory Visit (INDEPENDENT_AMBULATORY_CARE_PROVIDER_SITE_OTHER): Payer: Medicaid Other | Admitting: Maternal Newborn

## 2018-08-04 VITALS — BP 100/60 | Wt 181.0 lb

## 2018-08-04 DIAGNOSIS — O09299 Supervision of pregnancy with other poor reproductive or obstetric history, unspecified trimester: Secondary | ICD-10-CM

## 2018-08-04 DIAGNOSIS — O368130 Decreased fetal movements, third trimester, not applicable or unspecified: Principal | ICD-10-CM | POA: Insufficient documentation

## 2018-08-04 DIAGNOSIS — O365990 Maternal care for other known or suspected poor fetal growth, unspecified trimester, not applicable or unspecified: Secondary | ICD-10-CM

## 2018-08-04 DIAGNOSIS — Z3A37 37 weeks gestation of pregnancy: Secondary | ICD-10-CM

## 2018-08-04 DIAGNOSIS — O0990 Supervision of high risk pregnancy, unspecified, unspecified trimester: Secondary | ICD-10-CM

## 2018-08-04 DIAGNOSIS — O09293 Supervision of pregnancy with other poor reproductive or obstetric history, third trimester: Secondary | ICD-10-CM | POA: Diagnosis not present

## 2018-08-04 DIAGNOSIS — O210 Mild hyperemesis gravidarum: Secondary | ICD-10-CM

## 2018-08-04 DIAGNOSIS — O99210 Obesity complicating pregnancy, unspecified trimester: Secondary | ICD-10-CM

## 2018-08-04 DIAGNOSIS — Z6833 Body mass index (BMI) 33.0-33.9, adult: Secondary | ICD-10-CM

## 2018-08-04 DIAGNOSIS — Z8632 Personal history of gestational diabetes: Secondary | ICD-10-CM

## 2018-08-04 LAB — POCT URINALYSIS DIPSTICK OB
Glucose, UA: NEGATIVE
POC,PROTEIN,UA: NEGATIVE

## 2018-08-04 LAB — FETAL NONSTRESS TEST

## 2018-08-04 LAB — STREP GP B CULTURE+RFLX: STREP GP B CULTURE+RFLX: NEGATIVE

## 2018-08-04 NOTE — OB Triage Note (Signed)
Pt presents from the office for decreased fetal movement. Also reports sharp stabbing pain on her right side that is constant. Moving makes the pain worse. Pt rates pain 6/10. Pt has ate recently. Denies any bleeding or LOF. Reports minimal fetal movement. Vitals WNL. Will continue to monitor.

## 2018-08-04 NOTE — Discharge Summary (Signed)
Physician Discharge Summary   Patient ID: Anna Flores [id number] 27 y.o. 1990/08/18  Admit date: 08/04/2018  Discharge date and time: No discharge date for patient encounter.   Admitting Physician: Natale Milchhristanna R Zakai Gonyea, MD   Discharge Physician: Adelene Idlerhristanna Tai Skelly MD  Admission Diagnoses: 37 wks preg for monitoring  Discharge Diagnoses: Same as above  Admission Condition: good  Discharged Condition: good  Indication for Admission: fetal observation  Hospital Course: Patient was admitted for further observation and prolonged monitoring related to decreased fetal movement  Consults: None  Significant Diagnostic Studies: labs: none  Treatments: none  Discharge Exam: BP 111/70   Pulse 89   Temp 98.1 F (36.7 C) (Oral)   Resp 16   Ht 5\' 4"  (1.626 m)   Wt 82.1 kg   LMP 11/07/2017   SpO2 100%   BMI 31.07 kg/m   General Appearance:    Alert, cooperative, no distress, appears stated age  Head:    Normocephalic, without obvious abnormality, atraumatic  Eyes:    PERRL, conjunctiva/corneas clear, EOM's intact, fundi    benign, both eyes  Ears:    Normal TM's and external ear canals, both ears  Nose:   Nares normal, septum midline, mucosa normal, no drainage    or sinus tenderness  Throat:   Lips, mucosa, and tongue normal; teeth and gums normal  Neck:   Supple, symmetrical, trachea midline, no adenopathy;    thyroid:  no enlargement/tenderness/nodules; no carotid   bruit or JVD  Back:     Symmetric, no curvature, ROM normal, no CVA tenderness  Lungs:     Clear to auscultation bilaterally, respirations unlabored  Chest Wall:    No tenderness or deformity   Heart:    Regular rate and rhythm, S1 and S2 normal, no murmur, rub   or gallop  Breast Exam:    No tenderness, masses, or nipple abnormality  Abdomen:     Soft, non-tender, bowel sounds active all four quadrants,    no masses, no organomegaly  Genitalia:    Normal female without lesion, discharge or tenderness   Rectal:    Normal tone, normal prostate, no masses or tenderness;   guaiac negative stool  Extremities:   Extremities normal, atraumatic, no cyanosis or edema  Pulses:   2+ and symmetric all extremities  Skin:   Skin color, texture, turgor normal, no rashes or lesions  Lymph nodes:   Cervical, supraclavicular, and axillary nodes normal  Neurologic:   CNII-XII intact, normal strength, sensation and reflexes    throughout    Disposition: Discharge disposition: 01-Home or Self Care       Patient Instructions:  Allergies as of 08/04/2018      Reactions   Amoxicillin Hives   Hydrocodone Shortness Of Breath, Other (See Comments)   "loss of consciousness"; "felt really weird"   Cherry Hives      Medication List    TAKE these medications   acetaminophen 500 MG tablet Commonly known as:  TYLENOL Take 2 tablets (1,000 mg total) by mouth every 8 (eight) hours as needed for moderate pain.   cyclobenzaprine 10 MG tablet Commonly known as:  FLEXERIL Take 1 tablet (10 mg total) by mouth 3 (three) times daily as needed for muscle spasms.   omeprazole 20 MG capsule Commonly known as:  PRILOSEC Take 1 capsule (20 mg total) by mouth daily.      Activity: activity as tolerated Diet: regular diet Wound Care: none needed  Follow-up with [facility] in  3 days.  Signed: Natale MilchChristanna R Kimber Fritts 08/04/2018 5:42 PM
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
## 2018-08-04 NOTE — H&P (Addendum)
Triage Note  Anna Flores is an 27 y.o. female.   HPI: Patient presented from office today for extended monitoring. She reported decreased fetal movement. Her NST in the office was reactive but she also reported contractions and was sent for further monitoring. She reported that her cervix was dilated to 1.5cm last week. She denies leakage of fluid. She denies vaginal bleeding. She reports that since she has been in triage she has perceived more fetal movements. She does know how to do fetal kick counts and she has been doing them this last week at home. She has been having contractions. The contractions started this morning. She describes them as strong.  She also is having pain which starts on her right side and wraps around to her belly button.  Past Medical History:  Diagnosis Date  . Abdominal pain 10/26/2013  . Anemia 2013  . Anxiety   . Depression   . History of Papanicolaou smear of cervix 10/02/2016   NEG  . Menometrorrhagia 10/26/2013  . Pregnancy    DELIVERED 08/12/16 - [facility]    Past Surgical History:  Procedure Laterality Date  . laprascopic procedure     pt. reported this pregnancy  . TONSILLECTOMY      Family History  Adopted: Yes  Problem Relation Age of Onset  . Nevi Sister        DISPLASTIC NEVUS    Social History:  reports that she has never smoked. She has never used smokeless tobacco. She reports that she does not drink alcohol or use drugs.  Allergies:  Allergies  Allergen Reactions  . Amoxicillin Hives  . Hydrocodone Shortness Of Breath and Other (See Comments)    "loss of consciousness"; "felt really weird"  . Cherry Hives    Medications: I have reviewed the patient's current medications.  Results for orders placed or performed in visit on 08/04/18 (from the past 48 hour(s))  POC Urinalysis Dipstick OB     Status: Normal   Collection Time: 08/04/18  1:37 PM  Result Value Ref Range   Color, UA     Clarity, UA     Glucose, UA Negative Negative    Bilirubin, UA     Ketones, UA     Spec Grav, UA     Blood, UA     pH, UA     POC,PROTEIN,UA Negative Negative, Trace, Small (1+), Moderate (2+), Large (3+), 4+   Urobilinogen, UA     Nitrite, UA     Leukocytes, UA     Appearance     Odor      No results found.  Review of Systems  Constitutional: Negative for chills, fever, malaise/fatigue and weight loss.  HENT: Negative for congestion, hearing loss and sinus pain.   Eyes: Negative for blurred vision and double vision.  Respiratory: Negative for cough, sputum production, shortness of breath and wheezing.   Cardiovascular: Negative for chest pain, palpitations, orthopnea and leg swelling.  Gastrointestinal: Negative for abdominal pain, constipation, diarrhea, nausea and vomiting.  Genitourinary: Negative for dysuria, flank pain, frequency, hematuria and urgency.  Musculoskeletal: Negative for back pain, falls and joint pain.  Skin: Negative for itching and rash.  Neurological: Negative for dizziness and headaches.  Psychiatric/Behavioral: Negative for depression, substance abuse and suicidal ideas. The patient is not nervous/anxious.    Blood pressure 111/70, pulse 89, temperature 98.1 F (36.7 C), temperature source Oral, resp. rate 16, height 5\' 4"  (1.626 m), weight 82.1 kg, last menstrual period 11/07/2017, SpO2 100 %,  currently breastfeeding.   Physical Exam  Nursing note and vitals reviewed. Constitutional: She is oriented to person, place, and time. She appears well-developed and well-nourished.  HENT:  Head: Normocephalic and atraumatic.  Cardiovascular: Normal rate and regular rhythm.  Respiratory: Effort normal and breath sounds normal.  GI: Soft. Bowel sounds are normal.  Musculoskeletal: Normal range of motion.  Neurological: She is alert and oriented to person, place, and time.  Skin: Skin is warm and dry.  Psychiatric: She has a normal mood and affect. Her behavior is normal. Judgment and thought content normal.    SVE: 1/60/-3  NST: 120 bpm baseline, moderate variability, 15x15 accelerations, no decelerations. Tocometer : every 4-5 minutes  Reassuring fetal movement and tone seen on bedside US AFI 7.6cm Cephalic Fetal heart motion seen.  Normal anterior placenta  Assessment/Plan: 27 yo with decreased fetal movement- reassuring fetal status and increased perception of movement by mother. Fetal status is reassuring. No signs of labor. Will discharge home. Recommended twice daily kick counts and reviewed how to perform these.  She will follow up in office later this week for an NST and US  Christanna R Schuman 08/04/2018, 5:28 PM

## 2018-08-04 NOTE — Progress Notes (Signed)
Dr. Jerene PitchSchuman present at the bedside performing assessment and taking the patient's history.

## 2018-08-05 ENCOUNTER — Encounter: Payer: Self-pay | Admitting: Maternal Newborn

## 2018-08-05 NOTE — L&D Delivery Note (Signed)
Vaginal Delivery Note  Spontaneous delivery of live viable female infant from the ROA position through an intact perineum. Delivery of anterior left shoulder with gentle downward guidance followed by delivery of the right posterior shoulder with gentle upward guidance. Body followed spontaneously. Infant placed on maternal chest. Nursery present and helped with neonatal resuscitation and evaluation. Cord clamped and cut after one minute. Cord blood collected. Placenta delivered spontaneously and intact with a 3 vessel cord.  No lacerations. Uterus firm and below umbilicus at the end of the delivery.  Mom and baby recovering in stable condition. Sponge and needle counts were correct at the end of the delivery.  APGARS: 1 minute:8 5 minutes: 8 Weight: 5lbs 5oz Name: R[place]  Adelene Idler MD [facility], [facility] 08/12/18 2:20 AM

## 2018-08-06 ENCOUNTER — Other Ambulatory Visit: Payer: Self-pay | Admitting: Obstetrics and Gynecology

## 2018-08-06 DIAGNOSIS — O365990 Maternal care for other known or suspected poor fetal growth, unspecified trimester, not applicable or unspecified: Secondary | ICD-10-CM

## 2018-08-06 DIAGNOSIS — O0990 Supervision of high risk pregnancy, unspecified, unspecified trimester: Secondary | ICD-10-CM

## 2018-08-07 ENCOUNTER — Ambulatory Visit (INDEPENDENT_AMBULATORY_CARE_PROVIDER_SITE_OTHER): Payer: Medicaid Other | Admitting: Certified Nurse Midwife

## 2018-08-07 ENCOUNTER — Ambulatory Visit (INDEPENDENT_AMBULATORY_CARE_PROVIDER_SITE_OTHER): Payer: Medicaid Other

## 2018-08-07 ENCOUNTER — Other Ambulatory Visit (INDEPENDENT_AMBULATORY_CARE_PROVIDER_SITE_OTHER): Payer: Medicaid Other

## 2018-08-07 ENCOUNTER — Other Ambulatory Visit: Payer: Self-pay | Admitting: Certified Nurse Midwife

## 2018-08-07 VITALS — BP 110/62 | Wt 183.0 lb

## 2018-08-07 DIAGNOSIS — Z3A37 37 weeks gestation of pregnancy: Secondary | ICD-10-CM

## 2018-08-07 DIAGNOSIS — O0990 Supervision of high risk pregnancy, unspecified, unspecified trimester: Secondary | ICD-10-CM

## 2018-08-07 DIAGNOSIS — O365930 Maternal care for other known or suspected poor fetal growth, third trimester, not applicable or unspecified: Secondary | ICD-10-CM | POA: Diagnosis not present

## 2018-08-07 DIAGNOSIS — O288 Other abnormal findings on antenatal screening of mother: Secondary | ICD-10-CM

## 2018-08-07 DIAGNOSIS — O365990 Maternal care for other known or suspected poor fetal growth, unspecified trimester, not applicable or unspecified: Secondary | ICD-10-CM

## 2018-08-07 LAB — POCT URINALYSIS DIPSTICK OB
Glucose, UA: NEGATIVE
POC,PROTEIN,UA: NEGATIVE

## 2018-08-07 LAB — FETAL NONSTRESS TEST

## 2018-08-07 NOTE — Progress Notes (Signed)
ROB AFI/NST 

## 2018-08-08 NOTE — Addendum Note (Signed)
Addended by: Farrel Conners on: 08/08/2018 07:11 PM   Modules accepted: Orders

## 2018-08-08 NOTE — Progress Notes (Signed)
HROB at 37wk3d: Dopplers/ AFI and NST for IUGR: Good FM. Dopplers WNL. AFI 10.2cm NST was equivocal. Baseline 135 with four accelerations to 150s in 15 minutes.  Had two variable 20 sec decelerations to nadir of 100. Dr Jerene PitchSchuman consulted and BPP done per her recommendations. BPP 8/8.  IOL has been scheduled for 7 Jan. Cervix: 1+/40%/-3 Instructed in doing FKCs BID (4 or more FMs in 1 hour BID) Labor precautions  Farrel Connersolleen Michaline Kindig, CNM

## 2018-08-11 ENCOUNTER — Inpatient Hospital Stay
Admission: EM | Admit: 2018-08-11 | Discharge: 2018-08-13 | DRG: 806 | Disposition: A | Discharge status: Home / Self Care | Payer: Medicaid Other | Attending: Obstetrics and Gynecology | Admitting: Obstetrics and Gynecology

## 2018-08-11 ENCOUNTER — Other Ambulatory Visit: Payer: Self-pay

## 2018-08-11 DIAGNOSIS — O09299 Supervision of pregnancy with other poor reproductive or obstetric history, unspecified trimester: Secondary | ICD-10-CM

## 2018-08-11 DIAGNOSIS — O365930 Maternal care for other known or suspected poor fetal growth, third trimester, not applicable or unspecified: Principal | ICD-10-CM | POA: Diagnosis present

## 2018-08-11 DIAGNOSIS — O9081 Anemia of the puerperium: Secondary | ICD-10-CM | POA: Diagnosis not present

## 2018-08-11 DIAGNOSIS — O99214 Obesity complicating childbirth: Secondary | ICD-10-CM | POA: Diagnosis present

## 2018-08-11 DIAGNOSIS — O0990 Supervision of high risk pregnancy, unspecified, unspecified trimester: Secondary | ICD-10-CM

## 2018-08-11 DIAGNOSIS — Z3A38 38 weeks gestation of pregnancy: Secondary | ICD-10-CM

## 2018-08-11 DIAGNOSIS — O99210 Obesity complicating pregnancy, unspecified trimester: Secondary | ICD-10-CM

## 2018-08-11 DIAGNOSIS — Z6833 Body mass index (BMI) 33.0-33.9, adult: Secondary | ICD-10-CM

## 2018-08-11 DIAGNOSIS — O365990 Maternal care for other known or suspected poor fetal growth, unspecified trimester, not applicable or unspecified: Secondary | ICD-10-CM

## 2018-08-11 DIAGNOSIS — E669 Obesity, unspecified: Secondary | ICD-10-CM | POA: Diagnosis present

## 2018-08-11 DIAGNOSIS — D62 Acute posthemorrhagic anemia: Secondary | ICD-10-CM | POA: Diagnosis not present

## 2018-08-11 DIAGNOSIS — Z3490 Encounter for supervision of normal pregnancy, unspecified, unspecified trimester: Secondary | ICD-10-CM | POA: Diagnosis present

## 2018-08-11 DIAGNOSIS — Z8632 Personal history of gestational diabetes: Secondary | ICD-10-CM

## 2018-08-11 DIAGNOSIS — O210 Mild hyperemesis gravidarum: Secondary | ICD-10-CM

## 2018-08-11 LAB — CBC
HCT: 32.4 % — ABNORMAL LOW (ref 36.0–46.0)
Hemoglobin: 10.6 g/dL — ABNORMAL LOW (ref 12.0–15.0)
MCH: 27.9 pg (ref 26.0–34.0)
MCHC: 32.7 g/dL (ref 30.0–36.0)
MCV: 85.3 fL (ref 80.0–100.0)
Platelets: 211 10*3/uL (ref 150–400)
RBC: 3.8 MIL/uL — ABNORMAL LOW (ref 3.87–5.11)
RDW: 14.3 % (ref 11.5–15.5)
WBC: 9.4 10*3/uL (ref 4.0–10.5)
nRBC: 0 % (ref 0.0–0.2)

## 2018-08-11 LAB — TYPE AND SCREEN
ABO/RH(D): O POS
Antibody Screen: NEGATIVE

## 2018-08-11 MED ORDER — LIDOCAINE HCL (PF) 1 % IJ SOLN: 30.0000 mL | INTRAMUSCULAR | Status: DC | PRN

## 2018-08-11 MED ORDER — ONDANSETRON HCL 4 MG/2ML IJ SOLN
4.0000 mg | Freq: Four times a day (QID) | INTRAMUSCULAR | Status: DC | PRN
  Administered 2018-08-11: 4 mg via INTRAVENOUS
  Filled 2018-08-11: qty 2

## 2018-08-11 MED ORDER — LACTATED RINGERS IV SOLN
INTRAVENOUS | Status: DC
  Administered 2018-08-11 (×2): via INTRAVENOUS

## 2018-08-11 MED ORDER — LIDOCAINE HCL (PF) 1 % IJ SOLN
INTRAMUSCULAR | Status: AC
  Filled 2018-08-11: qty 30

## 2018-08-11 MED ORDER — BUTORPHANOL TARTRATE 2 MG/ML IJ SOLN
INTRAMUSCULAR | Status: AC
  Filled 2018-08-11: qty 1

## 2018-08-11 MED ORDER — MISOPROSTOL 200 MCG PO TABS
ORAL_TABLET | ORAL | Status: AC
  Filled 2018-08-11: qty 4

## 2018-08-11 MED ORDER — FAMOTIDINE IN NACL 20-0.9 MG/50ML-% IV SOLN
20.0000 mg | Freq: Once | INTRAVENOUS | Status: AC
  Administered 2018-08-11: 20 mg via INTRAVENOUS
  Filled 2018-08-11: qty 50

## 2018-08-11 MED ORDER — AMMONIA AROMATIC IN INHA: 0.3000 mL | Freq: Once | RESPIRATORY_TRACT | Status: DC | PRN

## 2018-08-11 MED ORDER — OXYTOCIN 40 UNITS IN LACTATED RINGERS INFUSION - SIMPLE MED
INTRAVENOUS | Status: AC
  Filled 2018-08-11: qty 1000

## 2018-08-11 MED ORDER — TERBUTALINE SULFATE 1 MG/ML IJ SOLN: 0.2500 mg | Freq: Once | INTRAMUSCULAR | Status: DC | PRN

## 2018-08-11 MED ORDER — BUTORPHANOL TARTRATE 2 MG/ML IJ SOLN
1.0000 mg | INTRAMUSCULAR | Status: DC | PRN
  Administered 2018-08-11: 1 mg via INTRAVENOUS

## 2018-08-11 MED ORDER — LACTATED RINGERS IV SOLN: 500.0000 mL | INTRAVENOUS | Status: DC | PRN

## 2018-08-11 MED ORDER — OXYTOCIN 40 UNITS IN NORMAL SALINE INFUSION - SIMPLE MED
1.0000 m[IU]/min | INTRAVENOUS | Status: DC
  Administered 2018-08-11: 2 m[IU]/min via INTRAVENOUS
  Filled 2018-08-11: qty 1000

## 2018-08-11 MED ORDER — OXYTOCIN 10 UNIT/ML IJ SOLN
INTRAMUSCULAR | Status: AC
  Filled 2018-08-11: qty 2

## 2018-08-11 MED ORDER — OXYTOCIN BOLUS FROM INFUSION
500.0000 mL | Freq: Once | INTRAVENOUS | Status: AC
  Administered 2018-08-12: 500 mL via INTRAVENOUS

## 2018-08-11 MED ORDER — OXYTOCIN 40 UNITS IN NORMAL SALINE INFUSION - SIMPLE MED
2.5000 [IU]/h | INTRAVENOUS | Status: DC
  Administered 2018-08-12: 2.5 [IU]/h via INTRAVENOUS
  Filled 2018-08-11 (×2): qty 1000

## 2018-08-11 MED ORDER — AMMONIA AROMATIC IN INHA
RESPIRATORY_TRACT | Status: AC
  Filled 2018-08-11: qty 10

## 2018-08-11 MED ORDER — MISOPROSTOL 200 MCG PO TABS: 800.0000 ug | ORAL_TABLET | ORAL | Status: DC

## 2018-08-11 MED ORDER — SODIUM CHLORIDE 0.9 % IV SOLN
10.0000 mg | Freq: Two times a day (BID) | INTRAVENOUS | Status: DC
  Administered 2018-08-11: 10 mg via INTRAVENOUS
  Filled 2018-08-11 (×2): qty 1

## 2018-08-11 NOTE — Progress Notes (Signed)
Patient seen and examined. She is comfortable and laboring in bed.  SVE 4.5/70/-3 NST: 120 bpm baseline, moderate variability, 15x15 accelerations, no decelerations. Tocometer : graphing poorly, Mushtaq reports feeling contractions approximately every 2 minutes.  Will continue with induction with pitocin.  Patient does not require pain control with epidural or IV medication at this time.  Adelene Idler MD [facility], [facility] 08/11/2018 8:32 PM

## 2018-08-11 NOTE — ED Notes (Signed)
First Nurse Note: Patient to M/B via WC with M/B staff.

## 2018-08-11 NOTE — ED Notes (Signed)
First Nurse Note: Patient states she is here for induction.  M/B notified of need for transport.

## 2018-08-11 NOTE — H&P (Signed)
OB History & Physical   History of Present Illness:  Chief Complaint:   HPI:  Anna Flores is a 28 y.o. [phone] female with EDC=08/25/2018 at [redacted]w[redacted]d dated by a 7wk3d ultrasound.  Her pregnancy has been complicated by hyperemesis (net weight loss of 8-9#), anxiety/depression, obesity, shingles, and IUGR. An ultrasound on 12/27 at [redacted] weeks gestation gave EFW<10th% (4#7oz). AFIs and Doppler studies have been normal. Antepartum NSTs have been reassuring, however the last NST on 1/3 was equivocal with some accelerations, but some mild decelerations also for 10-20 seconds. A biophysical profile was 8/8. She presents to L&D for induction of labor for asymmetric IUGR.  Reports irregular, sometimes more intense contractions. Baby active. No vaginal bleeding or leakage of fluid.  Prenatal care site: Prenatal care at [facility] has also been remarkable for having laparoscopic surgery in early pregnancy due to abdominal pain/ suspected ectopic pregnancy. [facility]  Dating 7 week ultrasound Blood type:   O POS  Genetic Screen AFP:  neg  NIPS: diploid XY Antibody: negative  Anatomic [place] Complete Rubella:RI Varicella: VI  GTT HgbA1C 4.9% Third trimester:  RPR: negative  Rhogam N/A HBsAg:negative  TDaP vaccine 08/14/16 Flu Shot: declined HIV: non reactive  Baby Food       Breast                         GBS: negative 12/27  Contraception BTL paper signed 11/27 Pap: 10/02/2016 NIL  CBB     CS/VBAC NA   Support Person Husband [name]    Pelvis proven to 8#6oz. Patient also desires BTL in case of a Cesarean section. Wants to defer a BTL at tis time if delivers vaginally because plans for additional help with the children has fallen through. 30 day papers have been signed and are in the computer.       Maternal Medical History:   Past Medical History:  Diagnosis Date  . Abdominal pain 10/26/2013  . Anemia 2013  . Anxiety   . Depression   . History of Papanicolaou smear of cervix  10/02/2016   NEG  . Menometrorrhagia 10/26/2013  . Pregnancy    DELIVERED 08/12/16 - [facility]   OB History  Gravida Para Term Preterm AB Living  4 3 3     3   SAB TAB Ectopic Multiple Live Births        0 3    # Outcome Date GA Lbr Len/2nd Weight Sex Delivery Anes PTL Lv  4 Current           3 Term 08/12/16 [redacted]w[redacted]d / 00:23 3618 g M Vag-Spont EPI  LIV  2 Term 11/18/14 [redacted]w[redacted]d  3175 g M Vag-Spont  N LIV  1 Term 02/09/13 [redacted]w[redacted]d  3799 g Kateri Plummer LIV   Past Surgical History:  Procedure Laterality Date  . DIAGNOSTIC LAPAROSCOPY  2018   suspected ectopic pregnancy-[facility]  . TONSILLECTOMY      Allergies  Allergen Reactions  . Amoxicillin Hives  . Hydrocodone Shortness Of Breath and Other (See Comments)    "loss of consciousness"; "felt really weird" when she was given 10 mgm hydrocodone. Did well with 5 mgm tablet  . Cherry Hives    Prior to Admission medications   Medication Sig Start Date End Date Taking? Authorizing Provider  acetaminophen (TYLENOL) 500 MG tablet Take 2 tablets (1,000 mg total) by mouth every 8 (eight) hours as needed for moderate pain. 06/07/18  Conard Novak, MD  diphenhydrAMINE HCl, Sleep, (UNISOM SLEEPGELS) 50 MG CAPS Take 1 capsule by mouth at bedtime as needed.    [provider]  omeprazole (PRILOSEC) 20 MG capsule Take 1 capsule (20 mg total) by mouth daily. 06/24/18   Vena Austria, MD          Social History: She  reports that she has never smoked. She has never used smokeless tobacco. She reports that she does not drink alcohol or use drugs.  Family History: family history includes Nevi in her sister. She was adopted.   Review of Systems: Negative x 10 systems reviewed except as noted in the HPI.      Physical Exam:  Vital Signs: BP 105/67 (BP Location: Left Arm)   Pulse (!) 103   Temp 98.3 F (36.8 C) (Oral)   Resp 16   Ht 5\' 4"  (1.626 m)   Wt 82.1 kg   LMP 11/07/2017   BMI 31.07 kg/m  General: gravid WF in no acute  distress.  HEENT: normocephalic, atraumatic Heart: regular rate & rhythm.  No murmurs/rubs/gallops Lungs: clear to auscultation bilaterally Abdomen: soft, gravid, non-tender;  EFW: 5 1/2# Pelvic:   External: Normal external female genitalia  Cervix: Dilation: 1.5 / Effacement (%): 50 / Station: -2 / mid/ soft/   Extremities: non-tender, symmetric, no edema bilaterally.  DTRs: +1  Neurologic: Alert & oriented x 3.   Baseline FHR: 135 baseline with accelerations to 150, moderate variability Toco: some irritability Bedside Ultrasound: LOT/LOP position    Assessment:  Anna Flores is a 28 y.o. [phone] female at [redacted]w[redacted]d with asymmetric IUGR for induction of labor FWB: Cat 1 Bishop score: 6 Plan:  1. Admit to Labor & Delivery    2. CBC, T&S, Clrs, IVF 3. GBS negative.   4. Consents obtained. 5. Plan on starting with prolonged OCT. If negative, then consider use of Cytotec and/or  foley bulb. Reviewed risks of induction of labor including hyperstimulation, fetal intolerance, and Cesarean section with patient. She has had her questions answered and verbalizes understanding. She wishes to proceed with plan.  6  O POS/ VI/ RI 7. Breast 8. Last TDAP 2018 9. BTL if needs Cesarean section. Will defer BTL if delivers vaginally.   Farrel Conners, CNM  Farrel Conners  08/11/2018 9:18 AM

## 2018-08-12 DIAGNOSIS — Z3A38 38 weeks gestation of pregnancy: Secondary | ICD-10-CM

## 2018-08-12 DIAGNOSIS — O365930 Maternal care for other known or suspected poor fetal growth, third trimester, not applicable or unspecified: Secondary | ICD-10-CM

## 2018-08-12 LAB — CBC
HCT: 32 % — ABNORMAL LOW (ref 36.0–46.0)
HEMOGLOBIN: 10.7 g/dL — AB (ref 12.0–15.0)
MCH: 28.2 pg (ref 26.0–34.0)
MCHC: 33.4 g/dL (ref 30.0–36.0)
MCV: 84.4 fL (ref 80.0–100.0)
Platelets: 217 10*3/uL (ref 150–400)
RBC: 3.79 MIL/uL — ABNORMAL LOW (ref 3.87–5.11)
RDW: 14 % (ref 11.5–15.5)
WBC: 10.6 10*3/uL — ABNORMAL HIGH (ref 4.0–10.5)
nRBC: 0 % (ref 0.0–0.2)

## 2018-08-12 LAB — RPR: RPR Ser Ql: NONREACTIVE

## 2018-08-12 MED ORDER — KETOROLAC TROMETHAMINE 30 MG/ML IJ SOLN
INTRAMUSCULAR | Status: AC
  Administered 2018-08-12: 30 mg
  Filled 2018-08-12: qty 1

## 2018-08-12 MED ORDER — FERROUS SULFATE 325 (65 FE) MG PO TABS
325.0000 mg | ORAL_TABLET | Freq: Two times a day (BID) | ORAL | Status: DC
  Administered 2018-08-12 – 2018-08-13 (×4): 325 mg via ORAL
  Filled 2018-08-12 (×4): qty 1

## 2018-08-12 MED ORDER — ACETAMINOPHEN 325 MG PO TABS
650.0000 mg | ORAL_TABLET | ORAL | Status: DC | PRN
  Administered 2018-08-12 (×5): 650 mg via ORAL
  Filled 2018-08-12 (×5): qty 2

## 2018-08-12 MED ORDER — METHYLERGONOVINE MALEATE 0.2 MG/ML IJ SOLN
0.2000 mg | Freq: Once | INTRAMUSCULAR | Status: AC
  Administered 2018-08-12: 0.2 mg via INTRAMUSCULAR

## 2018-08-12 MED ORDER — IBUPROFEN 600 MG PO TABS
600.0000 mg | ORAL_TABLET | Freq: Four times a day (QID) | ORAL | Status: DC
  Administered 2018-08-13 (×3): 600 mg via ORAL
  Filled 2018-08-12 (×3): qty 1

## 2018-08-12 MED ORDER — DIBUCAINE 1 % RE OINT: 1.0000 "application " | TOPICAL_OINTMENT | RECTAL | Status: DC | PRN

## 2018-08-12 MED ORDER — KETOROLAC TROMETHAMINE 30 MG/ML IJ SOLN: 30.0000 mg | Freq: Once | INTRAMUSCULAR | Status: AC

## 2018-08-12 MED ORDER — WITCH HAZEL-GLYCERIN EX PADS: 1.0000 "application " | MEDICATED_PAD | CUTANEOUS | Status: DC | PRN

## 2018-08-12 MED ORDER — ONDANSETRON HCL 4 MG PO TABS: 4.0000 mg | ORAL_TABLET | ORAL | Status: DC | PRN

## 2018-08-12 MED ORDER — SIMETHICONE 80 MG PO CHEW: 80.0000 mg | CHEWABLE_TABLET | ORAL | Status: DC | PRN

## 2018-08-12 MED ORDER — PRENATAL MULTIVITAMIN CH
1.0000 | ORAL_TABLET | Freq: Every day | ORAL | Status: DC
  Administered 2018-08-12 – 2018-08-13 (×2): 1 via ORAL
  Filled 2018-08-12 (×2): qty 1

## 2018-08-12 MED ORDER — IBUPROFEN 600 MG PO TABS
600.0000 mg | ORAL_TABLET | Freq: Four times a day (QID) | ORAL | Status: DC
  Administered 2018-08-12 (×3): 600 mg via ORAL
  Filled 2018-08-12 (×3): qty 1

## 2018-08-12 MED ORDER — ZOLPIDEM TARTRATE 5 MG PO TABS: 5.0000 mg | ORAL_TABLET | Freq: Every evening | ORAL | Status: DC | PRN

## 2018-08-12 MED ORDER — DOCUSATE SODIUM 100 MG PO CAPS
100.0000 mg | ORAL_CAPSULE | Freq: Two times a day (BID) | ORAL | Status: DC
  Administered 2018-08-12 – 2018-08-13 (×2): 100 mg via ORAL
  Filled 2018-08-12 (×2): qty 1

## 2018-08-12 MED ORDER — MAGNESIUM HYDROXIDE 400 MG/5ML PO SUSP
30.0000 mL | ORAL | Status: DC | PRN
  Filled 2018-08-12: qty 30

## 2018-08-12 MED ORDER — METHYLERGONOVINE MALEATE 0.2 MG/ML IJ SOLN
INTRAMUSCULAR | Status: AC
  Filled 2018-08-12: qty 1

## 2018-08-12 MED ORDER — ONDANSETRON HCL 4 MG/2ML IJ SOLN: 4.0000 mg | INTRAMUSCULAR | Status: DC | PRN

## 2018-08-12 MED ORDER — DIPHENHYDRAMINE HCL 25 MG PO CAPS: 25.0000 mg | ORAL_CAPSULE | Freq: Four times a day (QID) | ORAL | Status: DC | PRN

## 2018-08-12 MED ORDER — COCONUT OIL OIL
1.0000 "application " | TOPICAL_OIL | Status: DC | PRN
  Administered 2018-08-12: 1 via TOPICAL
  Filled 2018-08-12: qty 120

## 2018-08-12 MED ORDER — BENZOCAINE-MENTHOL 20-0.5 % EX AERO
1.0000 "application " | INHALATION_SPRAY | CUTANEOUS | Status: DC | PRN
  Administered 2018-08-12: 1 via TOPICAL
  Filled 2018-08-12: qty 56

## 2018-08-12 NOTE — Progress Notes (Signed)
AROM, clear Some bloody show SVE: 5/70/-3 IUPC laced without difficulty  NST: 110 bpm baseline, moderate variability, accelerations present, early decelerations. Tocometer : every 1-2 minutes  Continue with Pitocin for labor induction  Adelene Idler MD [facility], [facility] 08/12/2018 1:43 AM

## 2018-08-12 NOTE — Lactation Note (Signed)
This note was copied from a baby's chart. Lactation Consultation Note  Patient Name: Anna Flores [id number] Date: 08/12/2018 Reason for consult: Initial assessment   Maternal Data    Feeding Feeding Type: Breast Fed  LATCH Score                   Interventions    Lactation Tools Discussed/Used     Consult Status  Mother states that her older children had tongue ties but not severe enough to need corrected. Mother also states that infant is clicking at the breast and she feels he may also have a tongue tie. Mother states that she feels slightly sore when infant is breastfeeding but is able to sandwich her breast to get infant to latch on. LC provided mother with a nipple shield and mother states that she will try at the next feeding to see if it helps infant latch better.    Arlyss Gandy 08/12/2018, 3:28 PM

## 2018-08-12 NOTE — Discharge Instructions (Signed)
Vaginal Delivery, Care After °Refer to this sheet in the next few weeks. These instructions provide you with information about caring for yourself after vaginal delivery. Your health care provider may also give you more specific instructions. Your treatment has been planned according to current medical practices, but problems sometimes occur. Call your health care provider if you have any problems or questions. °What can I expect after the procedure? °After vaginal delivery, it is common to have: °· Some bleeding from your vagina. °· Soreness in your abdomen, your vagina, and the area of skin between your vaginal opening and your anus (perineum). °· Pelvic cramps. °· Fatigue. °Follow these instructions at home: °Medicines °· Take over-the-counter and prescription medicines only as told by your health care provider. °· If you were prescribed an antibiotic medicine, take it as told by your health care provider. Do not stop taking the antibiotic until it is finished. °Driving ° °· Do not drive or operate heavy machinery while taking prescription pain medicine. °· Do not drive for 24 hours if you received a sedative. °Lifestyle °· Do not drink alcohol. This is especially important if you are breastfeeding or taking medicine to relieve pain. °· Do not use tobacco products, including cigarettes, chewing tobacco, or e-cigarettes. If you need help quitting, ask your health care provider. °Eating and drinking °· Drink at least 8 eight-ounce glasses of water every day unless you are told not to by your health care provider. If you choose to breastfeed your baby, you may need to drink more water than this. °· Eat high-fiber foods every day. These foods may help prevent or relieve constipation. High-fiber foods include: °? Whole grain cereals and breads. °? Brown rice. °? Beans. °? Fresh fruits and vegetables. °Activity °· Return to your normal activities as told by your health care provider. Ask your health care provider what  activities are safe for you. °· Rest as much as possible. Try to rest or take a nap when your baby is sleeping. °· Do not lift anything that is heavier than your baby or 10 lb (4.5 kg) until your health care provider says that it is safe. °· Talk with your health care provider about when you can engage in sexual activity. This may depend on your: °? Risk of infection. °? Rate of healing. °? Comfort and desire to engage in sexual activity. °Vaginal Care °· If you have an episiotomy or a vaginal tear, check the area every day for signs of infection. Check for: °? More redness, swelling, or pain. °? More fluid or blood. °? Warmth. °? Pus or a bad smell. °· Do not use tampons or douches until your health care provider says this is safe. °· Watch for any blood clots that may pass from your vagina. These may look like clumps of dark red, brown, or black discharge. °General instructions °· Keep your perineum clean and dry as told by your health care provider. °· Wear loose, comfortable clothing. °· Wipe from front to back when you use the toilet. °· Ask your health care provider if you can shower or take a bath. If you had an episiotomy or a perineal tear during labor and delivery, your health care provider may tell you not to take baths for a certain length of time. °· Wear a bra that supports your breasts and fits you well. °· If possible, have someone help you with household activities and help care for your baby for at least a few days after you   leave the hospital. °· Keep all follow-up visits for you and your baby as told by your health care provider. This is important. °Contact a health care provider if: °· You have: °? Vaginal discharge that has a bad smell. °? Difficulty urinating. °? Pain when urinating. °? A sudden increase or decrease in the frequency of your bowel movements. °? More redness, swelling, or pain around your episiotomy or vaginal tear. °? More fluid or blood coming from your episiotomy or vaginal  tear. °? Pus or a bad smell coming from your episiotomy or vaginal tear. °? A fever. °? A rash. °? Little or no interest in activities you used to enjoy. °? Questions about caring for yourself or your baby. °· Your episiotomy or vaginal tear feels warm to the touch. °· Your episiotomy or vaginal tear is separating or does not appear to be healing. °· Your breasts are painful, hard, or turn red. °· You feel unusually sad or worried. °· You feel nauseous or you vomit. °· You pass large blood clots from your vagina. If you pass a blood clot from your vagina, save it to show to your health care provider. Do not flush blood clots down the toilet without having your health care provider look at them. °· You urinate more than usual. °· You are dizzy or light-headed. °· You have not breastfed at all and you have not had a menstrual period for 12 weeks after delivery. °· You have stopped breastfeeding and you have not had a menstrual period for 12 weeks after you stopped breastfeeding. °Get help right away if: °· You have: °? Pain that does not go away or does not get better with medicine. °? Chest pain. °? Difficulty breathing. °? Blurred vision or spots in your vision. °? Thoughts about hurting yourself or your baby. °· You develop pain in your abdomen or in one of your legs. °· You develop a severe headache. °· You faint. °· You bleed from your vagina so much that you fill two sanitary pads in one hour. °This information is not intended to replace advice given to you by your health care provider. Make sure you discuss any questions you have with your health care provider. °Document Released: 07/19/2000 Document Revised: 01/03/2016 Document Reviewed: 08/06/2015 °Elsevier Interactive Patient Education © 2019 [employer] ° °

## 2018-08-12 NOTE — Progress Notes (Signed)
PPD#0 SVD Subjective:  Sitting up in bed, holding baby. Conversant and cheerful. Having some afterpains; pain control is adequate. Voiding without difficulty. Tolerating a regular diet. Ambulating well.  Objective:   Blood pressure 101/69, pulse 62, temperature 98 F (36.7 C), temperature source Oral, resp. rate 20, height 5\' 4"  (1.626 m), weight 82.1 kg, last menstrual period 11/07/2017, SpO2 99 %, currently breastfeeding.  General: NAD Pulmonary: no increased work of breathing Abdomen: non-distended, non-tender Uterus:  fundus firm; lochia appropriate Extremities: no edema, no erythema, no tenderness, no signs of DVT  Results for orders placed or performed during the hospital encounter of 08/11/18 (from the past 72 hour(s))  CBC     Status: Abnormal   Collection Time: 08/11/18  9:48 AM  Result Value Ref Range   WBC 9.4 4.0 - 10.5 K/uL   RBC 3.80 (L) 3.87 - 5.11 MIL/uL   Hemoglobin 10.6 (L) 12.0 - 15.0 g/dL   HCT 95.6 (L) 38.7 - 56.4 %   MCV 85.3 80.0 - 100.0 fL   MCH 27.9 26.0 - 34.0 pg   MCHC 32.7 30.0 - 36.0 g/dL   RDW 33.2 95.1 - 88.4 %   Platelets 211 150 - 400 K/uL   nRBC 0.0 0.0 - 0.2 %    Comment: Performed at [facility], [address] [place], [place] [address]  RPR     Status: None   Collection Time: 08/11/18  9:48 AM  Result Value Ref Range   RPR Ser Ql Non Reactive Non Reactive    Comment: (NOTE) Performed At: [facility] [address] [place], [place] [id number] Jolene Schimke MD AT:[id number]   Type and screen     Status: None   Collection Time: 08/11/18 10:40 AM  Result Value Ref Range   ABO/RH(D) O POS    Antibody Screen NEG    Sample Expiration      08/14/2018 Performed at [facility], [address] [place], [place] [address]   CBC     Status: Abnormal   Collection Time: 08/12/18  6:52 AM  Result Value Ref Range   WBC 10.6 (H) 4.0 - 10.5 K/uL   RBC 3.79 (L) 3.87 - 5.11 MIL/uL   Hemoglobin 10.7 (L) 12.0 - 15.0  g/dL   HCT 37.6 (L) 28.3 - 15.1 %   MCV 84.4 80.0 - 100.0 fL   MCH 28.2 26.0 - 34.0 pg   MCHC 33.4 30.0 - 36.0 g/dL   RDW 76.1 60.7 - 37.1 %   Platelets 217 150 - 400 K/uL   nRBC 0.0 0.0 - 0.2 %    Comment: Performed at [facility], [address] [place], [place] [address]    Assessment:   28 y.o. [id number] postpartum day #0 in good condition.  Plan:   1) Acute blood loss anemia - hemodynamically stable and asymptomatic - Ferrous sulfate and continue prenatal vitamins with iron  2) Blood Type --/--/O POS (01/07 1040)   3) Rubella 6.03 (06/28 1045) / Varicella Immune / TDAP status: received 08/14/2016  4) Breast feeding  5) Contraception: IUD?  6) Disposition: continue postpartum care.  Marcelyn Bruins, CNM 08/12/2018

## 2018-08-12 NOTE — Discharge Summary (Signed)
OB Discharge Summary     Patient Name: Anna Flores DOB: May 03, 1991 MRN: [id number]  Date of admission: 08/11/2018 Delivering MD: Vena Austria, MD  Date of Delivery: 08/11/2018  Date of discharge: 08/13/2018  Admitting diagnosis: 38 weeks;induction Intrauterine pregnancy: [redacted]w[redacted]d     Secondary diagnosis: IUGR None     Discharge diagnosis: Term Pregnancy Delivered                    Hospital course:  Induction of Labor With Vaginal Delivery   28 y.o. yo [phone] at [redacted]w[redacted]d was admitted to the hospital 08/11/2018 for induction of labor.  Indication for induction: IUGR.  Patient had an uncomplicated labor course as follows: Membrane Rupture Time/Date: 1:12 AM ,08/12/2018   Intrapartum Procedures: Episiotomy: None [1]                                         Lacerations:  None [1]  Patient had delivery of a Viable infant.  Information for the patient's newborn:  Jalene, Lawrie [[id number]]  Delivery Method: Vag-Spont   08/12/2018  Details of delivery can be found in separate delivery note.  Patient had a routine postpartum course. Patient is discharged home 08/13/18.                                                                 Post partum procedures:none  Complications: None  Physical exam on 08/13/2018: Vitals:   08/12/18 2309 08/13/18 0744 08/13/18 1146 08/13/18 1526  BP: 110/78 106/72 109/84 103/75  Pulse: 80 71 80 77  Resp:  18 18 20   Temp: 97.6 F (36.4 C) 98.1 F (36.7 C) 98 F (36.7 C) 98 F (36.7 C)  TempSrc: Oral Oral Oral Oral  SpO2: 99% 98% 97% 98%  Weight:      Height:        Labs: Lab Results  Component Value Date   WBC 10.6 (H) 08/12/2018   HGB 10.7 (L) 08/12/2018   HCT 32.0 (L) 08/12/2018   MCV 84.4 08/12/2018   PLT 217 08/12/2018   CMP Latest Ref Rng & Units 02/23/2018  Glucose 70 - 99 mg/dL 85  BUN 6 - 20 mg/dL <5(A)  Creatinine 3.09 - 1.00 mg/dL 4.07  Sodium 680 - 881 mmol/L 137  Potassium 3.5 - 5.1 mmol/L 3.1(L)  Chloride 98 - 111 mmol/L 105  CO2 22 -  32 mmol/L 26  Calcium 8.9 - 10.3 mg/dL 9.2  Total Protein 6.0 - 8.5 g/dL -  Total Bilirubin 0.0 - 1.2 mg/dL -  Alkaline Phos 39 - 103 IU/L -  AST 0 - 40 IU/L -  ALT 0 - 32 IU/L -    Discharge instruction: per After Visit Summary.  Medications:  Allergies as of 08/13/2018      Reactions   Amoxicillin Hives   Hydrocodone Shortness Of Breath, Other (See Comments)   "loss of consciousness"; "felt really weird" when she was given 10 mgm hydrocodone. Did well with 5 mgm tablet   Cherry Hives      Medication List    TAKE these medications   acetaminophen 500 MG tablet Commonly known as:  TYLENOL Take 2  tablets (1,000 mg total) by mouth every 8 (eight) hours as needed for moderate pain.   omeprazole 20 MG capsule Commonly known as:  PRILOSEC Take 1 capsule (20 mg total) by mouth daily.   UNISOM SLEEPGELS 50 MG Caps Generic drug:  diphenhydrAMINE HCl (Sleep) Take 1 capsule by mouth at bedtime as needed.       Diet: routine diet  Activity: Advance as tolerated. Pelvic rest for 6 weeks.   Outpatient follow up: Follow-up Information    Schuman, Jaquelyn Bitter, MD. Schedule an appointment as soon as possible for a visit in 6 week(s).   Specialty:  Obstetrics and Gynecology Why:  please call and schedule a follow up appointment for 6 weeks for a postpartum check Contact information: [address] [place] [address] [phone]             Postpartum contraception: Not Discussed Rhogam Given postpartum: no Rubella vaccine given postpartum: no Varicella vaccine given postpartum: no   Newborn Data: Live born child  Birth Weight:   APGAR: ,   Newborn Delivery   Birth date/time:  08/12/2018 01:57:00 Delivery type:  Vaginal, Spontaneous      Baby Feeding: Breast  Name: Ryland  Disposition:home with mother  SIGNED: Vena Austria, MD 08/13/2018 6:52 PM

## 2018-08-13 NOTE — Progress Notes (Signed)
08/13/2018 8:01 PM  BP 103/75 (BP Location: Left Arm)   Pulse 77   Temp 98 F (36.7 C) (Oral)   Resp 20   Ht 5\' 4"  (162.6 cm)   Wt 82.1 kg   LMP 11/07/2017   SpO2 98%   Breastfeeding Unknown   BMI 31.07 kg/m  Patient discharged per MD orders. Discharge instructions reviewed with patient and patient verbalized understanding.  Discharged via wheelchair escorted by RN with infant in carseat.  Ron Parker, RN

## 2018-08-13 NOTE — Progress Notes (Signed)
Post Partum Day 1 Subjective: Doing well, no complaints.  Tolerating regular diet, pain with PO meds, voiding and ambulating without difficulty. She reports breastfeeding is going well. She plans IUD for now with planning for tubal at a later date. She is ready to discharge today. Baby is currently on bili blanket and pending bilirubin level may be able to discharge later.   No CP SOB F/C N/V or leg pain No HA, change of vision, RUQ/epigastric pain  Objective: BP 106/72 (BP Location: Left Arm)   Pulse 71   Temp 98.1 F (36.7 C) (Oral)   Resp 18   Ht 5\' 4"  (1.626 m)   Wt 82.1 kg   LMP 11/07/2017   SpO2 98%   Breastfeeding   BMI 31.07 kg/m    Physical Exam:  General: NAD CV: RRR Pulm: nl effort, CTABL Lochia: moderate Uterine Fundus: fundus firm and below umbilicus DVT Evaluation: no cords, ttp LEs   Recent Labs    08/11/18 0948 08/12/18 0652  HGB 10.6* 10.7*  HCT 32.4* 32.0*  WBC 9.4 10.6*  PLT 211 217    Assessment/Plan: 27 y.o. [id number] postpartum day # 1  1. Continue routine postpartum care 2. O positive, Rubella Immune, Varicella Immune 3. TDAP last given 2018 4. Breastfeeding 5. Contraception: IUD at 6 week visit 6. Disposition: possible discharge later today pending newborn bilirubin level   Tresea Mall, CNM  [facility], [facility]

## 2018-08-13 NOTE — Lactation Note (Signed)
This note was copied from a baby's chart. Lactation Consultation Note  Patient Name: Anna Flores [facility] Date: 08/13/2018 Reason for consult: Follow-up assessment   Maternal Data    Feeding Feeding Type: Breast Fed  LATCH Score Latch: Repeated attempts needed to sustain latch, nipple held in mouth throughout feeding, stimulation needed to elicit sucking reflex.  Audible Swallowing: Spontaneous and intermittent  Type of Nipple: Inverted  Comfort (Breast/Nipple): Soft / non-tender  Hold (Positioning): Assistance needed to correctly position infant at breast and maintain latch.  LATCH Score: 6  Interventions Interventions: Assisted with latch;Adjust position;Support pillows;Position options  Lactation Tools Discussed/Used     Consult Status Consult Status: Follow-up Date: 08/13/18 Follow-up type: In-patient LC assisted with latch and positioning of infant on the right side. Mother has an inverted nipple on the right side and mother states that it is difficult to latch infant on that side. Infant seemed to do better in the football hold and sandwiching the breat on the right side. Audible swallows were heard.   Arlyss Gandy 08/13/2018, 10:54 AM

## 2018-10-27 ENCOUNTER — Emergency Department
Admission: EM | Admit: 2018-10-27 | Discharge: 2018-10-27 | Disposition: A | Discharge status: Home / Self Care | Payer: Medicaid Other | Attending: Emergency Medicine | Admitting: Emergency Medicine

## 2018-10-27 ENCOUNTER — Other Ambulatory Visit: Payer: Self-pay

## 2018-10-27 ENCOUNTER — Emergency Department: Payer: Medicaid Other

## 2018-10-27 ENCOUNTER — Encounter: Payer: Self-pay | Admitting: Emergency Medicine

## 2018-10-27 DIAGNOSIS — R0602 Shortness of breath: Secondary | ICD-10-CM | POA: Insufficient documentation

## 2018-10-27 DIAGNOSIS — R509 Fever, unspecified: Secondary | ICD-10-CM | POA: Insufficient documentation

## 2018-10-27 DIAGNOSIS — R05 Cough: Secondary | ICD-10-CM | POA: Diagnosis not present

## 2018-10-27 DIAGNOSIS — R079 Chest pain, unspecified: Secondary | ICD-10-CM | POA: Diagnosis not present

## 2018-10-27 DIAGNOSIS — R059 Cough, unspecified: Secondary | ICD-10-CM

## 2018-10-27 LAB — POCT PREGNANCY, URINE: Preg Test, Ur: NEGATIVE

## 2018-10-27 LAB — CBC
HCT: 38.6 % (ref 36.0–46.0)
HEMOGLOBIN: 13 g/dL (ref 12.0–15.0)
MCH: 28.6 pg (ref 26.0–34.0)
MCHC: 33.7 g/dL (ref 30.0–36.0)
MCV: 84.8 fL (ref 80.0–100.0)
Platelets: 298 10*3/uL (ref 150–400)
RBC: 4.55 MIL/uL (ref 3.87–5.11)
RDW: 13.9 % (ref 11.5–15.5)
WBC: 10.2 10*3/uL (ref 4.0–10.5)
nRBC: 0 % (ref 0.0–0.2)

## 2018-10-27 LAB — BASIC METABOLIC PANEL
Anion gap: 8 (ref 5–15)
BUN: 17 mg/dL (ref 6–20)
CO2: 30 mmol/L (ref 22–32)
Calcium: 9.5 mg/dL (ref 8.9–10.3)
Chloride: 100 mmol/L (ref 98–111)
Creatinine, Ser: 0.79 mg/dL (ref 0.44–1.00)
GFR calc Af Amer: >60 mL/min (ref >60)
GFR calc non Af Amer: >60 mL/min (ref >60)
Glucose, Bld: 103 mg/dL — ABNORMAL HIGH (ref 70–99)
POTASSIUM: 3.6 mmol/L (ref 3.5–5.1)
Sodium: 138 mmol/L (ref 135–145)

## 2018-10-27 LAB — GROUP A STREP BY PCR: Group A Strep by PCR: NOT DETECTED

## 2018-10-27 LAB — INFLUENZA PANEL BY PCR (TYPE A & B)
Influenza A By PCR: NEGATIVE
Influenza B By PCR: NEGATIVE

## 2018-10-27 LAB — TROPONIN I: Troponin I: <0.03 ng/mL (ref <0.03)

## 2018-10-27 MED ORDER — BENZONATATE 100 MG PO CAPS: 100.0000 mg | ORAL_CAPSULE | Freq: Four times a day (QID) | ORAL | 0 refills | Status: DC | PRN

## 2018-10-27 MED ORDER — SODIUM CHLORIDE 0.9% FLUSH: 3.0000 mL | Freq: Once | INTRAVENOUS | Status: DC

## 2018-10-27 MED ORDER — ACETAMINOPHEN 500 MG PO TABS
1000.0000 mg | ORAL_TABLET | Freq: Once | ORAL | Status: AC
  Administered 2018-10-27: 1000 mg via ORAL
  Filled 2018-10-27: qty 2
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
## 2018-10-27 NOTE — ED Notes (Signed)
No E-signature obtained d/t no computer in room, infectious process.

## 2018-10-27 NOTE — Discharge Instructions (Signed)
At this time, it is very important for you to completely isolate yourself from your community and your family, with the exception of your newborn.  Please bring home the mask you were given and wear that when you are holding the baby.  Please try to obtain more masks for when you are taking care of your baby.     Person Under Monitoring Name: Anna Flores  Location: [address] [place] [address]   Infection Prevention Recommendations for Individuals Confirmed to have, or Being Evaluated for, 2019 Novel Coronavirus (COVID-19) Infection Who Receive Care at Home  Individuals who are confirmed to have, or are being evaluated for, COVID-19 should follow the prevention steps below until a healthcare provider or local or state health department says they can return to normal activities.  Stay home except to get medical care You should restrict activities outside your home, except for getting medical care. Do not go to work, school, or public areas, and do not use public transportation or taxis.  Call ahead before visiting your doctor Before your medical appointment, call the healthcare provider and tell them that you have, or are being evaluated for, COVID-19 infection. This will help the healthcare providers office take steps to keep other people from getting infected. Ask your healthcare provider to call the local or state health department.  Monitor your symptoms Seek prompt medical attention if your illness is worsening (e.g., difficulty breathing). Before going to your medical appointment, call the healthcare provider and tell them that you have, or are being evaluated for, COVID-19 infection. Ask your healthcare provider to call the local or state health department.  Wear a facemask You should wear a facemask that covers your nose and mouth when you are in the same room with other people and when you visit a healthcare provider. People who live with or visit you should also wear a  facemask while they are in the same room with you.  Separate yourself from other people in your home As much as possible, you should stay in a different room from other people in your home. Also, you should use a separate bathroom, if available.  Avoid sharing household items You should not share dishes, drinking glasses, cups, eating utensils, towels, bedding, or other items with other people in your home. After using these items, you should wash them thoroughly with soap and water.  Cover your coughs and sneezes Cover your mouth and nose with a tissue when you cough or sneeze, or you can cough or sneeze into your sleeve. Throw used tissues in a lined trash can, and immediately wash your hands with soap and water for at least 20 seconds or use an alcohol-based hand rub.  Wash your [employer] your hands often and thoroughly with soap and water for at least 20 seconds. You can use an alcohol-based hand sanitizer if soap and water are not available and if your hands are not visibly dirty. Avoid touching your eyes, nose, and mouth with unwashed hands.   Prevention Steps for Caregivers and Household Members of Individuals Confirmed to have, or Being Evaluated for, COVID-19 Infection Being Cared for in the Home  If you live with, or provide care at home for, a person confirmed to have, or being evaluated for, COVID-19 infection please follow these guidelines to prevent infection:  Follow healthcare providers instructions Make sure that you understand and can help the patient follow any healthcare provider instructions for all care.  Provide for the patients  basic needs You should help the patient with basic needs in the home and provide support for getting groceries, prescriptions, and other personal needs.  Monitor the patients symptoms If they are getting sicker, call his or her medical provider and tell them that the patient has, or is being evaluated for, COVID-19 infection.  This will help the healthcare providers office take steps to keep other people from getting infected. Ask the healthcare provider to call the local or state health department.  Limit the number of people who have contact with the patient If possible, have only one caregiver for the patient. Other household members should stay in another home or place of residence. If this is not possible, they should stay in another room, or be separated from the patient as much as possible. Use a separate bathroom, if available. Restrict visitors who do not have an essential need to be in the home.  Keep older adults, very young children, and other sick people away from the patient Keep older adults, very young children, and those who have compromised immune systems or chronic health conditions away from the patient. This includes people with chronic heart, lung, or kidney conditions, diabetes, and cancer.  Ensure good ventilation Make sure that shared spaces in the home have good air flow, such as from an air conditioner or an opened window, weather permitting.  Wash your hands often Wash your hands often and thoroughly with soap and water for at least 20 seconds. You can use an alcohol based hand sanitizer if soap and water are not available and if your hands are not visibly dirty. Avoid touching your eyes, nose, and mouth with unwashed hands. Use disposable paper towels to dry your hands. If not available, use dedicated cloth towels and replace them when they become wet.  Wear a facemask and gloves Wear a disposable facemask at all times in the room and gloves when you touch or have contact with the patients blood, body fluids, and/or secretions or excretions, such as sweat, saliva, sputum, nasal mucus, vomit, urine, or feces.  Ensure the mask fits over your nose and mouth tightly, and do not touch it during use. Throw out disposable facemasks and gloves after using them. Do not reuse. Wash your hands  immediately after removing your facemask and gloves. If your personal clothing becomes contaminated, carefully remove clothing and launder. Wash your hands after handling contaminated clothing. Place all used disposable facemasks, gloves, and other waste in a lined container before disposing them with other household waste. Remove gloves and wash your hands immediately after handling these items.  Do not share dishes, glasses, or other household items with the patient Avoid sharing household items. You should not share dishes, drinking glasses, cups, eating utensils, towels, bedding, or other items with a patient who is confirmed to have, or being evaluated for, COVID-19 infection. After the person uses these items, you should wash them thoroughly with soap and water.  Wash laundry thoroughly Immediately remove and wash clothes or bedding that have blood, body fluids, and/or secretions or excretions, such as sweat, saliva, sputum, nasal mucus, vomit, urine, or feces, on them. Wear gloves when handling laundry from the patient. Read and follow directions on labels of laundry or clothing items and detergent. In general, wash and dry with the warmest temperatures recommended on the label.  Clean all areas the individual has used often Clean all touchable surfaces, such as counters, tabletops, doorknobs, bathroom fixtures, toilets, phones, keyboards, tablets, and bedside tables, every  day. Also, clean any surfaces that may have blood, body fluids, and/or secretions or excretions on them. Wear gloves when cleaning surfaces the patient has come in contact with. Use a diluted bleach solution (e.g., dilute bleach with 1 part bleach and 10 parts water) or a household disinfectant with a label that says EPA-registered for coronaviruses. To make a bleach solution at home, add 1 tablespoon of bleach to 1 quart (4 cups) of water. For a larger supply, add  cup of bleach to 1 gallon (16 cups) of water. Read  labels of cleaning products and follow recommendations provided on product labels. Labels contain instructions for safe and effective use of the cleaning product including precautions you should take when applying the product, such as wearing gloves or eye protection and making sure you have good ventilation during use of the product. Remove gloves and wash hands immediately after cleaning.  Monitor yourself for signs and symptoms of illness Caregivers and household members are considered close contacts, should monitor their health, and will be asked to limit movement outside of the home to the extent possible. Follow the monitoring steps for close contacts listed on the symptom monitoring form.   ? If you have additional questions, contact your local health department or call the epidemiologist on call at [phone] (available 24/7). ? This guidance is subject to change. For the most up-to-date guidance from [facility], please refer to their website: [email or web address]

## 2018-10-27 NOTE — ED Triage Notes (Signed)
Pt to ED from home c/o chest pain that started yesterday getting worse, cough and SOB x1 week, denies travel or known exposure to positive COVID pts.  Chest rise even and unlabored, in NAD at this time.

## 2018-12-04 ENCOUNTER — Telehealth: Payer: Self-pay | Admitting: Certified Nurse Midwife

## 2018-12-04 ENCOUNTER — Other Ambulatory Visit: Payer: Self-pay | Admitting: Advanced Practice Midwife

## 2018-12-04 DIAGNOSIS — N912 Amenorrhea, unspecified: Secondary | ICD-10-CM

## 2018-12-04 NOTE — Progress Notes (Signed)
Beta hcg ordered- patient has had both a positive and a negative home pregnancy test. She is approximately 4 months postpartum.

## 2018-12-04 NOTE — Telephone Encounter (Signed)
Patient is calling with positive and negative pregnancy test. Patient reports she hasn't had a mentural cycle yet. Delivered in January. Please advise lab or office visit.

## 2018-12-04 NOTE — Telephone Encounter (Signed)
I placed the lab order so you should be able to schedule her for the lab visit.

## 2018-12-07 ENCOUNTER — Other Ambulatory Visit: Payer: Medicaid Other

## 2018-12-07 ENCOUNTER — Other Ambulatory Visit: Payer: Self-pay

## 2018-12-07 DIAGNOSIS — N912 Amenorrhea, unspecified: Secondary | ICD-10-CM

## 2018-12-07 NOTE — Telephone Encounter (Signed)
Patient is schedule 12/07/18

## 2018-12-08 LAB — BETA HCG QUANT (REF LAB): hCG Quant: <1 m[IU]/mL

## 2018-12-22 IMAGING — US US OB LIMITED
2 series · 14 of 28 positions shown · non-contrast
Comparison: none

CLINICAL DATA: Vaginal bleeding.  35 weeks, 1 day.

EXAM:
LIMITED OBSTETRIC ULTRASOUND

[Series 1: us ob limited · 10 of 23 slices shown (1 of 2)]
[im 2/23]
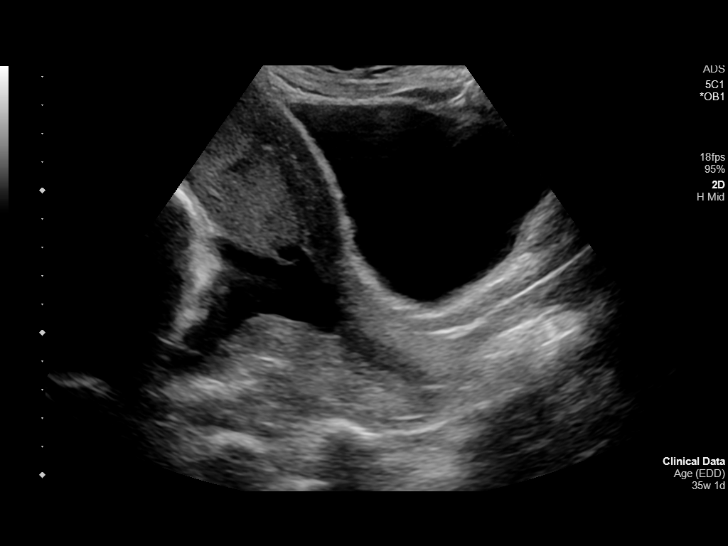
[im 4/23]
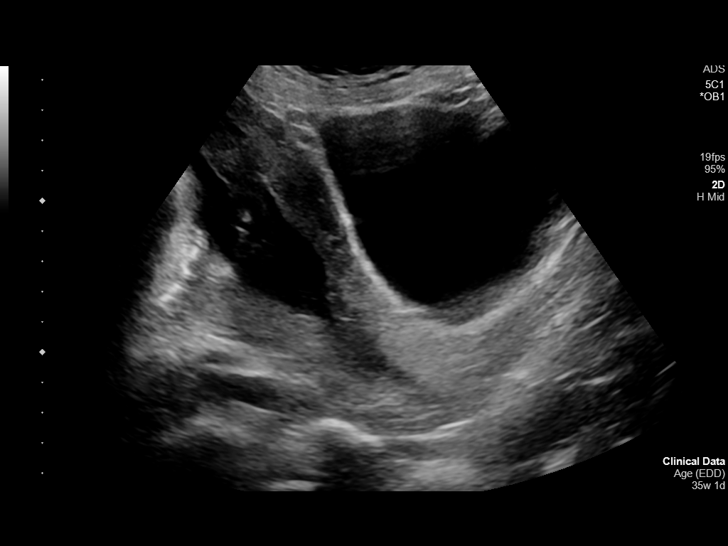
[im 6/23]
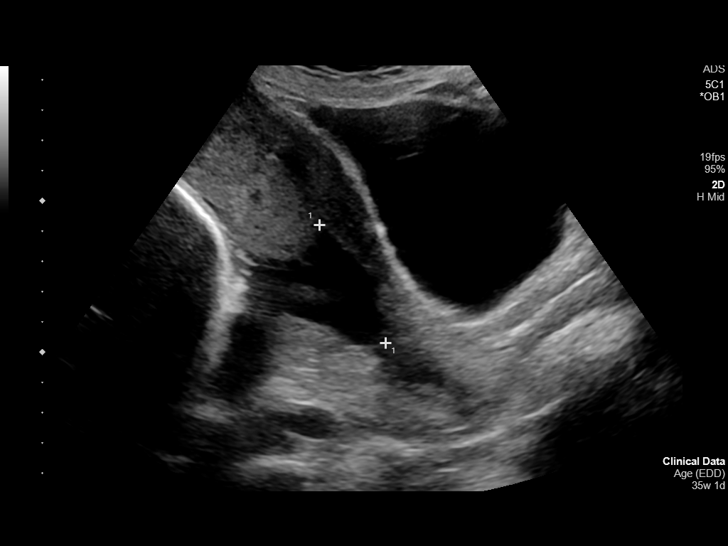
[im 8/23]
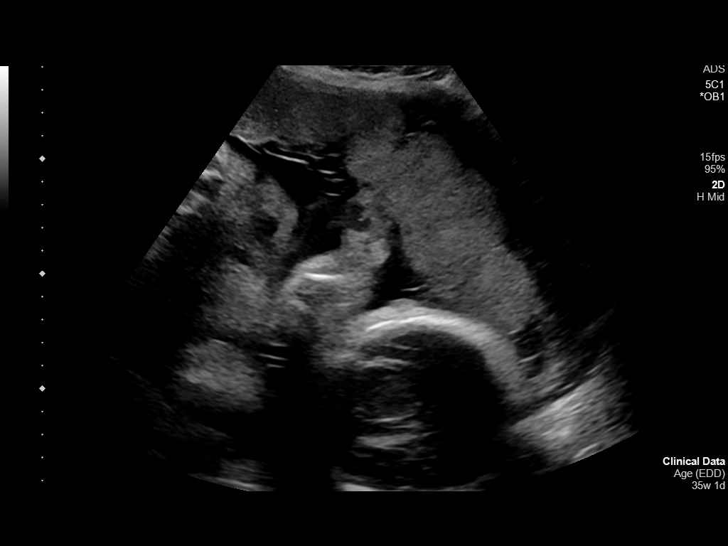
[im 10/23]
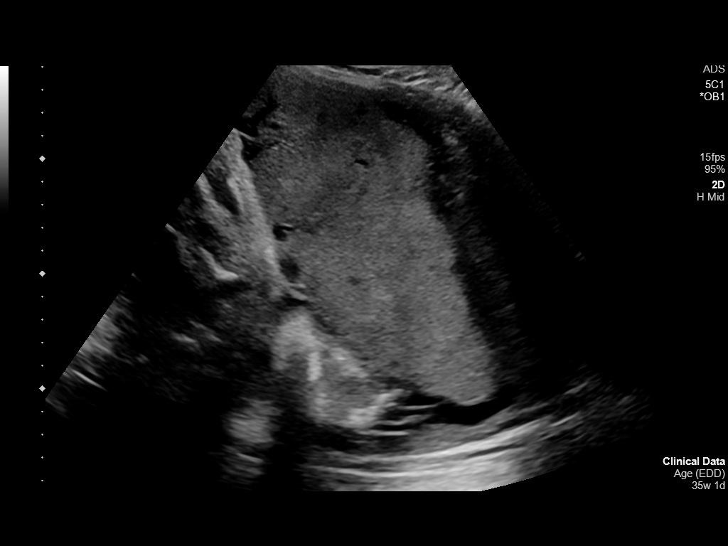
[im 13/23]
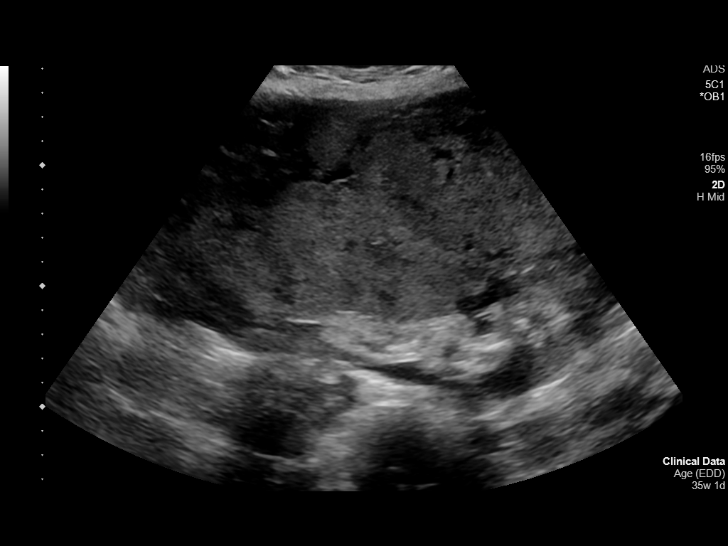
[im 15/23]
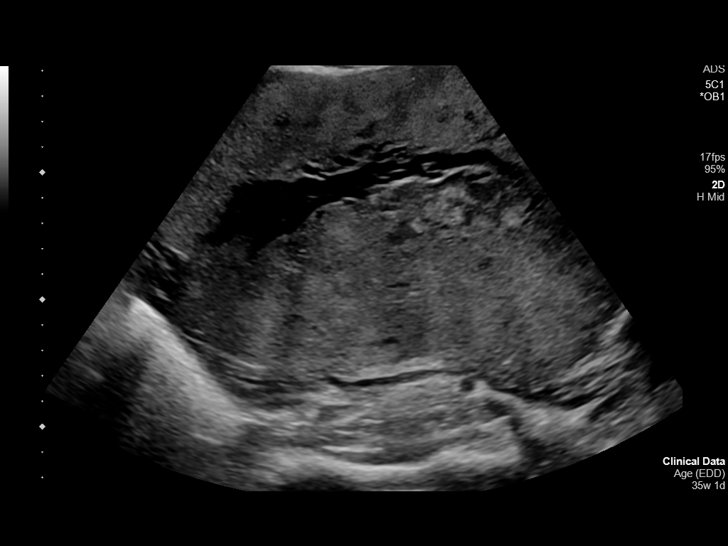
[im 17/23]
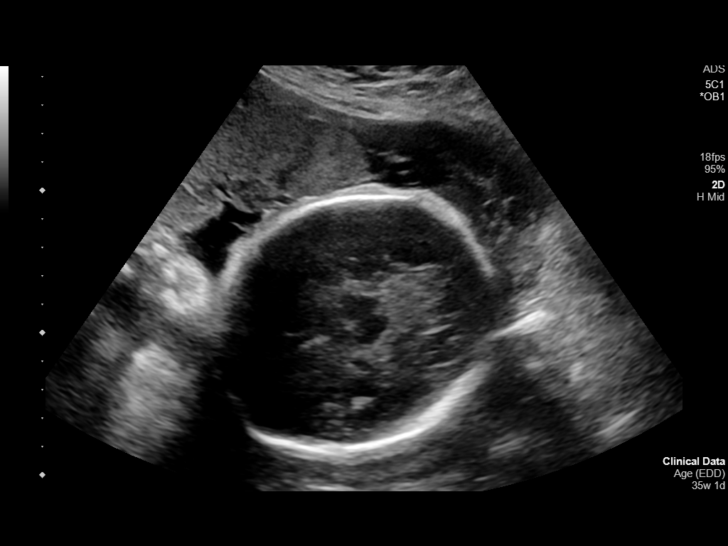
[im 19/23]
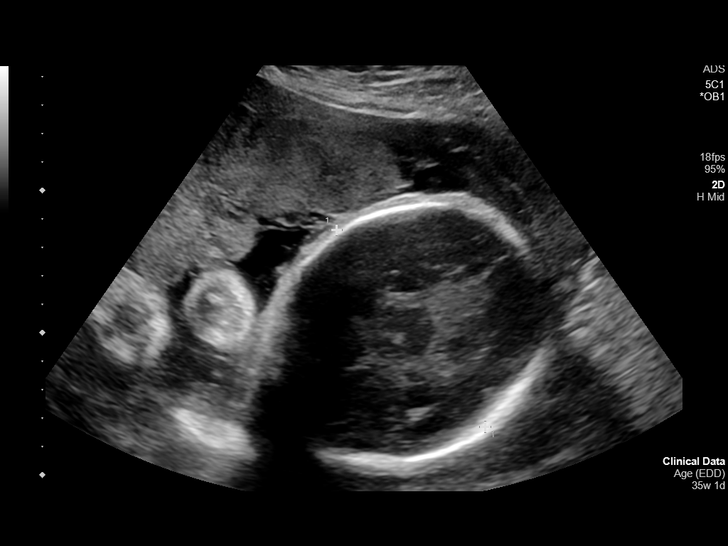
[im 21/23]
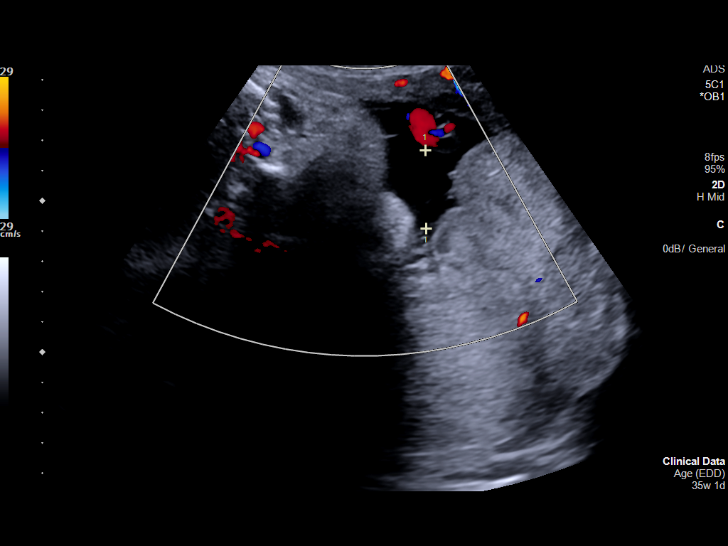

[Series 1001: us ob limited · 4 of 7 slices shown (2 of 2)]
[im 1/7]
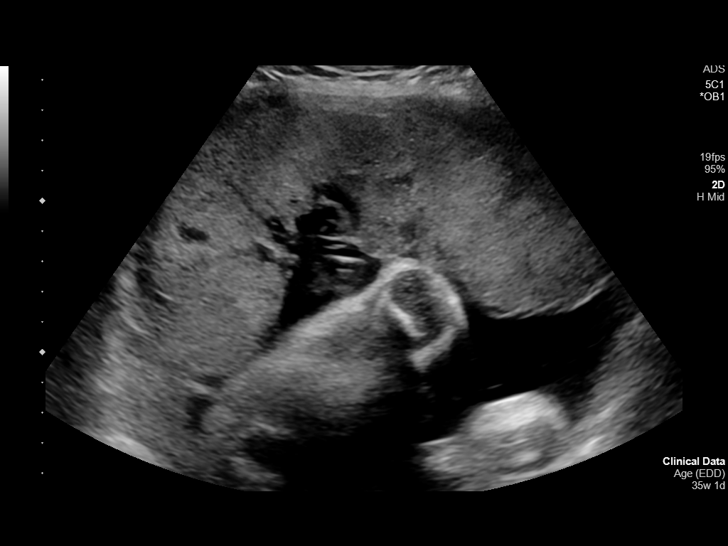
[im 3/7]
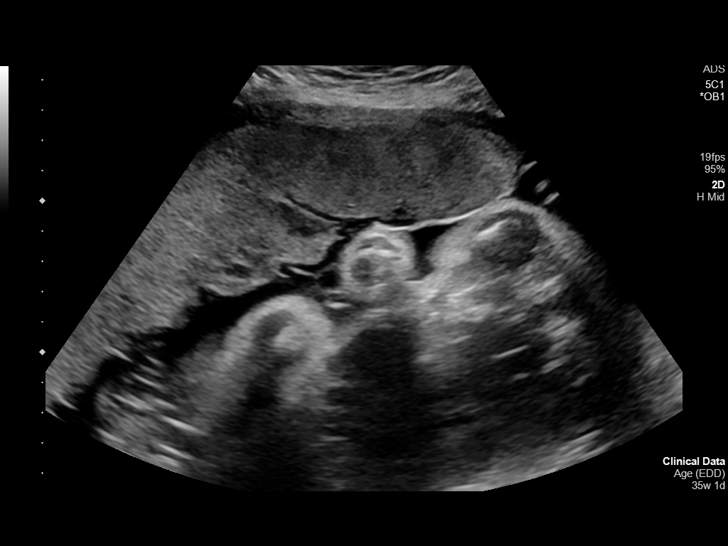
[im 5/7]
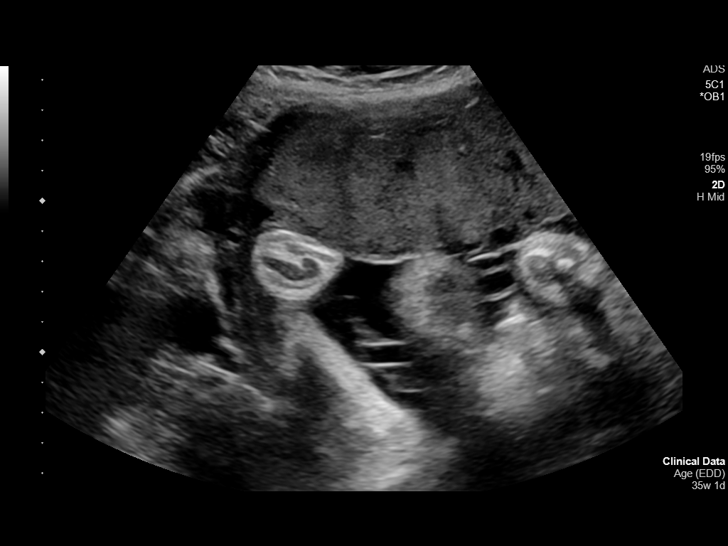
[im 7/7]
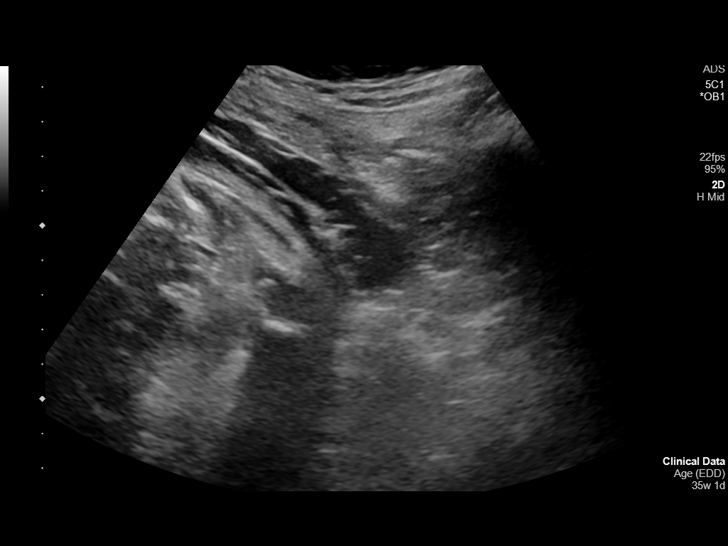

[14 of 28 positions shown; findings below may reference images not displayed]

FINDINGS: Number of Fetuses: 1

Heart Rate:  135 bpm

Movement: Yes

Presentation: Cephalic

Placental Location: Lateral Right

Previa: No

Amniotic Fluid (Subjective):  Within normal limits.

AFI: 11 cm

BPD: 8.7 cm 35 w  1 d

MATERNAL FINDINGS:

Cervix:  Appears closed.  Cervical length is 3.4 cm.

Uterus/Adnexae: No abnormality visualized.
IMPRESSION: No acute findings.

This exam is performed on an emergent basis and does not
comprehensively evaluate fetal size, dating, or anatomy; follow-up
complete OB US should be considered if further fetal assessment is
warranted.

## 2018-12-22 NOTE — Telephone Encounter (Signed)
Please advise 

## 2018-12-29 ENCOUNTER — Other Ambulatory Visit: Payer: Self-pay | Admitting: Advanced Practice Midwife

## 2018-12-29 DIAGNOSIS — N911 Secondary amenorrhea: Secondary | ICD-10-CM

## 2018-12-29 NOTE — Progress Notes (Signed)
Repeat Beta Hcg ordered- patient had another positive home pregnancy test after no intercourse since last test. She is now having some cramping and bleeding.

## 2018-12-31 ENCOUNTER — Other Ambulatory Visit: Payer: Medicaid Other

## 2019-01-05 ENCOUNTER — Other Ambulatory Visit: Payer: Medicaid Other

## 2019-01-05 ENCOUNTER — Other Ambulatory Visit: Payer: Self-pay

## 2019-01-05 ENCOUNTER — Other Ambulatory Visit: Payer: Self-pay | Admitting: Advanced Practice Midwife

## 2019-01-06 LAB — BETA HCG QUANT (REF LAB): hCG Quant: <1 m[IU]/mL

## 2019-01-08 ENCOUNTER — Other Ambulatory Visit: Payer: Self-pay | Admitting: Advanced Practice Midwife

## 2019-01-08 DIAGNOSIS — N912 Amenorrhea, unspecified: Secondary | ICD-10-CM

## 2019-01-08 NOTE — Progress Notes (Signed)
Order placed for pelvic ultrasound to rule out uterus or ovary complications related to patient's amenorrhea 5 months postpartum with positive pregnancy tests at home and negative beta hcg results in office. Patient notified to schedule exam with follow up

## 2019-01-27 ENCOUNTER — Other Ambulatory Visit: Payer: Medicaid Other

## 2019-01-27 ENCOUNTER — Ambulatory Visit: Payer: Medicaid Other | Admitting: Advanced Practice Midwife

## 2019-02-04 ENCOUNTER — Other Ambulatory Visit: Payer: Self-pay

## 2019-02-04 ENCOUNTER — Encounter: Payer: Self-pay | Admitting: Emergency Medicine

## 2019-02-04 ENCOUNTER — Telehealth: Payer: Self-pay

## 2019-02-04 ENCOUNTER — Emergency Department: Payer: Medicaid Other

## 2019-02-04 ENCOUNTER — Emergency Department
Admission: EM | Admit: 2019-02-04 | Discharge: 2019-02-04 | Disposition: A | Discharge status: Home / Self Care | Payer: Medicaid Other | Attending: Emergency Medicine | Admitting: Emergency Medicine

## 2019-02-04 DIAGNOSIS — Z79899 Other long term (current) drug therapy: Secondary | ICD-10-CM | POA: Insufficient documentation

## 2019-02-04 DIAGNOSIS — N939 Abnormal uterine and vaginal bleeding, unspecified: Secondary | ICD-10-CM | POA: Diagnosis not present

## 2019-02-04 DIAGNOSIS — R102 Pelvic and perineal pain: Secondary | ICD-10-CM | POA: Diagnosis present

## 2019-02-04 LAB — POCT PREGNANCY, URINE: Preg Test, Ur: NEGATIVE

## 2019-02-04 LAB — CBC WITH DIFFERENTIAL/PLATELET
Abs Immature Granulocytes: 0.02 10*3/uL (ref 0.00–0.07)
Basophils Absolute: 0 10*3/uL (ref 0.0–0.1)
Basophils Relative: 0 %
Eosinophils Absolute: 0 10*3/uL (ref 0.0–0.5)
Eosinophils Relative: 1 %
HCT: 40.6 % (ref 36.0–46.0)
Hemoglobin: 13.4 g/dL (ref 12.0–15.0)
Immature Granulocytes: 0 %
Lymphocytes Relative: 28 %
Lymphs Abs: 1.7 10*3/uL (ref 0.7–4.0)
MCH: 28.8 pg (ref 26.0–34.0)
MCHC: 33 g/dL (ref 30.0–36.0)
MCV: 87.3 fL (ref 80.0–100.0)
Monocytes Absolute: 0.4 10*3/uL (ref 0.1–1.0)
Monocytes Relative: 6 %
Neutro Abs: 4 10*3/uL (ref 1.7–7.7)
Neutrophils Relative %: 65 %
Platelets: 296 10*3/uL (ref 150–400)
RBC: 4.65 MIL/uL (ref 3.87–5.11)
RDW: 12.4 % (ref 11.5–15.5)
WBC: 6.2 10*3/uL (ref 4.0–10.5)
nRBC: 0 % (ref 0.0–0.2)

## 2019-02-04 LAB — BASIC METABOLIC PANEL
Anion gap: 8 (ref 5–15)
BUN: 11 mg/dL (ref 6–20)
CO2: 23 mmol/L (ref 22–32)
Calcium: 9 mg/dL (ref 8.9–10.3)
Chloride: 107 mmol/L (ref 98–111)
Creatinine, Ser: 0.66 mg/dL (ref 0.44–1.00)
GFR calc Af Amer: >60 mL/min (ref >60)
GFR calc non Af Amer: >60 mL/min (ref >60)
Glucose, Bld: 104 mg/dL — ABNORMAL HIGH (ref 70–99)
Potassium: 4.2 mmol/L (ref 3.5–5.1)
Sodium: 138 mmol/L (ref 135–145)

## 2019-02-04 LAB — T4, FREE: Free T4: 0.85 ng/dL (ref 0.61–1.12)

## 2019-02-04 LAB — TSH: TSH: 1.412 u[IU]/mL (ref 0.350–4.500)

## 2019-02-04 LAB — HCG, QUANTITATIVE, PREGNANCY: hCG, Beta Chain, Quant, S: <1 m[IU]/mL (ref <5)

## 2019-02-04 MED ORDER — FERROUS SULFATE 325 (65 FE) MG PO TBEC: 325.0000 mg | DELAYED_RELEASE_TABLET | Freq: Every day | ORAL | 0 refills | Status: DC

## 2019-02-04 MED ORDER — IBUPROFEN 600 MG PO TABS: 600.0000 mg | ORAL_TABLET | Freq: Three times a day (TID) | ORAL | 0 refills | Status: DC | PRN

## 2019-02-04 MED ORDER — NORETHINDRONE ACETATE 5 MG PO TABS: 10.0000 mg | ORAL_TABLET | Freq: Every day | ORAL | 0 refills | Status: DC

## 2019-02-04 NOTE — ED Notes (Signed)
Heavy vaginal bleeding X 3 days. intermittent positive home pregnancy test. Last gave birth in January. Denies pain.

## 2019-02-04 NOTE — ED Notes (Signed)
Patient transported to Ultrasound 

## 2019-02-04 NOTE — ED Notes (Signed)
Pt alert and oriented X4, active, cooperative, pt in NAD. RR even and unlabored, color WNL.  Pt informed to return if any life threatening symptoms occur.  Discharge and followup instructions reviewed. Ambulates safely. 
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
## 2019-02-04 NOTE — ED Triage Notes (Signed)
Says she has been seeing the gyn doctor--having positive preg tests on and off.  So they did a blood test which was negative.  Says now she has been having heavy vaginal bleeding for  2 days.

## 2019-02-04 NOTE — Telephone Encounter (Signed)
Pt calling; has had two blood tests since March and still doesn't know if preg or not; has had no regualar period since March;  3d ago started bleeding heavily going thru large pad and tampon in 1-1 1/2 hrs; this am when she got up the blood just ran down her leg.  [phone]  Adv pt if bleeding that heavily it is our policy that she go to the ED, wear a mask.

## 2019-02-04 NOTE — ED Notes (Signed)
Called Lab and per [place] they will add on lab test to blood already in lab.

## 2019-02-04 NOTE — Discharge Instructions (Signed)
We are going to start you on a birth control, which should help your bleeding.  Take this for 10 days, after which you have a normal period.  Follow-up with your OB in the next 2 weeks

## 2019-02-09 ENCOUNTER — Ambulatory Visit: Payer: Self-pay | Admitting: Licensed Clinical Social Worker

## 2019-02-09 ENCOUNTER — Telehealth: Payer: Self-pay | Admitting: Licensed Clinical Social Worker

## 2019-02-09 NOTE — Telephone Encounter (Signed)
Attempted call to patient for scheduled phone-based session. LCSW called patient two times - 1st call left vm informing pt. reason for call was for scheduled appt. 2nd call left vm stating reason for call and encouraged patient to return call.   No further attempts will be made at this time.

## 2019-03-07 LAB — FETAL NONSTRESS TEST

## 2019-03-12 ENCOUNTER — Other Ambulatory Visit: Payer: Self-pay | Admitting: Advanced Practice Midwife

## 2019-03-12 DIAGNOSIS — Z30011 Encounter for initial prescription of contraceptive pills: Secondary | ICD-10-CM

## 2019-03-12 MED ORDER — NORETHIN ACE-ETH ESTRAD-FE 1-20 MG-MCG PO TABS: 1.0000 | ORAL_TABLET | Freq: Every day | ORAL | 4 refills | Status: DC

## 2019-03-12 NOTE — Progress Notes (Unsigned)
Rx Junel (combined OCP) sent to patient pharmacy.

## 2019-04-13 ENCOUNTER — Telehealth: Payer: Self-pay | Admitting: Licensed Clinical Social Worker

## 2019-04-13 NOTE — Telephone Encounter (Signed)
04/09/2019 Patient left vm requesting appointment.

## 2019-04-14 ENCOUNTER — Ambulatory Visit: Payer: Self-pay | Admitting: Licensed Clinical Social Worker

## 2019-04-14 DIAGNOSIS — F331 Major depressive disorder, recurrent, moderate: Secondary | ICD-10-CM

## 2019-04-14 DIAGNOSIS — F411 Generalized anxiety disorder: Secondary | ICD-10-CM

## 2019-04-14 NOTE — Progress Notes (Signed)
Counselor/Therapist Progress Note  Patient ID: Anna Flores, MRN: [id number],    Date: 04/14/2019  Time Spent: 45 minutes   Treatment Type: Psychotherapy  Reported Symptoms: Sleep disturbance, Passive suicidal and depressed mood, last thoughts about death 1 week ago, anxious thoughts, racing thoughts   Mental Status Exam:   Appearance:   NA     Behavior:  Appropriate and Sharing  Motor:  na  Speech/Language:   Normal Rate  Affect:  NA  Mood:  normal  Thought process:  normal  Thought content:    WNL  Sensory/Perceptual disturbances:    WNL  Orientation:  oriented to person, place and time/date  Attention:  Good  Concentration:  Good  Memory:  WNL  Fund of knowledge:   Good  Insight:    Good  Judgment:   Good  Impulse Control:  Good   Risk Assessment: Danger to Self:  No Self-injurious Behavior: No Danger to Others: No Duty to Warn:no Physical Aggression / Violence:No  Access to Firearms a concern: No  Gang Involvement:No   Subjective: Patient was engaged and cooperative throughout the session using time effectively to discuss thoughts, feelings, symptoms, and plans for continued Treatment. Patient shares that she is not currently on meds but had recently been on Zoloft (prescribed by [facility]) and reports no benefit and found that it worsened anxiety symptoms, and increased racing thoughts.   Patient voices continued motivation for treatment and understanding of the importance of following through with [facility] for future services (patient does not want to return to [facility]). Patient is likely to benefit from future treatment because she remains motivated to decrease symptoms and improve functioning.   Interventions: Cognitive Behavioral Therapy and breif assessment; develop plan for future treatment  Established psychological safety. Checked in with patient. Assessed current symptoms and psychosocial stressors. Assessed patient for safety - patient denies any current suicidal  ideation, denies plan, or intent. Provided supportive space encouraging emotional release and processing of current psychosocial stressors. Assisted patient in identifying plans for future treatment. Provided support through active listening, validation of feelings, and highlighted patient's strengths.    Diagnosis:   ICD-10-CM   1. Generalized anxiety disorder  F41.1   2. Major depressive disorder, recurrent episode, moderate ([place])  F33.1     Plan: Patient to follow up with [facility] for future services. LCSW and patient scheduled appointment for one week to follow up on transition of care.    Interpreter used: NA   Milton Ferguson, LCSW

## 2019-04-20 ENCOUNTER — Ambulatory Visit (INDEPENDENT_AMBULATORY_CARE_PROVIDER_SITE_OTHER): Payer: Medicaid Other | Admitting: Maternal Newborn

## 2019-04-20 ENCOUNTER — Other Ambulatory Visit: Payer: Self-pay

## 2019-04-20 ENCOUNTER — Encounter: Payer: Self-pay | Admitting: Maternal Newborn

## 2019-04-20 VITALS — BP 120/80 | Ht 65.0 in | Wt 203.0 lb

## 2019-04-20 DIAGNOSIS — N912 Amenorrhea, unspecified: Secondary | ICD-10-CM

## 2019-04-20 NOTE — Progress Notes (Addendum)
Obstetrics & Gynecology Office Visit   Chief Complaint: Amenorrhea with positive pregnancy tests, history of negative beta hcG tests in past months.  History of Present Illness: Anna Flores had a vaginal delivery this year on 08/12/2018. At around 4 months postpartum, she had not had a menstrual cycle, and took several home pregnancy tests and got both positive and negative results. A beta hcG test done on 12/07/2018 was negative. She did have some vaginal spotting after that test. In June she again had amenorrhea with positive pregnancy tests and a negative beta hCG on 01/05/2019. Patient was to come in to the office for follow-up with pelvic ultrasound but did not. With the onset of heavy vaginal bleeding in July, she had a visit to the ED on 02/04/2019 and had an essentially normal pelvic ultrasound. She also had a repeat beta hCG at this time, which was negative. Thyroid labs were ordered and results were normal.  She had a period at the beginning of August lasting 3-4 days. She was prescribed OCPs at her request on 03/12/2019 and did not begin taking the pills. She has not had a period in September. She has taken several home pregnancy tests with positive results since her missed period in September. No other complaints except for fatigue and headaches.   Review of Systems: Review of systems negative unless otherwise noted in HPI.  Past Medical History:  Past Medical History:  Diagnosis Date  . Abdominal pain 10/26/2013  . Anemia 2013  . Anxiety   . Depression   . History of Papanicolaou smear of cervix 10/02/2016   NEG  . Menometrorrhagia 10/26/2013  . Pregnancy    DELIVERED 08/12/16 - [facility]    Past Surgical History:  Past Surgical History:  Procedure Laterality Date  . DIAGNOSTIC LAPAROSCOPY  2018   suspected ectopic pregnancy-[facility]  . TONSILLECTOMY      Gynecologic History: Patient's last menstrual period was 03/16/2019.  Obstetric History: [id number]  Family History:  Family History   Adopted: Yes  Problem Relation Age of Onset  . Nevi Sister        DISPLASTIC NEVUS    Social History:  Social History   Socioeconomic History  . Marital status: Married    Spouse name: Lysbeth Galas   . Number of children: 3  . Years of education: 3  . Highest education level: Not on file  Occupational History  . Occupation: HOMEMAKER  Social Needs  . Financial resource strain: Not hard at all  . Food insecurity    Worry: Never true    Inability: Never true  . Transportation needs    Medical: No    Non-medical: No  Tobacco Use  . Smoking status: Never Smoker  . Smokeless tobacco: Never Used  Substance and Sexual Activity  . Alcohol use: No  . Drug use: No  . Sexual activity: Yes    Birth control/protection: None  Lifestyle  . Physical activity    Days per week: 4 days    Minutes per session: 30 min  . Stress: Rather much  Relationships  . Social connections    Talks on phone: More than three times a week    Gets together: More than three times a week    Attends religious service: Never    Active member of club or organization: Yes    Attends meetings of clubs or organizations: Never    Relationship status: Married  . Intimate partner violence    Fear of current or ex  partner: Not on file    Emotionally abused: Not on file    Physically abused: Not on file    Forced sexual activity: Not on file  Other Topics Concern  . Not on file  Social History Narrative  . Not on file    Allergies:  Allergies  Allergen Reactions  . Amoxicillin Hives  . Hydrocodone Shortness Of Breath and Other (See Comments)    "loss of consciousness"; "felt really weird" when she was given 10 mgm hydrocodone. Did well with 5 mgm tablet  . Cherry Hives    Medications: Prior to Admission medications   Medication Sig Start Date End Date Taking? Authorizing Provider  acetaminophen (TYLENOL) 500 MG tablet Take 2 tablets (1,000 mg total) by mouth every 8 (eight) hours as needed for  moderate pain. Patient not taking: Reported on 04/20/2019 06/07/18   Conard NovakJackson, Stephen D, MD  benzonatate (TESSALON PERLES) 100 MG capsule Take 1 capsule (100 mg total) by mouth every 6 (six) hours as needed. Patient not taking: Reported on 04/20/2019 10/27/18   Rockne MenghiniNorman, Anne-Caroline, MD  diphenhydrAMINE HCl, Sleep, (UNISOM SLEEPGELS) 50 MG CAPS Take 1 capsule by mouth at bedtime as needed.    [provider]  ferrous sulfate 325 (65 FE) MG EC tablet Take 1 tablet (325 mg total) by mouth daily with breakfast. 02/04/19 03/06/19  Shaune PollackIsaacs, Cameron, MD  ibuprofen (ADVIL) 600 MG tablet Take 1 tablet (600 mg total) by mouth every 8 (eight) hours as needed for moderate pain (shoulder pain). Patient not taking: Reported on 04/20/2019 02/04/19   Shaune PollackIsaacs, Cameron, MD  norethindrone (AYGESTIN) 5 MG tablet Take 2 tablets (10 mg total) by mouth daily for 10 days. 02/04/19 02/14/19  Shaune PollackIsaacs, Cameron, MD  norethindrone-ethinyl estradiol (LOESTRIN FE) 1-20 MG-MCG tablet Take 1 tablet by mouth daily. Patient not taking: Reported on 04/20/2019 03/12/19 03/11/20  Tresea MallGledhill, [name], CNM  omeprazole (PRILOSEC) 20 MG capsule Take 1 capsule (20 mg total) by mouth daily. Patient not taking: Reported on 04/20/2019 06/24/18   Vena AustriaStaebler, Andreas, MD    Physical Exam Vitals:  Vitals:   04/20/19 0852  BP: 120/80   Patient's last menstrual period was 03/16/2019.  General: NAD HEENT: normocephalic, anicteric Pulmonary: No increased work of breathing Neurologic: Grossly intact Psychiatric: mood appropriate, affect full  Assessment: 28 y.o. [id number] with amenorrhea, complicated by history of positive urine pregnancy tests with negative beta hcG results three times since May.  Plan: Problem List Items Addressed This Visit    None    Visit Diagnoses    Amenorrhea    -  Primary   Relevant Orders   Beta HCG, Quant     Will obtain beta hCG today. If results correspond with early pregnancy, patient will schedule an new OB visit.   Marcelyn BruinsJacelyn , CNM 04/20/2019  9:04 AM

## 2019-04-21 ENCOUNTER — Encounter: Payer: Self-pay | Admitting: Maternal Newborn

## 2019-04-21 ENCOUNTER — Ambulatory Visit: Payer: Medicaid Other | Admitting: Licensed Clinical Social Worker

## 2019-04-21 DIAGNOSIS — F411 Generalized anxiety disorder: Secondary | ICD-10-CM

## 2019-04-21 DIAGNOSIS — F331 Major depressive disorder, recurrent, moderate: Secondary | ICD-10-CM

## 2019-04-21 LAB — BETA HCG QUANT (REF LAB): hCG Quant: 1413 m[IU]/mL

## 2019-04-21 NOTE — Progress Notes (Signed)
Counselor/Therapist Progress Note  Patient ID: Anna Flores, MRN: [id number],    Date: 04/21/2019  Time Spent: 40 minutes    Treatment Type: Psychotherapy  Reported Symptoms: Fatigue and increased mood stability   Mental Status Exam:   Appearance:   NA     Behavior:  Appropriate and Sharing  Motor:  Normal  Speech/Language:   Normal Rate  Affect:  NA  Mood:  normal  Thought process:  normal  Thought content:    WNL  Sensory/Perceptual disturbances:    WNL  Orientation:  oriented to person, place and time/date  Attention:  Good  Concentration:  Good  Memory:  WNL  Fund of knowledge:   Good  Insight:    Good  Judgment:   Good  Impulse Control:  Good   Risk Assessment: Danger to Self:  No Self-injurious Behavior: No Danger to Others: No Duty to Warn:no Physical Aggression / Violence:No  Access to Firearms a concern: No  Gang Involvement:No   Subjective: Patient was engaged and cooperative throughout the session using time effectively to discuss thoughts and feelings.  Patient voices continued motivation for treatment and understanding of mood and anxiety issues being impacted by relationship challenges. Patient is likely to benefit from future treatment because she remains motivated to decrease anxiety and depression and reports benefit of regular sessions in addressing these symptoms.   Interventions: Cognitive Behavioral Therapy and Mindfulness Meditation  Established psychological safety. Explored patient's perception of relationship conflict with family member whom is living with her and her family, highlighting unhelpful thoughts and reframing these thoughts. Engaged patient in mindfulness exercise, processed exercise, and contracted with patient to practice daily. Patient has next appointment for psyciatry September 29th with Dr. Jamse Arn at [facility]. Provided support through active listening, validation of feelings, and highlighted patient's strengths.    Diagnosis:  ICD-10-CM   1. Generalized anxiety disorder  F41.1   2. Major depressive disorder, recurrent episode, moderate (HCC)  F33.1     Plan: Patient has next appointment for psyciatry September 29th with Dr. Jamse Arn at [facility].  Continue CBTs and meeting on a every two weeks cadence until patient is able to transfer care to her desired location of [place].   Interpreter used: NA   Milton Ferguson, LCSW

## 2019-04-27 ENCOUNTER — Other Ambulatory Visit: Payer: Self-pay | Admitting: Advanced Practice Midwife

## 2019-04-27 DIAGNOSIS — O219 Vomiting of pregnancy, unspecified: Secondary | ICD-10-CM

## 2019-04-27 MED ORDER — ONDANSETRON 4 MG PO TBDP: 4.0000 mg | ORAL_TABLET | Freq: Four times a day (QID) | ORAL | 0 refills | Status: DC | PRN

## 2019-04-27 NOTE — Telephone Encounter (Signed)
Pt aware.

## 2019-04-27 NOTE — Progress Notes (Unsigned)
Rx zofran sent to pharmacy.

## 2019-05-03 ENCOUNTER — Other Ambulatory Visit: Payer: Self-pay | Admitting: Obstetrics & Gynecology

## 2019-05-03 ENCOUNTER — Other Ambulatory Visit: Payer: Self-pay

## 2019-05-03 ENCOUNTER — Encounter: Payer: Self-pay | Admitting: Obstetrics & Gynecology

## 2019-05-03 ENCOUNTER — Other Ambulatory Visit (HOSPITAL_COMMUNITY)
Admission: RE | Admit: 2019-05-03 | Discharge: 2019-05-03 | Disposition: A | Discharge status: Home / Self Care | Payer: Medicaid Other | Source: Ambulatory Visit | Attending: Obstetrics & Gynecology | Admitting: Obstetrics & Gynecology

## 2019-05-03 ENCOUNTER — Ambulatory Visit (INDEPENDENT_AMBULATORY_CARE_PROVIDER_SITE_OTHER): Payer: Medicaid Other | Admitting: Obstetrics & Gynecology

## 2019-05-03 VITALS — BP 120/80 | Wt 198.0 lb

## 2019-05-03 DIAGNOSIS — O99211 Obesity complicating pregnancy, first trimester: Secondary | ICD-10-CM | POA: Diagnosis not present

## 2019-05-03 DIAGNOSIS — Z124 Encounter for screening for malignant neoplasm of cervix: Secondary | ICD-10-CM

## 2019-05-03 DIAGNOSIS — O219 Vomiting of pregnancy, unspecified: Secondary | ICD-10-CM | POA: Diagnosis not present

## 2019-05-03 DIAGNOSIS — Z3A01 Less than 8 weeks gestation of pregnancy: Secondary | ICD-10-CM

## 2019-05-03 DIAGNOSIS — O99210 Obesity complicating pregnancy, unspecified trimester: Secondary | ICD-10-CM

## 2019-05-03 DIAGNOSIS — O0990 Supervision of high risk pregnancy, unspecified, unspecified trimester: Secondary | ICD-10-CM

## 2019-05-03 DIAGNOSIS — O0991 Supervision of high risk pregnancy, unspecified, first trimester: Secondary | ICD-10-CM | POA: Diagnosis not present

## 2019-05-03 MED ORDER — ONDANSETRON 4 MG PO TBDP: 4.0000 mg | ORAL_TABLET | Freq: Four times a day (QID) | ORAL | 0 refills | Status: DC | PRN

## 2019-05-03 MED ORDER — METOCLOPRAMIDE HCL 10 MG PO TABS: 10.0000 mg | ORAL_TABLET | Freq: Three times a day (TID) | ORAL | 2 refills | Status: DC

## 2019-05-03 MED ORDER — VITAMIN B-6 25 MG PO TABS: 25.0000 mg | ORAL_TABLET | Freq: Three times a day (TID) | ORAL | 2 refills | Status: DC

## 2019-05-03 NOTE — Progress Notes (Signed)
05/03/2019   Chief Complaint: Missed period  Transfer of Care Patient: no  History of Present Illness: Anna Flores is a 28 y.o. H8I5027 [redacted]w[redacted]d based on Patient's last menstrual period was 03/18/2019. with an Estimated Date of Delivery: 12/23/19, with the above CC.   Her periods were: irregular with some pos and neg preg tests over the last  Few months She was using no method when she conceived.  She has Positive signs or symptoms of nausea/vomiting of pregnancy. She has Negative signs or symptoms of miscarriage or preterm labor She identifies Negative Zika risk factors for her and her partner On any different medications around the time she conceived/early pregnancy: No  History of varicella: Yes   ROS: A 12-point review of systems was performed and negative, except as stated in the above HPI.  OBGYN History: As per HPI. OB History  Gravida Para Term Preterm AB Living  5 4 4     4   SAB TAB Ectopic Multiple Live Births        0 4    # Outcome Date GA Lbr Len/2nd Weight Sex Delivery Anes PTL Lv  5 Current           4 Term 08/12/18 [redacted]w[redacted]d / 00:02 5 lb 5.4 oz (2.42 kg) M Vag-Spont None  LIV  3 Term 08/12/16 [redacted]w[redacted]d / 00:23 7 lb 15.6 oz (3.618 kg) M Vag-Spont EPI  LIV  2 Term 11/18/14 [redacted]w[redacted]d  7 lb (3.175 kg) M Vag-Spont  N LIV  1 Term 02/09/13 [redacted]w[redacted]d  8 lb 6 oz (3.799 kg) M Vag-Spont  N LIV    Any issues with any prior pregnancies: GDM w 3rd pregnancy; obesity Any prior children are healthy, doing well, without any problems or issues: yes History of pap smears: Yes. Last pap smear 2018. Abnormal: no  History of STIs: No   Past Medical History: Past Medical History:  Diagnosis Date  . Abdominal pain 10/26/2013  . Anemia 2013  . Anxiety   . Depression   . History of Papanicolaou smear of cervix 10/02/2016   NEG  . Menometrorrhagia 10/26/2013  . Pregnancy    DELIVERED 08/12/16 - [facility]    Past Surgical History: Past Surgical History:  Procedure Laterality Date  . DIAGNOSTIC  LAPAROSCOPY  2018   suspected ectopic pregnancy-[facility]  . TONSILLECTOMY      Family History:  Family History  Adopted: Yes  Problem Relation Age of Onset  . Nevi Sister        DISPLASTIC NEVUS   She denies any female cancers, bleeding or blood clotting disorders.  She denies any history of mental retardation, birth defects or genetic disorders in her or the FOB's history  Social History:  Social History   Socioeconomic History  . Marital status: Married    Spouse name: 2019   . Number of children: 3  . Years of education: 3  . Highest education level: Not on file  Occupational History  . Occupation: HOMEMAKER  Social Needs  . Financial resource strain: Not hard at all  . Food insecurity    Worry: Never true    Inability: Never true  . Transportation needs    Medical: No    Non-medical: No  Tobacco Use  . Smoking status: Never Smoker  . Smokeless tobacco: Never Used  Substance and Sexual Activity  . Alcohol use: No  . Drug use: No  . Sexual activity: Yes    Birth control/protection: None  Lifestyle  . Physical  activity    Days per week: 4 days    Minutes per session: 30 min  . Stress: Rather much  Relationships  . Social connections    Talks on phone: More than three times a week    Gets together: More than three times a week    Attends religious service: Never    Active member of club or organization: Yes    Attends meetings of clubs or organizations: Never    Relationship status: Married  . Intimate partner violence    Fear of current or ex partner: Not on file    Emotionally abused: Not on file    Physically abused: Not on file    Forced sexual activity: Not on file  Other Topics Concern  . Not on file  Social History Narrative  . Not on file   Any pets in the household: no  Allergy: Allergies  Allergen Reactions  . Amoxicillin Hives  . Hydrocodone Shortness Of Breath and Other (See Comments)    "loss of consciousness"; "felt really weird"  when she was given 10 mgm hydrocodone. Did well with 5 mgm tablet  . Cherry Hives    Current Outpatient Medications:  Current Outpatient Medications:  .  ondansetron (ZOFRAN ODT) 4 MG disintegrating tablet, Take 1 tablet (4 mg total) by mouth every 6 (six) hours as needed for nausea., Disp: 20 tablet, Rfl: 0 .  diphenhydrAMINE HCl, Sleep, (UNISOM SLEEPGELS) 50 MG CAPS, Take 1 capsule by mouth at bedtime as needed., Disp: , Rfl:    Physical Exam:   BP 120/80   Wt 198 lb (89.8 kg)   LMP 03/18/2019   BMI 32.95 kg/m  Body mass index is 32.95 kg/m. Constitutional: Well nourished, well developed female in no acute distress.  Neck:  Supple, normal appearance, and no thyromegaly  Cardiovascular: S1, S2 normal, no murmur, rub or gallop, regular rate and rhythm Respiratory:  Clear to auscultation bilateral. Normal respiratory effort Abdomen: positive bowel sounds and no masses, hernias; diffusely non tender to palpation, non distended Breasts: breasts appear normal, no suspicious masses, no skin or nipple changes or axillary nodes. Neuro/Psych:  Normal mood and affect.  Skin:  Warm and dry.  Lymphatic:  No inguinal lymphadenopathy.   Pelvic exam: is not limited by body habitus EGBUS: within normal limits, Vagina: within normal limits and with no blood in the vault, Cervix: normal appearing cervix without discharge or lesions, closed/long/high, Uterus:  enlarged: 6 weeks, and Adnexa:  normal adnexa  Assessment: Anna Flores is a 28 y.o. Z6X0960 [redacted]w[redacted]d based on Patient's last menstrual period was 03/18/2019. with an Estimated Date of Delivery: 12/23/19,  for prenatal care.   ICD-10-CM   1. [redacted] weeks gestation of pregnancy  Z3A.01   2. Obesity affecting pregnancy, antepartum  O99.210   3. Supervision of high risk pregnancy, antepartum  O09.90 Urine Culture    RPR+Rh+ABO+Rub Ab+Ab Scr+CB...  4. Screening for cervical cancer  Z12.4 Cytology - PAP  5. Nausea and vomiting during pregnancy  O21.9  ondansetron (ZOFRAN ODT) 4 MG disintegrating tablet    metoCLOPramide (REGLAN) 10 MG tablet    vitamin B-6 (PYRIDOXINE) 25 MG tablet    Plan:  1) Avoid alcoholic beverages. 2) Patient encouraged not to smoke.  3) Discontinue the use of all non-medicinal drugs and chemicals.  4) Take prenatal vitamins daily.  5) Seatbelt use advised 6) Nutrition, food safety (fish, cheese advisories, and high nitrite foods) and exercise discussed. 7) Hospital and [facility] style [facility] discussed  8) Patient is asked about travel to areas at risk for the Zika virus, and counseled to avoid travel and exposure to mosquitoes or sexual partners who may have themselves been exposed to the virus. Testing is discussed, and will be ordered as appropriate.  9) [facility] advised 10) Genetic Screening, such as with 1st Trimester Screening, cell free fetal DNA, AFP testing, and Ultrasound, as well as with amniocentesis and CVS as appropriate, is discussed with patient. She plans to have genetic testing this pregnancy. 11) US soon 12) Meds for nausea; PNV  Problem list reviewed and updated.  Annamarie MajorPaul Greenlee Ancheta, MD, Merlinda FrederickFACOG [facility], [facility] 05/03/2019  10:27 AM

## 2019-05-03 NOTE — Patient Instructions (Signed)
First Trimester of Pregnancy The first trimester of pregnancy is from week 1 until the end of week 13 (months 1 through 3). A week after a sperm fertilizes an egg, the egg will implant on the wall of the uterus. This embryo will begin to develop into a baby. Genes from you and your partner will form the baby. The female genes will determine whether the baby will be a boy or a girl. At 6-8 weeks, the eyes and face will be formed, and the heartbeat can be seen on ultrasound. At the end of 12 weeks, all the baby's organs will be formed. Now that you are pregnant, you will want to do everything you can to have a healthy baby. Two of the most important things are to get good prenatal care and to follow your health care provider's instructions. Prenatal care is all the medical care you receive before the baby's birth. This care will help prevent, find, and treat any problems during the pregnancy and childbirth. Body changes during your first trimester Your body goes through many changes during pregnancy. The changes vary from woman to woman.  You may gain or lose a couple of pounds at first.  You may feel sick to your stomach (nauseous) and you may throw up (vomit). If the vomiting is uncontrollable, call your health care provider.  You may tire easily.  You may develop headaches that can be relieved by medicines. All medicines should be approved by your health care provider.  You may urinate more often. Painful urination may mean you have a bladder infection.  You may develop heartburn as a result of your pregnancy.  You may develop constipation because certain hormones are causing the muscles that push stool through your intestines to slow down.  You may develop hemorrhoids or swollen veins (varicose veins).  Your breasts may begin to grow larger and become tender. Your nipples may stick out more, and the tissue that surrounds them (areola) may become darker.  Your gums may bleed and may be  sensitive to brushing and flossing.  Dark spots or blotches (chloasma, mask of pregnancy) may develop on your face. This will likely fade after the baby is born.  Your menstrual periods will stop.  You may have a loss of appetite.  You may develop cravings for certain kinds of food.  You may have changes in your emotions from day to day, such as being excited to be pregnant or being concerned that something may go wrong with the pregnancy and baby.  You may have more vivid and strange dreams.  You may have changes in your hair. These can include thickening of your hair, rapid growth, and changes in texture. Some women also have hair loss during or after pregnancy, or hair that feels dry or thin. Your hair will most likely return to normal after your baby is born. What to expect at prenatal visits During a routine prenatal visit:  You will be weighed to make sure you and the baby are growing normally.  Your blood pressure will be taken.  Your abdomen will be measured to track your baby's growth.  The fetal heartbeat will be listened to between weeks 10 and 14 of your pregnancy.  Test results from any previous visits will be discussed. Your health care provider may ask you:  How you are feeling.  If you are feeling the baby move.  If you have had any abnormal symptoms, such as leaking fluid, bleeding, severe headaches, or abdominal   cramping.  If you are using any tobacco products, including cigarettes, chewing tobacco, and electronic cigarettes.  If you have any questions. Other tests that may be performed during your first trimester include:  Blood tests to find your blood type and to check for the presence of any previous infections. The tests will also be used to check for low iron levels (anemia) and protein on red blood cells (Rh antibodies). Depending on your risk factors, or if you previously had diabetes during pregnancy, you may have tests to check for high blood sugar  that affects pregnant women (gestational diabetes).  Urine tests to check for infections, diabetes, or protein in the urine.  An ultrasound to confirm the proper growth and development of the baby.  Fetal screens for spinal cord problems (spina bifida) and Down syndrome.  HIV (human immunodeficiency virus) testing. Routine prenatal testing includes screening for HIV, unless you choose not to have this test.  You may need other tests to make sure you and the baby are doing well. Follow these instructions at home: Medicines  Follow your health care provider's instructions regarding medicine use. Specific medicines may be either safe or unsafe to take during pregnancy.  Take a prenatal vitamin that contains at least 600 micrograms (mcg) of folic acid.  If you develop constipation, try taking a stool softener if your health care provider approves. Eating and drinking   Eat a balanced diet that includes fresh fruits and vegetables, whole grains, good sources of protein such as meat, eggs, or tofu, and low-fat dairy. Your health care provider will help you determine the amount of weight gain that is right for you.  Avoid raw meat and uncooked cheese. These carry germs that can cause birth defects in the baby.  Eating four or five small meals rather than three large meals a day may help relieve nausea and vomiting. If you start to feel nauseous, eating a few soda crackers can be helpful. Drinking liquids between meals, instead of during meals, also seems to help ease nausea and vomiting.  Limit foods that are high in fat and processed sugars, such as fried and sweet foods.  To prevent constipation: ? Eat foods that are high in fiber, such as fresh fruits and vegetables, whole grains, and beans. ? Drink enough fluid to keep your urine clear or pale yellow. Activity  Exercise only as directed by your health care provider. Most women can continue their usual exercise routine during  pregnancy. Try to exercise for 30 minutes at least 5 days a week. Exercising will help you: ? Control your weight. ? Stay in shape. ? Be prepared for labor and delivery.  Experiencing pain or cramping in the lower abdomen or lower back is a good sign that you should stop exercising. Check with your health care provider before continuing with normal exercises.  Try to avoid standing for long periods of time. Move your legs often if you must stand in one place for a long time.  Avoid heavy lifting.  Wear low-heeled shoes and practice good posture.  You may continue to have sex unless your health care provider tells you not to. Relieving pain and discomfort  Wear a good support bra to relieve breast tenderness.  Take warm sitz baths to soothe any pain or discomfort caused by hemorrhoids. Use hemorrhoid cream if your health care provider approves.  Rest with your legs elevated if you have leg cramps or low back pain.  If you develop varicose veins in   your legs, wear support hose. Elevate your feet for 15 minutes, 3-4 times a day. Limit salt in your diet. Prenatal care  Schedule your prenatal visits by the twelfth week of pregnancy. They are usually scheduled monthly at first, then more often in the last 2 months before delivery.  Write down your questions. Take them to your prenatal visits.  Keep all your prenatal visits as told by your health care provider. This is important. Safety  Wear your seat belt at all times when driving.  Make a list of emergency phone numbers, including numbers for family, friends, the hospital, and police and fire departments. General instructions  Ask your health care provider for a referral to a local prenatal education class. Begin classes no later than the beginning of month 6 of your pregnancy.  Ask for help if you have counseling or nutritional needs during pregnancy. Your health care provider can offer advice or refer you to specialists for help  with various needs.  Do not use hot tubs, steam rooms, or saunas.  Do not douche or use tampons or scented sanitary pads.  Do not cross your legs for long periods of time.  Avoid cat litter boxes and soil used by cats. These carry germs that can cause birth defects in the baby and possibly loss of the fetus by miscarriage or stillbirth.  Avoid all smoking, herbs, alcohol, and medicines not prescribed by your health care provider. Chemicals in these products affect the formation and growth of the baby.  Do not use any products that contain nicotine or tobacco, such as cigarettes and e-cigarettes. If you need help quitting, ask your health care provider. You may receive counseling support and other resources to help you quit.  Schedule a dentist appointment. At home, brush your teeth with a soft toothbrush and be gentle when you floss. Contact a health care provider if:  You have dizziness.  You have mild pelvic cramps, pelvic pressure, or nagging pain in the abdominal area.  You have persistent nausea, vomiting, or diarrhea.  You have a bad smelling vaginal discharge.  You have pain when you urinate.  You notice increased swelling in your face, hands, legs, or ankles.  You are exposed to fifth disease or chickenpox.  You are exposed to German measles (rubella) and have never had it. Get help right away if:  You have a fever.  You are leaking fluid from your vagina.  You have spotting or bleeding from your vagina.  You have severe abdominal cramping or pain.  You have rapid weight gain or loss.  You vomit blood or material that looks like coffee grounds.  You develop a severe headache.  You have shortness of breath.  You have any kind of trauma, such as from a fall or a car accident. Summary  The first trimester of pregnancy is from week 1 until the end of week 13 (months 1 through 3).  Your body goes through many changes during pregnancy. The changes vary from  woman to woman.  You will have routine prenatal visits. During those visits, your health care provider will examine you, discuss any test results you may have, and talk with you about how you are feeling. This information is not intended to replace advice given to you by your health care provider. Make sure you discuss any questions you have with your health care provider. Document Released: 07/16/2001 Document Revised: 07/04/2017 Document Reviewed: 07/03/2016 Elsevier Patient Education  2020 [employer]  

## 2019-05-04 LAB — RPR+RH+ABO+RUB AB+AB SCR+CB...
Antibody Screen: NEGATIVE
HIV Screen 4th Generation wRfx: NONREACTIVE
Hematocrit: 41 % (ref 34.0–46.6)
Hemoglobin: 13.5 g/dL (ref 11.1–15.9)
Hepatitis B Surface Ag: NEGATIVE
MCH: 29.1 pg (ref 26.6–33.0)
MCHC: 32.9 g/dL (ref 31.5–35.7)
MCV: 88 fL (ref 79–97)
Platelets: 270 10*3/uL (ref 150–450)
RBC: 4.64 x10E6/uL (ref 3.77–5.28)
RDW: 12.4 % (ref 11.7–15.4)
RPR Ser Ql: NONREACTIVE
Rh Factor: POSITIVE
Rubella Antibodies, IGG: 6.53 index (ref >0.99)
Varicella zoster IgG: 2565 index (ref >165)
WBC: 8.3 10*3/uL (ref 3.4–10.8)

## 2019-05-04 LAB — CYTOLOGY - PAP: Diagnosis: NEGATIVE

## 2019-05-05 ENCOUNTER — Ambulatory Visit: Payer: Medicaid Other | Admitting: Licensed Clinical Social Worker

## 2019-05-05 DIAGNOSIS — F411 Generalized anxiety disorder: Secondary | ICD-10-CM

## 2019-05-05 DIAGNOSIS — F331 Major depressive disorder, recurrent, moderate: Secondary | ICD-10-CM

## 2019-05-05 LAB — URINE CULTURE

## 2019-05-05 NOTE — Progress Notes (Signed)
Counselor/Therapist Progress Note  Patient ID: Anna Flores, MRN: [id number],    Date: 05/05/2019  Time Spent: 39 minutes    Treatment Type: Psychotherapy  Reported Symptoms: Obsessive thinking and anxiousness, anxious thinking, irritability  Mental Status Exam:  Appearance:   NA     Behavior:  Appropriate and Sharing  Motor:  NA  Speech/Language:   Normal Rate  Affect:  NA  Mood:  normal  Thought process:  normal  Thought content:    WNL  Sensory/Perceptual disturbances:    WNL  Orientation:  oriented to person, place and time/date  Attention:  Good  Concentration:  Good  Memory:  WNL  Fund of knowledge:   Good  Insight:    Good  Judgment:   Good  Impulse Control:  Good   Risk Assessment: Danger to Self:  No Self-injurious Behavior: No Danger to Others: No Duty to Warn:no Physical Aggression / Violence:No  Access to Firearms a concern: No  Gang Involvement:No   Subjective: Patient was engaged and cooperative throughout the session using time effectively to discuss thoughts and feelings. Patient voices continued motivation for treatment and understanding of depression and anxiety issues being exasperated by multiple stressors. Patient is likely to benefit from a combination of medication management and outpatient therapy because she remains motivated to decrease symptoms and improves functioning.    Interventions: Cognitive Behavioral Therapy  Established psychological safety. Checked in with patient. Provided supportive space encouraging emotional release and processing of current psychosocial stressors- multiple stressors. Assisted patient in weighing pros and cons for a decision that is causing significant distress. Discussed acceptance of thoughts and feelings as not good or bad and led patient in mindfulness exercise. Processed exercise. Encouraged self-care, including time for showering alone and grocery shopping alone while her husband cares for the children. Provided  support through active listening, validation of feelings, and highlighted patient's strengths.    Diagnosis:   ICD-10-CM   1. Generalized anxiety disorder  F41.1   2. Major depressive disorder, recurrent episode, moderate (HCC)  F33.1     Plan: Patient missed appointment with Dr. Jamse Arn with [facility]. She repots that she doesn't want to take Zoloft due to now being pregnant. LCSW discussed patient asking about being evaluated for Hydroxyzine.   Patient to continue CBTs over phone-based sessions every two weeks.   Interpreter used: NA   Milton Ferguson, LCSW

## 2019-05-06 ENCOUNTER — Encounter: Payer: Self-pay | Admitting: Obstetrics & Gynecology

## 2019-05-06 NOTE — Telephone Encounter (Signed)
Please advise no opening tomorrow

## 2019-05-06 NOTE — Telephone Encounter (Signed)
Per our conversation.

## 2019-05-07 ENCOUNTER — Ambulatory Visit (INDEPENDENT_AMBULATORY_CARE_PROVIDER_SITE_OTHER): Payer: Medicaid Other | Admitting: Obstetrics and Gynecology

## 2019-05-07 ENCOUNTER — Encounter: Payer: Self-pay | Admitting: Obstetrics and Gynecology

## 2019-05-07 ENCOUNTER — Other Ambulatory Visit: Payer: Self-pay | Admitting: Obstetrics and Gynecology

## 2019-05-07 ENCOUNTER — Other Ambulatory Visit: Payer: Self-pay

## 2019-05-07 ENCOUNTER — Telehealth: Payer: Self-pay | Admitting: Obstetrics and Gynecology

## 2019-05-07 VITALS — BP 110/64 | Wt 199.0 lb

## 2019-05-07 DIAGNOSIS — O0990 Supervision of high risk pregnancy, unspecified, unspecified trimester: Secondary | ICD-10-CM

## 2019-05-07 DIAGNOSIS — R7309 Other abnormal glucose: Secondary | ICD-10-CM

## 2019-05-07 DIAGNOSIS — O219 Vomiting of pregnancy, unspecified: Secondary | ICD-10-CM

## 2019-05-07 DIAGNOSIS — O210 Mild hyperemesis gravidarum: Secondary | ICD-10-CM

## 2019-05-07 DIAGNOSIS — O09299 Supervision of pregnancy with other poor reproductive or obstetric history, unspecified trimester: Secondary | ICD-10-CM

## 2019-05-07 DIAGNOSIS — Z6833 Body mass index (BMI) 33.0-33.9, adult: Secondary | ICD-10-CM

## 2019-05-07 DIAGNOSIS — O99210 Obesity complicating pregnancy, unspecified trimester: Secondary | ICD-10-CM

## 2019-05-07 DIAGNOSIS — O09291 Supervision of pregnancy with other poor reproductive or obstetric history, first trimester: Secondary | ICD-10-CM

## 2019-05-07 DIAGNOSIS — O0991 Supervision of high risk pregnancy, unspecified, first trimester: Secondary | ICD-10-CM

## 2019-05-07 DIAGNOSIS — Z8632 Personal history of gestational diabetes: Secondary | ICD-10-CM

## 2019-05-07 DIAGNOSIS — O99211 Obesity complicating pregnancy, first trimester: Secondary | ICD-10-CM

## 2019-05-07 DIAGNOSIS — Z3A01 Less than 8 weeks gestation of pregnancy: Secondary | ICD-10-CM

## 2019-05-07 LAB — POCT URINALYSIS DIPSTICK OB
Glucose, UA: NEGATIVE
POC,PROTEIN,UA: NEGATIVE

## 2019-05-07 MED ORDER — PROMETHAZINE HCL 25 MG PO TABS: 25.0000 mg | ORAL_TABLET | Freq: Three times a day (TID) | ORAL | 1 refills | Status: DC

## 2019-05-07 MED ORDER — ONDANSETRON 8 MG PO TBDP: 8.0000 mg | ORAL_TABLET | Freq: Three times a day (TID) | ORAL | 1 refills | Status: DC | PRN

## 2019-05-07 NOTE — Telephone Encounter (Signed)
Patient was seen today and is requesting mediation of phenergan. Please advise

## 2019-05-07 NOTE — Telephone Encounter (Signed)
Patient is schedule 05/07/19 with SDJ

## 2019-05-07 NOTE — Patient Instructions (Signed)
For nausea: Vitamin B6 25 mg three times a day Nexium 40 mg first thing in AM Reglan 10 mg every 8 hours  Phenergan 12.5-25 mg every 8 hours. (alternate phenergan and reglan such that you are taking something every 4 hours).

## 2019-05-07 NOTE — Telephone Encounter (Signed)
Patient aware.

## 2019-05-07 NOTE — Telephone Encounter (Signed)
Medication sent along with a refill of zofran
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
## 2019-05-08 LAB — HGB A1C W/O EAG: Hgb A1c MFr Bld: 5.2 % (ref 4.8–5.6)

## 2019-05-11 ENCOUNTER — Ambulatory Visit (INDEPENDENT_AMBULATORY_CARE_PROVIDER_SITE_OTHER): Payer: Medicaid Other | Admitting: Obstetrics and Gynecology

## 2019-05-11 ENCOUNTER — Other Ambulatory Visit: Payer: Self-pay

## 2019-05-11 ENCOUNTER — Ambulatory Visit (INDEPENDENT_AMBULATORY_CARE_PROVIDER_SITE_OTHER): Payer: Medicaid Other

## 2019-05-11 VITALS — BP 118/78 | Wt 196.0 lb

## 2019-05-11 DIAGNOSIS — Z6833 Body mass index (BMI) 33.0-33.9, adult: Secondary | ICD-10-CM

## 2019-05-11 DIAGNOSIS — O0990 Supervision of high risk pregnancy, unspecified, unspecified trimester: Secondary | ICD-10-CM

## 2019-05-11 DIAGNOSIS — Z3A01 Less than 8 weeks gestation of pregnancy: Secondary | ICD-10-CM

## 2019-05-11 DIAGNOSIS — Z8632 Personal history of gestational diabetes: Secondary | ICD-10-CM

## 2019-05-11 DIAGNOSIS — O09291 Supervision of pregnancy with other poor reproductive or obstetric history, first trimester: Secondary | ICD-10-CM

## 2019-05-11 DIAGNOSIS — O99210 Obesity complicating pregnancy, unspecified trimester: Secondary | ICD-10-CM

## 2019-05-11 DIAGNOSIS — O0991 Supervision of high risk pregnancy, unspecified, first trimester: Secondary | ICD-10-CM

## 2019-05-11 DIAGNOSIS — N912 Amenorrhea, unspecified: Secondary | ICD-10-CM | POA: Diagnosis not present

## 2019-05-11 DIAGNOSIS — O210 Mild hyperemesis gravidarum: Secondary | ICD-10-CM

## 2019-05-11 DIAGNOSIS — O09299 Supervision of pregnancy with other poor reproductive or obstetric history, unspecified trimester: Secondary | ICD-10-CM

## 2019-05-11 DIAGNOSIS — O99211 Obesity complicating pregnancy, first trimester: Secondary | ICD-10-CM

## 2019-05-11 MED ORDER — DIMENHYDRINATE 50 MG PO CHEW: 50.0000 mg | CHEWABLE_TABLET | Freq: Four times a day (QID) | ORAL | 1 refills | Status: DC
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
## 2019-05-19 ENCOUNTER — Ambulatory Visit: Payer: Medicaid Other | Admitting: Licensed Clinical Social Worker

## 2019-05-21 ENCOUNTER — Encounter: Payer: Medicaid Other | Admitting: Obstetrics and Gynecology

## 2019-06-13 ENCOUNTER — Emergency Department: Payer: Medicaid Other

## 2019-06-13 ENCOUNTER — Other Ambulatory Visit: Payer: Self-pay

## 2019-06-13 ENCOUNTER — Emergency Department
Admission: EM | Admit: 2019-06-13 | Discharge: 2019-06-13 | Disposition: A | Discharge status: Home / Self Care | Payer: Medicaid Other | Attending: Emergency Medicine | Admitting: Emergency Medicine

## 2019-06-13 DIAGNOSIS — Z79899 Other long term (current) drug therapy: Secondary | ICD-10-CM | POA: Insufficient documentation

## 2019-06-13 DIAGNOSIS — R519 Headache, unspecified: Secondary | ICD-10-CM | POA: Diagnosis present

## 2019-06-13 DIAGNOSIS — R1031 Right lower quadrant pain: Secondary | ICD-10-CM | POA: Diagnosis not present

## 2019-06-13 LAB — COMPREHENSIVE METABOLIC PANEL
ALT: 12 U/L (ref 0–44)
AST: 14 U/L — ABNORMAL LOW (ref 15–41)
Albumin: 4.4 g/dL (ref 3.5–5.0)
Alkaline Phosphatase: 64 U/L (ref 38–126)
Anion gap: 9 (ref 5–15)
BUN: 10 mg/dL (ref 6–20)
CO2: 26 mmol/L (ref 22–32)
Calcium: 9.3 mg/dL (ref 8.9–10.3)
Chloride: 102 mmol/L (ref 98–111)
Creatinine, Ser: 0.68 mg/dL (ref 0.44–1.00)
GFR calc Af Amer: >60 mL/min (ref >60)
GFR calc non Af Amer: >60 mL/min (ref >60)
Glucose, Bld: 121 mg/dL — ABNORMAL HIGH (ref 70–99)
Potassium: 3.7 mmol/L (ref 3.5–5.1)
Sodium: 137 mmol/L (ref 135–145)
Total Bilirubin: 1.4 mg/dL — ABNORMAL HIGH (ref 0.3–1.2)
Total Protein: 7.5 g/dL (ref 6.5–8.1)

## 2019-06-13 LAB — URINALYSIS, COMPLETE (UACMP) WITH MICROSCOPIC
Bacteria, UA: NONE SEEN
Bilirubin Urine: NEGATIVE
Glucose, UA: NEGATIVE mg/dL
Ketones, ur: NEGATIVE mg/dL
Leukocytes,Ua: NEGATIVE
Nitrite: NEGATIVE
Protein, ur: NEGATIVE mg/dL
Specific Gravity, Urine: 1.021 (ref 1.005–1.030)
pH: 5 (ref 5.0–8.0)

## 2019-06-13 LAB — CBC
HCT: 41 % (ref 36.0–46.0)
Hemoglobin: 13.6 g/dL (ref 12.0–15.0)
MCH: 28.8 pg (ref 26.0–34.0)
MCHC: 33.2 g/dL (ref 30.0–36.0)
MCV: 86.9 fL (ref 80.0–100.0)
Platelets: 300 10*3/uL (ref 150–400)
RBC: 4.72 MIL/uL (ref 3.87–5.11)
RDW: 12.3 % (ref 11.5–15.5)
WBC: 7.2 10*3/uL (ref 4.0–10.5)
nRBC: 0 % (ref 0.0–0.2)

## 2019-06-13 LAB — LIPASE, BLOOD: Lipase: 28 U/L (ref 11–51)

## 2019-06-13 LAB — HCG, QUANTITATIVE, PREGNANCY: hCG, Beta Chain, Quant, S: 48 m[IU]/mL — ABNORMAL HIGH (ref <5)

## 2019-06-13 MED ORDER — ACETAMINOPHEN 500 MG PO TABS
1000.0000 mg | ORAL_TABLET | Freq: Once | ORAL | Status: AC
  Administered 2019-06-13: 1000 mg via ORAL
  Filled 2019-06-13: qty 2

## 2019-06-13 MED ORDER — OXYCODONE-ACETAMINOPHEN 5-325 MG PO TABS: 1.0000 | ORAL_TABLET | ORAL | 0 refills | Status: DC | PRN

## 2019-06-13 MED ORDER — IOHEXOL 300 MG/ML  SOLN
100.0000 mL | Freq: Once | INTRAMUSCULAR | Status: AC | PRN
  Administered 2019-06-13: 100 mL via INTRAVENOUS

## 2019-06-13 MED ORDER — SODIUM CHLORIDE 0.9% FLUSH: 3.0000 mL | Freq: Once | INTRAVENOUS | Status: DC

## 2019-06-13 MED ORDER — IOHEXOL 9 MG/ML PO SOLN
500.0000 mL | ORAL | Status: AC
  Administered 2019-06-13 (×2): 500 mL via ORAL
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
## 2019-06-13 NOTE — ED Notes (Signed)
CT notified patient finished with oral contrast 

## 2019-06-13 NOTE — ED Notes (Signed)
Pt states she feels well enough to walk to lobby. Pt discharged.

## 2019-06-13 NOTE — ED Notes (Signed)
Dr Cinda Quest at bedside with results.

## 2019-06-13 NOTE — Discharge Instructions (Addendum)
I will give you a little bit of Percocet if needed for the bellyache.  Use 1 4 times a day.  Be careful can make you woozy and constipated.  Do not drive on it.  Please return for worse pain, fever or vomiting.  Also return if the headache gets worse.

## 2019-06-13 NOTE — ED Notes (Addendum)
Pt states headache has improved with Tylenol, states she is ready to go home to eat since blood sugar drops when she doesn't eat.EDP states pt  Can eat and drink. Pt brought juice and crackers to hold her over until discharge.

## 2019-06-13 NOTE — ED Triage Notes (Signed)
Pt c/o RUQ to umbilical pain for the past couple of days with nausea, diarrhea and HA with recent surgical abortion on 05/22/2019.Marland Kitchen

## 2019-08-03 ENCOUNTER — Telehealth: Payer: Self-pay

## 2019-08-03 NOTE — Telephone Encounter (Signed)
We are schedule 3 weeks for NOB appointment's. Patient states she has a HX of a miscarriage and wants to know if she needs appointment for labs to follow her levels prior to office NOB appointment

## 2019-08-03 NOTE — Telephone Encounter (Signed)
Pt calling triage stating she had a positive UPT, can you please schedule pt for NOB and we will do labs at that time? Thank you.

## 2019-08-04 NOTE — Telephone Encounter (Signed)
We could trend beta hCG levels, if she likes.  Also, how far along is she supposed to be?

## 2019-08-04 NOTE — Telephone Encounter (Signed)
Last menstrual period spotting was 06/27/19

## 2019-08-05 ENCOUNTER — Other Ambulatory Visit: Payer: Medicaid Other

## 2019-08-05 ENCOUNTER — Other Ambulatory Visit: Payer: Self-pay | Admitting: Obstetrics and Gynecology

## 2019-08-05 DIAGNOSIS — Z8759 Personal history of other complications of pregnancy, childbirth and the puerperium: Secondary | ICD-10-CM

## 2019-08-05 NOTE — Telephone Encounter (Signed)
We could have lab appointments to trend her hCG levels while she's awaiting her NOB appointment.

## 2019-08-05 NOTE — Telephone Encounter (Signed)
Patient would like her levels check. Please place order and advise scheduling.

## 2019-08-05 NOTE — Telephone Encounter (Signed)
Can have a draw every 48 hours starting whatever day. Orders placed.

## 2019-08-05 NOTE — Telephone Encounter (Signed)
Patient is schedule 08/09/19 and 08/11/19

## 2019-08-06 NOTE — L&D Delivery Note (Signed)
Vaginal Delivery Note  Spontaneous delivery of live viable female infant from the ROA position through an intact perineum. Delivery of anterior left shoulder with gentle downward guidance followed by delivery of the right posterior shoulder with gentle upward guidance. Body followed spontaneously. Infant placed on maternal chest. Nursery present and helped with neonatal resuscitation and evaluation. Cord clamped and cut after one minute. Cord blood collected. Placenta delivered spontaneously and intact with a 3 vessel cord.  No lacerations. Uterus firm and below umbilicus at the end of the delivery.  Mom and baby recovering in stable condition. Sponge and needle counts were correct at the end of the delivery.  APGARS: 1 minute:9 5 minutes: 9 Weight: pending  Adelene Idler MD [facility], [facility] 03/26/20 8:26 PM

## 2019-08-09 ENCOUNTER — Other Ambulatory Visit: Payer: Self-pay

## 2019-08-09 ENCOUNTER — Other Ambulatory Visit: Payer: Medicaid Other

## 2019-08-09 DIAGNOSIS — Z8759 Personal history of other complications of pregnancy, childbirth and the puerperium: Secondary | ICD-10-CM

## 2019-08-10 LAB — BETA HCG QUANT (REF LAB): hCG Quant: 7037 m[IU]/mL

## 2019-08-10 NOTE — Telephone Encounter (Signed)
Patient was seen for Beta labs today. Please advise course for treatment for patient

## 2019-08-11 ENCOUNTER — Other Ambulatory Visit: Payer: Self-pay

## 2019-08-11 ENCOUNTER — Emergency Department: Payer: Medicaid Other

## 2019-08-11 ENCOUNTER — Other Ambulatory Visit: Payer: Medicaid Other

## 2019-08-11 ENCOUNTER — Encounter: Payer: Self-pay | Admitting: Emergency Medicine

## 2019-08-11 ENCOUNTER — Emergency Department
Admission: EM | Admit: 2019-08-11 | Discharge: 2019-08-11 | Disposition: A | Discharge status: Home / Self Care | Payer: Medicaid Other | Attending: Emergency Medicine | Admitting: Emergency Medicine

## 2019-08-11 DIAGNOSIS — R1013 Epigastric pain: Secondary | ICD-10-CM | POA: Insufficient documentation

## 2019-08-11 DIAGNOSIS — Z20822 Contact with and (suspected) exposure to covid-19: Secondary | ICD-10-CM | POA: Diagnosis not present

## 2019-08-11 DIAGNOSIS — R0789 Other chest pain: Secondary | ICD-10-CM | POA: Diagnosis not present

## 2019-08-11 DIAGNOSIS — O26899 Other specified pregnancy related conditions, unspecified trimester: Secondary | ICD-10-CM | POA: Diagnosis not present

## 2019-08-11 DIAGNOSIS — R509 Fever, unspecified: Secondary | ICD-10-CM | POA: Diagnosis present

## 2019-08-11 DIAGNOSIS — Z79899 Other long term (current) drug therapy: Secondary | ICD-10-CM | POA: Insufficient documentation

## 2019-08-11 LAB — COMPREHENSIVE METABOLIC PANEL
ALT: 15 U/L (ref 0–44)
AST: 17 U/L (ref 15–41)
Albumin: 4.4 g/dL (ref 3.5–5.0)
Alkaline Phosphatase: 58 U/L (ref 38–126)
Anion gap: 6 (ref 5–15)
BUN: 6 mg/dL (ref 6–20)
CO2: 27 mmol/L (ref 22–32)
Calcium: 9.4 mg/dL (ref 8.9–10.3)
Chloride: 103 mmol/L (ref 98–111)
Creatinine, Ser: 0.65 mg/dL (ref 0.44–1.00)
GFR calc Af Amer: >60 mL/min (ref >60)
GFR calc non Af Amer: >60 mL/min (ref >60)
Glucose, Bld: 110 mg/dL — ABNORMAL HIGH (ref 70–99)
Potassium: 3.4 mmol/L — ABNORMAL LOW (ref 3.5–5.1)
Sodium: 136 mmol/L (ref 135–145)
Total Bilirubin: 1.2 mg/dL (ref 0.3–1.2)
Total Protein: 7.5 g/dL (ref 6.5–8.1)

## 2019-08-11 LAB — URINALYSIS, COMPLETE (UACMP) WITH MICROSCOPIC
Bilirubin Urine: NEGATIVE
Glucose, UA: NEGATIVE mg/dL
Hgb urine dipstick: NEGATIVE
Ketones, ur: NEGATIVE mg/dL
Nitrite: NEGATIVE
Protein, ur: NEGATIVE mg/dL
Specific Gravity, Urine: 1.019 (ref 1.005–1.030)
pH: 7 (ref 5.0–8.0)

## 2019-08-11 LAB — CBC
HCT: 38.4 % (ref 36.0–46.0)
Hemoglobin: 13 g/dL (ref 12.0–15.0)
MCH: 29.1 pg (ref 26.0–34.0)
MCHC: 33.9 g/dL (ref 30.0–36.0)
MCV: 86.1 fL (ref 80.0–100.0)
Platelets: 282 10*3/uL (ref 150–400)
RBC: 4.46 MIL/uL (ref 3.87–5.11)
RDW: 12.7 % (ref 11.5–15.5)
WBC: 9.4 10*3/uL (ref 4.0–10.5)
nRBC: 0 % (ref 0.0–0.2)

## 2019-08-11 LAB — TROPONIN I (HIGH SENSITIVITY): Troponin I (High Sensitivity): <2 ng/L (ref <18)

## 2019-08-11 LAB — LIPASE, BLOOD: Lipase: 30 U/L (ref 11–51)

## 2019-08-11 LAB — POCT PREGNANCY, URINE: Preg Test, Ur: POSITIVE — AB

## 2019-08-11 NOTE — ED Notes (Signed)
Pt in US

## 2019-08-11 NOTE — ED Triage Notes (Signed)
Patient reports central chest pain and RUQ abdominal pain starting yesterday. Patient reports some nausea as well. States she is [redacted] weeks pregnant. Patient also reports her husband works in L[employer] and has had contact with COVID positive patients and coworkers.

## 2019-08-11 NOTE — ED Triage Notes (Signed)
Pt called from [facility] to treatment room, no response 
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
## 2019-08-12 LAB — SARS CORONAVIRUS 2 (TAT 6-24 HRS): SARS Coronavirus 2: NEGATIVE

## 2019-08-16 ENCOUNTER — Other Ambulatory Visit: Payer: Self-pay

## 2019-08-16 ENCOUNTER — Other Ambulatory Visit: Payer: Medicaid Other

## 2019-08-16 DIAGNOSIS — Z8759 Personal history of other complications of pregnancy, childbirth and the puerperium: Secondary | ICD-10-CM

## 2019-08-17 ENCOUNTER — Telehealth: Payer: Self-pay | Admitting: Obstetrics and Gynecology

## 2019-08-17 ENCOUNTER — Other Ambulatory Visit: Payer: Self-pay | Admitting: Obstetrics and Gynecology

## 2019-08-17 DIAGNOSIS — O09299 Supervision of pregnancy with other poor reproductive or obstetric history, unspecified trimester: Secondary | ICD-10-CM

## 2019-08-17 LAB — BETA HCG QUANT (REF LAB): hCG Quant: 36847 m[IU]/mL

## 2019-08-17 NOTE — Telephone Encounter (Signed)
Patient is schedule for Wednesday, 08/18/19 at 2:30 for Ultrasound and NOB at 3:10 with SDJ. Please place ultrasound order. Thank you!

## 2019-08-17 NOTE — Telephone Encounter (Signed)
Order placed. Thank you for scheduling her.

## 2019-08-18 ENCOUNTER — Ambulatory Visit (INDEPENDENT_AMBULATORY_CARE_PROVIDER_SITE_OTHER): Payer: Medicaid Other

## 2019-08-18 ENCOUNTER — Other Ambulatory Visit (HOSPITAL_COMMUNITY)
Admission: RE | Admit: 2019-08-18 | Discharge: 2019-08-18 | Disposition: A | Discharge status: Home / Self Care | Payer: Medicaid Other | Source: Ambulatory Visit | Attending: Obstetrics and Gynecology | Admitting: Obstetrics and Gynecology

## 2019-08-18 ENCOUNTER — Ambulatory Visit (INDEPENDENT_AMBULATORY_CARE_PROVIDER_SITE_OTHER): Payer: Medicaid Other | Admitting: Obstetrics and Gynecology

## 2019-08-18 ENCOUNTER — Encounter: Payer: Self-pay | Admitting: Obstetrics and Gynecology

## 2019-08-18 ENCOUNTER — Other Ambulatory Visit: Payer: Self-pay

## 2019-08-18 VITALS — BP 112/70 | Wt 203.0 lb

## 2019-08-18 DIAGNOSIS — O09299 Supervision of pregnancy with other poor reproductive or obstetric history, unspecified trimester: Secondary | ICD-10-CM

## 2019-08-18 DIAGNOSIS — O0990 Supervision of high risk pregnancy, unspecified, unspecified trimester: Secondary | ICD-10-CM

## 2019-08-18 DIAGNOSIS — Z8632 Personal history of gestational diabetes: Secondary | ICD-10-CM

## 2019-08-18 DIAGNOSIS — Z6834 Body mass index (BMI) 34.0-34.9, adult: Secondary | ICD-10-CM | POA: Insufficient documentation

## 2019-08-18 DIAGNOSIS — O99210 Obesity complicating pregnancy, unspecified trimester: Secondary | ICD-10-CM

## 2019-08-18 DIAGNOSIS — O99211 Obesity complicating pregnancy, first trimester: Secondary | ICD-10-CM | POA: Diagnosis not present

## 2019-08-18 DIAGNOSIS — O0991 Supervision of high risk pregnancy, unspecified, first trimester: Secondary | ICD-10-CM

## 2019-08-18 DIAGNOSIS — O09291 Supervision of pregnancy with other poor reproductive or obstetric history, first trimester: Secondary | ICD-10-CM

## 2019-08-18 DIAGNOSIS — Z3687 Encounter for antenatal screening for uncertain dates: Secondary | ICD-10-CM | POA: Diagnosis not present

## 2019-08-18 DIAGNOSIS — Z3A01 Less than 8 weeks gestation of pregnancy: Secondary | ICD-10-CM | POA: Diagnosis not present

## 2019-08-18 NOTE — Progress Notes (Signed)
New Obstetric Patient H&P   Chief Complaint: "Desires prenatal care"   History of Present Illness: Patient is a 29 y.o. [id number] Not Hispanic or Latino female,sure  LMP 06/27/2019 presents with amenorrhea and positive home pregnancy test. Based on her  LMP, her EDD is Estimated Date of Delivery: 04/02/20 and her EGA is [redacted]w[redacted]d. Her last pap smear was 4 months ago and was no abnormalities.    Since her LMP she claims she has experienced headaches that started about 2-3 weeks ago. . She denies vaginal bleeding. Her past medical history is noncontributory. Her prior pregnancies are notable for diet controlled gestational diabetes.  She also has had hyperemesis gravidarum.   Since her LMP, she admits to the use of tobacco products  no She claims she has gained zero pounds since the start of her pregnancy.  There are cats in the home in the home  no  She admits close contact with children on a regular basis  yes  She has had chicken pox in the past yes She has had Tuberculosis exposures, symptoms, or previously tested positive for TB   no Current or past history of domestic violence. no  Genetic Screening/Teratology Counseling: (Includes patient, baby's father, or anyone in either family with:)   1. Patient's age >/= 31 at [facility]  no 2. Thalassemia ([place], [place], Mediterranean, or Asian background): MCV<80  no 3. Neural tube defect (meningomyelocele, spina bifida, anencephaly)  no 4. Congenital heart defect  no  5. Down syndrome  no 6. Tay-Sachs (Jewish, [place]  no 7. Canavan's Disease  no 8. Sickle cell disease or trait (African)  no  9. Hemophilia or other blood disorders  no  10. Muscular dystrophy  no  11. Cystic fibrosis  no  12. Huntington's Chorea  no  13. Mental retardation/autism  no 14. Other inherited genetic or chromosomal disorder  no 15. Maternal metabolic disorder (DM, PKU, etc)  no 16. Patient or FOB with a child with a birth defect not listed above no  16a.  Patient or FOB with a birth defect themselves no 17. Recurrent pregnancy loss, or stillbirth  no  18. Any medications since LMP other than prenatal vitamins (include vitamins, supplements, OTC meds, drugs, alcohol)  no 19. Any other genetic/environmental exposure to discuss  no PATIENT ADOPTED   Infection History:   1. Lives with someone with TB or TB exposed  no  2. Patient or partner has history of genital herpes  no 3. Rash or viral illness since LMP  no 4. History of STI (GC, CT, HPV, syphilis, HIV)  no 5. History of recent travel :  no  Other pertinent information:  no     Review of Systems:10 point review of systems negative unless otherwise noted in HPI  Past Medical History:  Diagnosis Date  . Abdominal pain 10/26/2013  . Anemia 2013  . Anxiety   . Depression   . History of Papanicolaou smear of cervix 10/02/2016   NEG  . Menometrorrhagia 10/26/2013  . Pregnancy    DELIVERED 08/12/16 - JEG    Past Surgical History:  Procedure Laterality Date  . DIAGNOSTIC LAPAROSCOPY  2018   suspected ectopic pregnancy-[facility]  . TONSILLECTOMY      Gynecologic History: Patient's last menstrual period was 06/27/2019 (exact date).  Obstetric History: [id number], s/p SVD x 4.  Largest baby 8lb 6 oz (first baby) - shoulder dystocia.  No issue with this in any of the other pregnancies.   Family History  Adopted: Yes  Problem Relation Age of Onset  . Nevi Sister        DISPLASTIC NEVUS    Social History   Socioeconomic History  . Marital status: Married    Spouse name: [name]   . Number of children: 3  . Years of education: 3  . Highest education level: Not on file  Occupational History  . Occupation: HOMEMAKER  Tobacco Use  . Smoking status: Never Smoker  . Smokeless tobacco: Never Used  Substance and Sexual Activity  . Alcohol use: No  . Drug use: No  . Sexual activity: Yes    Birth control/protection: None  Other Topics Concern  . Not on file  Social History  Narrative  . Not on file   Social Determinants of Health   Financial Resource Strain:   . Difficulty of Paying Living Expenses: Not on file  Food Insecurity:   . Worried About Programme researcher, broadcasting/film/video in the Last Year: Not on file  . Ran Out of Food in the Last Year: Not on file  Transportation Needs:   . Lack of Transportation (Medical): Not on file  . Lack of Transportation (Non-Medical): Not on file  Physical Activity:   . Days of Exercise per Week: Not on file  . Minutes of Exercise per Session: Not on file  Stress:   . Feeling of Stress : Not on file  Social Connections:   . Frequency of Communication with Friends and Family: Not on file  . Frequency of Social Gatherings with Friends and Family: Not on file  . Attends Religious Services: Not on file  . Active Member of Clubs or Organizations: Not on file  . Attends Banker Meetings: Not on file  . Marital Status: Not on file  Intimate Partner Violence:   . Fear of Current or Ex-Partner: Not on file  . Emotionally Abused: Not on file  . Physically Abused: Not on file  . Sexually Abused: Not on file    Allergies  Allergen Reactions  . Amoxicillin Hives  . Hydrocodone Shortness Of Breath and Other (See Comments)    "loss of consciousness"; "felt really weird" when she was given 10 mgm hydrocodone. Did well with 5 mgm tablet  . Cherry Hives    Prior to Admission medications: denies    Physical Exam BP 112/70   Wt 203 lb (92.1 kg)   LMP 06/27/2019 (Exact Date)   BMI 34.84 kg/m   Physical Exam Vitals reviewed. Exam conducted with a chaperone present.  Constitutional:      General: She is not in acute distress.    Appearance: Normal appearance.  HENT:     Head: Normocephalic and atraumatic.  Eyes:     General: No scleral icterus.    Conjunctiva/sclera: Conjunctivae normal.  Cardiovascular:     Rate and Rhythm: Normal rate and regular rhythm.     Heart sounds: No murmur. No friction rub. No gallop.    Pulmonary:     Effort: Pulmonary effort is normal.     Breath sounds: Normal breath sounds. No wheezing, rhonchi or rales.  Abdominal:     General: There is no distension.     Palpations: Abdomen is soft.     Tenderness: There is no abdominal tenderness. There is no guarding or rebound.  Genitourinary:    General: Normal vulva.     Exam position: Lithotomy position.     Tanner stage (genital): 5.     Labia:  Right: No rash, tenderness, lesion or injury.        Left: No rash, tenderness, lesion or injury.   Musculoskeletal:        General: No swelling or tenderness. Normal range of motion.     Cervical back: Normal range of motion. No rigidity.  Skin:    General: Skin is warm and dry.     Coloration: Skin is not jaundiced.  Neurological:     General: No focal deficit present.     Mental Status: She is alert and oriented to person, place, and time.     Cranial Nerves: No cranial nerve deficit.  Psychiatric:        Mood and Affect: Mood normal.        Behavior: Behavior normal.        Judgment: Judgment normal.      Female Chaperone present during breast and/or pelvic exam.   Assessment: 29 y.o. [id number] at [redacted]w[redacted]d presenting to initiate prenatal care  Plan: 1) Avoid alcoholic beverages. 2) Patient encouraged not to smoke.  3) Discontinue the use of all non-medicinal drugs and chemicals.  4) Take prenatal vitamins daily.  5) Nutrition, food safety (fish, cheese advisories, and high nitrite foods) and exercise discussed. 6) Hospital and practice style discussed with cross coverage system.  7) Genetic Screening, such as with 1st Trimester Screening, cell free fetal DNA, AFP testing, and Ultrasound, as well as with amniocentesis and CVS as appropriate, is discussed with patient. At the conclusion of today's visit patient requested genetic testing 8) Patient is asked about travel to areas at risk for the Zika virus, and counseled to avoid travel and exposure to mosquitoes or  sexual partners who may have themselves been exposed to the virus. Testing is discussed, and will be ordered as appropriate.   Prentice Docker, MD 08/18/2019 3:44 PM

## 2019-08-18 NOTE — Addendum Note (Signed)
Addended by: Thomasene Mohair D on: 08/18/2019 04:59 PM   Modules accepted: Orders

## 2019-08-20 ENCOUNTER — Other Ambulatory Visit: Payer: Medicaid Other

## 2019-08-20 ENCOUNTER — Other Ambulatory Visit: Payer: Self-pay

## 2019-08-20 DIAGNOSIS — O99210 Obesity complicating pregnancy, unspecified trimester: Secondary | ICD-10-CM

## 2019-08-20 DIAGNOSIS — O09299 Supervision of pregnancy with other poor reproductive or obstetric history, unspecified trimester: Secondary | ICD-10-CM

## 2019-08-20 DIAGNOSIS — Z8632 Personal history of gestational diabetes: Secondary | ICD-10-CM

## 2019-08-20 DIAGNOSIS — O0990 Supervision of high risk pregnancy, unspecified, unspecified trimester: Secondary | ICD-10-CM

## 2019-08-20 LAB — CERVICOVAGINAL ANCILLARY ONLY
Chlamydia: NEGATIVE
Comment: NEGATIVE
Comment: NORMAL
Neisseria Gonorrhea: NEGATIVE

## 2019-08-21 LAB — RPR+RH+ABO+RUB AB+AB SCR+CB...
Antibody Screen: NEGATIVE
HIV Screen 4th Generation wRfx: NONREACTIVE
Hematocrit: 40.8 % (ref 34.0–46.6)
Hemoglobin: 13.3 g/dL (ref 11.1–15.9)
Hepatitis B Surface Ag: NEGATIVE
MCH: 28.7 pg (ref 26.6–33.0)
MCHC: 32.6 g/dL (ref 31.5–35.7)
MCV: 88 fL (ref 79–97)
Platelets: 291 10*3/uL (ref 150–450)
RBC: 4.64 x10E6/uL (ref 3.77–5.28)
RDW: 12.8 % (ref 11.7–15.4)
RPR Ser Ql: NONREACTIVE
Rh Factor: POSITIVE
Rubella Antibodies, IGG: 6.27 index (ref >0.99)
Varicella zoster IgG: 2517 index (ref >165)
WBC: 7.3 10*3/uL (ref 3.4–10.8)

## 2019-08-21 LAB — GLUCOSE, 1 HOUR GESTATIONAL: Gestational Diabetes Screen: 124 mg/dL (ref 65–139)

## 2019-09-15 ENCOUNTER — Other Ambulatory Visit: Payer: Self-pay

## 2019-09-15 ENCOUNTER — Encounter: Payer: Self-pay | Admitting: Obstetrics and Gynecology

## 2019-09-15 ENCOUNTER — Ambulatory Visit (INDEPENDENT_AMBULATORY_CARE_PROVIDER_SITE_OTHER): Payer: Medicaid Other | Admitting: Obstetrics and Gynecology

## 2019-09-15 VITALS — BP 118/78 | Wt 200.0 lb

## 2019-09-15 DIAGNOSIS — O99211 Obesity complicating pregnancy, first trimester: Secondary | ICD-10-CM

## 2019-09-15 DIAGNOSIS — Z8632 Personal history of gestational diabetes: Secondary | ICD-10-CM

## 2019-09-15 DIAGNOSIS — O0991 Supervision of high risk pregnancy, unspecified, first trimester: Secondary | ICD-10-CM

## 2019-09-15 DIAGNOSIS — O09299 Supervision of pregnancy with other poor reproductive or obstetric history, unspecified trimester: Secondary | ICD-10-CM

## 2019-09-15 DIAGNOSIS — Z1379 Encounter for other screening for genetic and chromosomal anomalies: Secondary | ICD-10-CM

## 2019-09-15 DIAGNOSIS — O99210 Obesity complicating pregnancy, unspecified trimester: Secondary | ICD-10-CM

## 2019-09-15 DIAGNOSIS — Z3A11 11 weeks gestation of pregnancy: Secondary | ICD-10-CM

## 2019-09-15 DIAGNOSIS — Z6834 Body mass index (BMI) 34.0-34.9, adult: Secondary | ICD-10-CM

## 2019-09-15 DIAGNOSIS — O09291 Supervision of pregnancy with other poor reproductive or obstetric history, first trimester: Secondary | ICD-10-CM

## 2019-09-15 DIAGNOSIS — O0990 Supervision of high risk pregnancy, unspecified, unspecified trimester: Secondary | ICD-10-CM
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
## 2019-09-24 ENCOUNTER — Telehealth: Payer: Self-pay

## 2019-09-24 NOTE — Telephone Encounter (Signed)
Pt called for MAT 21 results, shes aware they are not back yet.

## 2019-09-28 ENCOUNTER — Other Ambulatory Visit: Payer: Self-pay | Admitting: Obstetrics and Gynecology

## 2019-09-28 ENCOUNTER — Encounter: Payer: Self-pay | Admitting: Emergency Medicine

## 2019-09-28 ENCOUNTER — Other Ambulatory Visit: Payer: Self-pay

## 2019-09-28 ENCOUNTER — Emergency Department
Admission: EM | Admit: 2019-09-28 | Discharge: 2019-09-28 | Discharge status: Against Medical Advice | Payer: Medicaid Other | Attending: Emergency Medicine | Admitting: Emergency Medicine

## 2019-09-28 DIAGNOSIS — O210 Mild hyperemesis gravidarum: Secondary | ICD-10-CM | POA: Insufficient documentation

## 2019-09-28 DIAGNOSIS — Z3A13 13 weeks gestation of pregnancy: Secondary | ICD-10-CM | POA: Insufficient documentation

## 2019-09-28 DIAGNOSIS — O0990 Supervision of high risk pregnancy, unspecified, unspecified trimester: Secondary | ICD-10-CM

## 2019-09-28 DIAGNOSIS — Z5320 Procedure and treatment not carried out because of patient's decision for unspecified reasons: Secondary | ICD-10-CM | POA: Insufficient documentation

## 2019-09-28 DIAGNOSIS — O26891 Other specified pregnancy related conditions, first trimester: Secondary | ICD-10-CM

## 2019-09-28 LAB — COMPREHENSIVE METABOLIC PANEL
ALT: 14 U/L (ref 0–44)
AST: 14 U/L — ABNORMAL LOW (ref 15–41)
Albumin: 3.6 g/dL (ref 3.5–5.0)
Alkaline Phosphatase: 46 U/L (ref 38–126)
Anion gap: 8 (ref 5–15)
BUN: <5 mg/dL — ABNORMAL LOW (ref 6–20)
CO2: 23 mmol/L (ref 22–32)
Calcium: 9.2 mg/dL (ref 8.9–10.3)
Chloride: 105 mmol/L (ref 98–111)
Creatinine, Ser: 0.44 mg/dL (ref 0.44–1.00)
GFR calc Af Amer: >60 mL/min (ref >60)
GFR calc non Af Amer: >60 mL/min (ref >60)
Glucose, Bld: 99 mg/dL (ref 70–99)
Potassium: 3.4 mmol/L — ABNORMAL LOW (ref 3.5–5.1)
Sodium: 136 mmol/L (ref 135–145)
Total Bilirubin: 0.9 mg/dL (ref 0.3–1.2)
Total Protein: 6.9 g/dL (ref 6.5–8.1)

## 2019-09-28 LAB — URINALYSIS, COMPLETE (UACMP) WITH MICROSCOPIC
Bilirubin Urine: NEGATIVE
Glucose, UA: NEGATIVE mg/dL
Hgb urine dipstick: NEGATIVE
Ketones, ur: 20 mg/dL — AB
Nitrite: NEGATIVE
Protein, ur: NEGATIVE mg/dL
Specific Gravity, Urine: 1.02 (ref 1.005–1.030)
pH: 7 (ref 5.0–8.0)

## 2019-09-28 LAB — CBC
HCT: 36.7 % (ref 36.0–46.0)
Hemoglobin: 12.6 g/dL (ref 12.0–15.0)
MCH: 29.1 pg (ref 26.0–34.0)
MCHC: 34.3 g/dL (ref 30.0–36.0)
MCV: 84.8 fL (ref 80.0–100.0)
Platelets: 240 10*3/uL (ref 150–400)
RBC: 4.33 MIL/uL (ref 3.87–5.11)
RDW: 12.5 % (ref 11.5–15.5)
WBC: 6 10*3/uL (ref 4.0–10.5)
nRBC: 0 % (ref 0.0–0.2)

## 2019-09-28 LAB — LIPASE, BLOOD: Lipase: 24 U/L (ref 11–51)

## 2019-09-28 MED ORDER — METOCLOPRAMIDE HCL 5 MG/ML IJ SOLN
10.0000 mg | Freq: Once | INTRAMUSCULAR | Status: AC
  Administered 2019-09-28: 10 mg via INTRAVENOUS
  Filled 2019-09-28: qty 2

## 2019-09-28 MED ORDER — PROCHLORPERAZINE MALEATE 10 MG PO TABS: 10.0000 mg | ORAL_TABLET | Freq: Four times a day (QID) | ORAL | 0 refills | Status: DC | PRN

## 2019-09-28 MED ORDER — SODIUM CHLORIDE 0.9% FLUSH: 3.0000 mL | Freq: Once | INTRAVENOUS | Status: DC

## 2019-09-28 MED ORDER — SODIUM CHLORIDE 0.9 % IV SOLN: Freq: Once | INTRAVENOUS | Status: AC

## 2019-09-28 NOTE — ED Triage Notes (Signed)
Presents with some n/v    States she is [redacted] weeks pregnant

## 2019-09-28 NOTE — ED Notes (Signed)
Pt presents to ED via POV, states is [redacted] weeks pregnant, states is a [id number]. Pt states began having vomiting yesterday and has thrown up numerous times since yesterday. States was referred by [facility] for possible dehydration.
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
## 2019-09-28 NOTE — ED Notes (Signed)
Pt pulled IV out and left while RN was with another pt

## 2019-10-01 ENCOUNTER — Other Ambulatory Visit: Payer: Self-pay | Admitting: Obstetrics and Gynecology

## 2019-10-01 DIAGNOSIS — O0990 Supervision of high risk pregnancy, unspecified, unspecified trimester: Secondary | ICD-10-CM

## 2019-10-01 DIAGNOSIS — Z1379 Encounter for other screening for genetic and chromosomal anomalies: Secondary | ICD-10-CM
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
## 2019-10-01 NOTE — Addendum Note (Signed)
Addended by: Thomasene Mohair D on: 10/01/2019 02:11 PM   Modules accepted: Orders

## 2019-10-01 NOTE — Telephone Encounter (Signed)
OK. Order has been placed.

## 2019-10-04 ENCOUNTER — Other Ambulatory Visit: Payer: Medicaid Other

## 2019-10-08 ENCOUNTER — Other Ambulatory Visit: Payer: Self-pay | Admitting: Obstetrics and Gynecology

## 2019-10-08 DIAGNOSIS — O26892 Other specified pregnancy related conditions, second trimester: Secondary | ICD-10-CM

## 2019-10-08 DIAGNOSIS — O0990 Supervision of high risk pregnancy, unspecified, unspecified trimester: Secondary | ICD-10-CM

## 2019-10-08 DIAGNOSIS — R519 Headache, unspecified: Secondary | ICD-10-CM

## 2019-10-08 MED ORDER — BUTALBITAL-APAP-CAFFEINE 50-325-40 MG PO TABS: 1.0000 | ORAL_TABLET | Freq: Four times a day (QID) | ORAL | 0 refills | Status: DC | PRN

## 2019-10-13 ENCOUNTER — Other Ambulatory Visit: Payer: Self-pay

## 2019-10-13 ENCOUNTER — Encounter: Payer: Self-pay | Admitting: Obstetrics and Gynecology

## 2019-10-13 ENCOUNTER — Ambulatory Visit (INDEPENDENT_AMBULATORY_CARE_PROVIDER_SITE_OTHER): Payer: Medicaid Other | Admitting: Obstetrics and Gynecology

## 2019-10-13 VITALS — BP 122/76 | Wt 195.0 lb

## 2019-10-13 DIAGNOSIS — Z1379 Encounter for other screening for genetic and chromosomal anomalies: Secondary | ICD-10-CM

## 2019-10-13 DIAGNOSIS — Z8632 Personal history of gestational diabetes: Secondary | ICD-10-CM

## 2019-10-13 DIAGNOSIS — O0992 Supervision of high risk pregnancy, unspecified, second trimester: Secondary | ICD-10-CM

## 2019-10-13 DIAGNOSIS — O09299 Supervision of pregnancy with other poor reproductive or obstetric history, unspecified trimester: Secondary | ICD-10-CM

## 2019-10-13 DIAGNOSIS — Z3A15 15 weeks gestation of pregnancy: Secondary | ICD-10-CM | POA: Diagnosis not present

## 2019-10-13 DIAGNOSIS — O0990 Supervision of high risk pregnancy, unspecified, unspecified trimester: Secondary | ICD-10-CM | POA: Diagnosis not present

## 2019-10-13 DIAGNOSIS — Z6834 Body mass index (BMI) 34.0-34.9, adult: Secondary | ICD-10-CM

## 2019-10-13 DIAGNOSIS — O09292 Supervision of pregnancy with other poor reproductive or obstetric history, second trimester: Secondary | ICD-10-CM

## 2019-10-13 DIAGNOSIS — O99210 Obesity complicating pregnancy, unspecified trimester: Secondary | ICD-10-CM

## 2019-10-13 DIAGNOSIS — O99212 Obesity complicating pregnancy, second trimester: Secondary | ICD-10-CM
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
## 2019-10-18 LAB — MATERNIT 21 PLUS CORE, BLOOD

## 2019-10-19 LAB — MATERNIT 21 PLUS CORE, BLOOD
Fetal Fraction: 6
Result (T21): NEGATIVE
Trisomy 13 (Patau syndrome): NEGATIVE
Trisomy 18 (Edwards syndrome): NEGATIVE
Trisomy 21 (Down syndrome): NEGATIVE

## 2019-11-10 ENCOUNTER — Encounter: Payer: Self-pay | Admitting: Obstetrics and Gynecology

## 2019-11-10 ENCOUNTER — Other Ambulatory Visit: Payer: Self-pay

## 2019-11-10 ENCOUNTER — Ambulatory Visit (INDEPENDENT_AMBULATORY_CARE_PROVIDER_SITE_OTHER): Payer: Medicaid Other | Admitting: Obstetrics and Gynecology

## 2019-11-10 ENCOUNTER — Ambulatory Visit (INDEPENDENT_AMBULATORY_CARE_PROVIDER_SITE_OTHER): Payer: Medicaid Other

## 2019-11-10 VITALS — BP 124/78 | Wt 198.0 lb

## 2019-11-10 DIAGNOSIS — O09292 Supervision of pregnancy with other poor reproductive or obstetric history, second trimester: Secondary | ICD-10-CM

## 2019-11-10 DIAGNOSIS — O09299 Supervision of pregnancy with other poor reproductive or obstetric history, unspecified trimester: Secondary | ICD-10-CM

## 2019-11-10 DIAGNOSIS — O99212 Obesity complicating pregnancy, second trimester: Secondary | ICD-10-CM | POA: Diagnosis not present

## 2019-11-10 DIAGNOSIS — Z6834 Body mass index (BMI) 34.0-34.9, adult: Secondary | ICD-10-CM

## 2019-11-10 DIAGNOSIS — Z3A18 18 weeks gestation of pregnancy: Secondary | ICD-10-CM | POA: Diagnosis not present

## 2019-11-10 DIAGNOSIS — Z363 Encounter for antenatal screening for malformations: Secondary | ICD-10-CM | POA: Diagnosis not present

## 2019-11-10 DIAGNOSIS — O0990 Supervision of high risk pregnancy, unspecified, unspecified trimester: Secondary | ICD-10-CM

## 2019-11-10 DIAGNOSIS — Z8632 Personal history of gestational diabetes: Secondary | ICD-10-CM

## 2019-11-10 DIAGNOSIS — Z3A19 19 weeks gestation of pregnancy: Secondary | ICD-10-CM

## 2019-11-10 DIAGNOSIS — O99210 Obesity complicating pregnancy, unspecified trimester: Secondary | ICD-10-CM

## 2019-11-10 DIAGNOSIS — O0992 Supervision of high risk pregnancy, unspecified, second trimester: Secondary | ICD-10-CM
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
## 2019-12-02 ENCOUNTER — Telehealth: Payer: Self-pay

## 2019-12-02 NOTE — Telephone Encounter (Signed)
Make sure to wash hands frequently and wear a mask to try and avoid getting sick. Many times women are already immune to fifths disease so there is not a problem during the pregnancy. There are some serious complications if you are not immune, but they are rare.

## 2019-12-02 NOTE — Telephone Encounter (Signed)
Pt called triage reporting her son has Fifths Disease, H-flu and Pneumonia  and she was told to let her OB know because this may harm her baby since she is pregnant. What should he do, Please advise

## 2019-12-03 ENCOUNTER — Other Ambulatory Visit: Payer: Self-pay

## 2019-12-03 ENCOUNTER — Other Ambulatory Visit: Payer: Self-pay | Admitting: Certified Nurse Midwife

## 2019-12-03 ENCOUNTER — Other Ambulatory Visit: Payer: Medicaid Other

## 2019-12-03 DIAGNOSIS — Z209 Contact with and (suspected) exposure to unspecified communicable disease: Secondary | ICD-10-CM | POA: Diagnosis not present

## 2019-12-03 NOTE — Telephone Encounter (Signed)
Pt states she is not feeling well today head hurts throat hurts and chest hurts, she may have got the flu from her son, I told her to take Tylenol and drink plenty of fluids, any other advice? Does she need to be tested for the flu?

## 2019-12-03 NOTE — Telephone Encounter (Signed)
She states she is burning up, she may have a fever

## 2019-12-03 NOTE — Telephone Encounter (Signed)
Pt states she is going to go to urgent care

## 2019-12-03 NOTE — Telephone Encounter (Signed)
Is she wanting to go to urgent care or ER?

## 2019-12-03 NOTE — Telephone Encounter (Signed)
Please have her come in to be tested to see if she is immune to Fifth's disease and I can check her out-say around [phone]

## 2019-12-06 LAB — PARVOVIRUS B19 ANTIBODY, IGG AND IGM
Parvovirus B19 IgG: 5 index — ABNORMAL HIGH (ref 0.0–0.8)
Parvovirus B19 IgM: 0.1 index (ref 0.0–0.8)

## 2019-12-06 NOTE — Telephone Encounter (Signed)
Patient reports she is still pretty sick. She's pretty miserable. Her throat is hurting pretty bad and chest is starting to hurt. She is inquiring if she should go to Urgent Care or ED. Her next ROB isn't until Wed. PQ#[phone]

## 2019-12-06 NOTE — Telephone Encounter (Signed)
Spoke w/[facility] who advises pt to go to Urgent Care to be tested for Flu & Covid if needed. Patient aware.

## 2019-12-07 ENCOUNTER — Ambulatory Visit: Payer: Medicaid Other

## 2019-12-07 ENCOUNTER — Ambulatory Visit
Admission: EM | Admit: 2019-12-07 | Discharge: 2019-12-07 | Disposition: A | Discharge status: Home / Self Care | Payer: Medicaid Other | Attending: Family Medicine | Admitting: Family Medicine

## 2019-12-07 ENCOUNTER — Other Ambulatory Visit: Payer: Self-pay

## 2019-12-07 ENCOUNTER — Telehealth: Payer: Self-pay | Admitting: Certified Nurse Midwife

## 2019-12-07 DIAGNOSIS — B349 Viral infection, unspecified: Secondary | ICD-10-CM

## 2019-12-07 DIAGNOSIS — Z20822 Contact with and (suspected) exposure to covid-19: Secondary | ICD-10-CM

## 2019-12-07 DIAGNOSIS — R05 Cough: Secondary | ICD-10-CM | POA: Diagnosis not present

## 2019-12-07 DIAGNOSIS — R079 Chest pain, unspecified: Secondary | ICD-10-CM | POA: Diagnosis not present

## 2019-12-07 DIAGNOSIS — Z0189 Encounter for other specified special examinations: Secondary | ICD-10-CM | POA: Diagnosis not present

## 2019-12-07 DIAGNOSIS — J069 Acute upper respiratory infection, unspecified: Secondary | ICD-10-CM | POA: Diagnosis not present

## 2019-12-07 DIAGNOSIS — M940 Chondrocostal junction syndrome [Tietze]: Secondary | ICD-10-CM | POA: Diagnosis not present

## 2019-12-07 DIAGNOSIS — R509 Fever, unspecified: Secondary | ICD-10-CM | POA: Diagnosis not present

## 2019-12-07 LAB — SARS CORONAVIRUS 2 (TAT 6-24 HRS): SARS Coronavirus 2: NEGATIVE

## 2019-12-07 LAB — GROUP A STREP BY PCR: Group A Strep by PCR: NOT DETECTED

## 2019-12-07 MED ORDER — AZITHROMYCIN 250 MG PO TABS: 250.0000 mg | ORAL_TABLET | Freq: Every day | ORAL | 0 refills | Status: DC

## 2019-12-07 NOTE — ED Triage Notes (Signed)
Patient states that she has a cough, fever, chest and rib pain, sinus pain and pressure since Thursday. States that 10 days ago her infant at home was at [facility] with Influenza H. And Pneumonia. Patient states that she is currently 23 weeks and 2 days pregnant. States that [facility] advised her to come here and be checked for Pneumonia and Flu. Patient states that she has not been vaccinated against Covid-19 and has not been tested recently.

## 2019-12-07 NOTE — Discharge Instructions (Addendum)
It was very nice seeing you today in clinic. Thank you for entrusting me with your care.   Rest and Stay HYDRATED. Water and electrolyte containing beverages (Gatorade, Pedialyte) are best to prevent dehydration and electrolyte abnormalities. Use Tylenol and Robitussin as needed.   You were tested for SARS-CoV-2 (novel coronavirus) today. Testing is being processed at the main campus of [facility] in [place], and have been taking 12-24 hours to come back. Current recommendations from the the CDC and [place] DHHS require that you remain out of work in order to quarantine at home until negative test results are have been received. In the event that your test results are positive, you will be contacted with further directives. These measures are being implemented out of an abundance of caution to prevent transmission and spread during the current SARS-CoV-2 pandemic.  Make arrangements to follow up with your regular doctor in 1 week for re-evaluation if not improving.If your symptoms/condition worsens, please seek follow up care either here or in the ER. Please remember, our [facility] providers are "right here with you" when you need [place].   Again, it was my pleasure to take care of you today. Thank you for choosing our clinic. I hope that you start to feel better quickly.   Quentin Mulling, MSN, APRN, FNP-C, CEN Advanced Practice Provider [facility]

## 2019-12-07 NOTE — Telephone Encounter (Signed)
Called patient with Parvovirus results. She is immune to Fifth's disease. Whole family has been ill and she has had fever, chest pain, sore throat. Has been advised to be seen in Urgent Care or ER. Advised to go to [facility] at [facility] for further evaluation. One year old has pneumonia and H. Influenza. Farrel Conners, CNM

## 2019-12-08 ENCOUNTER — Ambulatory Visit (INDEPENDENT_AMBULATORY_CARE_PROVIDER_SITE_OTHER): Payer: Medicaid Other | Admitting: Obstetrics and Gynecology

## 2019-12-08 ENCOUNTER — Encounter: Payer: Self-pay | Admitting: Obstetrics and Gynecology

## 2019-12-08 VITALS — BP 122/70 | Wt 198.0 lb

## 2019-12-08 DIAGNOSIS — O0992 Supervision of high risk pregnancy, unspecified, second trimester: Secondary | ICD-10-CM

## 2019-12-08 DIAGNOSIS — Z131 Encounter for screening for diabetes mellitus: Secondary | ICD-10-CM

## 2019-12-08 DIAGNOSIS — Z113 Encounter for screening for infections with a predominantly sexual mode of transmission: Secondary | ICD-10-CM

## 2019-12-08 DIAGNOSIS — Z8632 Personal history of gestational diabetes: Secondary | ICD-10-CM

## 2019-12-08 DIAGNOSIS — Z3A23 23 weeks gestation of pregnancy: Secondary | ICD-10-CM

## 2019-12-08 DIAGNOSIS — O99212 Obesity complicating pregnancy, second trimester: Secondary | ICD-10-CM

## 2019-12-08 DIAGNOSIS — O09299 Supervision of pregnancy with other poor reproductive or obstetric history, unspecified trimester: Secondary | ICD-10-CM

## 2019-12-08 DIAGNOSIS — Z6834 Body mass index (BMI) 34.0-34.9, adult: Secondary | ICD-10-CM

## 2019-12-08 LAB — MISC LABCORP TEST (SEND OUT): Labcorp test code: 138271
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
## 2019-12-28 ENCOUNTER — Telehealth: Payer: Self-pay

## 2019-12-28 NOTE — Telephone Encounter (Signed)
Pt called triage reporting bad cramps and pelvic pain for 2 days, rash on right side of her stomach its painful and itchy. Rash started 1 week ago , pt scheduled for tomorrow at 9:30 with Dr Jerene Pitch

## 2019-12-29 ENCOUNTER — Ambulatory Visit (INDEPENDENT_AMBULATORY_CARE_PROVIDER_SITE_OTHER): Payer: Medicaid Other | Admitting: Obstetrics and Gynecology

## 2019-12-29 ENCOUNTER — Other Ambulatory Visit: Payer: Self-pay

## 2019-12-29 ENCOUNTER — Encounter: Payer: Self-pay | Admitting: Obstetrics and Gynecology

## 2019-12-29 ENCOUNTER — Other Ambulatory Visit: Payer: Self-pay | Admitting: Obstetrics and Gynecology

## 2019-12-29 VITALS — BP 112/72 | Wt 201.2 lb

## 2019-12-29 DIAGNOSIS — O0992 Supervision of high risk pregnancy, unspecified, second trimester: Secondary | ICD-10-CM

## 2019-12-29 DIAGNOSIS — O0990 Supervision of high risk pregnancy, unspecified, unspecified trimester: Secondary | ICD-10-CM

## 2019-12-29 DIAGNOSIS — Z3A26 26 weeks gestation of pregnancy: Secondary | ICD-10-CM | POA: Diagnosis not present

## 2019-12-29 DIAGNOSIS — B0233 Zoster keratitis: Secondary | ICD-10-CM

## 2019-12-29 LAB — POCT URINALYSIS DIPSTICK OB
Glucose, UA: NEGATIVE
POC,PROTEIN,UA: NEGATIVE

## 2019-12-29 MED ORDER — VALACYCLOVIR HCL 1 G PO TABS: 1000.0000 mg | ORAL_TABLET | Freq: Three times a day (TID) | ORAL | 0 refills | Status: AC
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
## 2019-12-29 NOTE — Patient Instructions (Signed)
Third Trimester of Pregnancy The third trimester is from week 28 through week 40 (months 7 through 9). The third trimester is a time when the unborn baby (fetus) is growing rapidly. At the end of the ninth month, the fetus is about 20 inches in length and weighs 6-10 pounds. Body changes during your third trimester Your body will continue to go through many changes during pregnancy. The changes vary from woman to woman. During the third trimester:  Your weight will continue to increase. You can expect to gain 25-35 pounds (11-16 kg) by the end of the pregnancy.  You may begin to get stretch marks on your hips, abdomen, and breasts.  You may urinate more often because the fetus is moving lower into your pelvis and pressing on your bladder.  You may develop or continue to have heartburn. This is caused by increased hormones that slow down muscles in the digestive tract.  You may develop or continue to have constipation because increased hormones slow digestion and cause the muscles that push waste through your intestines to relax.  You may develop hemorrhoids. These are swollen veins (varicose veins) in the rectum that can itch or be painful.  You may develop swollen, bulging veins (varicose veins) in your legs.  You may have increased body aches in the pelvis, back, or thighs. This is due to weight gain and increased hormones that are relaxing your joints.  You may have changes in your hair. These can include thickening of your hair, rapid growth, and changes in texture. Some women also have hair loss during or after pregnancy, or hair that feels dry or thin. Your hair will most likely return to normal after your baby is born.  Your breasts will continue to grow and they will continue to become tender. A yellow fluid (colostrum) may leak from your breasts. This is the first milk you are producing for your baby.  Your belly button may stick out.  You may notice more swelling in your hands,  face, or ankles.  You may have increased tingling or numbness in your hands, arms, and legs. The skin on your belly may also feel numb.  You may feel short of breath because of your expanding uterus.  You may have more problems sleeping. This can be caused by the size of your belly, increased need to urinate, and an increase in your body's metabolism.  You may notice the fetus "dropping," or moving lower in your abdomen (lightening).  You may have increased vaginal discharge.  You may notice your joints feel loose and you may have pain around your pelvic bone. What to expect at prenatal visits You will have prenatal exams every 2 weeks until week 36. Then you will have weekly prenatal exams. During a routine prenatal visit:  You will be weighed to make sure you and the baby are growing normally.  Your blood pressure will be taken.  Your abdomen will be measured to track your baby's growth.  The fetal heartbeat will be listened to.  Any test results from the previous visit will be discussed.  You may have a cervical check near your due date to see if your cervix has softened or thinned (effaced).  You will be tested for Group B streptococcus. This happens between 35 and 37 weeks. Your health care provider may ask you:  What your birth plan is.  How you are feeling.  If you are feeling the baby move.  If you have had any abnormal   symptoms, such as leaking fluid, bleeding, severe headaches, or abdominal cramping.  If you are using any tobacco products, including cigarettes, chewing tobacco, and electronic cigarettes.  If you have any questions. Other tests or screenings that may be performed during your third trimester include:  Blood tests that check for low iron levels (anemia).  Fetal testing to check the health, activity level, and growth of the fetus. Testing is done if you have certain medical conditions or if there are problems during the pregnancy.  Nonstress test  (NST). This test checks the health of your baby to make sure there are no signs of problems, such as the baby not getting enough oxygen. During this test, a belt is placed around your belly. The baby is made to move, and its heart rate is monitored during movement. What is false labor? False labor is a condition in which you feel small, irregular tightenings of the muscles in the womb (contractions) that usually go away with rest, changing position, or drinking water. These are called Braxton Hicks contractions. Contractions may last for hours, days, or even weeks before true labor sets in. If contractions come at regular intervals, become more frequent, increase in intensity, or become painful, you should see your health care provider. What are the signs of labor?  Abdominal cramps.  Regular contractions that start at 10 minutes apart and become stronger and more frequent with time.  Contractions that start on the top of the uterus and spread down to the lower abdomen and back.  Increased pelvic pressure and dull back pain.  A watery or bloody mucus discharge that comes from the vagina.  Leaking of amniotic fluid. This is also known as your "water breaking." It could be a slow trickle or a gush. Let your health care provider know if it has a color or strange odor. If you have any of these signs, call your health care provider right away, even if it is before your due date. Follow these instructions at home: Medicines  Follow your health care provider's instructions regarding medicine use. Specific medicines may be either safe or unsafe to take during pregnancy.  Take a prenatal vitamin that contains at least 600 micrograms (mcg) of folic acid.  If you develop constipation, try taking a stool softener if your health care provider approves. Eating and drinking   Eat a balanced diet that includes fresh fruits and vegetables, whole grains, good sources of protein such as meat, eggs, or tofu,  and low-fat dairy. Your health care provider will help you determine the amount of weight gain that is right for you.  Avoid raw meat and uncooked cheese. These carry germs that can cause birth defects in the baby.  If you have low calcium intake from food, talk to your health care provider about whether you should take a daily calcium supplement.  Eat four or five small meals rather than three large meals a day.  Limit foods that are high in fat and processed sugars, such as fried and sweet foods.  To prevent constipation: ? Drink enough fluid to keep your urine clear or pale yellow. ? Eat foods that are high in fiber, such as fresh fruits and vegetables, whole grains, and beans. Activity  Exercise only as directed by your health care provider. Most women can continue their usual exercise routine during pregnancy. Try to exercise for 30 minutes at least 5 days a week. Stop exercising if you experience uterine contractions.  Avoid heavy lifting.  Do   not exercise in extreme heat or humidity, or at high altitudes.  Wear low-heel, comfortable shoes.  Practice good posture.  You may continue to have sex unless your health care provider tells you otherwise. Relieving pain and discomfort  Take frequent breaks and rest with your legs elevated if you have leg cramps or low back pain.  Take warm sitz baths to soothe any pain or discomfort caused by hemorrhoids. Use hemorrhoid cream if your health care provider approves.  Wear a good support bra to prevent discomfort from breast tenderness.  If you develop varicose veins: ? Wear support pantyhose or compression stockings as told by your healthcare provider. ? Elevate your feet for 15 minutes, 3-4 times a day. Prenatal care  Write down your questions. Take them to your prenatal visits.  Keep all your prenatal visits as told by your health care provider. This is important. Safety  Wear your seat belt at all times when driving.  Make  a list of emergency phone numbers, including numbers for family, friends, the hospital, and police and fire departments. General instructions  Avoid cat litter boxes and soil used by cats. These carry germs that can cause birth defects in the baby. If you have a cat, ask someone to clean the litter box for you.  Do not travel far distances unless it is absolutely necessary and only with the approval of your health care provider.  Do not use hot tubs, steam rooms, or saunas.  Do not drink alcohol.  Do not use any products that contain nicotine or tobacco, such as cigarettes and e-cigarettes. If you need help quitting, ask your health care provider.  Do not use any medicinal herbs or unprescribed drugs. These chemicals affect the formation and growth of the baby.  Do not douche or use tampons or scented sanitary pads.  Do not cross your legs for long periods of time.  To prepare for the arrival of your baby: ? Take prenatal classes to understand, practice, and ask questions about labor and delivery. ? Make a trial run to the hospital. ? Visit the hospital and tour the maternity area. ? Arrange for maternity or paternity leave through employers. ? Arrange for family and friends to take care of pets while you are in the hospital. ? Purchase a rear-facing car seat and make sure you know how to install it in your car. ? Pack your hospital bag. ? Prepare the baby's nursery. Make sure to remove all pillows and stuffed animals from the baby's crib to prevent suffocation.  Visit your dentist if you have not gone during your pregnancy. Use a soft toothbrush to brush your teeth and be gentle when you floss. Contact a health care provider if:  You are unsure if you are in labor or if your water has broken.  You become dizzy.  You have mild pelvic cramps, pelvic pressure, or nagging pain in your abdominal area.  You have lower back pain.  You have persistent nausea, vomiting, or  diarrhea.  You have an unusual or bad smelling vaginal discharge.  You have pain when you urinate. Get help right away if:  Your water breaks before 37 weeks.  You have regular contractions less than 5 minutes apart before 37 weeks.  You have a fever.  You are leaking fluid from your vagina.  You have spotting or bleeding from your vagina.  You have severe abdominal pain or cramping.  You have rapid weight loss or weight gain.  You have   shortness of breath with chest pain.  You notice sudden or extreme swelling of your face, hands, ankles, feet, or legs.  Your baby makes fewer than 10 movements in 2 hours.  You have severe headaches that do not go away when you take medicine.  You have vision changes. Summary  The third trimester is from week 28 through week 40, months 7 through 9. The third trimester is a time when the unborn baby (fetus) is growing rapidly.  During the third trimester, your discomfort may increase as you and your baby continue to gain weight. You may have abdominal, leg, and back pain, sleeping problems, and an increased need to urinate.  During the third trimester your breasts will keep growing and they will continue to become tender. A yellow fluid (colostrum) may leak from your breasts. This is the first milk you are producing for your baby.  False labor is a condition in which you feel small, irregular tightenings of the muscles in the womb (contractions) that eventually go away. These are called Braxton Hicks contractions. Contractions may last for hours, days, or even weeks before true labor sets in.  Signs of labor can include: abdominal cramps; regular contractions that start at 10 minutes apart and become stronger and more frequent with time; watery or bloody mucus discharge that comes from the vagina; increased pelvic pressure and dull back pain; and leaking of amniotic fluid. This information is not intended to replace advice given to you by your  health care provider. Make sure you discuss any questions you have with your health care provider. Document Revised: 11/12/2018 Document Reviewed: 08/27/2016 Elsevier Patient Education  2020 [employer]  

## 2020-01-10 ENCOUNTER — Other Ambulatory Visit: Payer: Self-pay

## 2020-01-10 ENCOUNTER — Other Ambulatory Visit: Payer: Medicaid Other

## 2020-01-10 ENCOUNTER — Ambulatory Visit (INDEPENDENT_AMBULATORY_CARE_PROVIDER_SITE_OTHER): Payer: Medicaid Other | Admitting: Obstetrics and Gynecology

## 2020-01-10 ENCOUNTER — Encounter: Payer: Self-pay | Admitting: Obstetrics and Gynecology

## 2020-01-10 VITALS — BP 122/74 | Wt 203.0 lb

## 2020-01-10 DIAGNOSIS — O0992 Supervision of high risk pregnancy, unspecified, second trimester: Secondary | ICD-10-CM

## 2020-01-10 DIAGNOSIS — Z113 Encounter for screening for infections with a predominantly sexual mode of transmission: Secondary | ICD-10-CM | POA: Diagnosis not present

## 2020-01-10 DIAGNOSIS — Z3A28 28 weeks gestation of pregnancy: Secondary | ICD-10-CM

## 2020-01-10 DIAGNOSIS — Z8632 Personal history of gestational diabetes: Secondary | ICD-10-CM

## 2020-01-10 DIAGNOSIS — Z131 Encounter for screening for diabetes mellitus: Secondary | ICD-10-CM

## 2020-01-10 DIAGNOSIS — O99212 Obesity complicating pregnancy, second trimester: Secondary | ICD-10-CM

## 2020-01-10 DIAGNOSIS — O99213 Obesity complicating pregnancy, third trimester: Secondary | ICD-10-CM

## 2020-01-10 DIAGNOSIS — O0993 Supervision of high risk pregnancy, unspecified, third trimester: Secondary | ICD-10-CM

## 2020-01-10 DIAGNOSIS — O09299 Supervision of pregnancy with other poor reproductive or obstetric history, unspecified trimester: Secondary | ICD-10-CM
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
## 2020-01-11 LAB — 28 WEEK RH+PANEL
Basophils Absolute: 0 10*3/uL (ref 0.0–0.2)
Basos: 0 %
EOS (ABSOLUTE): 0 10*3/uL (ref 0.0–0.4)
Eos: 0 %
Gestational Diabetes Screen: 103 mg/dL (ref 65–139)
HIV Screen 4th Generation wRfx: NONREACTIVE
Hematocrit: 31.3 % — ABNORMAL LOW (ref 34.0–46.6)
Hemoglobin: 10.5 g/dL — ABNORMAL LOW (ref 11.1–15.9)
Immature Grans (Abs): 0 10*3/uL (ref 0.0–0.1)
Immature Granulocytes: 0 %
Lymphocytes Absolute: 1.5 10*3/uL (ref 0.7–3.1)
Lymphs: 17 %
MCH: 29 pg (ref 26.6–33.0)
MCHC: 33.5 g/dL (ref 31.5–35.7)
MCV: 87 fL (ref 79–97)
Monocytes Absolute: 0.5 10*3/uL (ref 0.1–0.9)
Monocytes: 6 %
Neutrophils Absolute: 6.8 10*3/uL (ref 1.4–7.0)
Neutrophils: 77 %
Platelets: 227 10*3/uL (ref 150–450)
RBC: 3.62 x10E6/uL — ABNORMAL LOW (ref 3.77–5.28)
RDW: 13.4 % (ref 11.7–15.4)
RPR Ser Ql: NONREACTIVE
WBC: 8.9 10*3/uL (ref 3.4–10.8)

## 2020-01-23 ENCOUNTER — Encounter: Payer: Self-pay | Admitting: Obstetrics and Gynecology

## 2020-01-23 ENCOUNTER — Other Ambulatory Visit: Payer: Self-pay

## 2020-01-23 ENCOUNTER — Observation Stay
Admission: EM | Admit: 2020-01-23 | Discharge: 2020-01-23 | Disposition: A | Discharge status: Home / Self Care | Payer: Medicaid Other | Attending: Certified Nurse Midwife | Admitting: Certified Nurse Midwife

## 2020-01-23 DIAGNOSIS — F419 Anxiety disorder, unspecified: Secondary | ICD-10-CM | POA: Diagnosis not present

## 2020-01-23 DIAGNOSIS — O26893 Other specified pregnancy related conditions, third trimester: Secondary | ICD-10-CM | POA: Diagnosis not present

## 2020-01-23 DIAGNOSIS — F329 Major depressive disorder, single episode, unspecified: Secondary | ICD-10-CM | POA: Diagnosis not present

## 2020-01-23 DIAGNOSIS — O99343 Other mental disorders complicating pregnancy, third trimester: Secondary | ICD-10-CM | POA: Diagnosis not present

## 2020-01-23 DIAGNOSIS — Z3A30 30 weeks gestation of pregnancy: Secondary | ICD-10-CM | POA: Insufficient documentation

## 2020-01-23 DIAGNOSIS — Z6834 Body mass index (BMI) 34.0-34.9, adult: Secondary | ICD-10-CM

## 2020-01-23 DIAGNOSIS — O99213 Obesity complicating pregnancy, third trimester: Secondary | ICD-10-CM | POA: Insufficient documentation

## 2020-01-23 DIAGNOSIS — O0990 Supervision of high risk pregnancy, unspecified, unspecified trimester: Secondary | ICD-10-CM

## 2020-01-23 DIAGNOSIS — O219 Vomiting of pregnancy, unspecified: Secondary | ICD-10-CM | POA: Diagnosis not present

## 2020-01-23 DIAGNOSIS — R42 Dizziness and giddiness: Secondary | ICD-10-CM

## 2020-01-23 DIAGNOSIS — R519 Headache, unspecified: Secondary | ICD-10-CM | POA: Diagnosis not present

## 2020-01-23 LAB — COMPREHENSIVE METABOLIC PANEL
ALT: 13 U/L (ref 0–44)
AST: 15 U/L (ref 15–41)
Albumin: 3.2 g/dL — ABNORMAL LOW (ref 3.5–5.0)
Alkaline Phosphatase: 71 U/L (ref 38–126)
Anion gap: 8 (ref 5–15)
BUN: <5 mg/dL — ABNORMAL LOW (ref 6–20)
CO2: 23 mmol/L (ref 22–32)
Calcium: 8.8 mg/dL — ABNORMAL LOW (ref 8.9–10.3)
Chloride: 106 mmol/L (ref 98–111)
Creatinine, Ser: 0.42 mg/dL — ABNORMAL LOW (ref 0.44–1.00)
GFR calc Af Amer: >60 mL/min (ref >60)
GFR calc non Af Amer: >60 mL/min (ref >60)
Glucose, Bld: 86 mg/dL (ref 70–99)
Potassium: 3.6 mmol/L (ref 3.5–5.1)
Sodium: 137 mmol/L (ref 135–145)
Total Bilirubin: 0.8 mg/dL (ref 0.3–1.2)
Total Protein: 6.5 g/dL (ref 6.5–8.1)

## 2020-01-23 LAB — CBC
HCT: 31.5 % — ABNORMAL LOW (ref 36.0–46.0)
Hemoglobin: 11.1 g/dL — ABNORMAL LOW (ref 12.0–15.0)
MCH: 29.5 pg (ref 26.0–34.0)
MCHC: 35.2 g/dL (ref 30.0–36.0)
MCV: 83.8 fL (ref 80.0–100.0)
Platelets: 225 10*3/uL (ref 150–400)
RBC: 3.76 MIL/uL — ABNORMAL LOW (ref 3.87–5.11)
RDW: 14.3 % (ref 11.5–15.5)
WBC: 8.3 10*3/uL (ref 4.0–10.5)
nRBC: 0 % (ref 0.0–0.2)

## 2020-01-23 LAB — URINALYSIS, COMPLETE (UACMP) WITH MICROSCOPIC
Bacteria, UA: NONE SEEN
Bilirubin Urine: NEGATIVE
Glucose, UA: NEGATIVE mg/dL
Hgb urine dipstick: NEGATIVE
Ketones, ur: NEGATIVE mg/dL
Leukocytes,Ua: NEGATIVE
Nitrite: NEGATIVE
Protein, ur: NEGATIVE mg/dL
Specific Gravity, Urine: 1.017 (ref 1.005–1.030)
pH: 7 (ref 5.0–8.0)

## 2020-01-23 MED ORDER — LACTATED RINGERS IV SOLN: INTRAVENOUS | Status: DC

## 2020-01-23 MED ORDER — MECLIZINE HCL 25 MG PO TABS: 25.0000 mg | ORAL_TABLET | Freq: Three times a day (TID) | ORAL | 0 refills | Status: DC | PRN

## 2020-01-23 MED ORDER — LACTATED RINGERS IV BOLUS
500.0000 mL | Freq: Once | INTRAVENOUS | Status: AC
  Administered 2020-01-23: 500 mL via INTRAVENOUS

## 2020-01-23 MED ORDER — ACETAMINOPHEN 325 MG PO TABS: 650.0000 mg | ORAL_TABLET | Freq: Four times a day (QID) | ORAL | Status: DC | PRN

## 2020-01-23 MED ORDER — ONDANSETRON HCL 4 MG/2ML IJ SOLN
4.0000 mg | Freq: Four times a day (QID) | INTRAMUSCULAR | Status: DC | PRN
  Administered 2020-01-23: 4 mg via INTRAVENOUS
  Filled 2020-01-23: qty 2

## 2020-01-23 MED ORDER — MECLIZINE HCL 25 MG PO TABS
25.0000 mg | ORAL_TABLET | Freq: Three times a day (TID) | ORAL | Status: DC | PRN
  Administered 2020-01-23: 25 mg via ORAL
  Filled 2020-01-23 (×2): qty 1

## 2020-01-23 NOTE — Discharge Instructions (Signed)
Dizziness Dizziness is a common problem. It makes you feel unsteady or light-headed. You may feel like you are about to pass out (faint). Dizziness can lead to getting hurt if you stumble or fall. Dizziness can be caused by many things, including:  Medicines.  Not having enough water in your body (dehydration).  Illness. Follow these instructions at home: Eating and drinking   Drink enough fluid to keep your pee (urine) clear or pale yellow. This helps to keep you from getting dehydrated. Try to drink more clear fluids, such as water.  Do not drink alcohol.  Limit how much caffeine you drink or eat, if your doctor tells you to do that.  Limit how much salt (sodium) you drink or eat, if your doctor tells you to do that. Activity   Avoid making quick movements. ? When you stand up from sitting in a chair, steady yourself until you feel okay. ? In the morning, first sit up on the side of the bed. When you feel okay, stand slowly while you hold onto something. Do this until you know that your balance is fine.  If you need to stand in one place for a long time, move your legs often. Tighten and relax the muscles in your legs while you are standing.  Do not drive or use heavy machinery if you feel dizzy.  Avoid bending down if you feel dizzy. Place items in your home so you can reach them easily without leaning over. Lifestyle  Do not use any products that contain nicotine or tobacco, such as cigarettes and e-cigarettes. If you need help quitting, ask your doctor.  Try to lower your stress level. You can do this by using methods such as yoga or meditation. Talk with your doctor if you need help. General instructions  Watch your dizziness for any changes.  Take over-the-counter and prescription medicines only as told by your doctor. Talk with your doctor if you think that you are dizzy because of a medicine that you are taking.  Tell a friend or a family member that you are feeling  dizzy. If he or she notices any changes in your behavior, have this person call your doctor.  Keep all follow-up visits as told by your doctor. This is important. Contact a doctor if:  Your dizziness does not go away.  Your dizziness or light-headedness gets worse.  You feel sick to your stomach (nauseous).  You have trouble hearing.  You have new symptoms.  You are unsteady on your feet.  You feel like the room is spinning. Get help right away if:  You throw up (vomit) or have watery poop (diarrhea), and you cannot eat or drink anything.  You have trouble: ? Talking. ? Walking. ? Swallowing. ? Using your arms, hands, or legs.  You feel generally weak.  You are not thinking clearly, or you have trouble forming sentences. A friend or family member may notice this.  You have: ? Chest pain. ? Pain in your belly (abdomen). ? Shortness of breath. ? Sweating.  Your vision changes.  You are bleeding.  You have a very bad headache.  You have neck pain or a stiff neck.  You have a fever. These symptoms may be an emergency. Do not wait to see if the symptoms will go away. Get medical help right away. Call your local emergency services (911 in the [place] Do not drive yourself to the hospital. Summary  Dizziness makes you feel unsteady or light-headed. You  may feel like you are about to pass out (faint).  Drink enough fluid to keep your pee (urine) clear or pale yellow. Do not drink alcohol.  Avoid making quick movements if you feel dizzy.  Watch your dizziness for any changes. This information is not intended to replace advice given to you by your health care provider. Make sure you discuss any questions you have with your health care provider. Document Revised: 07/25/2017 Document Reviewed: 08/08/2016 Elsevier Patient Education  2020 [place] Diet A bland diet consists of foods that are often soft and do not have a lot of fat, fiber, or extra seasonings.  Foods without fat, fiber, or seasoning are easier for the body to digest. They are also less likely to irritate your mouth, throat, stomach, and other parts of your digestive system. A bland diet is sometimes called a BRAT diet. What is my plan? Your health care provider or food and nutrition specialist (dietitian) may recommend specific changes to your diet to prevent symptoms or to treat your symptoms. These changes may include:  Eating small meals often.  Cooking food until it is soft enough to chew easily.  Chewing your food well.  Drinking fluids slowly.  Not eating foods that are very spicy, sour, or fatty.  Not eating citrus fruits, such as oranges and grapefruit. What do I need to know about this diet?  Eat a variety of foods from the bland diet food list.  Do not follow a bland diet longer than needed.  Ask your health care provider whether you should take vitamins or supplements. What foods can I eat? Grains  Hot cereals, such as cream of wheat. Rice. Bread, crackers, or tortillas made from refined white flour. Vegetables Canned or cooked vegetables. Mashed or boiled potatoes. Fruits  Bananas. Applesauce. Other types of cooked or canned fruit with the skin and seeds removed, such as canned peaches or pears. Meats and other proteins  Scrambled eggs. Creamy peanut butter or other nut butters. Lean, well-cooked meats, such as chicken or fish. Tofu. Soups or broths. Dairy Low-fat dairy products, such as milk, cottage cheese, or yogurt. Beverages  Water. Herbal tea. Apple juice. Fats and oils Mild salad dressings. Canola or olive oil. Sweets and desserts Pudding. Custard. Fruit gelatin. Ice cream. The items listed above may not be a complete list of recommended foods and beverages. Contact a dietitian for more options. What foods are not recommended? Grains Whole grain breads and cereals. Vegetables Raw vegetables. Fruits Raw fruits, especially citrus, berries,  or dried fruits. Dairy Whole fat dairy foods. Beverages Caffeinated drinks. Alcohol. Seasonings and condiments Strongly flavored seasonings or condiments. Hot sauce. Salsa. Other foods Spicy foods. Fried foods. Sour foods, such as pickled or fermented foods. Foods with high sugar content. Foods high in fiber. The items listed above may not be a complete list of foods and beverages to avoid. Contact a dietitian for more information. Summary  A bland diet consists of foods that are often soft and do not have a lot of fat, fiber, or extra seasonings.  Foods without fat, fiber, or seasoning are easier for the body to digest.  Check with your health care provider to see how long you should follow this diet plan. It is not meant to be followed for long periods. This information is not intended to replace advice given to you by your health care provider. Make sure you discuss any questions you have with your health care provider. Document Revised: 08/20/2017 Document  Reviewed: 08/20/2017 Elsevier Patient Education  [employer].

## 2020-01-23 NOTE — Final Progress Note (Signed)
Physician Final Progress Note  Patient ID: Anna Flores MRN: [id number] DOB/AGE: 1990-12-03 29 y.o.  Admit date: 01/23/2020 Admitting provider: Vena Austria, MD/ Gasper Lloyd. Sharen Hones, CNM Discharge date: 01/23/2020   Admission Diagnoses: IUP at 30wk with nausea, vomiting and headache and dizziness.   Discharge Diagnoses:  Same as above.  Consults: None  Significant Findings/ Diagnostic Studies:   Chief Complaint:   HPI:  Anna Flores is a 29 y.o. [phone] female with EDC=04/02/2020 at [redacted]w[redacted]d dated by LMP.  Her pregnancy has been complicated by obesity. .  She presents to L&D with multiple concerns. She reports having a left temporal headache this weekend, and then felt fatigue and dizziness and at times lightheadedness. Just before arrival she started to feel nauseous and vomited multiple times in a 25-30 minute period. She denies fever, earache, sore throat, sinus pain, reflux. Has been having headaches with this pregnancy and has been prescribed Fioricet (last dose last week) and Tylenol (last dose 6/18). Her hemoglobin at the time of her 28 week labs was 10.5 gm/dl and she was started on oral iron supplements.  She has not had any net weight gain this pregnancy.  Prenatal care site: Prenatal care at [facility] has also been remarkable for   [facility] Prenatal Labs  Dating  LMP = 6wk [place] Blood type: O/Positive/-- (01/15 1008)   Genetic Screen  NIPS: normal XX Antibody:Negative (01/15 1008)  Anatomic [place] complete Rubella: 6.27 (01/15 1008)  Varicella: Immune  GTT Early: 124 Third trimester: 103 RPR: Non Reactive (01/15 1008)   Rhogam  not needed HBsAg: Negative (01/15 1008)   TDaP vaccine                       Flu Shot: HIV: Non Reactive (01/15 1008)   Baby Food                                GBS:   Contraception  desires BTL [ ]  consent Pap: NIL 2020  CBB     CS/VBAC    Support Person          Maternal Medical History:   Past Medical History:  Diagnosis  Date  . Abdominal pain 10/26/2013  . Anemia 2013  . Anxiety   . Depression   . History of Papanicolaou smear of cervix 10/02/2016   NEG  . Menometrorrhagia 10/26/2013  . Pregnancy    DELIVERED 08/12/16 - JEG    Past Surgical History:  Procedure Laterality Date  . DIAGNOSTIC LAPAROSCOPY  2018   suspected ectopic pregnancy-[facility]  . TONSILLECTOMY      Allergies  Allergen Reactions  . Amoxicillin Hives  . Hydrocodone Shortness Of Breath and Other (See Comments)    "loss of consciousness"; "felt really weird" when she was given 10 mgm hydrocodone. Did well with 5 mgm tablet  . Reglan [Metoclopramide] Other (See Comments)  . Cherry Hives    Prior to Admission medications   Medication Sig Start Date End Date Taking? Authorizing Provider  ferrous sulfate 325 (65 FE) MG EC tablet Take 325 mg by mouth 3 (three) times daily with meals.   Yes [provider]       2019, CNM  prochlorperazine (COMPAZINE) 10 MG tablet Take 1 tablet (10 mg total) by mouth every 6 (six) hours as needed for nausea or vomiting (headache). 09/28/19 12/07/19  02/06/20, MD  Social History: She  reports that she has never smoked. She has never used smokeless tobacco. She reports that she does not drink alcohol and does not use drugs.  Family History: family history includes Nevi in her sister. She was adopted.   Review of Systems: Negative x 10 systems reviewed except as noted in the HPI.      Physical Exam:  Vital Signs: BP 103/64 (BP Location: Left Arm)   Pulse 82   Temp 98.5 F (36.9 C) (Oral)   LMP 06/27/2019 (Exact Date)  General: gravid WF in NAD  HEENT: normocephalic, atraumatic  Throat: no inflammation seen Heart: regular rate & rhythm.  No murmurs/rubs/gallops Lungs: clear to auscultation bilaterally Abdomen: soft, gravid, non-tender;   Extremities: non-tender, symmetric, trace edema bilaterally.  DTRs: +1  Neurologic: Alert & oriented x 3.    Baseline FHR: baseline 140 with accelerations to 170, moderate variability  Results for orders placed or performed during the hospital encounter of 01/23/20 (from the past 24 hour(s))  CBC     Status: Abnormal   Collection Time: 01/23/20  1:45 PM  Result Value Ref Range   WBC 8.3 4.0 - 10.5 K/uL   RBC 3.76 (L) 3.87 - 5.11 MIL/uL   Hemoglobin 11.1 (L) 12.0 - 15.0 g/dL   HCT 31.5 (L) 36 - 46 %   MCV 83.8 80.0 - 100.0 fL   MCH 29.5 26.0 - 34.0 pg   MCHC 35.2 30.0 - 36.0 g/dL   RDW 14.3 11.5 - 15.5 %   Platelets 225 150 - 400 K/uL   nRBC 0.0 0.0 - 0.2 %  Comprehensive metabolic panel     Status: Abnormal   Collection Time: 01/23/20  1:45 PM  Result Value Ref Range   Sodium 137 135 - 145 mmol/L   Potassium 3.6 3.5 - 5.1 mmol/L   Chloride 106 98 - 111 mmol/L   CO2 23 22 - 32 mmol/L   Glucose, Bld 86 70 - 99 mg/dL   BUN <5 (L) 6 - 20 mg/dL   Creatinine, Ser 0.42 (L) 0.44 - 1.00 mg/dL   Calcium 8.8 (L) 8.9 - 10.3 mg/dL   Total Protein 6.5 6.5 - 8.1 g/dL   Albumin 3.2 (L) 3.5 - 5.0 g/dL   AST 15 15 - 41 U/L   ALT 13 0 - 44 U/L   Alkaline Phosphatase 71 38 - 126 U/L   Total Bilirubin 0.8 0.3 - 1.2 mg/dL   GFR calc non Af Amer >60 >60 mL/min   GFR calc Af Amer >60 >60 mL/min   Anion gap 8 5 - 15  Urinalysis, Complete w Microscopic     Status: Abnormal   Collection Time: 01/23/20  1:47 PM  Result Value Ref Range   Color, Urine YELLOW (A) YELLOW   APPearance CLEAR (A) CLEAR   Specific Gravity, Urine 1.017 1.005 - 1.030   pH 7.0 5.0 - 8.0   Glucose, UA NEGATIVE NEGATIVE mg/dL   Hgb urine dipstick NEGATIVE NEGATIVE   Bilirubin Urine NEGATIVE NEGATIVE   Ketones, ur NEGATIVE NEGATIVE mg/dL   Protein, ur NEGATIVE NEGATIVE mg/dL   Nitrite NEGATIVE NEGATIVE   Leukocytes,Ua NEGATIVE NEGATIVE   RBC / HPF 0-5 0 - 5 RBC/hpf   WBC, UA 0-5 0 - 5 WBC/hpf   Bacteria, UA NONE SEEN NONE SEEN   Squamous Epithelial / LPF 0-5 0 - 5   Mucus PRESENT       Assessment:  Anna Flores is a 29  y.o. [id number] female at [redacted]w[redacted]d with headache, dizziness, lightheadedness and episode of nausea and vomiting  R/O labyrinthitis, R/O viral syndrome  Hospital Course: Patient was admitted for IV fluids, IV antiemetics. After receiving Zofran IV was able to drink some fluids and eat some crackers. She was then given Meclizine for the dizziness and Tylenol for the headache. She did feel somewhat better and requested discharge home. RX for Meclizine sent to pharmacy and she was advised to follow up in the office this week.    Procedures: none  Discharge Condition: stable  Disposition: Discharge disposition: 01-Home or Self Care       Diet: Bland  Discharge Activity: Ambulate in house   Allergies as of 01/23/2020      Reactions   Amoxicillin Hives   Hydrocodone Shortness Of Breath, Other (See Comments)   "loss of consciousness"; "felt really weird" when she was given 10 mgm hydrocodone. Did well with 5 mgm tablet   Reglan [metoclopramide] Other (See Comments)   Cherry Hives      Medication List    TAKE these medications   ferrous sulfate 325 (65 FE) MG EC tablet Take 325 mg by mouth 3 (three) times daily with meals.   meclizine 25 MG tablet Commonly known as: ANTIVERT Take 1 tablet (25 mg total) by mouth 3 (three) times daily as needed for dizziness.        Total time spent taking care of this patient: 30 minutes  Signed: Farrel Conners 01/23/2020

## 2020-01-23 NOTE — Progress Notes (Signed)
Pt discharged home per C.Gutierrez, CNM order. Pt reports improvement in nausea and dizziness. Pt received AVS and discharge instructions. Pt also received preterm labor and bleeding precautions. Pt verbalized understanding. Pt has a follow up appointment scheduled for tomorrow 01/24/2020, pt encouraged to keep appointment. Prescription for Meclizine sent to the pharamcy on file. All questions answered by the RN at this moment. Pt in stable condition and d/c home.

## 2020-01-23 NOTE — OB Triage Note (Signed)
Pt presents to the ED c/o ankle swelling since Thursday, N/V since this morning 1045, light headiness and dizziness. Pt is a 29y y/o [id number] [redacted]w[redacted]d. Pt also reports decreased fetal movement and irregular contractions. Pt denies vaginal bleeding or leaking of fluid.  External monitors applied and assessing. VS WNL

## 2020-01-24 ENCOUNTER — Ambulatory Visit (INDEPENDENT_AMBULATORY_CARE_PROVIDER_SITE_OTHER): Payer: Medicaid Other | Admitting: Obstetrics and Gynecology

## 2020-01-24 ENCOUNTER — Encounter: Payer: Self-pay | Admitting: Obstetrics and Gynecology

## 2020-01-24 VITALS — BP 120/74 | Wt 206.0 lb

## 2020-01-24 DIAGNOSIS — O09299 Supervision of pregnancy with other poor reproductive or obstetric history, unspecified trimester: Secondary | ICD-10-CM

## 2020-01-24 DIAGNOSIS — O0993 Supervision of high risk pregnancy, unspecified, third trimester: Secondary | ICD-10-CM

## 2020-01-24 DIAGNOSIS — Z8632 Personal history of gestational diabetes: Secondary | ICD-10-CM

## 2020-01-24 DIAGNOSIS — Z3A30 30 weeks gestation of pregnancy: Secondary | ICD-10-CM

## 2020-01-24 DIAGNOSIS — O99213 Obesity complicating pregnancy, third trimester: Secondary | ICD-10-CM

## 2020-01-24 DIAGNOSIS — O219 Vomiting of pregnancy, unspecified: Secondary | ICD-10-CM
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
## 2020-02-10 ENCOUNTER — Other Ambulatory Visit: Payer: Self-pay

## 2020-02-10 ENCOUNTER — Ambulatory Visit (INDEPENDENT_AMBULATORY_CARE_PROVIDER_SITE_OTHER): Payer: Medicaid Other | Admitting: Obstetrics and Gynecology

## 2020-02-10 ENCOUNTER — Encounter: Payer: Self-pay | Admitting: Obstetrics and Gynecology

## 2020-02-10 DIAGNOSIS — O99013 Anemia complicating pregnancy, third trimester: Secondary | ICD-10-CM | POA: Diagnosis not present

## 2020-02-10 DIAGNOSIS — O0993 Supervision of high risk pregnancy, unspecified, third trimester: Secondary | ICD-10-CM

## 2020-02-10 DIAGNOSIS — E669 Obesity, unspecified: Secondary | ICD-10-CM

## 2020-02-10 DIAGNOSIS — O26893 Other specified pregnancy related conditions, third trimester: Secondary | ICD-10-CM

## 2020-02-10 DIAGNOSIS — O99213 Obesity complicating pregnancy, third trimester: Secondary | ICD-10-CM

## 2020-02-10 DIAGNOSIS — M25559 Pain in unspecified hip: Secondary | ICD-10-CM

## 2020-02-10 DIAGNOSIS — Z3A32 32 weeks gestation of pregnancy: Secondary | ICD-10-CM

## 2020-02-10 DIAGNOSIS — D649 Anemia, unspecified: Secondary | ICD-10-CM

## 2020-02-10 DIAGNOSIS — O0990 Supervision of high risk pregnancy, unspecified, unspecified trimester: Secondary | ICD-10-CM

## 2020-02-10 LAB — POCT URINALYSIS DIPSTICK OB
Glucose, UA: NEGATIVE
POC,PROTEIN,UA: NEGATIVE

## 2020-02-10 MED ORDER — ACETAMINOPHEN-CODEINE #3 300-30 MG PO TABS: 1.0000 | ORAL_TABLET | ORAL | 0 refills | Status: DC | PRN
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
## 2020-02-10 NOTE — Patient Instructions (Signed)
Third Trimester of Pregnancy The third trimester is from week 28 through week 40 (months 7 through 9). The third trimester is a time when the unborn baby (fetus) is growing rapidly. At the end of the ninth month, the fetus is about 20 inches in length and weighs 6-10 pounds. Body changes during your third trimester Your body will continue to go through many changes during pregnancy. The changes vary from woman to woman. During the third trimester:  Your weight will continue to increase. You can expect to gain 25-35 pounds (11-16 kg) by the end of the pregnancy.  You may begin to get stretch marks on your hips, abdomen, and breasts.  You may urinate more often because the fetus is moving lower into your pelvis and pressing on your bladder.  You may develop or continue to have heartburn. This is caused by increased hormones that slow down muscles in the digestive tract.  You may develop or continue to have constipation because increased hormones slow digestion and cause the muscles that push waste through your intestines to relax.  You may develop hemorrhoids. These are swollen veins (varicose veins) in the rectum that can itch or be painful.  You may develop swollen, bulging veins (varicose veins) in your legs.  You may have increased body aches in the pelvis, back, or thighs. This is due to weight gain and increased hormones that are relaxing your joints.  You may have changes in your hair. These can include thickening of your hair, rapid growth, and changes in texture. Some women also have hair loss during or after pregnancy, or hair that feels dry or thin. Your hair will most likely return to normal after your baby is born.  Your breasts will continue to grow and they will continue to become tender. A yellow fluid (colostrum) may leak from your breasts. This is the first milk you are producing for your baby.  Your belly button may stick out.  You may notice more swelling in your hands,  face, or ankles.  You may have increased tingling or numbness in your hands, arms, and legs. The skin on your belly may also feel numb.  You may feel short of breath because of your expanding uterus.  You may have more problems sleeping. This can be caused by the size of your belly, increased need to urinate, and an increase in your body's metabolism.  You may notice the fetus "dropping," or moving lower in your abdomen (lightening).  You may have increased vaginal discharge.  You may notice your joints feel loose and you may have pain around your pelvic bone. What to expect at prenatal visits You will have prenatal exams every 2 weeks until week 36. Then you will have weekly prenatal exams. During a routine prenatal visit:  You will be weighed to make sure you and the baby are growing normally.  Your blood pressure will be taken.  Your abdomen will be measured to track your baby's growth.  The fetal heartbeat will be listened to.  Any test results from the previous visit will be discussed.  You may have a cervical check near your due date to see if your cervix has softened or thinned (effaced).  You will be tested for Group B streptococcus. This happens between 35 and 37 weeks. Your health care provider may ask you:  What your birth plan is.  How you are feeling.  If you are feeling the baby move.  If you have had any abnormal   symptoms, such as leaking fluid, bleeding, severe headaches, or abdominal cramping.  If you are using any tobacco products, including cigarettes, chewing tobacco, and electronic cigarettes.  If you have any questions. Other tests or screenings that may be performed during your third trimester include:  Blood tests that check for low iron levels (anemia).  Fetal testing to check the health, activity level, and growth of the fetus. Testing is done if you have certain medical conditions or if there are problems during the pregnancy.  Nonstress test  (NST). This test checks the health of your baby to make sure there are no signs of problems, such as the baby not getting enough oxygen. During this test, a belt is placed around your belly. The baby is made to move, and its heart rate is monitored during movement. What is false labor? False labor is a condition in which you feel small, irregular tightenings of the muscles in the womb (contractions) that usually go away with rest, changing position, or drinking water. These are called Braxton Hicks contractions. Contractions may last for hours, days, or even weeks before true labor sets in. If contractions come at regular intervals, become more frequent, increase in intensity, or become painful, you should see your health care provider. What are the signs of labor?  Abdominal cramps.  Regular contractions that start at 10 minutes apart and become stronger and more frequent with time.  Contractions that start on the top of the uterus and spread down to the lower abdomen and back.  Increased pelvic pressure and dull back pain.  A watery or bloody mucus discharge that comes from the vagina.  Leaking of amniotic fluid. This is also known as your "water breaking." It could be a slow trickle or a gush. Let your health care provider know if it has a color or strange odor. If you have any of these signs, call your health care provider right away, even if it is before your due date. Follow these instructions at home: Medicines  Follow your health care provider's instructions regarding medicine use. Specific medicines may be either safe or unsafe to take during pregnancy.  Take a prenatal vitamin that contains at least 600 micrograms (mcg) of folic acid.  If you develop constipation, try taking a stool softener if your health care provider approves. Eating and drinking   Eat a balanced diet that includes fresh fruits and vegetables, whole grains, good sources of protein such as meat, eggs, or tofu,  and low-fat dairy. Your health care provider will help you determine the amount of weight gain that is right for you.  Avoid raw meat and uncooked cheese. These carry germs that can cause birth defects in the baby.  If you have low calcium intake from food, talk to your health care provider about whether you should take a daily calcium supplement.  Eat four or five small meals rather than three large meals a day.  Limit foods that are high in fat and processed sugars, such as fried and sweet foods.  To prevent constipation: ? Drink enough fluid to keep your urine clear or pale yellow. ? Eat foods that are high in fiber, such as fresh fruits and vegetables, whole grains, and beans. Activity  Exercise only as directed by your health care provider. Most women can continue their usual exercise routine during pregnancy. Try to exercise for 30 minutes at least 5 days a week. Stop exercising if you experience uterine contractions.  Avoid heavy lifting.  Do   not exercise in extreme heat or humidity, or at high altitudes.  Wear low-heel, comfortable shoes.  Practice good posture.  You may continue to have sex unless your health care provider tells you otherwise. Relieving pain and discomfort  Take frequent breaks and rest with your legs elevated if you have leg cramps or low back pain.  Take warm sitz baths to soothe any pain or discomfort caused by hemorrhoids. Use hemorrhoid cream if your health care provider approves.  Wear a good support bra to prevent discomfort from breast tenderness.  If you develop varicose veins: ? Wear support pantyhose or compression stockings as told by your healthcare provider. ? Elevate your feet for 15 minutes, 3-4 times a day. Prenatal care  Write down your questions. Take them to your prenatal visits.  Keep all your prenatal visits as told by your health care provider. This is important. Safety  Wear your seat belt at all times when driving.  Make  a list of emergency phone numbers, including numbers for family, friends, the hospital, and police and fire departments. General instructions  Avoid cat litter boxes and soil used by cats. These carry germs that can cause birth defects in the baby. If you have a cat, ask someone to clean the litter box for you.  Do not travel far distances unless it is absolutely necessary and only with the approval of your health care provider.  Do not use hot tubs, steam rooms, or saunas.  Do not drink alcohol.  Do not use any products that contain nicotine or tobacco, such as cigarettes and e-cigarettes. If you need help quitting, ask your health care provider.  Do not use any medicinal herbs or unprescribed drugs. These chemicals affect the formation and growth of the baby.  Do not douche or use tampons or scented sanitary pads.  Do not cross your legs for long periods of time.  To prepare for the arrival of your baby: ? Take prenatal classes to understand, practice, and ask questions about labor and delivery. ? Make a trial run to the hospital. ? Visit the hospital and tour the maternity area. ? Arrange for maternity or paternity leave through employers. ? Arrange for family and friends to take care of pets while you are in the hospital. ? Purchase a rear-facing car seat and make sure you know how to install it in your car. ? Pack your hospital bag. ? Prepare the baby's nursery. Make sure to remove all pillows and stuffed animals from the baby's crib to prevent suffocation.  Visit your dentist if you have not gone during your pregnancy. Use a soft toothbrush to brush your teeth and be gentle when you floss. Contact a health care provider if:  You are unsure if you are in labor or if your water has broken.  You become dizzy.  You have mild pelvic cramps, pelvic pressure, or nagging pain in your abdominal area.  You have lower back pain.  You have persistent nausea, vomiting, or  diarrhea.  You have an unusual or bad smelling vaginal discharge.  You have pain when you urinate. Get help right away if:  Your water breaks before 37 weeks.  You have regular contractions less than 5 minutes apart before 37 weeks.  You have a fever.  You are leaking fluid from your vagina.  You have spotting or bleeding from your vagina.  You have severe abdominal pain or cramping.  You have rapid weight loss or weight gain.  You have   shortness of breath with chest pain.  You notice sudden or extreme swelling of your face, hands, ankles, feet, or legs.  Your baby makes fewer than 10 movements in 2 hours.  You have severe headaches that do not go away when you take medicine.  You have vision changes. Summary  The third trimester is from week 28 through week 40, months 7 through 9. The third trimester is a time when the unborn baby (fetus) is growing rapidly.  During the third trimester, your discomfort may increase as you and your baby continue to gain weight. You may have abdominal, leg, and back pain, sleeping problems, and an increased need to urinate.  During the third trimester your breasts will keep growing and they will continue to become tender. A yellow fluid (colostrum) may leak from your breasts. This is the first milk you are producing for your baby.  False labor is a condition in which you feel small, irregular tightenings of the muscles in the womb (contractions) that eventually go away. These are called Braxton Hicks contractions. Contractions may last for hours, days, or even weeks before true labor sets in.  Signs of labor can include: abdominal cramps; regular contractions that start at 10 minutes apart and become stronger and more frequent with time; watery or bloody mucus discharge that comes from the vagina; increased pelvic pressure and dull back pain; and leaking of amniotic fluid. This information is not intended to replace advice given to you by your  health care provider. Make sure you discuss any questions you have with your health care provider. Document Revised: 11/12/2018 Document Reviewed: 08/27/2016 Elsevier Patient Education  2020 [employer]  

## 2020-02-11 ENCOUNTER — Telehealth: Payer: Self-pay

## 2020-02-11 LAB — FERRITIN: Ferritin: 12 ng/mL — ABNORMAL LOW (ref 15–150)

## 2020-02-11 LAB — IRON AND TIBC
Iron Saturation: 14 % — ABNORMAL LOW (ref 15–55)
Iron: 59 ug/dL (ref 27–159)
Total Iron Binding Capacity: 408 ug/dL (ref 250–450)
UIBC: 349 ug/dL (ref 131–425)

## 2020-02-11 LAB — VITAMIN B12: Vitamin B-12: 378 pg/mL (ref 232–1245)

## 2020-02-11 NOTE — Telephone Encounter (Signed)
PA request received from [facility] regarding Acetaminophen-Codeine 300-350mg .  I looked in pt's chart for a dx code. There is not one in your notes. Please advise.

## 2020-02-17 ENCOUNTER — Encounter: Payer: Self-pay | Admitting: Obstetrics and Gynecology

## 2020-02-17 DIAGNOSIS — O26893 Other specified pregnancy related conditions, third trimester: Secondary | ICD-10-CM | POA: Insufficient documentation

## 2020-02-17 DIAGNOSIS — M25559 Pain in unspecified hip: Secondary | ICD-10-CM | POA: Insufficient documentation

## 2020-02-17 NOTE — Telephone Encounter (Signed)
Dx code is hip pain in pregnancy. I added it to her problem list.  [phone]  Thank you,  Dr. Jerene Pitch

## 2020-02-18 ENCOUNTER — Ambulatory Visit (INDEPENDENT_AMBULATORY_CARE_PROVIDER_SITE_OTHER): Payer: Medicaid Other | Admitting: Orthopaedic Surgery

## 2020-02-18 ENCOUNTER — Ambulatory Visit (INDEPENDENT_AMBULATORY_CARE_PROVIDER_SITE_OTHER): Payer: Medicaid Other

## 2020-02-18 ENCOUNTER — Other Ambulatory Visit: Payer: Self-pay | Admitting: Obstetrics and Gynecology

## 2020-02-18 ENCOUNTER — Encounter: Payer: Self-pay | Admitting: Orthopaedic Surgery

## 2020-02-18 VITALS — Ht 65.5 in | Wt 202.8 lb

## 2020-02-18 DIAGNOSIS — M25552 Pain in left hip: Secondary | ICD-10-CM

## 2020-02-18 DIAGNOSIS — O99013 Anemia complicating pregnancy, third trimester: Secondary | ICD-10-CM
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
## 2020-02-18 NOTE — Telephone Encounter (Signed)
Referral placed.

## 2020-02-21 ENCOUNTER — Encounter: Payer: Self-pay | Admitting: Obstetrics and Gynecology

## 2020-02-21 ENCOUNTER — Ambulatory Visit (INDEPENDENT_AMBULATORY_CARE_PROVIDER_SITE_OTHER): Payer: Medicaid Other

## 2020-02-21 ENCOUNTER — Other Ambulatory Visit: Payer: Self-pay

## 2020-02-21 ENCOUNTER — Ambulatory Visit (INDEPENDENT_AMBULATORY_CARE_PROVIDER_SITE_OTHER): Payer: Medicaid Other | Admitting: Obstetrics and Gynecology

## 2020-02-21 VITALS — BP 120/72 | Ht 65.0 in | Wt 205.0 lb

## 2020-02-21 DIAGNOSIS — O26893 Other specified pregnancy related conditions, third trimester: Secondary | ICD-10-CM

## 2020-02-21 DIAGNOSIS — Z3A34 34 weeks gestation of pregnancy: Secondary | ICD-10-CM

## 2020-02-21 DIAGNOSIS — O0993 Supervision of high risk pregnancy, unspecified, third trimester: Secondary | ICD-10-CM

## 2020-02-21 DIAGNOSIS — O99213 Obesity complicating pregnancy, third trimester: Secondary | ICD-10-CM

## 2020-02-21 DIAGNOSIS — M25559 Pain in unspecified hip: Secondary | ICD-10-CM

## 2020-02-21 DIAGNOSIS — O99013 Anemia complicating pregnancy, third trimester: Secondary | ICD-10-CM

## 2020-02-21 LAB — POCT URINALYSIS DIPSTICK OB
Glucose, UA: NEGATIVE
POC,PROTEIN,UA: NEGATIVE

## 2020-02-21 NOTE — Patient Instructions (Signed)
Third Trimester of Pregnancy The third trimester is from week 28 through week 40 (months 7 through 9). The third trimester is a time when the unborn baby (fetus) is growing rapidly. At the end of the ninth month, the fetus is about 20 inches in length and weighs 6-10 pounds. Body changes during your third trimester Your body will continue to go through many changes during pregnancy. The changes vary from woman to woman. During the third trimester:  Your weight will continue to increase. You can expect to gain 25-35 pounds (11-16 kg) by the end of the pregnancy.  You may begin to get stretch marks on your hips, abdomen, and breasts.  You may urinate more often because the fetus is moving lower into your pelvis and pressing on your bladder.  You may develop or continue to have heartburn. This is caused by increased hormones that slow down muscles in the digestive tract.  You may develop or continue to have constipation because increased hormones slow digestion and cause the muscles that push waste through your intestines to relax.  You may develop hemorrhoids. These are swollen veins (varicose veins) in the rectum that can itch or be painful.  You may develop swollen, bulging veins (varicose veins) in your legs.  You may have increased body aches in the pelvis, back, or thighs. This is due to weight gain and increased hormones that are relaxing your joints.  You may have changes in your hair. These can include thickening of your hair, rapid growth, and changes in texture. Some women also have hair loss during or after pregnancy, or hair that feels dry or thin. Your hair will most likely return to normal after your baby is born.  Your breasts will continue to grow and they will continue to become tender. A yellow fluid (colostrum) may leak from your breasts. This is the first milk you are producing for your baby.  Your belly button may stick out.  You may notice more swelling in your hands,  face, or ankles.  You may have increased tingling or numbness in your hands, arms, and legs. The skin on your belly may also feel numb.  You may feel short of breath because of your expanding uterus.  You may have more problems sleeping. This can be caused by the size of your belly, increased need to urinate, and an increase in your body's metabolism.  You may notice the fetus "dropping," or moving lower in your abdomen (lightening).  You may have increased vaginal discharge.  You may notice your joints feel loose and you may have pain around your pelvic bone. What to expect at prenatal visits You will have prenatal exams every 2 weeks until week 36. Then you will have weekly prenatal exams. During a routine prenatal visit:  You will be weighed to make sure you and the baby are growing normally.  Your blood pressure will be taken.  Your abdomen will be measured to track your baby's growth.  The fetal heartbeat will be listened to.  Any test results from the previous visit will be discussed.  You may have a cervical check near your due date to see if your cervix has softened or thinned (effaced).  You will be tested for Group B streptococcus. This happens between 35 and 37 weeks. Your health care provider may ask you:  What your birth plan is.  How you are feeling.  If you are feeling the baby move.  If you have had any abnormal   symptoms, such as leaking fluid, bleeding, severe headaches, or abdominal cramping.  If you are using any tobacco products, including cigarettes, chewing tobacco, and electronic cigarettes.  If you have any questions. Other tests or screenings that may be performed during your third trimester include:  Blood tests that check for low iron levels (anemia).  Fetal testing to check the health, activity level, and growth of the fetus. Testing is done if you have certain medical conditions or if there are problems during the pregnancy.  Nonstress test  (NST). This test checks the health of your baby to make sure there are no signs of problems, such as the baby not getting enough oxygen. During this test, a belt is placed around your belly. The baby is made to move, and its heart rate is monitored during movement. What is false labor? False labor is a condition in which you feel small, irregular tightenings of the muscles in the womb (contractions) that usually go away with rest, changing position, or drinking water. These are called Braxton Hicks contractions. Contractions may last for hours, days, or even weeks before true labor sets in. If contractions come at regular intervals, become more frequent, increase in intensity, or become painful, you should see your health care provider. What are the signs of labor?  Abdominal cramps.  Regular contractions that start at 10 minutes apart and become stronger and more frequent with time.  Contractions that start on the top of the uterus and spread down to the lower abdomen and back.  Increased pelvic pressure and dull back pain.  A watery or bloody mucus discharge that comes from the vagina.  Leaking of amniotic fluid. This is also known as your "water breaking." It could be a slow trickle or a gush. Let your health care provider know if it has a color or strange odor. If you have any of these signs, call your health care provider right away, even if it is before your due date. Follow these instructions at home: Medicines  Follow your health care provider's instructions regarding medicine use. Specific medicines may be either safe or unsafe to take during pregnancy.  Take a prenatal vitamin that contains at least 600 micrograms (mcg) of folic acid.  If you develop constipation, try taking a stool softener if your health care provider approves. Eating and drinking   Eat a balanced diet that includes fresh fruits and vegetables, whole grains, good sources of protein such as meat, eggs, or tofu,  and low-fat dairy. Your health care provider will help you determine the amount of weight gain that is right for you.  Avoid raw meat and uncooked cheese. These carry germs that can cause birth defects in the baby.  If you have low calcium intake from food, talk to your health care provider about whether you should take a daily calcium supplement.  Eat four or five small meals rather than three large meals a day.  Limit foods that are high in fat and processed sugars, such as fried and sweet foods.  To prevent constipation: ? Drink enough fluid to keep your urine clear or pale yellow. ? Eat foods that are high in fiber, such as fresh fruits and vegetables, whole grains, and beans. Activity  Exercise only as directed by your health care provider. Most women can continue their usual exercise routine during pregnancy. Try to exercise for 30 minutes at least 5 days a week. Stop exercising if you experience uterine contractions.  Avoid heavy lifting.  Do   not exercise in extreme heat or humidity, or at high altitudes.  Wear low-heel, comfortable shoes.  Practice good posture.  You may continue to have sex unless your health care provider tells you otherwise. Relieving pain and discomfort  Take frequent breaks and rest with your legs elevated if you have leg cramps or low back pain.  Take warm sitz baths to soothe any pain or discomfort caused by hemorrhoids. Use hemorrhoid cream if your health care provider approves.  Wear a good support bra to prevent discomfort from breast tenderness.  If you develop varicose veins: ? Wear support pantyhose or compression stockings as told by your healthcare provider. ? Elevate your feet for 15 minutes, 3-4 times a day. Prenatal care  Write down your questions. Take them to your prenatal visits.  Keep all your prenatal visits as told by your health care provider. This is important. Safety  Wear your seat belt at all times when driving.  Make  a list of emergency phone numbers, including numbers for family, friends, the hospital, and police and fire departments. General instructions  Avoid cat litter boxes and soil used by cats. These carry germs that can cause birth defects in the baby. If you have a cat, ask someone to clean the litter box for you.  Do not travel far distances unless it is absolutely necessary and only with the approval of your health care provider.  Do not use hot tubs, steam rooms, or saunas.  Do not drink alcohol.  Do not use any products that contain nicotine or tobacco, such as cigarettes and e-cigarettes. If you need help quitting, ask your health care provider.  Do not use any medicinal herbs or unprescribed drugs. These chemicals affect the formation and growth of the baby.  Do not douche or use tampons or scented sanitary pads.  Do not cross your legs for long periods of time.  To prepare for the arrival of your baby: ? Take prenatal classes to understand, practice, and ask questions about labor and delivery. ? Make a trial run to the hospital. ? Visit the hospital and tour the maternity area. ? Arrange for maternity or paternity leave through employers. ? Arrange for family and friends to take care of pets while you are in the hospital. ? Purchase a rear-facing car seat and make sure you know how to install it in your car. ? Pack your hospital bag. ? Prepare the baby's nursery. Make sure to remove all pillows and stuffed animals from the baby's crib to prevent suffocation.  Visit your dentist if you have not gone during your pregnancy. Use a soft toothbrush to brush your teeth and be gentle when you floss. Contact a health care provider if:  You are unsure if you are in labor or if your water has broken.  You become dizzy.  You have mild pelvic cramps, pelvic pressure, or nagging pain in your abdominal area.  You have lower back pain.  You have persistent nausea, vomiting, or  diarrhea.  You have an unusual or bad smelling vaginal discharge.  You have pain when you urinate. Get help right away if:  Your water breaks before 37 weeks.  You have regular contractions less than 5 minutes apart before 37 weeks.  You have a fever.  You are leaking fluid from your vagina.  You have spotting or bleeding from your vagina.  You have severe abdominal pain or cramping.  You have rapid weight loss or weight gain.  You have   shortness of breath with chest pain.  You notice sudden or extreme swelling of your face, hands, ankles, feet, or legs.  Your baby makes fewer than 10 movements in 2 hours.  You have severe headaches that do not go away when you take medicine.  You have vision changes. Summary  The third trimester is from week 28 through week 40, months 7 through 9. The third trimester is a time when the unborn baby (fetus) is growing rapidly.  During the third trimester, your discomfort may increase as you and your baby continue to gain weight. You may have abdominal, leg, and back pain, sleeping problems, and an increased need to urinate.  During the third trimester your breasts will keep growing and they will continue to become tender. A yellow fluid (colostrum) may leak from your breasts. This is the first milk you are producing for your baby.  False labor is a condition in which you feel small, irregular tightenings of the muscles in the womb (contractions) that eventually go away. These are called Braxton Hicks contractions. Contractions may last for hours, days, or even weeks before true labor sets in.  Signs of labor can include: abdominal cramps; regular contractions that start at 10 minutes apart and become stronger and more frequent with time; watery or bloody mucus discharge that comes from the vagina; increased pelvic pressure and dull back pain; and leaking of amniotic fluid. This information is not intended to replace advice given to you by your  health care provider. Make sure you discuss any questions you have with your health care provider. Document Revised: 11/12/2018 Document Reviewed: 08/27/2016 Elsevier Patient Education  2020 [employer]  
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
## 2020-02-23 ENCOUNTER — Ambulatory Visit: Payer: Medicaid Other | Attending: Obstetrics and Gynecology

## 2020-02-23 ENCOUNTER — Other Ambulatory Visit: Payer: Self-pay

## 2020-02-23 DIAGNOSIS — R293 Abnormal posture: Secondary | ICD-10-CM | POA: Diagnosis not present

## 2020-02-23 DIAGNOSIS — M533 Sacrococcygeal disorders, not elsewhere classified: Secondary | ICD-10-CM | POA: Diagnosis not present

## 2020-02-23 DIAGNOSIS — M62838 Other muscle spasm: Secondary | ICD-10-CM | POA: Diagnosis not present
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
## 2020-02-23 NOTE — Patient Instructions (Signed)
° °  Hold for 30 seconds (5 deep breaths) and repeat 2-3 times on each side once a day    Tuck the pelvis under slightly and feel the stretch on the front of the back leg. Hold for 5 deep breaths and repeat 2-3 times on each side.    Do 2 sets of 15 tilts per day. Breathe in when you tilt forward (A) and out when you tuck under (B).    Sit, feet flat, scoot forward to the edge of the chair. Inhale as you bend forward at hips, begin to exhale just before and while you stand, contracting the glutes, lower tummy muscles and pelvic floor as if stopping urination as you stand up.   * Do this every time you sit or stand! If you catch yourself doing it "wrong, re-set and do it again so it can become habit!    Getting In/out of bed     Lying on back, bend left knee and place left arm across chest. Roll all in one movement to the right. Reverse to roll to the left. Always move as one unit.     Once you are lying on you side, move legs to edge of bed. Pull in the pelvic floor and lower tummy and push down with both hands while moving legs off bed to reach sitting position.  *Reverse sequence to return to lying down.  Copyright  VHI. All rights reserved.   EXHALE ON EXERTION!!!!!!!!!!!!!!!

## 2020-02-24 ENCOUNTER — Inpatient Hospital Stay: Payer: Medicaid Other

## 2020-02-24 ENCOUNTER — Encounter: Payer: Self-pay | Admitting: Oncology

## 2020-02-24 ENCOUNTER — Inpatient Hospital Stay: Payer: Medicaid Other | Attending: Oncology | Admitting: Oncology

## 2020-02-24 VITALS — BP 102/68 | HR 93 | Temp 96.3°F | Resp 18 | Ht 65.0 in | Wt 202.3 lb

## 2020-02-24 DIAGNOSIS — Z3493 Encounter for supervision of normal pregnancy, unspecified, third trimester: Secondary | ICD-10-CM

## 2020-02-24 DIAGNOSIS — R0609 Other forms of dyspnea: Secondary | ICD-10-CM | POA: Diagnosis not present

## 2020-02-24 DIAGNOSIS — D508 Other iron deficiency anemias: Secondary | ICD-10-CM

## 2020-02-24 DIAGNOSIS — O99013 Anemia complicating pregnancy, third trimester: Secondary | ICD-10-CM | POA: Insufficient documentation

## 2020-02-24 DIAGNOSIS — D649 Anemia, unspecified: Secondary | ICD-10-CM | POA: Insufficient documentation

## 2020-02-24 DIAGNOSIS — R5383 Other fatigue: Secondary | ICD-10-CM | POA: Insufficient documentation

## 2020-02-24 LAB — CBC WITH DIFFERENTIAL/PLATELET
Abs Immature Granulocytes: 0.03 10*3/uL (ref 0.00–0.07)
Basophils Absolute: 0 10*3/uL (ref 0.0–0.1)
Basophils Relative: 0 %
Eosinophils Absolute: 0 10*3/uL (ref 0.0–0.5)
Eosinophils Relative: 0 %
HCT: 31.9 % — ABNORMAL LOW (ref 36.0–46.0)
Hemoglobin: 11 g/dL — ABNORMAL LOW (ref 12.0–15.0)
Immature Granulocytes: 0 %
Lymphocytes Relative: 18 %
Lymphs Abs: 1.3 10*3/uL (ref 0.7–4.0)
MCH: 28.5 pg (ref 26.0–34.0)
MCHC: 34.5 g/dL (ref 30.0–36.0)
MCV: 82.6 fL (ref 80.0–100.0)
Monocytes Absolute: 0.5 10*3/uL (ref 0.1–1.0)
Monocytes Relative: 7 %
Neutro Abs: 5.3 10*3/uL (ref 1.7–7.7)
Neutrophils Relative %: 75 %
Platelets: 214 10*3/uL (ref 150–400)
RBC: 3.86 MIL/uL — ABNORMAL LOW (ref 3.87–5.11)
RDW: 14.3 % (ref 11.5–15.5)
WBC: 7.1 10*3/uL (ref 4.0–10.5)
nRBC: 0 % (ref 0.0–0.2)

## 2020-02-24 LAB — RETIC PANEL
Immature Retic Fract: 22.5 % — ABNORMAL HIGH (ref 2.3–15.9)
RBC.: 3.87 MIL/uL (ref 3.87–5.11)
Retic Count, Absolute: 100.2 10*3/uL (ref 19.0–186.0)
Retic Ct Pct: 2.6 % (ref 0.4–3.1)
Reticulocyte Hemoglobin: 29.2 pg (ref >27.9)

## 2020-02-24 LAB — TECHNOLOGIST SMEAR REVIEW: Plt Morphology: ADEQUATE

## 2020-02-24 NOTE — Progress Notes (Signed)
Patient here to establish care for anemia in pregnancy. Pt is [redacted] weeks pregnant.

## 2020-02-24 NOTE — Progress Notes (Signed)
Hematology/Oncology Consult note [facility] [phone] [phone]   Patient Care Team: [facility], [place] as PCP - General  REFERRING PROVIDER: [name], * CHIEF COMPLAINTS/REASON FOR VISIT:  Evaluation of iron deficiency anemia in pregnancy  HISTORY OF PRESENTING ILLNESS:  Anna Flores is a  29 y.o.  female with PMH listed below was seen in consultation at the request of Jerene Pitch, Christanna R, *   for evaluation of iron deficiency anemia.   Reviewed patient's recent labs  01/23/2020 labs revealed anemia with hemoglobin of 11.1 Reviewed patient's previous labs ordered by primary care physician's office, anemia is intermittent, since 2017. Marland Kitchen   Patient is currently pregnant with 5th child, third trimester.  Associated signs and symptoms: Patient reports fatigue.  SOB with exertion.  Denies weight loss, easy bruising, hematochezia, hemoptysis, hematuria. Context:  History of iron deficiency: Patient has tried oral iron supplementation and not able to tolerate due to nausea and vomiting.  Rectal bleeding: Denies Menstrual bleeding/ Vaginal bleeding : before pregnancy, she has heavy menstrual period Hematemesis or hemoptysis : denies Blood in urine : denies      Review of Systems  Constitutional: Positive for fatigue. Negative for appetite change, chills and fever.  HENT:   Negative for hearing loss and voice change.   Eyes: Negative for eye problems.  Respiratory: Negative for chest tightness and cough.   Cardiovascular: Negative for chest pain.  Gastrointestinal: Negative for abdominal distention, abdominal pain and blood in stool.  Endocrine: Negative for hot flashes.  Genitourinary: Negative for difficulty urinating and frequency.   Musculoskeletal: Negative for arthralgias.  Skin: Negative for itching and rash.  Neurological: Negative for extremity weakness.  Hematological: Negative for adenopathy.    Psychiatric/Behavioral: Negative for confusion.    MEDICAL HISTORY:  Past Medical History:  Diagnosis Date  . Abdominal pain 10/26/2013  . Anemia 2013  . Anxiety   . Depression   . History of Papanicolaou smear of cervix 10/02/2016   NEG  . Menometrorrhagia 10/26/2013  . Pregnancy    DELIVERED 08/12/16 - JEG    SURGICAL HISTORY: Past Surgical History:  Procedure Laterality Date  . DIAGNOSTIC LAPAROSCOPY  2018   suspected ectopic pregnancy-[facility]  . TONSILLECTOMY      SOCIAL HISTORY: Social History   Socioeconomic History  . Marital status: Married    Spouse name: [name]   . Number of children: 3  . Years of education: 3  . Highest education level: Not on file  Occupational History  . Occupation: HOMEMAKER  Tobacco Use  . Smoking status: Never Smoker  . Smokeless tobacco: Never Used  Vaping Use  . Vaping Use: Never used  Substance and Sexual Activity  . Alcohol use: No  . Drug use: No  . Sexual activity: Yes    Birth control/protection: Surgical    Comment: Vasectomy or BTL  Other Topics Concern  . Not on file  Social History Narrative  . Not on file   Social Determinants of Health   Financial Resource Strain:   . Difficulty of Paying Living Expenses:   Food Insecurity:   . Worried About Programme researcher, broadcasting/film/video in the Last Year:   . Barista in the Last Year:   Transportation Needs:   . Freight forwarder (Medical):   Marland Kitchen Lack of Transportation (Non-Medical):   Physical Activity:   . Days of Exercise per Week:   . Minutes of Exercise per Session:   Stress:   .  Feeling of Stress :   Social Connections:   . Frequency of Communication with Friends and Family:   . Frequency of Social Gatherings with Friends and Family:   . Attends Religious Services:   . Active Member of Clubs or Organizations:   . Attends Banker Meetings:   Marland Kitchen Marital Status:   Intimate Partner Violence:   . Fear of Current or Ex-Partner:   . Emotionally Abused:    Marland Kitchen Physically Abused:   . Sexually Abused:     FAMILY HISTORY: Family History  Adopted: Yes  Problem Relation Age of Onset  . Nevi Sister        DISPLASTIC NEVUS  . Ovarian cancer Mother   . Cirrhosis Father     ALLERGIES:  is allergic to amoxicillin, hydrocodone, reglan [metoclopramide], and cherry.  MEDICATIONS:  Current Outpatient Medications  Medication Sig Dispense Refill  . acetaminophen-codeine (TYLENOL #3) 300-30 MG tablet Take 1 tablet by mouth every 4 (four) hours as needed for moderate pain. 30 tablet 0   No current facility-administered medications for this visit.     PHYSICAL EXAMINATION: ECOG PERFORMANCE STATUS: 1 - Symptomatic but completely ambulatory Vitals:   02/24/20 0940  BP: 102/68  Pulse: 93  Resp: 18  Temp: (!) 96.3 F (35.7 C)   Filed Weights   02/24/20 0940  Weight: 202 lb 4.8 oz (91.8 kg)    Physical Exam Constitutional:      General: She is not in acute distress. HENT:     Head: Normocephalic and atraumatic.  Eyes:     General: No scleral icterus. Cardiovascular:     Rate and Rhythm: Normal rate and regular rhythm.     Heart sounds: Normal heart sounds.  Pulmonary:     Effort: Pulmonary effort is normal. No respiratory distress.     Breath sounds: No wheezing.  Abdominal:     General: Bowel sounds are normal. There is no distension.     Palpations: Abdomen is soft.     Comments: Gravid uterous  Musculoskeletal:        General: No deformity. Normal range of motion.     Cervical back: Normal range of motion and neck supple.  Skin:    General: Skin is warm and dry.     Findings: No erythema or rash.  Neurological:     Mental Status: She is alert and oriented to person, place, and time. Mental status is at baseline.     Cranial Nerves: No cranial nerve deficit.     Coordination: Coordination normal.  Psychiatric:        Mood and Affect: Mood normal.       CMP Latest Ref Rng & Units 01/23/2020  Glucose 70 - 99 mg/dL 86   BUN 6 - 20 mg/dL <1(X)  Creatinine 9.14 - 1.00 mg/dL 7.82(N)  Sodium 562 - 130 mmol/L 137  Potassium 3.5 - 5.1 mmol/L 3.6  Chloride 98 - 111 mmol/L 106  CO2 22 - 32 mmol/L 23  Calcium 8.9 - 10.3 mg/dL 8.6(V)  Total Protein 6.5 - 8.1 g/dL 6.5  Total Bilirubin 0.3 - 1.2 mg/dL 0.8  Alkaline Phos 38 - 126 U/L 71  AST 15 - 41 U/L 15  ALT 0 - 44 U/L 13   CBC Latest Ref Rng & Units 01/23/2020  WBC 4.0 - 10.5 K/uL 8.3  Hemoglobin 12.0 - 15.0 g/dL 11.1(L)  Hematocrit 36 - 46 % 31.5(L)  Platelets 150 - 400 K/uL 225  LABORATORY DATA:  I have reviewed the data as listed Lab Results  Component Value Date   WBC 8.3 01/23/2020   HGB 11.1 (L) 01/23/2020   HCT 31.5 (L) 01/23/2020   MCV 83.8 01/23/2020   PLT 225 01/23/2020   Recent Labs    08/11/19 1225 09/28/19 0926 01/23/20 1345  NA 136 136 137  K 3.4* 3.4* 3.6  CL 103 105 106  CO2 27 23 23   GLUCOSE 110* 99 86  BUN 6 <5* <5*  CREATININE 0.65 0.44 0.42*  CALCIUM 9.4 9.2 8.8*  GFRNONAA >60 >60 >60  GFRAA >60 >60 >60  PROT 7.5 6.9 6.5  ALBUMIN 4.4 3.6 3.2*  AST 17 14* 15  ALT 15 14 13   ALKPHOS 58 46 71  BILITOT 1.2 0.9 0.8   Iron/TIBC/Ferritin/ %Sat    Component Value Date/Time   IRON 59 02/10/2020 1139   TIBC 408 02/10/2020 1139   FERRITIN 12 (L) 02/10/2020 1139   IRONPCTSAT 14 (L) 02/10/2020 1139     RADIOGRAPHIC STUDIES: I have personally reviewed the radiological images as listed and agreed with the findings in the report. US OB Follow Up  Result Date: 02/21/2020 Patient Name: Anna Lesserngel Lewman DOB: [birth year] MRN: [id number] ULTRASOUND REPORT Location: [facility] Date of Service: 02/21/2020 Indications:growth/afi Findings: Mason JimSingleton intrauterine pregnancy is visualized with FHR at 149 BPM. Biometrics give an (U/S) Gestational age of 1613w5d and an (U/S) EDD of 04/05/2020; this correlates with the clinically established Estimated Date of Delivery: 04/02/20. Fetal presentation is Cephalic. Placenta: posterior. Grade: 1  AFI: 8.4 cm Growth percentile: 40.1%.  AC percentile is 79.1%. EFW: 2298 g  ( 5 lb 1 oz) Impression: 1. 432w1d Viable Singleton Intrauterine pregnancy previously established criteria. 2. Growth is 40.1 %ile.  AFI is 8.4 cm. Recommendations: 1.Clinical correlation with the patient's History and Physical Exam. Deanna ArtisElyse S Fairbanks, RT I have reviewed this ultrasound and the report. I agree with the above assessment and plan. Adelene Idlerhristanna Schuman MD [facility] 02/21/20 5:23 PM    XR HIP UNILAT W OR W/O PELVIS 2-3 VIEWS LEFT  Result Date: 02/18/2020 X-rays reveal pregnancy related changes to the pubic symphysis.  No other acute findings.      ASSESSMENT & PLAN:  1. Other iron deficiency anemia   2. Third trimester pregnancy    Labs are reviewed and discussed with patient. Consistent with iron deficiency anemia. Last cbc is from June. Recommend repeat cbc, smear and retic panel.  I discussed about option of continue oral iron supplementation for a few weeks and repeat blood work for evaluation of treatment response.  If no significant improvement, then to do IV Venofer treatments. Other option of proceed with IV Venofer treatments now to ensure that she will have improved iron stores/anemia by the time of delivery. Allergy reactions/infusion reaction including anaphylactic reaction discussed with patient. Other side effects include but not limited to high blood pressure, skin rash, weight gain, leg swelling,flu like symptoms, fever, diarrhea etc. Venofer is [place] FDA pregnancy category B, animal reproduction studies have failed to demonstrate a risk to the fetus and there are no adequate and well-controlled studies in pregnant women. She voices understanding the potential side effects and is willing to proceed IV venofer treatments. She prefers me to schedule IV venofer treatments for her and she will further discuss with her husband. If she changes her mind, she will call and  cancel.  Plan IV venofer weekly x 1-2  Orders Placed This Encounter  Procedures  . CBC with Differential/Platelet    Standing Status:   Future    Number of Occurrences:   1    Standing Expiration Date:   02/23/2021  . Technologist smear review    Standing Status:   Future    Number of Occurrences:   1    Standing Expiration Date:   02/23/2021  . Retic Panel    Standing Status:   Future    Number of Occurrences:   1    Standing Expiration Date:   02/23/2021    All questions were answered. The patient knows to call the clinic with any problems questions or concerns.  Cc Schuman, Christanna R, *  Return of visit: 8 weeks Thank you for this kind referral and the opportunity to participate in the care of this patient. A copy of today's note is routed to referring provider   Rickard Patience, MD, PhD Hematology Oncology [facility] 02/24/2020

## 2020-02-28 ENCOUNTER — Observation Stay
Admission: EM | Admit: 2020-02-28 | Discharge: 2020-02-28 | Disposition: A | Discharge status: Home / Self Care | Payer: Medicaid Other | Attending: Certified Nurse Midwife | Admitting: Certified Nurse Midwife

## 2020-02-28 ENCOUNTER — Encounter: Payer: Self-pay | Admitting: Obstetrics & Gynecology

## 2020-02-28 ENCOUNTER — Other Ambulatory Visit: Payer: Self-pay

## 2020-02-28 DIAGNOSIS — O26893 Other specified pregnancy related conditions, third trimester: Secondary | ICD-10-CM

## 2020-02-28 DIAGNOSIS — O4703 False labor before 37 completed weeks of gestation, third trimester: Principal | ICD-10-CM

## 2020-02-28 DIAGNOSIS — M25559 Pain in unspecified hip: Secondary | ICD-10-CM

## 2020-02-28 DIAGNOSIS — Z6834 Body mass index (BMI) 34.0-34.9, adult: Secondary | ICD-10-CM

## 2020-02-28 DIAGNOSIS — O99891 Other specified diseases and conditions complicating pregnancy: Secondary | ICD-10-CM

## 2020-02-28 DIAGNOSIS — M549 Dorsalgia, unspecified: Secondary | ICD-10-CM

## 2020-02-28 DIAGNOSIS — Z3A35 35 weeks gestation of pregnancy: Secondary | ICD-10-CM | POA: Diagnosis not present

## 2020-02-28 DIAGNOSIS — O0990 Supervision of high risk pregnancy, unspecified, unspecified trimester: Secondary | ICD-10-CM

## 2020-02-28 HISTORY — DX: Dorsalgia, unspecified: M54.9

## 2020-02-28 HISTORY — DX: Other specified diseases and conditions complicating pregnancy: O99.891

## 2020-02-28 LAB — URINALYSIS, COMPLETE (UACMP) WITH MICROSCOPIC
Bacteria, UA: NONE SEEN
Bilirubin Urine: NEGATIVE
Glucose, UA: NEGATIVE mg/dL
Hgb urine dipstick: NEGATIVE
Ketones, ur: NEGATIVE mg/dL
Leukocytes,Ua: NEGATIVE
Nitrite: NEGATIVE
Protein, ur: NEGATIVE mg/dL
Specific Gravity, Urine: 1.004 — ABNORMAL LOW (ref 1.005–1.030)
pH: 7 (ref 5.0–8.0)

## 2020-02-28 LAB — CHLAMYDIA/NGC RT PCR (ARMC ONLY)
Chlamydia Tr: NOT DETECTED
N gonorrhoeae: NOT DETECTED

## 2020-02-28 NOTE — Telephone Encounter (Signed)
Pt called office stating she has been having ctxs every 2-54minutes apart for the last 4-5 hours.  Adv pt to go to L&D via ED.  Mary notified.

## 2020-02-28 NOTE — OB Triage Note (Signed)
Patient [id number] [redacted]w[redacted]d complains of ctx since yesterday around 1700-1800. Patient reports that contractions were uncomfortable in her back "like period cramps". She reports increase in discomfort since contractions last night and around 1200 this afternoon the contractions began to get more intense in the back. She reports last intercourse being a week ago and has noted some diarrhea today (1 occurrence). Patient denies vaginal bleeding, discharge, leaking of fluid and reports + fetal movement. Support person at bedside. Vital signs stable. Will continue to monitor.

## 2020-02-28 NOTE — Telephone Encounter (Signed)
Okay sounds good 

## 2020-02-28 NOTE — Progress Notes (Signed)
RN reviewed discharge paperwork with patient and all questions answered. Patient discharged home with support person and labor precautions reviewed. Patient verbalized understanding.

## 2020-02-28 NOTE — Final Progress Note (Signed)
Physician Final Progress Note  Patient ID: Anna Flores MRN: [id number] DOB/AGE: [birth year] 29 y.o.  Admit date: 02/28/2020 Admitting provider: Nadara Mustard, MD/ Gasper Lloyd. Sharen Hones, CNM Discharge date: 02/28/2020   Admission Diagnoses: IUP at 35weeks 1 day gestation with contractions  Discharge Diagnoses:  IUP at 35.1 weeks with preterm contractions/ false labor  Consults: None  Significant Findings/ Diagnostic Studies:  OB History & Physical   History of Present Illness:  Chief Complaint:  Complains of contractions since last evening that have become more intense. HPI:  Anna Flores is a 29 y.o. [phone] female with EDC= 04/02/2020 at [redacted]w[redacted]d dated by LMP=6 week ultrasound.  Her pregnancy has been complicated by left hip pain, obesity, and iron deficiency anemia.  She presents to L&D for evaluation of contractions. .  Patient states that she had lower abdominal cramping and frequent urination that started around 1700 last night. Then today at 1200 noon began having lower back pain radiating to the lower abdomen associated with uterine tightening and pelvic pressure. Rates the contraction pain at 4/10. Timed the contractions earlier this afternoon and contractions got as close as every 2-5 minutes apart. Baby active. No vaginal bleeding or leakage of fluid. Denies dysuria, increased vaginal discharge, or vulvar itching or irritation. Drinking fluids frequently. History of 4 term vaginal deliveries. No recent intercourse.   Prenatal care site: Prenatal care at [facility] has been remarkable for   Clinic [facility] Prenatal Labs  Dating  LMP = 6wk [place] Blood type: O/Positive/-- (01/15 1008)   Genetic Screen  NIPS: normal XX Antibody:Negative (01/15 1008)  Anatomic [place] complete Rubella: 6.27 (01/15 1008)  Varicella: Immune  GTT Early: 124 Third trimester: 103 RPR: Non Reactive (01/15 1008)   Rhogam  not needed HBsAg: Negative (01/15 1008)   TDaP vaccine      Declines   Flu Shot:  HIV: Non Reactive (01/15 1008)   Baby Food       Breast                         GBS:   Contraception  vasectomy Pap: NIL 2020  CBB     CS/VBAC    Support Person  [name]         Maternal Medical History:   Past Medical History:  Diagnosis Date  . Abdominal pain 10/26/2013  . Anemia 2013  . Anxiety   . Depression   . History of Papanicolaou smear of cervix 10/02/2016   NEG  . Menometrorrhagia 10/26/2013  . Pregnancy    DELIVERED 08/12/16 - JEG    Past Surgical History:  Procedure Laterality Date  . DIAGNOSTIC LAPAROSCOPY  2018   suspected ectopic pregnancy-[facility]  . TONSILLECTOMY      Allergies  Allergen Reactions  . Amoxicillin Hives  . Hydrocodone Shortness Of Breath and Other (See Comments)    "loss of consciousness"; "felt really weird" when she was given 10 mgm hydrocodone. Did well with 5 mgm tablet  . Reglan [Metoclopramide] Other (See Comments)  . Cherry Hives    Prior to Admission medications   Medication Sig Start Date End Date Taking? Authorizing Provider  acetaminophen (TYLENOL) 500 MG tablet Take 1,000 mg by mouth every 6 (six) hours as needed.   Yes [provider]  acetaminophen-codeine (TYLENOL #3) 300-30 MG tablet Take 1 tablet by mouth every 4 (four) hours as needed for moderate pain. 02/10/20   Schuman, Jaquelyn Bitter, MD  prochlorperazine (COMPAZINE) 10 MG  tablet Take 1 tablet (10 mg total) by mouth every 6 (six) hours as needed for nausea or vomiting (headache). 09/28/19 12/07/19  Conard Novak, MD          Social History: She  reports that she has never smoked. She has never used smokeless tobacco. She reports that she does not drink alcohol and does not use drugs.  Family History: family history includes Cirrhosis in her father; Nevi in her sister; Ovarian cancer in her mother. She was adopted.   Review of Systems: Negative x 10 systems reviewed except as noted in the HPI.      Physical Exam:  Vital Signs: BP 114/80 (BP Location:  Left Arm)   Pulse 91   Temp 98.2 F (36.8 C) (Oral)   Resp 16   Ht 5\' 5"  (1.651 m)   Wt 91.6 kg   LMP 06/27/2019 (Exact Date)   BMI 33.61 kg/m  General: gravid, WF, no acute distress.  HEENT: normocephalic, atraumatic Heart: regular rate  Lungs: normal respiratory effort Abdomen: soft, gravid, non-tender Pelvic:   External: Normal external female genitalia  Cervix: Dilation: Fingertip / Effacement (%): 50 / Station: -2 on arrival, then FT/50%/-3  after 1.5 hours  Wet [place]: - clue cells; - trichomonas;  - yeast  Extremities: non-tender, symmetric, no edema bilaterally.   Neurologic: Alert & oriented x 3.   Baseline FHR: 140 baseline with accelerations to 160s to 170, moderate variability Toco: contractions irregular, every 4-9 minutes apart, spaced out with rest  Results for orders placed or performed during the hospital encounter of 02/28/20 (from the past 24 hour(s))  Chlamydia/NGC rt PCR ([facility] only)     Status: None   Collection Time: 02/28/20  4:54 PM   Specimen: Cervical/Vaginal swab; Genital  Result Value Ref Range   Specimen source GC/Chlam VAGINA    Chlamydia Tr NOT DETECTED NOT DETECTED   N gonorrhoeae NOT DETECTED NOT DETECTED  Urinalysis, Complete w Microscopic     Status: Abnormal   Collection Time: 02/28/20  5:18 PM  Result Value Ref Range   Color, Urine STRAW (A) YELLOW   APPearance CLEAR (A) CLEAR   Specific Gravity, Urine 1.004 (L) 1.005 - 1.030   pH 7.0 5.0 - 8.0   Glucose, UA NEGATIVE NEGATIVE mg/dL   Hgb urine dipstick NEGATIVE NEGATIVE   Bilirubin Urine NEGATIVE NEGATIVE   Ketones, ur NEGATIVE NEGATIVE mg/dL   Protein, ur NEGATIVE NEGATIVE mg/dL   Nitrite NEGATIVE NEGATIVE   Leukocytes,Ua NEGATIVE NEGATIVE   RBC / HPF 0-5 0 - 5 RBC/hpf   WBC, UA 0-5 0 - 5 WBC/hpf   Bacteria, UA NONE SEEN NONE SEEN   Squamous Epithelial / LPF 0-5 0 - 5   Assessment:  Anna Flores is a 29 y.o. 34 female at [redacted]w[redacted]d with preterm contractions. No cervical change  after 1.5 hours of observation and contractions irregular and mild Discharged home with preterm labor precautions Follow [facility] in [facility] as scheduled    Procedures: none  Discharge Condition: stable  Disposition: Discharge disposition: 01-Home or Self Care       Diet: Regular diet  Discharge Activity: Activity as tolerated  Discharge Instructions    Discharge patient   Complete by: As directed    Discharge disposition: 01-Home or Self Care   Discharge patient date: 02/28/2020     Allergies as of 02/28/2020      Reactions   Amoxicillin Hives   Hydrocodone Shortness Of Breath, Other (See  Comments)   "loss of consciousness"; "felt really weird" when she was given 10 mgm hydrocodone. Did well with 5 mgm tablet   Reglan [metoclopramide] Other (See Comments)   Cherry Hives      Medication List    TAKE these medications   acetaminophen 500 MG tablet Commonly known as: TYLENOL Take 1,000 mg by mouth every 6 (six) hours as needed.   acetaminophen-codeine 300-30 MG tablet Commonly known as: TYLENOL #3 Take 1 tablet by mouth every 4 (four) hours as needed for moderate pain.        Total time spent taking care of this patient: 30 minutes  Signed: Farrel Conners 02/28/2020, 6:01 PM

## 2020-02-29 ENCOUNTER — Encounter: Payer: Self-pay | Admitting: Oncology

## 2020-02-29 ENCOUNTER — Other Ambulatory Visit: Payer: Self-pay | Admitting: Oncology

## 2020-02-29 ENCOUNTER — Telehealth: Payer: Self-pay

## 2020-02-29 DIAGNOSIS — D508 Other iron deficiency anemias: Secondary | ICD-10-CM

## 2020-02-29 DIAGNOSIS — D509 Iron deficiency anemia, unspecified: Secondary | ICD-10-CM

## 2020-02-29 HISTORY — DX: Iron deficiency anemia, unspecified: D50.9

## 2020-02-29 NOTE — Telephone Encounter (Signed)
Left message for patient:    Dr. Cathie Hoops would like to confirm that patient wanted to proceed with Venofer.  Her last MD visit patient wanted to discuss with husband.  If she does want to proceed with Venofer she is to keep appt for 03/02/20 infusion and schedule another Venofer in 2 weeks. Lab md cbc iron tibc ferritin in 3 months, labs prior, virtual MD +/- Venofer.

## 2020-02-29 NOTE — Telephone Encounter (Signed)
Spoke with patient and she would like to proceed with IV Venofer this week but would like to wait before getting the 2 week dose scheduled.  Would prefer to see how she tolerates the first infusion and where she is with her pregnancy.

## 2020-03-01 ENCOUNTER — Ambulatory Visit: Payer: Medicaid Other

## 2020-03-01 ENCOUNTER — Other Ambulatory Visit: Payer: Self-pay

## 2020-03-01 DIAGNOSIS — R293 Abnormal posture: Secondary | ICD-10-CM | POA: Diagnosis not present

## 2020-03-01 DIAGNOSIS — M533 Sacrococcygeal disorders, not elsewhere classified: Secondary | ICD-10-CM

## 2020-03-01 DIAGNOSIS — M62838 Other muscle spasm: Secondary | ICD-10-CM

## 2020-03-01 NOTE — Patient Instructions (Signed)
   Tuck your hips under, then keep the tuck as you lean forward so you feel a stretch through your low back. Hold for 5 deep breaths, repeat 2-3 times, 1-2 times per day.  .  Do 2 sets of 15 tilts per day. Breathe in when you tilt forward (A) and out when you tuck under (B).
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
## 2020-03-02 ENCOUNTER — Inpatient Hospital Stay: Payer: Medicaid Other

## 2020-03-02 ENCOUNTER — Other Ambulatory Visit: Payer: Self-pay

## 2020-03-02 VITALS — BP 104/67 | HR 89 | Temp 98.6°F | Resp 20

## 2020-03-02 DIAGNOSIS — R5383 Other fatigue: Secondary | ICD-10-CM | POA: Diagnosis not present

## 2020-03-02 DIAGNOSIS — D508 Other iron deficiency anemias: Secondary | ICD-10-CM | POA: Diagnosis not present

## 2020-03-02 DIAGNOSIS — R0609 Other forms of dyspnea: Secondary | ICD-10-CM | POA: Diagnosis not present

## 2020-03-02 DIAGNOSIS — O99013 Anemia complicating pregnancy, third trimester: Secondary | ICD-10-CM | POA: Diagnosis not present

## 2020-03-02 MED ORDER — IRON SUCROSE 20 MG/ML IV SOLN
200.0000 mg | Freq: Once | INTRAVENOUS | Status: AC
  Administered 2020-03-02: 200 mg via INTRAVENOUS
  Filled 2020-03-02: qty 10

## 2020-03-02 MED ORDER — SODIUM CHLORIDE 0.9 % IV SOLN: 200.0000 mg | Freq: Once | INTRAVENOUS | Status: DC

## 2020-03-02 MED ORDER — SODIUM CHLORIDE 0.9 % IV SOLN
Freq: Once | INTRAVENOUS | Status: AC
  Filled 2020-03-02: qty 250

## 2020-03-02 NOTE — Telephone Encounter (Signed)
Patient request the 2 week IV Venofer be scheduled.

## 2020-03-06 ENCOUNTER — Other Ambulatory Visit: Payer: Self-pay

## 2020-03-06 ENCOUNTER — Ambulatory Visit (INDEPENDENT_AMBULATORY_CARE_PROVIDER_SITE_OTHER): Payer: Medicaid Other | Admitting: Obstetrics and Gynecology

## 2020-03-06 ENCOUNTER — Encounter: Payer: Self-pay | Admitting: Obstetrics and Gynecology

## 2020-03-06 VITALS — BP 122/74 | Wt 206.0 lb

## 2020-03-06 DIAGNOSIS — O99213 Obesity complicating pregnancy, third trimester: Secondary | ICD-10-CM

## 2020-03-06 DIAGNOSIS — O09299 Supervision of pregnancy with other poor reproductive or obstetric history, unspecified trimester: Secondary | ICD-10-CM

## 2020-03-06 DIAGNOSIS — O368130 Decreased fetal movements, third trimester, not applicable or unspecified: Secondary | ICD-10-CM | POA: Diagnosis not present

## 2020-03-06 DIAGNOSIS — Z3A36 36 weeks gestation of pregnancy: Secondary | ICD-10-CM | POA: Diagnosis not present

## 2020-03-06 DIAGNOSIS — O0993 Supervision of high risk pregnancy, unspecified, third trimester: Secondary | ICD-10-CM

## 2020-03-06 DIAGNOSIS — Z8632 Personal history of gestational diabetes: Secondary | ICD-10-CM
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
## 2020-03-07 ENCOUNTER — Telehealth: Payer: Self-pay

## 2020-03-07 ENCOUNTER — Observation Stay
Admission: EM | Admit: 2020-03-07 | Discharge: 2020-03-07 | Disposition: A | Discharge status: Home / Self Care | Payer: Medicaid Other | Attending: Obstetrics and Gynecology | Admitting: Obstetrics and Gynecology

## 2020-03-07 ENCOUNTER — Other Ambulatory Visit: Payer: Self-pay

## 2020-03-07 ENCOUNTER — Encounter: Payer: Self-pay | Admitting: Obstetrics and Gynecology

## 2020-03-07 DIAGNOSIS — O4693 Antepartum hemorrhage, unspecified, third trimester: Secondary | ICD-10-CM | POA: Diagnosis not present

## 2020-03-07 DIAGNOSIS — Z3A36 36 weeks gestation of pregnancy: Secondary | ICD-10-CM

## 2020-03-07 DIAGNOSIS — O0993 Supervision of high risk pregnancy, unspecified, third trimester: Secondary | ICD-10-CM | POA: Diagnosis not present

## 2020-03-07 LAB — COMPREHENSIVE METABOLIC PANEL
ALT: 11 U/L (ref 0–44)
AST: 16 U/L (ref 15–41)
Albumin: 3.3 g/dL — ABNORMAL LOW (ref 3.5–5.0)
Alkaline Phosphatase: 108 U/L (ref 38–126)
Anion gap: 10 (ref 5–15)
BUN: 7 mg/dL (ref 6–20)
CO2: 23 mmol/L (ref 22–32)
Calcium: 9.2 mg/dL (ref 8.9–10.3)
Chloride: 103 mmol/L (ref 98–111)
Creatinine, Ser: 0.64 mg/dL (ref 0.44–1.00)
GFR calc Af Amer: >60 mL/min (ref >60)
GFR calc non Af Amer: >60 mL/min (ref >60)
Glucose, Bld: 97 mg/dL (ref 70–99)
Potassium: 3.9 mmol/L (ref 3.5–5.1)
Sodium: 136 mmol/L (ref 135–145)
Total Bilirubin: 0.7 mg/dL (ref 0.3–1.2)
Total Protein: 6.2 g/dL — ABNORMAL LOW (ref 6.5–8.1)

## 2020-03-07 LAB — PROTIME-INR
INR: 1 (ref 0.8–1.2)
Prothrombin Time: 12.8 seconds (ref 11.4–15.2)

## 2020-03-07 LAB — CBC
HCT: 30 % — ABNORMAL LOW (ref 36.0–46.0)
Hemoglobin: 10.3 g/dL — ABNORMAL LOW (ref 12.0–15.0)
MCH: 28.8 pg (ref 26.0–34.0)
MCHC: 34.3 g/dL (ref 30.0–36.0)
MCV: 83.8 fL (ref 80.0–100.0)
Platelets: 187 10*3/uL (ref 150–400)
RBC: 3.58 MIL/uL — ABNORMAL LOW (ref 3.87–5.11)
RDW: 14.6 % (ref 11.5–15.5)
WBC: 7.5 10*3/uL (ref 4.0–10.5)
nRBC: 0 % (ref 0.0–0.2)

## 2020-03-07 LAB — KLEIHAUER-BETKE STAIN
Fetal Cells %: 0 %
Quantitation Fetal Hemoglobin: 0 mL

## 2020-03-07 LAB — FIBRINOGEN: Fibrinogen: 564 mg/dL — ABNORMAL HIGH (ref 210–475)

## 2020-03-07 LAB — APTT: aPTT: 26 seconds (ref 24–36)

## 2020-03-07 LAB — LIPASE, BLOOD: Lipase: 26 U/L (ref 11–51)

## 2020-03-07 NOTE — Telephone Encounter (Signed)
Pt states she was seen by SDJ yesterday. She had a cervical check and GBS done. He told her she would have spotting. The spotting has been consistent and is now bright red. Sm. Cramping. Please advise if she should go to L&D or been seen in clinic

## 2020-03-07 NOTE — Discharge Summary (Signed)
Physician Final Progress Note  Patient ID: Anna Flores MRN: [id number] DOB/AGE: 01/10/1991 29 y.o.  Admit date: 03/07/2020 Admitting provider: Vena Austria, MD Discharge date: 03/07/2020   Admission Diagnoses: Vaginal bleeding third trimester and pressure  Discharge Diagnoses:  Active Problems:   Indication for care in labor and delivery, antepartum  29 y.o. [id number] at [redacted]w[redacted]d by Estimated Date of Delivery: 04/02/20 presenting with vaginal spotting after clinic cervical check yesterday.  Also reports loss of mucous plug.  +FM, no LOF, irregular contractions.  Pregnancy uncomplicated to date.  Reactive NST, no bleeding visualized by nursing staff.  Labs normal KB pending at time of discharge.  Blood pressure 114/71, pulse 90, temperature 98.4 F (36.9 C), temperature source Oral, resp. rate 16, last menstrual period 06/27/2019, unknown if currently breastfeeding.  Pregnancy #6 Problems (from 06/27/19 to present)    Problem Noted Resolved   Pregnancy related hip pain in third trimester, antepartum 02/17/2020 by [name], MD No   BMI 34.0-34.9,adult 08/18/2019 by Conard Novak, MD No   Supervision of high risk pregnancy, antepartum 05/03/2019 by Nadara Mustard, MD No   Overview Addendum 02/10/2020 11:31 AM by [name], MD    Clinic [facility] Prenatal Labs  Dating  LMP = 6wk [place] Blood type: O/Positive/-- (01/15 1008)   Genetic Screen  NIPS: normal XX Antibody:Negative (01/15 1008)  Anatomic [place] complete Rubella: 6.27 (01/15 1008)  Varicella: Immune  GTT Early: 124 Third trimester: 103 RPR: Non Reactive (01/15 1008)   Rhogam  not needed HBsAg: Negative (01/15 1008)   TDaP vaccine      Declines   Flu Shot: HIV: Non Reactive (01/15 1008)   Baby Food       Breast                         GBS:   Contraception  vasectomy Pap: NIL 2020  CBB     CS/VBAC    Support Person  [name]           Previous Version      Consults: None  Significant Findings/  Diagnostic Studies:  Results for orders placed or performed during the hospital encounter of 03/07/20 (from the past 24 hour(s))  APTT     Status: None   Collection Time: 03/07/20  5:48 PM  Result Value Ref Range   aPTT 26 24 - 36 seconds  Protime-INR     Status: None   Collection Time: 03/07/20  5:48 PM  Result Value Ref Range   Prothrombin Time 12.8 11.4 - 15.2 seconds   INR 1.0 0.8 - 1.2  Fibrinogen     Status: Abnormal   Collection Time: 03/07/20  5:48 PM  Result Value Ref Range   Fibrinogen 564 (H) 210 - 475 mg/dL  CBC     Status: Abnormal   Collection Time: 03/07/20  5:48 PM  Result Value Ref Range   WBC 7.5 4.0 - 10.5 K/uL   RBC 3.58 (L) 3.87 - 5.11 MIL/uL   Hemoglobin 10.3 (L) 12.0 - 15.0 g/dL   HCT 99.3 (L) 36 - 46 %   MCV 83.8 80.0 - 100.0 fL   MCH 28.8 26.0 - 34.0 pg   MCHC 34.3 30.0 - 36.0 g/dL   RDW 57.0 17.7 - 93.9 %   Platelets 187 150 - 400 K/uL   nRBC 0.0 0.0 - 0.2 %  Lipase, blood     Status: None   Collection Time:  03/07/20  5:48 PM  Result Value Ref Range   Lipase 26 11 - 51 U/L  Comprehensive metabolic panel     Status: Abnormal   Collection Time: 03/07/20  5:48 PM  Result Value Ref Range   Sodium 136 135 - 145 mmol/L   Potassium 3.9 3.5 - 5.1 mmol/L   Chloride 103 98 - 111 mmol/L   CO2 23 22 - 32 mmol/L   Glucose, Bld 97 70 - 99 mg/dL   BUN 7 6 - 20 mg/dL   Creatinine, Ser 7.37 0.44 - 1.00 mg/dL   Calcium 9.2 8.9 - 10.6 mg/dL   Total Protein 6.2 (L) 6.5 - 8.1 g/dL   Albumin 3.3 (L) 3.5 - 5.0 g/dL   AST 16 15 - 41 U/L   ALT 11 0 - 44 U/L   Alkaline Phosphatase 108 38 - 126 U/L   Total Bilirubin 0.7 0.3 - 1.2 mg/dL   GFR calc non Af Amer >60 >60 mL/min   GFR calc Af Amer >60 >60 mL/min   Anion gap 10 5 - 15     Procedures:  Baseline: 145 Variability: moderate Accelerations: present Decelerations: absent Tocometry: none The patient was monitored for 30 minutes, fetal heart rate tracing was deemed reactive, category I tracing,  CPT  [id number]   Discharge Condition: good  Disposition: Discharge disposition: 01-Home or Self Care       Diet: Regular diet  Discharge Activity: Activity as tolerated  Discharge Instructions    Discharge activity:  No Restrictions   Complete by: As directed    Discharge diet:  No restrictions   Complete by: As directed    Fetal Kick Count:  Lie on our left side for one hour after a meal, and count the number of times your baby kicks.  If it is less than 5 times, get up, move around and drink some juice.  Repeat the test 30 minutes later.  If it is still less than 5 kicks in an hour, notify your doctor.   Complete by: As directed    LABOR:  When conractions begin, you should start to time them from the beginning of one contraction to the beginning  of the next.  When contractions are 5 - 10 minutes apart or less and have been regular for at least an hour, you should call your health care provider.   Complete by: As directed    No sexual activity restrictions   Complete by: As directed    Notify physician for bleeding from the vagina   Complete by: As directed    Notify physician for blurring of vision or spots before the eyes   Complete by: As directed    Notify physician for chills or fever   Complete by: As directed    Notify physician for fainting spells, "black outs" or loss of consciousness   Complete by: As directed    Notify physician for increase in vaginal discharge   Complete by: As directed    Notify physician for leaking of fluid   Complete by: As directed    Notify physician for pain or burning when urinating   Complete by: As directed    Notify physician for pelvic pressure (sudden increase)   Complete by: As directed    Notify physician for severe or continued nausea or vomiting   Complete by: As directed    Notify physician for sudden gushing of fluid from the vagina (with or without continued leaking)   Complete  by: As directed    Notify physician for sudden,  constant, or occasional abdominal pain   Complete by: As directed    Notify physician if baby moving less than usual   Complete by: As directed      Allergies as of 03/07/2020      Reactions   Amoxicillin Hives   Hydrocodone Shortness Of Breath, Other (See Comments)   "loss of consciousness"; "felt really weird" when she was given 10 mgm hydrocodone. Did well with 5 mgm tablet   Reglan [metoclopramide] Other (See Comments)   Cherry Hives      Medication List    STOP taking these medications   acetaminophen-codeine 300-30 MG tablet Commonly known as: TYLENOL #3     TAKE these medications   acetaminophen 500 MG tablet Commonly known as: TYLENOL Take 1,000 mg by mouth every 6 (six) hours as needed.        Total time spent taking care of this patient: 30 minutes  Signed: Vena Austria 03/07/2020, 8:13 PM

## 2020-03-07 NOTE — Telephone Encounter (Signed)
Per SDJ if she is still having spotting and cramping she should go to L&D to be checked out

## 2020-03-07 NOTE — Progress Notes (Signed)
Discharge instructions, no new prescriptions, education, and appointments given and explained. Pt verbalized understanding with no further questions. Pt denied need for wheelchair. Pt ambulated to personal vehicle.

## 2020-03-07 NOTE — Telephone Encounter (Signed)
Pt is aware.  

## 2020-03-10 ENCOUNTER — Ambulatory Visit: Payer: Medicaid Other | Attending: Obstetrics and Gynecology

## 2020-03-11 LAB — STREP GP B CULTURE+RFLX: Strep Gp B Culture+Rflx: NEGATIVE

## 2020-03-13 ENCOUNTER — Encounter: Payer: Self-pay | Admitting: Obstetrics and Gynecology

## 2020-03-13 ENCOUNTER — Other Ambulatory Visit: Payer: Self-pay

## 2020-03-13 ENCOUNTER — Telehealth (INDEPENDENT_AMBULATORY_CARE_PROVIDER_SITE_OTHER): Payer: Medicaid Other | Admitting: Obstetrics and Gynecology

## 2020-03-13 DIAGNOSIS — O288 Other abnormal findings on antenatal screening of mother: Secondary | ICD-10-CM

## 2020-03-13 DIAGNOSIS — Z3A37 37 weeks gestation of pregnancy: Secondary | ICD-10-CM

## 2020-03-13 DIAGNOSIS — O99213 Obesity complicating pregnancy, third trimester: Secondary | ICD-10-CM

## 2020-03-13 DIAGNOSIS — O0993 Supervision of high risk pregnancy, unspecified, third trimester: Secondary | ICD-10-CM
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
## 2020-03-16 ENCOUNTER — Other Ambulatory Visit: Payer: Self-pay

## 2020-03-16 ENCOUNTER — Inpatient Hospital Stay: Payer: Medicaid Other | Attending: Oncology

## 2020-03-16 VITALS — BP 106/68 | HR 98 | Temp 97.0°F

## 2020-03-16 DIAGNOSIS — D509 Iron deficiency anemia, unspecified: Secondary | ICD-10-CM | POA: Insufficient documentation

## 2020-03-16 DIAGNOSIS — D508 Other iron deficiency anemias: Secondary | ICD-10-CM

## 2020-03-16 MED ORDER — IRON SUCROSE 20 MG/ML IV SOLN
200.0000 mg | Freq: Once | INTRAVENOUS | Status: AC
  Administered 2020-03-16: 200 mg via INTRAVENOUS
  Filled 2020-03-16: qty 10

## 2020-03-16 MED ORDER — SODIUM CHLORIDE 0.9 % IV SOLN: 200.0000 mg | Freq: Once | INTRAVENOUS | Status: DC

## 2020-03-16 MED ORDER — SODIUM CHLORIDE 0.9 % IV SOLN
Freq: Once | INTRAVENOUS | Status: AC
  Filled 2020-03-16: qty 250

## 2020-03-17 ENCOUNTER — Ambulatory Visit: Payer: Medicaid Other

## 2020-03-21 ENCOUNTER — Ambulatory Visit (INDEPENDENT_AMBULATORY_CARE_PROVIDER_SITE_OTHER): Payer: Medicaid Other | Admitting: Obstetrics and Gynecology

## 2020-03-21 ENCOUNTER — Other Ambulatory Visit: Payer: Self-pay

## 2020-03-21 ENCOUNTER — Other Ambulatory Visit: Payer: Self-pay | Admitting: Obstetrics and Gynecology

## 2020-03-21 ENCOUNTER — Ambulatory Visit (INDEPENDENT_AMBULATORY_CARE_PROVIDER_SITE_OTHER): Payer: Medicaid Other

## 2020-03-21 ENCOUNTER — Encounter: Payer: Self-pay | Admitting: Obstetrics and Gynecology

## 2020-03-21 VITALS — BP 110/70 | Ht 65.0 in | Wt 208.8 lb

## 2020-03-21 DIAGNOSIS — Z3A38 38 weeks gestation of pregnancy: Secondary | ICD-10-CM | POA: Diagnosis not present

## 2020-03-21 DIAGNOSIS — O288 Other abnormal findings on antenatal screening of mother: Secondary | ICD-10-CM

## 2020-03-21 DIAGNOSIS — O0993 Supervision of high risk pregnancy, unspecified, third trimester: Secondary | ICD-10-CM

## 2020-03-21 DIAGNOSIS — O99213 Obesity complicating pregnancy, third trimester: Secondary | ICD-10-CM

## 2020-03-21 LAB — POCT URINALYSIS DIPSTICK OB
Glucose, UA: NEGATIVE
POC,PROTEIN,UA: NEGATIVE
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
## 2020-03-22 ENCOUNTER — Encounter: Payer: Self-pay | Admitting: Obstetrics and Gynecology

## 2020-03-22 ENCOUNTER — Other Ambulatory Visit: Payer: Self-pay

## 2020-03-22 ENCOUNTER — Observation Stay
Admission: EM | Admit: 2020-03-22 | Discharge: 2020-03-22 | Disposition: A | Discharge status: Home / Self Care | Payer: Medicaid Other | Attending: Obstetrics and Gynecology | Admitting: Obstetrics and Gynecology

## 2020-03-22 DIAGNOSIS — O26853 Spotting complicating pregnancy, third trimester: Secondary | ICD-10-CM | POA: Diagnosis not present

## 2020-03-22 DIAGNOSIS — O26893 Other specified pregnancy related conditions, third trimester: Secondary | ICD-10-CM

## 2020-03-22 DIAGNOSIS — O4693 Antepartum hemorrhage, unspecified, third trimester: Secondary | ICD-10-CM

## 2020-03-22 DIAGNOSIS — O0990 Supervision of high risk pregnancy, unspecified, unspecified trimester: Secondary | ICD-10-CM

## 2020-03-22 DIAGNOSIS — M25559 Pain in unspecified hip: Secondary | ICD-10-CM

## 2020-03-22 DIAGNOSIS — Z3A38 38 weeks gestation of pregnancy: Secondary | ICD-10-CM | POA: Diagnosis not present

## 2020-03-22 DIAGNOSIS — Z6834 Body mass index (BMI) 34.0-34.9, adult: Secondary | ICD-10-CM

## 2020-03-22 MED ORDER — ACETAMINOPHEN 325 MG PO TABS: 650.0000 mg | ORAL_TABLET | ORAL | Status: DC | PRN

## 2020-03-22 NOTE — OB Triage Note (Signed)
Pt c/o ctx and bloody show at home.

## 2020-03-22 NOTE — Discharge Summary (Signed)
Physician Final Progress Note  Patient ID: Anna Flores MRN: [id number] DOB/AGE: 12-29-1990 29 y.o.  Admit date: 03/22/2020 Admitting provider: Vena Austria, MD Discharge date: 03/22/2020   Admission Diagnoses: Vaginal bleeding third trimester  Discharge Diagnoses:  Active Problems:   Labor and delivery indication for care or intervention  29 y.o. [id number] at [redacted]w[redacted]d presenting after membrane sweep yesterday 03/21/2020 in clinic with some vaginal bleeding at home.  No bleeding on exam.  Irregular contractions, with stable cervical check.  No LOF, +FM.  Pregnancy #6 Problems (from 06/27/19 to present)    Problem Noted Resolved   Pregnancy related hip pain in third trimester, antepartum 02/17/2020 by [name], MD No   BMI 34.0-34.9,adult 08/18/2019 by Conard Novak, MD No   Supervision of high risk pregnancy, antepartum 05/03/2019 by Nadara Mustard, MD No   Overview Addendum 02/10/2020 11:31 AM by [name], MD    [facility] Prenatal Labs  Dating  LMP = 6wk [place] Blood type: O/Positive/-- (01/15 1008)   Genetic Screen  NIPS: normal XX Antibody:Negative (01/15 1008)  Anatomic [place] complete Rubella: 6.27 (01/15 1008)  Varicella: Immune  GTT Early: 124 Third trimester: 103 RPR: Non Reactive (01/15 1008)   Rhogam  not needed HBsAg: Negative (01/15 1008)   TDaP vaccine      Declines   Flu Shot: HIV: Non Reactive (01/15 1008)   Baby Food       Breast                         GBS:   Contraception  vasectomy Pap: NIL 2020  CBB     CS/VBAC    Support Person  [name]           Previous Version       Temp:  [98.7 F (37.1 C)] 98.7 F (37.1 C) (08/18 1629) Pulse Rate:  [111] 111 (08/18 1629) Resp:  [14] 14 (08/18 1629) BP: (130)/(77) 130/77 (08/18 1629)   Consults: None  Significant Findings/ Diagnostic Studies: none  Procedures: Baseline: 140 Variability: moderate Accelerations: present Decelerations: absent Tocometry: irregualr q8-65min The  patient was monitored for 30 minutes, fetal heart rate tracing was deemed reactive, category I tracing,  CPT [id number]   Discharge Condition: good  Disposition: Discharge disposition: 01-Home or Self Care       Diet: Regular diet  Discharge Activity: Activity as tolerated  Discharge Instructions    Discharge activity:  No Restrictions   Complete by: As directed    Discharge diet:  No restrictions   Complete by: As directed    Fetal Kick Count:  Lie on our left side for one hour after a meal, and count the number of times your baby kicks.  If it is less than 5 times, get up, move around and drink some juice.  Repeat the test 30 minutes later.  If it is still less than 5 kicks in an hour, notify your doctor.   Complete by: As directed    LABOR:  When conractions begin, you should start to time them from the beginning of one contraction to the beginning  of the next.  When contractions are 5 - 10 minutes apart or less and have been regular for at least an hour, you should call your health care provider.   Complete by: As directed    No sexual activity restrictions   Complete by: As directed    Notify physician for bleeding from the  vagina   Complete by: As directed    Notify physician for blurring of vision or spots before the eyes   Complete by: As directed    Notify physician for chills or fever   Complete by: As directed    Notify physician for fainting spells, "black outs" or loss of consciousness   Complete by: As directed    Notify physician for increase in vaginal discharge   Complete by: As directed    Notify physician for leaking of fluid   Complete by: As directed    Notify physician for pain or burning when urinating   Complete by: As directed    Notify physician for pelvic pressure (sudden increase)   Complete by: As directed    Notify physician for severe or continued nausea or vomiting   Complete by: As directed    Notify physician for sudden gushing of fluid  from the vagina (with or without continued leaking)   Complete by: As directed    Notify physician for sudden, constant, or occasional abdominal pain   Complete by: As directed    Notify physician if baby moving less than usual   Complete by: As directed      Allergies as of 03/22/2020      Reactions   Amoxicillin Hives   Hydrocodone Shortness Of Breath, Other (See Comments)   "loss of consciousness"; "felt really weird" when she was given 10 mgm hydrocodone. Did well with 5 mgm tablet   Reglan [metoclopramide] Other (See Comments)   Cherry Hives      Medication List    TAKE these medications   acetaminophen 500 MG tablet Commonly known as: TYLENOL Take 1,000 mg by mouth every 6 (six) hours as needed.        Total time spent taking care of this patient: 30 minutes  Signed: Vena Austria 03/22/2020, 5:40 PM

## 2020-03-23 ENCOUNTER — Other Ambulatory Visit
Admission: RE | Admit: 2020-03-23 | Discharge: 2020-03-23 | Disposition: A | Discharge status: Home / Self Care | Payer: Medicaid Other | Source: Ambulatory Visit | Attending: Obstetrics and Gynecology | Admitting: Obstetrics and Gynecology

## 2020-03-23 DIAGNOSIS — Z01812 Encounter for preprocedural laboratory examination: Secondary | ICD-10-CM | POA: Diagnosis not present

## 2020-03-23 DIAGNOSIS — Z20822 Contact with and (suspected) exposure to covid-19: Secondary | ICD-10-CM | POA: Insufficient documentation

## 2020-03-24 LAB — SARS CORONAVIRUS 2 (TAT 6-24 HRS): SARS Coronavirus 2: NEGATIVE

## 2020-03-25 ENCOUNTER — Other Ambulatory Visit: Payer: Self-pay | Admitting: Obstetrics and Gynecology

## 2020-03-26 ENCOUNTER — Other Ambulatory Visit: Payer: Self-pay

## 2020-03-26 ENCOUNTER — Inpatient Hospital Stay
Admission: EM | Admit: 2020-03-26 | Discharge: 2020-03-27 | DRG: 807 | Disposition: A | Discharge status: Home / Self Care | Payer: Medicaid Other | Attending: Obstetrics and Gynecology | Admitting: Obstetrics and Gynecology

## 2020-03-26 ENCOUNTER — Encounter: Payer: Self-pay | Admitting: Obstetrics and Gynecology

## 2020-03-26 DIAGNOSIS — Z6834 Body mass index (BMI) 34.0-34.9, adult: Secondary | ICD-10-CM

## 2020-03-26 DIAGNOSIS — M25559 Pain in unspecified hip: Secondary | ICD-10-CM

## 2020-03-26 DIAGNOSIS — Z3A39 39 weeks gestation of pregnancy: Secondary | ICD-10-CM | POA: Diagnosis not present

## 2020-03-26 DIAGNOSIS — O26893 Other specified pregnancy related conditions, third trimester: Secondary | ICD-10-CM

## 2020-03-26 DIAGNOSIS — O0990 Supervision of high risk pregnancy, unspecified, unspecified trimester: Secondary | ICD-10-CM

## 2020-03-26 LAB — RAPID HIV SCREEN (HIV 1/2 AB+AG)
HIV 1/2 Antibodies: NONREACTIVE
HIV-1 P24 Antigen - HIV24: NONREACTIVE

## 2020-03-26 LAB — CBC
HCT: 32.8 % — ABNORMAL LOW (ref 36.0–46.0)
Hemoglobin: 11.5 g/dL — ABNORMAL LOW (ref 12.0–15.0)
MCH: 29.2 pg (ref 26.0–34.0)
MCHC: 35.1 g/dL (ref 30.0–36.0)
MCV: 83.2 fL (ref 80.0–100.0)
Platelets: 188 10*3/uL (ref 150–400)
RBC: 3.94 MIL/uL (ref 3.87–5.11)
RDW: 15.4 % (ref 11.5–15.5)
WBC: 6.8 10*3/uL (ref 4.0–10.5)
nRBC: 0 % (ref 0.0–0.2)

## 2020-03-26 LAB — TYPE AND SCREEN
ABO/RH(D): O POS
Antibody Screen: NEGATIVE

## 2020-03-26 MED ORDER — ONDANSETRON HCL 4 MG PO TABS: 4.0000 mg | ORAL_TABLET | ORAL | Status: DC | PRN

## 2020-03-26 MED ORDER — OXYTOCIN 10 UNIT/ML IJ SOLN
INTRAMUSCULAR | Status: AC
  Filled 2020-03-26: qty 2

## 2020-03-26 MED ORDER — PANTOPRAZOLE SODIUM 40 MG PO TBEC
40.0000 mg | DELAYED_RELEASE_TABLET | Freq: Every day | ORAL | Status: DC
  Administered 2020-03-26: 40 mg via ORAL
  Filled 2020-03-26: qty 1

## 2020-03-26 MED ORDER — ONDANSETRON HCL 4 MG/2ML IJ SOLN
4.0000 mg | Freq: Four times a day (QID) | INTRAMUSCULAR | Status: DC | PRN
  Administered 2020-03-26: 4 mg via INTRAVENOUS
  Filled 2020-03-26 (×2): qty 2

## 2020-03-26 MED ORDER — DIPHENHYDRAMINE HCL 25 MG PO CAPS: 25.0000 mg | ORAL_CAPSULE | Freq: Four times a day (QID) | ORAL | Status: DC | PRN

## 2020-03-26 MED ORDER — IBUPROFEN 600 MG PO TABS
600.0000 mg | ORAL_TABLET | Freq: Four times a day (QID) | ORAL | Status: DC
  Administered 2020-03-26 – 2020-03-27 (×4): 600 mg via ORAL
  Filled 2020-03-26 (×4): qty 1

## 2020-03-26 MED ORDER — DOCUSATE SODIUM 100 MG PO CAPS
100.0000 mg | ORAL_CAPSULE | Freq: Two times a day (BID) | ORAL | Status: DC
  Administered 2020-03-26 – 2020-03-27 (×2): 100 mg via ORAL
  Filled 2020-03-26 (×2): qty 1

## 2020-03-26 MED ORDER — SIMETHICONE 80 MG PO CHEW: 80.0000 mg | CHEWABLE_TABLET | ORAL | Status: DC | PRN

## 2020-03-26 MED ORDER — OXYTOCIN-SODIUM CHLORIDE 30-0.9 UT/500ML-% IV SOLN
2.5000 [IU]/h | INTRAVENOUS | Status: DC
  Filled 2020-03-26: qty 500

## 2020-03-26 MED ORDER — DIBUCAINE (PERIANAL) 1 % EX OINT: 1.0000 "application " | TOPICAL_OINTMENT | CUTANEOUS | Status: DC | PRN

## 2020-03-26 MED ORDER — ZOLPIDEM TARTRATE 5 MG PO TABS: 5.0000 mg | ORAL_TABLET | Freq: Every evening | ORAL | Status: DC | PRN

## 2020-03-26 MED ORDER — BUTORPHANOL TARTRATE 1 MG/ML IJ SOLN
2.0000 mg | INTRAMUSCULAR | Status: DC | PRN
  Administered 2020-03-26: 0.5 mg via INTRAVENOUS
  Administered 2020-03-26: 2 mg via INTRAVENOUS
  Filled 2020-03-26 (×2): qty 2

## 2020-03-26 MED ORDER — WITCH HAZEL-GLYCERIN EX PADS: 1.0000 "application " | MEDICATED_PAD | CUTANEOUS | Status: DC | PRN

## 2020-03-26 MED ORDER — BENZOCAINE-MENTHOL 20-0.5 % EX AERO
INHALATION_SPRAY | CUTANEOUS | Status: AC
  Filled 2020-03-26: qty 56

## 2020-03-26 MED ORDER — SOD CITRATE-CITRIC ACID 500-334 MG/5ML PO SOLN: 30.0000 mL | ORAL | Status: DC | PRN

## 2020-03-26 MED ORDER — MISOPROSTOL 200 MCG PO TABS
ORAL_TABLET | ORAL | Status: AC
  Filled 2020-03-26: qty 4

## 2020-03-26 MED ORDER — OXYTOCIN-SODIUM CHLORIDE 30-0.9 UT/500ML-% IV SOLN
1.0000 m[IU]/min | INTRAVENOUS | Status: DC
  Administered 2020-03-26: 2 m[IU]/min via INTRAVENOUS

## 2020-03-26 MED ORDER — AMMONIA AROMATIC IN INHA
RESPIRATORY_TRACT | Status: AC
  Filled 2020-03-26: qty 10

## 2020-03-26 MED ORDER — TERBUTALINE SULFATE 1 MG/ML IJ SOLN: 0.2500 mg | Freq: Once | INTRAMUSCULAR | Status: DC | PRN

## 2020-03-26 MED ORDER — PRENATAL MULTIVITAMIN CH
1.0000 | ORAL_TABLET | Freq: Every day | ORAL | Status: DC
  Administered 2020-03-27: 1 via ORAL
  Filled 2020-03-26: qty 1

## 2020-03-26 MED ORDER — ACETAMINOPHEN 500 MG PO TABS
1000.0000 mg | ORAL_TABLET | Freq: Four times a day (QID) | ORAL | Status: DC
  Administered 2020-03-26 – 2020-03-27 (×4): 1000 mg via ORAL
  Filled 2020-03-26 (×4): qty 2

## 2020-03-26 MED ORDER — ONDANSETRON HCL 4 MG/2ML IJ SOLN: 4.0000 mg | INTRAMUSCULAR | Status: DC | PRN

## 2020-03-26 MED ORDER — BENZOCAINE-MENTHOL 20-0.5 % EX AERO
1.0000 "application " | INHALATION_SPRAY | CUTANEOUS | Status: DC | PRN
  Administered 2020-03-26: 1 via TOPICAL
  Filled 2020-03-26: qty 56

## 2020-03-26 MED ORDER — MISOPROSTOL 25 MCG QUARTER TABLET
25.0000 ug | ORAL_TABLET | ORAL | Status: DC | PRN
  Administered 2020-03-26: 25 ug via VAGINAL
  Filled 2020-03-26: qty 1

## 2020-03-26 MED ORDER — LACTATED RINGERS IV SOLN: INTRAVENOUS | Status: DC

## 2020-03-26 MED ORDER — ACETAMINOPHEN 325 MG PO TABS: 650.0000 mg | ORAL_TABLET | ORAL | Status: DC | PRN

## 2020-03-26 MED ORDER — LIDOCAINE HCL (PF) 1 % IJ SOLN
30.0000 mL | INTRAMUSCULAR | Status: DC | PRN
  Filled 2020-03-26: qty 30

## 2020-03-26 MED ORDER — LACTATED RINGERS IV SOLN: 500.0000 mL | INTRAVENOUS | Status: DC | PRN

## 2020-03-26 MED ORDER — COCONUT OIL OIL
1.0000 "application " | TOPICAL_OIL | Status: DC | PRN
  Administered 2020-03-26: 1 via TOPICAL
  Filled 2020-03-26: qty 120

## 2020-03-26 MED ORDER — OXYTOCIN BOLUS FROM INFUSION
333.0000 mL | Freq: Once | INTRAVENOUS | Status: AC
  Administered 2020-03-26: 333 mL via INTRAVENOUS

## 2020-03-26 MED ORDER — TETANUS-DIPHTH-ACELL PERTUSSIS 5-2.5-18.5 LF-MCG/0.5 IM SUSP: 0.5000 mL | Freq: Once | INTRAMUSCULAR | Status: DC

## 2020-03-26 NOTE — Discharge Summary (Signed)
Postpartum Discharge Summary  Date of Service updated 03/27/2020     Patient Name: Anna Flores DOB: [birth year] MRN: [id number]  Date of admission: 03/26/2020 Delivery date:03/26/2020  Delivering provider: Adrian Prows R  Date of discharge: 03/27/2020  Admitting diagnosis: [redacted] weeks gestation of pregnancy [Z3A.39] Intrauterine pregnancy: [redacted]w[redacted]d    Secondary diagnosis:  Active Problems:   [redacted] weeks gestation of pregnancy  Additional problems: none    Discharge diagnosis: Term Pregnancy Delivered                                              Post partum procedures:none Augmentation: AROM, Pitocin and Cytotec Complications: None  Hospital course: Induction of Labor With Vaginal Delivery   29y.o. yo [id number] [id number] admitted to the hospital 03/26/2020 for induction of labor.  Indication for induction: Favorable cervix at term.  Patient had an uncomplicated labor course as follows: Membrane Rupture Time/Date: 5:05 PM ,03/26/2020   Delivery Method:Vaginal, Spontaneous  Episiotomy: None  Lacerations:  None  Details of delivery can be found in separate delivery note.  Patient had a routine postpartum course. Patient is discharged home 03/27/20.  Newborn Data: Birth date:03/26/2020  Birth time:7:55 PM  Gender:Female  Living status:Living  Apgars:9 ,9  Weight:3470 g   Magnesium Sulfate received: No BMZ received: No Rhophylac:No MMR:No T-DaP:declined Flu: No Transfusion:No  Physical exam  Vitals:   03/27/20 0345 03/27/20 0738 03/27/20 1118 03/27/20 1930  BP: 103/70 99/60 107/64 101/67  Pulse: 76 62 72 68  Resp: _0 Temp: 98.6 F (37 C) 97.8 F (36.6 C) 98.2 F (36.8 C) 97.6 F (36.4 C)  TempSrc: Oral Oral Oral Oral  SpO2: 98% 100%  100%  Weight:      Height:       General: alert, cooperative and no distress Lochia: appropriate Uterine Fundus: firm Incision: N/A DVT Evaluation: No evidence of DVT seen on physical exam. Labs: Lab Results   Component Value Date   WBC 7.0 03/27/2020   HGB 10.3 (L) 03/27/2020   HCT 30.4 (L) 03/27/2020   MCV 85.2 03/27/2020   PLT 177 03/27/2020   CMP Latest Ref Rng & Units 03/07/2020  Glucose 70 - 99 mg/dL 97  BUN 6 - 20 mg/dL 7  Creatinine 0.44 - 1.00 mg/dL 0.64  Sodium 135 - 145 mmol/L 136  Potassium 3.5 - 5.1 mmol/L 3.9  Chloride 98 - 111 mmol/L 103  CO2 22 - 32 mmol/L 23  Calcium 8.9 - 10.3 mg/dL 9.2  Total Protein 6.5 - 8.1 g/dL 6.2(L)  Total Bilirubin 0.3 - 1.2 mg/dL 0.7  Alkaline Phos 38 - 126 U/L 108  AST 15 - 41 U/L 16  ALT 0 - 44 U/L 11   Edinburgh Score: Edinburgh Postnatal Depression Scale Screening Tool 03/27/2020  I have been able to laugh and see the funny side of things. 0  I have looked forward with enjoyment to things. 0  I have blamed myself unnecessarily when things went wrong. 0  I have been anxious or worried for no good reason. 0  I have felt scared or panicky for no good reason. 0  Things have been getting on top of me. 0  I have been so unhappy that I have had difficulty sleeping. 0  I have felt sad or miserable. 0  I have  been so unhappy that I have been crying. 0  The thought of harming myself has occurred to me. 0  Edinburgh Postnatal Depression Scale Total 0      After visit meds:  Allergies as of 03/27/2020      Reactions   Amoxicillin Hives   Hydrocodone Shortness Of Breath, Other (See Comments)   "loss of consciousness"; "felt really weird" when she was given 10 mgm hydrocodone. Did well with 5 mgm tablet   Reglan [metoclopramide] Other (See Comments)   Cherry Hives      Medication List    TAKE these medications   acetaminophen 500 MG tablet Commonly known as: TYLENOL Take 1,000 mg by mouth every 6 (six) hours as needed.   omeprazole 20 MG capsule Commonly known as: PRILOSEC Take 20 mg by mouth daily.   oxyCODONE 5 MG immediate release tablet Commonly known as: Oxy IR/ROXICODONE Take 1 tablet (5 mg total) by mouth every 4 (four)  hours as needed for moderate pain or severe pain.        Discharge home in stable condition Infant Feeding: Breast Infant Disposition:home with mother Discharge instruction: per After Visit Summary and Postpartum booklet. Activity: Advance as tolerated. Pelvic rest for 6 weeks.  Diet: routine diet Anticipated Birth Control: Vasectomy Postpartum Appointment:2 weeks Additional Postpartum F/U: Postpartum Depression checkup and 6 weeks PP exam Future Appointments: Future Appointments  Date Time Provider [place]  05/22/2020 10:30 AM [facility] None  05/22/2020  1:45 PM Earlie Server, MD [facility] None  05/23/2020  1:30 PM [facility] INFUSION CHAIR 8 [facility] None   Follow up Visit:  Follow-up Information    Schuman, Christanna R, MD Follow up in 2 week(s).   Specialty: Obstetrics and Gynecology Contact information: [address] [place] [address] [phone]                   03/27/2020 Imagene Riches, CNM

## 2020-03-26 NOTE — H&P (Signed)
Anna Flores is an 29 y.o. female.   Chief Complaint: Desires Induction HPI: She presents today for induction secondary to maternal discomfort. She denies vaginal bleeding. She denies contractions, she denies leakage of fluid. She is having normal fetal movement.   Pregnancy #6 Problems (from 06/27/19 to present)    Problem Noted Resolved   Pregnancy related hip pain in third trimester, antepartum 02/17/2020 by [name], MD No   BMI 34.0-34.9,adult 08/18/2019 by Conard Novak, MD No   Supervision of high risk pregnancy, antepartum 05/03/2019 by Nadara Mustard, MD No   Overview Addendum 02/10/2020 11:31 AM by [name], MD    Clinic [facility] Prenatal Labs  Dating  LMP = 6wk [place] Blood type: O/Positive/-- (01/15 1008)   Genetic Screen  NIPS: normal XX Antibody:Negative (01/15 1008)  Anatomic [place] complete Rubella: 6.27 (01/15 1008)  Varicella: Immune  GTT Early: 124 Third trimester: 103 RPR: Non Reactive (01/15 1008)   Rhogam  not needed HBsAg: Negative (01/15 1008)   TDaP vaccine      Declines   Flu Shot: HIV: Non Reactive (01/15 1008)   Baby Food       Breast                         GBS:   Contraception  vasectomy Pap: NIL 2020  CBB     CS/VBAC    Support Person  [name]           Previous Version        Past Medical History:  Diagnosis Date  . Abdominal pain 10/26/2013  . Anemia 2013  . Anxiety   . Depression   . History of Papanicolaou smear of cervix 10/02/2016   NEG  . IDA (iron deficiency anemia) 02/29/2020  . Menometrorrhagia 10/26/2013  . Pregnancy    DELIVERED 08/12/16 - [facility]    Past Surgical History:  Procedure Laterality Date  . DIAGNOSTIC LAPAROSCOPY  2018   suspected ectopic pregnancy-[facility]  . TONSILLECTOMY      Family History  Adopted: Yes  Problem Relation Age of Onset  . Nevi Sister        DISPLASTIC NEVUS  . Ovarian cancer Mother   . Cirrhosis Father    Social History:  reports that she has never smoked. She has never  used smokeless tobacco. She reports that she does not drink alcohol and does not use drugs.  Allergies:  Allergies  Allergen Reactions  . Amoxicillin Hives  . Hydrocodone Shortness Of Breath and Other (See Comments)    "loss of consciousness"; "felt really weird" when she was given 10 mgm hydrocodone. Did well with 5 mgm tablet  . Reglan [Metoclopramide] Other (See Comments)  . Cherry Hives    Medications Prior to Admission  Medication Sig Dispense Refill  . acetaminophen (TYLENOL) 500 MG tablet Take 1,000 mg by mouth every 6 (six) hours as needed.    Marland Kitchen omeprazole (PRILOSEC) 20 MG capsule Take 20 mg by mouth daily.      Results for orders placed or performed during the hospital encounter of 03/26/20 (from the past 48 hour(s))  CBC     Status: Abnormal   Collection Time: 03/26/20  3:01 AM  Result Value Ref Range   WBC 6.8 4.0 - 10.5 K/uL   RBC 3.94 3.87 - 5.11 MIL/uL   Hemoglobin 11.5 (L) 12.0 - 15.0 g/dL   HCT 13.0 (L) 36 - 46 %   MCV  83.2 80.0 - 100.0 fL   MCH 29.2 26.0 - 34.0 pg   MCHC 35.1 30.0 - 36.0 g/dL   RDW 40.0 86.7 - 61.9 %   Platelets 188 150 - 400 K/uL   nRBC 0.0 0.0 - 0.2 %    Comment: Performed at [facility], [address] [place], [place] [address]  Type and screen [facility]     Status: None   Collection Time: 03/26/20  3:01 AM  Result Value Ref Range   ABO/RH(D) O POS    Antibody Screen NEG    Sample Expiration      03/29/2020,2359 Performed at [facility], [address] [place], [place] [address]   Rapid HIV screen (HIV 1/2 Ab+Ag) ([facility] Only)     Status: None   Collection Time: 03/26/20  3:02 AM  Result Value Ref Range   HIV-1 P24 Antigen - HIV24 NON REACTIVE NON REACTIVE    Comment: (NOTE) Detection of p24 may be inhibited by biotin in the sample, causing false negative results in acute infection.    HIV 1/2 Antibodies NON REACTIVE NON REACTIVE   Interpretation (HIV Ag Ab)      A non reactive test  result means that HIV 1 or HIV 2 antibodies and HIV 1 p24 antigen were not detected in the specimen.    Comment: Performed at [facility], [address] [place], [place] [address]   No results found.  Review of Systems  Constitutional: Negative for chills and fever.  HENT: Negative for congestion, hearing loss and sinus pain.   Respiratory: Negative for cough, shortness of breath and wheezing.   Cardiovascular: Negative for chest pain, palpitations and leg swelling.  Gastrointestinal: Negative for abdominal pain, constipation, diarrhea, nausea and vomiting.  Genitourinary: Negative for dysuria, flank pain, frequency, hematuria and urgency.  Musculoskeletal: Negative for back pain.  Skin: Negative for rash.  Neurological: Negative for dizziness and headaches.  Psychiatric/Behavioral: Negative for suicidal ideas. The patient is not nervous/anxious.     Blood pressure 95/63, pulse 96, temperature 97.8 F (36.6 C), temperature source Oral, resp. rate 18, height 5\' 5"  (1.651 m), weight 94.7 kg, last menstrual period 06/27/2019, unknown if currently breastfeeding. Physical Exam Vitals and nursing note reviewed.  Constitutional:      Appearance: She is well-developed.  HENT:     Head: Normocephalic and atraumatic.  Cardiovascular:     Rate and Rhythm: Normal rate and regular rhythm.  Pulmonary:     Effort: Pulmonary effort is normal.     Breath sounds: Normal breath sounds.  Abdominal:     General: Bowel sounds are normal.     Palpations: Abdomen is soft.  Musculoskeletal:        General: Normal range of motion.  Skin:    General: Skin is warm and dry.  Neurological:     Mental Status: She is alert and oriented to person, place, and time.  Psychiatric:        Behavior: Behavior normal.        Thought Content: Thought content normal.        Judgment: Judgment normal.    Assessment/Plan 29 yo 34 [redacted]w[redacted]d here for IOL secondary to maternal discomfort 1. Will begin  induction with pitocin 2. GBS negative 3. Epidural if desired 4. Clear liquid   [redacted]w[redacted]d, MD 03/26/2020, 11:48 AM

## 2020-03-26 NOTE — Progress Notes (Signed)
SVE: 5/70/-3 AROM, clear fluid No complications.  Will restart pitocin 4 hours after cytotec administration. Patient comfortable with Stadol.  Would like to avoid epidural if possible.   Adelene Idler MD, [facility], [facility] 03/26/2020 5:33 PM

## 2020-03-27 LAB — CBC
HCT: 30.4 % — ABNORMAL LOW (ref 36.0–46.0)
Hemoglobin: 10.3 g/dL — ABNORMAL LOW (ref 12.0–15.0)
MCH: 28.9 pg (ref 26.0–34.0)
MCHC: 33.9 g/dL (ref 30.0–36.0)
MCV: 85.2 fL (ref 80.0–100.0)
Platelets: 177 10*3/uL (ref 150–400)
RBC: 3.57 MIL/uL — ABNORMAL LOW (ref 3.87–5.11)
RDW: 15.9 % — ABNORMAL HIGH (ref 11.5–15.5)
WBC: 7 10*3/uL (ref 4.0–10.5)
nRBC: 0 % (ref 0.0–0.2)

## 2020-03-27 LAB — RPR: RPR Ser Ql: NONREACTIVE

## 2020-03-27 MED ORDER — OXYCODONE HCL 5 MG PO TABS
5.0000 mg | ORAL_TABLET | ORAL | Status: DC | PRN
  Administered 2020-03-27 (×3): 5 mg via ORAL
  Filled 2020-03-27 (×3): qty 1

## 2020-03-27 MED ORDER — OXYCODONE HCL 5 MG PO TABS
5.0000 mg | ORAL_TABLET | ORAL | 0 refills | Status: DC | PRN
Start: 2020-03-27 — End: 2020-12-29

## 2020-03-27 NOTE — Discharge Instructions (Signed)

## 2020-03-27 NOTE — Progress Notes (Signed)
Post Partum Day 1 Subjective: no complaints, up ad lib, voiding, tolerating PO and she has recently asked for stronger pain medication . Had Tylenol and Motrin for pain, but her afterpains are rather severe.  Objective: Blood pressure 99/60, pulse 62, temperature 97.8 F (36.6 C), temperature source Oral, resp. rate 18, height 5\' 5"  (1.651 m), weight 94.7 kg, last menstrual period 06/27/2019, SpO2 100 %, unknown if currently breastfeeding.  Physical Exam:  General: alert, cooperative, appears stated age, fatigued and no distress Lochia: appropriate Uterine Fundus: firm at U-2 Incision: healing well, no significant erythema DVT Evaluation: No evidence of DVT seen on physical exam. Negative Homan's sign. No cords or calf tenderness.  Recent Labs    03/26/20 0301 03/27/20 0727  HGB 11.5* 10.3*  HCT 32.8* 30.4*    Assessment/Plan: Plan for discharge tomorrow, Breastfeeding and Contraception vasectomy palnned, and she will contracept until that is completed.   LOS: 1 day   03/29/20 03/27/2020, 9:12 AM

## 2020-03-27 NOTE — Discharge Summary (Signed)
DC instructions and follow up reviewed with pt. Pt received paper copy of prescription to pick up at her pharmacy. Pt verbalizes understanding of all dc instructions. Pt to be dc'd home via wc with infant in arms once dc order entered into epic by pediatrician.

## 2020-04-02 ENCOUNTER — Inpatient Hospital Stay: Admit: 2020-04-02 | Payer: Self-pay

## 2020-05-13 DIAGNOSIS — R69 Illness, unspecified: Secondary | ICD-10-CM | POA: Diagnosis not present

## 2020-05-13 DIAGNOSIS — B9719 Other enterovirus as the cause of diseases classified elsewhere: Secondary | ICD-10-CM | POA: Diagnosis not present

## 2020-05-13 DIAGNOSIS — Z1152 Encounter for screening for COVID-19: Secondary | ICD-10-CM | POA: Diagnosis not present

## 2020-05-16 ENCOUNTER — Emergency Department (HOSPITAL_COMMUNITY): Payer: Medicaid Other

## 2020-05-16 ENCOUNTER — Emergency Department (HOSPITAL_COMMUNITY)
Admission: EM | Admit: 2020-05-16 | Discharge: 2020-05-16 | Disposition: A | Discharge status: Home / Self Care | Payer: Medicaid Other | Attending: Emergency Medicine | Admitting: Emergency Medicine

## 2020-05-16 ENCOUNTER — Other Ambulatory Visit: Payer: Self-pay

## 2020-05-16 ENCOUNTER — Encounter (HOSPITAL_COMMUNITY): Payer: Self-pay | Admitting: Pharmacy Technician

## 2020-05-16 DIAGNOSIS — J069 Acute upper respiratory infection, unspecified: Secondary | ICD-10-CM | POA: Insufficient documentation

## 2020-05-16 DIAGNOSIS — R059 Cough, unspecified: Secondary | ICD-10-CM | POA: Diagnosis not present

## 2020-05-16 DIAGNOSIS — Z20822 Contact with and (suspected) exposure to covid-19: Secondary | ICD-10-CM | POA: Insufficient documentation

## 2020-05-16 DIAGNOSIS — B349 Viral infection, unspecified: Secondary | ICD-10-CM | POA: Diagnosis not present

## 2020-05-16 DIAGNOSIS — R079 Chest pain, unspecified: Secondary | ICD-10-CM | POA: Diagnosis not present

## 2020-05-16 LAB — CBC
HCT: 40.9 % (ref 36.0–46.0)
Hemoglobin: 13.5 g/dL (ref 12.0–15.0)
MCH: 28.8 pg (ref 26.0–34.0)
MCHC: 33 g/dL (ref 30.0–36.0)
MCV: 87.2 fL (ref 80.0–100.0)
Platelets: 296 10*3/uL (ref 150–400)
RBC: 4.69 MIL/uL (ref 3.87–5.11)
RDW: 13.5 % (ref 11.5–15.5)
WBC: 9.1 10*3/uL (ref 4.0–10.5)
nRBC: 0 % (ref 0.0–0.2)

## 2020-05-16 LAB — BASIC METABOLIC PANEL
Anion gap: 11 (ref 5–15)
BUN: 10 mg/dL (ref 6–20)
CO2: 27 mmol/L (ref 22–32)
Calcium: 9.7 mg/dL (ref 8.9–10.3)
Chloride: 102 mmol/L (ref 98–111)
Creatinine, Ser: 0.86 mg/dL (ref 0.44–1.00)
GFR, Estimated: >60 mL/min (ref >60)
Glucose, Bld: 103 mg/dL — ABNORMAL HIGH (ref 70–99)
Potassium: 4 mmol/L (ref 3.5–5.1)
Sodium: 140 mmol/L (ref 135–145)

## 2020-05-16 LAB — I-STAT BETA HCG BLOOD, ED (MC, WL, AP ONLY): I-stat hCG, quantitative: <5 m[IU]/mL (ref <5)

## 2020-05-16 LAB — RESP PANEL BY RT PCR (RSV, FLU A&B, COVID)
Influenza A by PCR: NEGATIVE
Influenza B by PCR: NEGATIVE
Respiratory Syncytial Virus by PCR: NEGATIVE
SARS Coronavirus 2 by RT PCR: NEGATIVE

## 2020-05-16 LAB — TROPONIN I (HIGH SENSITIVITY): Troponin I (High Sensitivity): <2 ng/L (ref <18)

## 2020-05-16 MED ORDER — PREDNISONE 10 MG PO TABS: 40.0000 mg | ORAL_TABLET | Freq: Every day | ORAL | 0 refills | Status: AC

## 2020-05-16 MED ORDER — PREDNISONE 20 MG PO TABS
60.0000 mg | ORAL_TABLET | Freq: Once | ORAL | Status: AC
  Administered 2020-05-16: 60 mg via ORAL
  Filled 2020-05-16: qty 3

## 2020-05-16 MED ORDER — ACETAMINOPHEN 500 MG PO TABS
1000.0000 mg | ORAL_TABLET | Freq: Once | ORAL | Status: AC
  Administered 2020-05-16: 1000 mg via ORAL
  Filled 2020-05-16: qty 2

## 2020-05-16 NOTE — Discharge Instructions (Signed)
I am prescribing you a medication called prednisone.  This is a steroid medication that will help with the cough and inflammation in your chest.  You are going to take this for the next 5 mornings.  Please take it earlier in the day, as this medication can be stimulating and make it difficult to sleep.  I would recommend you continue taking Tylenol for management of your fevers and body aches.  I would also recommend taking a cough medication that uses the active ingredient dextromethorphan.  You can ask your pharmacist for help locating 1.  This has been proven to be safe while breast-feeding.  If you develop worsening symptoms you can always return to the ER.  It was a pleasure to meet you.

## 2020-05-16 NOTE — ED Triage Notes (Signed)
Pt with complaints of chest pain, fever, cough, body aches X5 days. Pt states everyone in her house is sick. Negative covid test a week ago. States family is in town visiting that had the delta variant a month ago.
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
## 2020-05-22 ENCOUNTER — Other Ambulatory Visit: Payer: Self-pay

## 2020-05-22 ENCOUNTER — Inpatient Hospital Stay (HOSPITAL_BASED_OUTPATIENT_CLINIC_OR_DEPARTMENT_OTHER): Payer: Medicaid Other | Admitting: Oncology

## 2020-05-22 ENCOUNTER — Inpatient Hospital Stay: Payer: Medicaid Other | Attending: Oncology

## 2020-05-22 ENCOUNTER — Encounter: Payer: Self-pay | Admitting: Oncology

## 2020-05-22 DIAGNOSIS — R5383 Other fatigue: Secondary | ICD-10-CM | POA: Insufficient documentation

## 2020-05-22 DIAGNOSIS — Z791 Long term (current) use of non-steroidal anti-inflammatories (NSAID): Secondary | ICD-10-CM | POA: Insufficient documentation

## 2020-05-22 DIAGNOSIS — Z79899 Other long term (current) drug therapy: Secondary | ICD-10-CM | POA: Diagnosis not present

## 2020-05-22 DIAGNOSIS — D580 Hereditary spherocytosis: Secondary | ICD-10-CM | POA: Diagnosis not present

## 2020-05-22 DIAGNOSIS — D508 Other iron deficiency anemias: Secondary | ICD-10-CM | POA: Diagnosis not present

## 2020-05-22 NOTE — Progress Notes (Signed)
HEMATOLOGY-ONCOLOGY TeleHEALTH VISIT PROGRESS NOTE  I connected with Anna Flores on 05/22/20 at  2:15 PM EDT by video enabled telemedicine visit and verified that I am speaking with the correct person using two identifiers. I discussed the limitations, risks, security and privacy concerns of performing an evaluation and management service by telemedicine and the availability of in-person appointments. I also discussed with the patient that there may be a patient responsible charge related to this service. The patient expressed understanding and agreed to proceed.   Other persons participating in the visit and their role in the encounter:  None  Patient's location: Home  Provider's location: office Chief Complaint: Follow-up for iron deficiency anemia   INTERVAL HISTORY Anna Flores is a 29 y.o. female who has above history reviewed by me today presents for follow up visit for management of iron deficiency anemia Problems and complaints are listed below:  Patient delivered her baby about 7 to 8 weeks ago.  She reports fatigue level has improved.  She did not do blood work prior to current visit.I attempted to connect the patient for visual enabled telehealth visit. Due to the technical difficulties with video as well as patient's preference,  Patient was transitioned to audio only visit.   Review of Systems  Constitutional: Negative for appetite change, chills, fatigue and fever.  HENT:   Negative for hearing loss and voice change.   Eyes: Negative for eye problems.  Respiratory: Negative for chest tightness and cough.   Cardiovascular: Negative for chest pain.  Gastrointestinal: Negative for abdominal distention, abdominal pain and blood in stool.  Endocrine: Negative for hot flashes.  Genitourinary: Negative for difficulty urinating and frequency.   Musculoskeletal: Negative for arthralgias.  Skin: Negative for itching and rash.  Neurological: Negative for extremity weakness.   Hematological: Negative for adenopathy.  Psychiatric/Behavioral: Negative for confusion.    Past Medical History:  Diagnosis Date  . Abdominal pain 10/26/2013  . Anemia 2013  . Anxiety   . Depression   . History of Papanicolaou smear of cervix 10/02/2016   NEG  . IDA (iron deficiency anemia) 02/29/2020  . Menometrorrhagia 10/26/2013  . Pregnancy    DELIVERED 08/12/16 - JEG   Past Surgical History:  Procedure Laterality Date  . DIAGNOSTIC LAPAROSCOPY  2018   suspected ectopic pregnancy-[facility]  . TONSILLECTOMY      Family History  Adopted: Yes  Problem Relation Age of Onset  . Nevi Sister        DISPLASTIC NEVUS  . Ovarian cancer Mother   . Cirrhosis Father     Social History   Socioeconomic History  . Marital status: Married    Spouse name: [name]   . Number of children: 3  . Years of education: 3  . Highest education level: Not on file  Occupational History  . Occupation: HOMEMAKER  Tobacco Use  . Smoking status: Never Smoker  . Smokeless tobacco: Never Used  Vaping Use  . Vaping Use: Never used  Substance and Sexual Activity  . Alcohol use: No  . Drug use: No  . Sexual activity: Yes    Birth control/protection: Surgical    Comment: Vasectomy or BTL  Other Topics Concern  . Not on file  Social History Narrative  . Not on file   Social Determinants of Health   Financial Resource Strain:   . Difficulty of Paying Living Expenses: Not on file  Food Insecurity:   . Worried About Programme researcher, broadcasting/film/video in the Last Year: Not  on file  . Ran Out of Food in the Last Year: Not on file  Transportation Needs:   . Lack of Transportation (Medical): Not on file  . Lack of Transportation (Non-Medical): Not on file  Physical Activity:   . Days of Exercise per Week: Not on file  . Minutes of Exercise per Session: Not on file  Stress:   . Feeling of Stress : Not on file  Social Connections:   . Frequency of Communication with Friends and Family: Not on file  . Frequency  of Social Gatherings with Friends and Family: Not on file  . Attends Religious Services: Not on file  . Active Member of Clubs or Organizations: Not on file  . Attends Banker Meetings: Not on file  . Marital Status: Not on file  Intimate Partner Violence:   . Fear of Current or Ex-Partner: Not on file  . Emotionally Abused: Not on file  . Physically Abused: Not on file  . Sexually Abused: Not on file    Current Outpatient Medications on File Prior to Visit  Medication Sig Dispense Refill  . acetaminophen (TYLENOL) 500 MG tablet Take 1,000 mg by mouth every 6 (six) hours as needed.    . chlorhexidine (PERIDEX) 0.12 % solution 15 mLs 2 (two) times daily.    Marland Kitchen ibuprofen (ADVIL) 400 MG tablet Take 400 mg by mouth 3 (three) times daily.    Marland Kitchen omeprazole (PRILOSEC) 20 MG capsule Take 20 mg by mouth daily.    Marland Kitchen oxyCODONE (OXY IR/ROXICODONE) 5 MG immediate release tablet Take 1 tablet (5 mg total) by mouth every 4 (four) hours as needed for moderate pain or severe pain. (Patient not taking: Reported on 05/22/2020) 30 tablet 0  . [DISCONTINUED] prochlorperazine (COMPAZINE) 10 MG tablet Take 1 tablet (10 mg total) by mouth every 6 (six) hours as needed for nausea or vomiting (headache). 90 tablet 0   No current facility-administered medications on file prior to visit.    Allergies  Allergen Reactions  . Amoxicillin Hives  . Hydrocodone Shortness Of Breath and Other (See Comments)    "loss of consciousness"; "felt really weird" when she was given 10 mgm hydrocodone. Did well with 5 mgm tablet  . Reglan [Metoclopramide] Other (See Comments)  . Cherry Hives       Observations/Objective: There were no vitals filed for this visit. There is no height or weight on file to calculate BMI.  Physical Exam  CBC    Component Value Date/Time   WBC 9.1 05/16/2020 0807   RBC 4.69 05/16/2020 0807   HGB 13.5 05/16/2020 0807   HGB 10.5 (L) 01/10/2020 1156   HCT 40.9 05/16/2020 0807   HCT  31.3 (L) 01/10/2020 1156   PLT 296 05/16/2020 0807   PLT 227 01/10/2020 1156   MCV 87.2 05/16/2020 0807   MCV 87 01/10/2020 1156   MCV 82 11/18/2014 0106   MCH 28.8 05/16/2020 0807   MCHC 33.0 05/16/2020 0807   RDW 13.5 05/16/2020 0807   RDW 13.4 01/10/2020 1156   RDW 15.7 (H) 11/18/2014 0106   LYMPHSABS 1.3 02/24/2020 1024   LYMPHSABS 1.5 01/10/2020 1156   LYMPHSABS 1.9 11/18/2014 0106   MONOABS 0.5 02/24/2020 1024   MONOABS 0.9 11/18/2014 0106   EOSABS 0.0 02/24/2020 1024   EOSABS 0.0 01/10/2020 1156   EOSABS 0.7 11/18/2014 0106   BASOSABS 0.0 02/24/2020 1024   BASOSABS 0.0 01/10/2020 1156   BASOSABS 0.1 11/18/2014 0106    CMP  Component Value Date/Time   NA 140 05/16/2020 0807   NA 137 01/30/2018 1045   NA 136 04/16/2014 1612   K 4.0 05/16/2020 0807   K 3.4 (L) 04/16/2014 1612   CL 102 05/16/2020 0807   CL 104 04/16/2014 1612   CO2 27 05/16/2020 0807   CO2 21 04/16/2014 1612   GLUCOSE 103 (H) 05/16/2020 0807   GLUCOSE 89 04/16/2014 1612   BUN 10 05/16/2020 0807   BUN 5 (L) 01/30/2018 1045   BUN 3 (L) 04/16/2014 1612   CREATININE 0.86 05/16/2020 0807   CREATININE 0.61 04/16/2014 1612   CALCIUM 9.7 05/16/2020 0807   CALCIUM 9.2 04/16/2014 1612   PROT 6.2 (L) 03/07/2020 1748   PROT 7.4 01/30/2018 1045   PROT 7.6 04/16/2014 1612   ALBUMIN 3.3 (L) 03/07/2020 1748   ALBUMIN 4.6 01/30/2018 1045   ALBUMIN 3.9 04/16/2014 1612   AST 16 03/07/2020 1748   AST 33 04/16/2014 1612   ALT 11 03/07/2020 1748   ALT 72 (H) 04/16/2014 1612   ALKPHOS 108 03/07/2020 1748   ALKPHOS 71 04/16/2014 1612   BILITOT 0.7 03/07/2020 1748   BILITOT 0.8 01/30/2018 1045   BILITOT 0.8 04/16/2014 1612   GFRNONAA >60 05/16/2020 0807   GFRNONAA >60 04/16/2014 1612   GFRAA >60 03/07/2020 1748   GFRAA >60 04/16/2014 1612     Assessment and Plan: 1. Other iron deficiency anemia     Iron deficiency anemia, Status post IV Venofer treatments previously  She did not do the blood work  prior to this visit.  No iron level is available. I reviewed her recent CBC that was done during one of her recent ER visits and hemoglobin has completely normalized. Recommend patient to continue prenatal vitamin.  No need for follow-up at this point.  I recommend patient to continue follow-up with OB/GYN.  She agrees with plan and appreciate the service.  Follow Up Instructions: Discharge from clinic.   I discussed the assessment and treatment plan with the patient. The patient was provided an opportunity to ask questions and all were answered. The patient agreed with the plan and demonstrated an understanding of the instructions.  The patient was advised to call back or seek an in-person evaluation if the symptoms worsen or if the condition fails to improve as anticipated.    Rickard Patience, MD 05/22/2020 10:06 PM

## 2020-05-22 NOTE — Progress Notes (Signed)
Patient contacted for Mychart visit. No new concerns voiced.  

## 2020-05-23 ENCOUNTER — Inpatient Hospital Stay: Payer: Medicaid Other

## 2020-06-17 DIAGNOSIS — Z20822 Contact with and (suspected) exposure to covid-19: Secondary | ICD-10-CM | POA: Diagnosis not present

## 2020-06-17 DIAGNOSIS — B349 Viral infection, unspecified: Secondary | ICD-10-CM | POA: Diagnosis not present

## 2020-12-29 ENCOUNTER — Ambulatory Visit (INDEPENDENT_AMBULATORY_CARE_PROVIDER_SITE_OTHER): Payer: Medicaid Other | Admitting: Obstetrics

## 2020-12-29 ENCOUNTER — Other Ambulatory Visit: Payer: Self-pay

## 2020-12-29 ENCOUNTER — Encounter: Payer: Self-pay | Admitting: Obstetrics

## 2020-12-29 VITALS — BP 116/72 | Ht 64.0 in | Wt 204.6 lb

## 2020-12-29 DIAGNOSIS — O9229 Other disorders of breast associated with pregnancy and the puerperium: Secondary | ICD-10-CM | POA: Diagnosis not present

## 2020-12-29 MED ORDER — MUPIROCIN CALCIUM 2 % EX CREA: 1.0000 "application " | TOPICAL_CREAM | Freq: Two times a day (BID) | CUTANEOUS | 0 refills | Status: DC

## 2020-12-29 MED ORDER — CLINDAMYCIN HCL 300 MG PO CAPS: 300.0000 mg | ORAL_CAPSULE | Freq: Two times a day (BID) | ORAL | 0 refills | Status: AC

## 2020-12-29 NOTE — Progress Notes (Signed)
Obstetrics & Gynecology Office Visit   Chief Complaint:  Chief Complaint  Patient presents with  . Gynecologic Exam    History of Present Illness: Anna Flores presents for evaluation of right breast pain that she has had for several weeks now. She is breastfeeding her 48 month old daughter. She denies any engorgement, or palpable blocked duct. The pain is noted medially on the right breast and feels like a sharp, shooting sensation. She denies any nipple tenderness, fever or body aches. She does not have a double electric breas tpump at home, but does have a single, disc style breast pump at home.   Review of Systems:  Review of Systems  Constitutional: Negative.   HENT: Negative.   Eyes: Negative.   Respiratory: Negative.   Cardiovascular: Negative.   Gastrointestinal: Negative.   Genitourinary: Negative.   Skin: Negative.   Neurological: Negative.   Endo/Heme/Allergies: Negative.   Psychiatric/Behavioral: Negative.      Past Medical History:  Past Medical History:  Diagnosis Date  . Abdominal pain 10/26/2013  . Anemia 2013  . Anxiety   . Back pain affecting pregnancy in third trimester 02/28/2020  . Depression   . History of Papanicolaou smear of cervix 10/02/2016   NEG  . IDA (iron deficiency anemia) 02/29/2020  . Indication for care in labor and delivery, antepartum 03/07/2020  . Menometrorrhagia 10/26/2013  . Pregnancy    DELIVERED 08/12/16 - JEG    Past Surgical History:  Past Surgical History:  Procedure Laterality Date  . DIAGNOSTIC LAPAROSCOPY  2018   suspected ectopic pregnancy-[facility]  . TONSILLECTOMY      Gynecologic History: No LMP recorded.  Obstetric History: [id number]  Family History:  Family History  Adopted: Yes  Problem Relation Age of Onset  . Nevi Sister        DISPLASTIC NEVUS  . Ovarian cancer Mother   . Cirrhosis Father     Social History:  Social History   Socioeconomic History  . Marital status: Married    Spouse name: [name]   .  Number of children: 3  . Years of education: 3  . Highest education level: Not on file  Occupational History  . Occupation: HOMEMAKER  Tobacco Use  . Smoking status: Never Smoker  . Smokeless tobacco: Never Used  Vaping Use  . Vaping Use: Never used  Substance and Sexual Activity  . Alcohol use: No  . Drug use: No  . Sexual activity: Yes    Birth control/protection: Surgical    Comment: Vasectomy or BTL  Other Topics Concern  . Not on file  Social History Narrative  . Not on file   Social Determinants of Health   Financial Resource Strain: Not on file  Food Insecurity: Not on file  Transportation Needs: Not on file  Physical Activity: Not on file  Stress: Not on file  Social Connections: Not on file  Intimate Partner Violence: Not on file    Allergies:  Allergies  Allergen Reactions  . Amoxicillin Hives  . Hydrocodone Shortness Of Breath and Other (See Comments)    "loss of consciousness"; "felt really weird" when she was given 10 mgm hydrocodone. Did well with 5 mgm tablet  . Reglan [Metoclopramide] Other (See Comments)  . Cherry Hives    Medications: Prior to Admission medications   Medication Sig Start Date End Date Taking? Authorizing Provider  acetaminophen (TYLENOL) 500 MG tablet Take 1,000 mg by mouth every 6 (six) hours as needed.    [provider]  chlorhexidine (PERIDEX) 0.12 % solution 15 mLs 2 (two) times daily. Patient not taking: Reported on 12/29/2020 04/18/20   [provider]  ibuprofen (ADVIL) 400 MG tablet Take 400 mg by mouth 3 (three) times daily. Patient not taking: Reported on 12/29/2020 04/18/20   [provider]  omeprazole (PRILOSEC) 20 MG capsule Take 20 mg by mouth daily. Patient not taking: Reported on 12/29/2020    [provider]  oxyCODONE (OXY IR/ROXICODONE) 5 MG immediate release tablet Take 1 tablet (5 mg total) by mouth every 4 (four) hours as needed for moderate pain or severe pain. Patient not  taking: No sig reported 03/27/20   Mirna Mires, CNM  prochlorperazine (COMPAZINE) 10 MG tablet Take 1 tablet (10 mg total) by mouth every 6 (six) hours as needed for nausea or vomiting (headache). 09/28/19 12/07/19  Conard Novak, MD    Physical Exam Vitals:  Vitals:   12/29/20 0858  BP: 116/72   No LMP recorded.  Physical Exam Constitutional:      Appearance: Normal appearance.  HENT:     Head: Normocephalic and atraumatic.     Nose: Nose normal.  Cardiovascular:     Rate and Rhythm: Normal rate and regular rhythm.     Pulses: Normal pulses.     Heart sounds: Normal heart sounds.  Abdominal:     Palpations: Abdomen is soft.  Genitourinary:    Comments: Deferred. Musculoskeletal:        General: Normal range of motion.     Cervical back: Normal range of motion and neck supple.  Skin:    General: Skin is warm and dry.  Neurological:     General: No focal deficit present.     Mental Status: She is alert and oriented to person, place, and time.  Psychiatric:        Mood and Affect: Mood normal.        Behavior: Behavior normal.    Breast exam: breasts are lactating bilaterally. They are C cup size, without nipple trauma or evidence of yeast. No palpable masses or nodules bilaterally. No engorgement or reddened areas to either breast. Noticeable sharp pain reported by the patient during gentle and deep palpation of the right breast from 3 o'clock toward the nipple.  Assessment: 30 y.o. [id number] - right breast pain ia a lactating patient. ? Nerve pain   Plan: Problem List Items Addressed This Visit   None   Visit Diagnoses    Pain of breast during breastfeeding    -  Primary   Relevant Medications   mupirocin cream (BACTROBAN) 2 %   clindamycin (CLEOCIN) 300 MG capsule    She will treat with antibiotics and apply nipple cream. We plan for follow up with an ultrasound of the breast should she not improve. Mirna Mires, CNM  12/29/2020 9:38 AM

## 2021-01-02 ENCOUNTER — Other Ambulatory Visit: Payer: Self-pay | Admitting: Obstetrics

## 2021-01-02 DIAGNOSIS — O9229 Other disorders of breast associated with pregnancy and the puerperium: Secondary | ICD-10-CM

## 2021-01-02 MED ORDER — BACITRACIN 500 UNIT/GM EX OINT: 1.0000 "application " | TOPICAL_OINTMENT | Freq: Two times a day (BID) | CUTANEOUS | 0 refills | Status: DC

## 2021-01-11 DIAGNOSIS — S92499A Other fracture of unspecified great toe, initial encounter for closed fracture: Secondary | ICD-10-CM | POA: Diagnosis not present

## 2021-01-11 DIAGNOSIS — M79672 Pain in left foot: Secondary | ICD-10-CM | POA: Diagnosis not present

## 2021-01-17 ENCOUNTER — Encounter: Payer: Self-pay | Admitting: Intensive Care

## 2021-01-17 ENCOUNTER — Ambulatory Visit
Admission: EM | Admit: 2021-01-17 | Discharge: 2021-01-17 | Disposition: A | Discharge status: Home / Self Care | Payer: Medicaid Other | Attending: Physician Assistant | Admitting: Physician Assistant

## 2021-01-17 ENCOUNTER — Emergency Department: Payer: Medicaid Other

## 2021-01-17 ENCOUNTER — Other Ambulatory Visit: Payer: Self-pay | Admitting: Obstetrics

## 2021-01-17 ENCOUNTER — Encounter: Payer: Self-pay | Admitting: Emergency Medicine

## 2021-01-17 ENCOUNTER — Emergency Department
Admission: EM | Admit: 2021-01-17 | Discharge: 2021-01-17 | Disposition: A | Discharge status: Home / Self Care | Payer: Medicaid Other | Attending: Emergency Medicine | Admitting: Emergency Medicine

## 2021-01-17 ENCOUNTER — Other Ambulatory Visit: Payer: Self-pay

## 2021-01-17 DIAGNOSIS — N611 Abscess of the breast and nipple: Secondary | ICD-10-CM | POA: Diagnosis not present

## 2021-01-17 DIAGNOSIS — U071 COVID-19: Secondary | ICD-10-CM | POA: Insufficient documentation

## 2021-01-17 DIAGNOSIS — N644 Mastodynia: Secondary | ICD-10-CM | POA: Insufficient documentation

## 2021-01-17 DIAGNOSIS — N61 Mastitis without abscess: Secondary | ICD-10-CM | POA: Diagnosis not present

## 2021-01-17 DIAGNOSIS — Z28310 Unvaccinated for covid-19: Secondary | ICD-10-CM | POA: Insufficient documentation

## 2021-01-17 DIAGNOSIS — R0981 Nasal congestion: Secondary | ICD-10-CM | POA: Diagnosis not present

## 2021-01-17 DIAGNOSIS — N6310 Unspecified lump in the right breast, unspecified quadrant: Secondary | ICD-10-CM | POA: Diagnosis not present

## 2021-01-17 DIAGNOSIS — N6001 Solitary cyst of right breast: Secondary | ICD-10-CM

## 2021-01-17 DIAGNOSIS — B370 Candidal stomatitis: Secondary | ICD-10-CM

## 2021-01-17 DIAGNOSIS — R059 Cough, unspecified: Secondary | ICD-10-CM | POA: Diagnosis not present

## 2021-01-17 DIAGNOSIS — Z880 Allergy status to penicillin: Secondary | ICD-10-CM | POA: Insufficient documentation

## 2021-01-17 LAB — CBC WITH DIFFERENTIAL/PLATELET
Abs Immature Granulocytes: 0.02 10*3/uL (ref 0.00–0.07)
Basophils Absolute: 0 10*3/uL (ref 0.0–0.1)
Basophils Relative: 0 %
Eosinophils Absolute: 0 10*3/uL (ref 0.0–0.5)
Eosinophils Relative: 0 %
HCT: 38 % (ref 36.0–46.0)
Hemoglobin: 13.4 g/dL (ref 12.0–15.0)
Immature Granulocytes: 0 %
Lymphocytes Relative: 28 %
Lymphs Abs: 1.9 10*3/uL (ref 0.7–4.0)
MCH: 30 pg (ref 26.0–34.0)
MCHC: 35.3 g/dL (ref 30.0–36.0)
MCV: 85 fL (ref 80.0–100.0)
Monocytes Absolute: 0.5 10*3/uL (ref 0.1–1.0)
Monocytes Relative: 7 %
Neutro Abs: 4.4 10*3/uL (ref 1.7–7.7)
Neutrophils Relative %: 65 %
Platelets: 251 10*3/uL (ref 150–400)
RBC: 4.47 MIL/uL (ref 3.87–5.11)
RDW: 12.2 % (ref 11.5–15.5)
WBC: 6.8 10*3/uL (ref 4.0–10.5)
nRBC: 0 % (ref 0.0–0.2)

## 2021-01-17 LAB — COMPREHENSIVE METABOLIC PANEL
ALT: 47 U/L — ABNORMAL HIGH (ref 0–44)
AST: 31 U/L (ref 15–41)
Albumin: 4.7 g/dL (ref 3.5–5.0)
Alkaline Phosphatase: 67 U/L (ref 38–126)
Anion gap: 5 (ref 5–15)
BUN: 12 mg/dL (ref 6–20)
CO2: 27 mmol/L (ref 22–32)
Calcium: 9.4 mg/dL (ref 8.9–10.3)
Chloride: 105 mmol/L (ref 98–111)
Creatinine, Ser: 0.64 mg/dL (ref 0.44–1.00)
GFR, Estimated: >60 mL/min (ref >60)
Glucose, Bld: 111 mg/dL — ABNORMAL HIGH (ref 70–99)
Potassium: 3.7 mmol/L (ref 3.5–5.1)
Sodium: 137 mmol/L (ref 135–145)
Total Bilirubin: 1 mg/dL (ref 0.3–1.2)
Total Protein: 8 g/dL (ref 6.5–8.1)

## 2021-01-17 MED ORDER — MUPIROCIN 2 % EX OINT: 1.0000 "application " | TOPICAL_OINTMENT | Freq: Two times a day (BID) | CUTANEOUS | 0 refills | Status: DC

## 2021-01-17 MED ORDER — SULFAMETHOXAZOLE-TRIMETHOPRIM 800-160 MG PO TABS: 1.0000 | ORAL_TABLET | Freq: Two times a day (BID) | ORAL | 0 refills | Status: AC

## 2021-01-17 MED ORDER — MUPIROCIN 2 % EX OINT: TOPICAL_OINTMENT | Freq: Two times a day (BID) | CUTANEOUS | Status: DC

## 2021-01-17 MED ORDER — IBUPROFEN 400 MG PO TABS
400.0000 mg | ORAL_TABLET | Freq: Once | ORAL | Status: AC
  Administered 2021-01-17: 400 mg via ORAL
  Filled 2021-01-17: qty 1

## 2021-01-17 MED ORDER — ACETAMINOPHEN 500 MG PO TABS
1000.0000 mg | ORAL_TABLET | Freq: Once | ORAL | Status: AC
  Administered 2021-01-17: 1000 mg via ORAL
  Filled 2021-01-17: qty 2
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
## 2021-01-17 NOTE — ED Notes (Signed)
See triage note  Presents with abscess area to right breast  States this has been there for about 1 month   Sent over from [facility]
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
## 2021-01-17 NOTE — ED Triage Notes (Signed)
Patient has noted abscess on right breast X1 month. Pain has worsened and area larger. Currently breastfeeding. Reports a at home positive covid test today. Symptoms of body aches, chills/sweats, sore throat, and runny nose.

## 2021-01-17 NOTE — ED Triage Notes (Signed)
Pt c/o cough, nasal congestion, sore throat. Started about 3 weeks ago, symptoms where getting better but worsened about 3 days ago.  She states her husband tested positive for covid this morning. She is also c/o right breast pain and lump. Started about a month ago but worse in the last couple of days.

## 2021-01-17 NOTE — Discharge Instructions (Addendum)
BREAST PAIN/SWELLING: I have ordered an ultrasound.  Please call [phone] to set up an appointment.  I am sending in Bactrim DS. Continue to use warm compresses and increase rest and fluids and continue to breast-feed.  Tylenol for discomfort.  Try to still contact your OB/GYN for appointment.  If your symptoms worsen before you can follow-up with your OB/GYN or get your ultrasound you should just go to the emergency department.  You have received COVID testing today either for positive exposure, concerning symptoms that could be related to COVID infection, screening purposes, or re-testing after confirmed positive.  Your test obtained today checks for active viral infection in the last 1-2 weeks. If your test is negative now, you can still test positive later. So, if you do develop symptoms you should either get re-tested and/or isolate x 5 days and then strict mask use x 5 days (unvaccinated) or mask use x 10 days (vaccinated). Please follow CDC guidelines.  While Rapid antigen tests come back in 15-20 minutes, send out PCR/molecular test results typically come back within 1-3 days. In the mean time, if you are symptomatic, assume this could be a positive test and treat/monitor yourself as if you do have COVID.   We will call with test results if positive. Please download the MyChart app and set up a profile to access test results.   If symptomatic, go home and rest. Push fluids. Take Tylenol as needed for discomfort. Gargle warm salt water. Throat lozenges. Take Mucinex DM or Robitussin for cough. Humidifier in bedroom to ease coughing. Warm showers. Also review the COVID handout for more information.  COVID-19 INFECTION: The incubation period of COVID-19 is approximately 14 days after exposure, with most symptoms developing in roughly 4-5 days. Symptoms may range in severity from mild to critically severe. Roughly 80% of those infected will have mild symptoms. People of any age may become infected  with COVID-19 and have the ability to transmit the virus. The most common symptoms include: fever, fatigue, cough, body aches, headaches, sore throat, nasal congestion, shortness of breath, nausea, vomiting, diarrhea, changes in smell and/or taste.    COURSE OF ILLNESS Some patients may begin with mild disease which can progress quickly into critical symptoms. If your symptoms are worsening please call ahead to the Emergency Department and proceed there for further treatment. Recovery time appears to be roughly 1-2 weeks for mild symptoms and 3-6 weeks for severe disease.   GO IMMEDIATELY TO ER FOR FEVER YOU ARE UNABLE TO GET DOWN WITH TYLENOL, BREATHING PROBLEMS, CHEST PAIN, FATIGUE, LETHARGY, INABILITY TO EAT OR DRINK, ETC  QUARANTINE AND ISOLATION: To help decrease the spread of COVID-19 please remain isolated if you have COVID infection or are highly suspected to have COVID infection. This means -stay home and isolate to one room in the home if you live with others. Do not share a bed or bathroom with others while ill, sanitize and wipe down all countertops and keep common areas clean and disinfected. Stay home for 5 days. If you have no symptoms or your symptoms are resolving after 5 days, you can leave your house. Continue to wear a mask around others for 5 additional days. If you have been in close contact (within 6 feet) of someone diagnosed with COVID 19, you are advised to quarantine in your home for 14 days as symptoms can develop anywhere from 2-14 days after exposure to the virus. If you develop symptoms, you  must isolate.  Most current guidelines  for COVID after exposure -unvaccinated: isolate 5 days and strict mask use x 5 days. Test on day 5 is possible -vaccinated: wear mask x 10 days if symptoms do not develop -You do not necessarily need to be tested for COVID if you have + exposure and  develop symptoms. Just isolate at home x10 days from symptom onset During this global pandemic,  CDC advises to practice social distancing, try to stay at least 57ft away from others at all times. Wear a face covering. Wash and sanitize your hands regularly and avoid going anywhere that is not necessary.  KEEP IN MIND THAT THE COVID TEST IS NOT 100% ACCURATE AND YOU SHOULD STILL DO EVERYTHING TO PREVENT POTENTIAL SPREAD OF VIRUS TO OTHERS (WEAR MASK, WEAR GLOVES, WASH HANDS AND SANITIZE REGULARLY). IF INITIAL TEST IS NEGATIVE, THIS MAY NOT MEAN YOU ARE DEFINITELY NEGATIVE. MOST ACCURATE TESTING IS DONE 5-7 DAYS AFTER EXPOSURE.   It is not advised by CDC to get re-tested after receiving a positive COVID test since you can still test positive for weeks to months after you have already cleared the virus.   *If you have not been vaccinated for COVID, I strongly suggest you consider getting vaccinated as long as there are no contraindications.

## 2021-01-18 LAB — SARS CORONAVIRUS 2 (TAT 6-24 HRS): SARS Coronavirus 2: POSITIVE — AB

## 2021-01-19 ENCOUNTER — Telehealth: Payer: Self-pay | Admitting: Lactation Services

## 2021-01-19 ENCOUNTER — Ambulatory Visit: Payer: Medicaid Other | Admitting: Obstetrics and Gynecology

## 2021-01-19 ENCOUNTER — Telehealth: Payer: Medicaid Other | Admitting: Obstetrics and Gynecology

## 2021-01-19 ENCOUNTER — Inpatient Hospital Stay
Admission: AD | Admit: 2021-01-19 | Discharge: 2021-01-21 | DRG: 776 | Disposition: A | Discharge status: Home / Self Care | Payer: Medicaid Other | Source: Ambulatory Visit | Attending: Obstetrics and Gynecology | Admitting: Obstetrics and Gynecology

## 2021-01-19 ENCOUNTER — Encounter: Payer: Self-pay | Admitting: Obstetrics and Gynecology

## 2021-01-19 DIAGNOSIS — O9279 Other disorders of lactation: Principal | ICD-10-CM | POA: Diagnosis present

## 2021-01-19 DIAGNOSIS — N61 Mastitis without abscess: Secondary | ICD-10-CM

## 2021-01-19 DIAGNOSIS — U071 COVID-19: Secondary | ICD-10-CM

## 2021-01-19 DIAGNOSIS — O9853 Other viral diseases complicating the puerperium: Secondary | ICD-10-CM | POA: Diagnosis not present

## 2021-01-19 DIAGNOSIS — O9122 Nonpurulent mastitis associated with the puerperium: Secondary | ICD-10-CM | POA: Diagnosis present

## 2021-01-19 DIAGNOSIS — O99893 Other specified diseases and conditions complicating puerperium: Secondary | ICD-10-CM

## 2021-01-19 DIAGNOSIS — O99824 Streptococcus B carrier state complicating childbirth: Secondary | ICD-10-CM | POA: Diagnosis present

## 2021-01-19 DIAGNOSIS — Z880 Allergy status to penicillin: Secondary | ICD-10-CM | POA: Diagnosis not present

## 2021-01-19 DIAGNOSIS — N611 Abscess of the breast and nipple: Secondary | ICD-10-CM | POA: Diagnosis not present

## 2021-01-19 HISTORY — DX: Mastitis without abscess: N61.0

## 2021-01-19 LAB — CBC
HCT: 37.7 % (ref 36.0–46.0)
Hemoglobin: 13.2 g/dL (ref 12.0–15.0)
MCH: 29.6 pg (ref 26.0–34.0)
MCHC: 35 g/dL (ref 30.0–36.0)
MCV: 84.5 fL (ref 80.0–100.0)
Platelets: 277 10*3/uL (ref 150–400)
RBC: 4.46 MIL/uL (ref 3.87–5.11)
RDW: 12.1 % (ref 11.5–15.5)
WBC: 6.7 10*3/uL (ref 4.0–10.5)
nRBC: 0 % (ref 0.0–0.2)

## 2021-01-19 LAB — COMPREHENSIVE METABOLIC PANEL
ALT: 36 U/L (ref 0–44)
AST: 29 U/L (ref 15–41)
Albumin: 4.9 g/dL (ref 3.5–5.0)
Alkaline Phosphatase: 68 U/L (ref 38–126)
Anion gap: 7 (ref 5–15)
BUN: 12 mg/dL (ref 6–20)
CO2: 26 mmol/L (ref 22–32)
Calcium: 9.3 mg/dL (ref 8.9–10.3)
Chloride: 102 mmol/L (ref 98–111)
Creatinine, Ser: 0.89 mg/dL (ref 0.44–1.00)
GFR, Estimated: >60 mL/min (ref >60)
Glucose, Bld: 98 mg/dL (ref 70–99)
Potassium: 3.6 mmol/L (ref 3.5–5.1)
Sodium: 135 mmol/L (ref 135–145)
Total Bilirubin: 0.8 mg/dL (ref 0.3–1.2)
Total Protein: 8.3 g/dL — ABNORMAL HIGH (ref 6.5–8.1)

## 2021-01-19 LAB — LACTIC ACID, PLASMA: Lactic Acid, Venous: 1.2 mmol/L (ref 0.5–1.9)

## 2021-01-19 MED ORDER — CLINDAMYCIN PHOSPHATE 900 MG/50ML IV SOLN
900.0000 mg | Freq: Three times a day (TID) | INTRAVENOUS | Status: DC
  Administered 2021-01-20 – 2021-01-21 (×4): 900 mg via INTRAVENOUS
  Filled 2021-01-19 (×7): qty 50

## 2021-01-19 MED ORDER — VANCOMYCIN HCL 1000 MG/200ML IV SOLN
1000.0000 mg | Freq: Two times a day (BID) | INTRAVENOUS | Status: DC
  Filled 2021-01-19: qty 200

## 2021-01-19 MED ORDER — ACETAMINOPHEN 325 MG PO TABS
650.0000 mg | ORAL_TABLET | ORAL | Status: DC | PRN
  Administered 2021-01-19 – 2021-01-20 (×5): 650 mg via ORAL
  Filled 2021-01-19 (×5): qty 2

## 2021-01-19 MED ORDER — VANCOMYCIN HCL 2000 MG/400ML IV SOLN
2000.0000 mg | Freq: Once | INTRAVENOUS | Status: DC
  Administered 2021-01-19: 2000 mg via INTRAVENOUS
  Filled 2021-01-19: qty 400

## 2021-01-19 MED ORDER — OXYCODONE HCL 5 MG PO TABS
5.0000 mg | ORAL_TABLET | ORAL | Status: DC | PRN
  Administered 2021-01-19 – 2021-01-20 (×4): 5 mg via ORAL
  Filled 2021-01-19 (×6): qty 1

## 2021-01-19 MED ORDER — DIPHENHYDRAMINE HCL 50 MG/ML IJ SOLN
25.0000 mg | Freq: Every evening | INTRAMUSCULAR | Status: DC | PRN
  Administered 2021-01-19: 25 mg via INTRAVENOUS
  Filled 2021-01-19: qty 1

## 2021-01-19 MED ORDER — HYDROMORPHONE HCL 1 MG/ML IJ SOLN: 0.2000 mg | INTRAMUSCULAR | Status: DC | PRN

## 2021-01-19 MED ORDER — DIPHENHYDRAMINE HCL 50 MG/ML IJ SOLN
INTRAMUSCULAR | Status: AC
  Filled 2021-01-19: qty 1

## 2021-01-19 MED ORDER — LACTATED RINGERS IV SOLN: INTRAVENOUS | Status: DC

## 2021-01-19 NOTE — Telephone Encounter (Addendum)
Pt has had lump in breast and plugged ducts that are very painful, started on po antibiotic yesterday, still extremely painful, taking ibuprofen and tylenol for pain, warm compresses to lumpy areas, baby will latch to this side but not for long because of cyst in area of areola, seen at ER yesterday, + covid, symptoms started Monday, June 13, no fever since Wednesday, wants to know what else she can do, has video appt with Dr. Bjorn Pippin at 1530, continue warm compresses, try to pump breasts every 2-3 hrs, pt has gotten WIC pump and has ordered larger flange but not started pumping, keep virtual appt with Dr. Bjorn Pippin

## 2021-01-19 NOTE — Progress Notes (Signed)
Patient seen overnight. She was in bed reclined with an ice pack on her neck. Her vancomycin was just discontinued and she received 25 mg of IV benadryl. Vancomycin had been running for about 20 minutes (per nursing) when the patient reported tingling and itching starting on the op of her head and spreading down her face and neck to her chest. No visible rash.   Her vital signs are stable and within normal limits. She is not febrile. Normal blood pressure.   Breast exam performed. Right breast is extremely tender to light touch. Noticeable elevation of skin at 3 o'clock close to the areola. Nipple tip inverted slightly. She reports normally with breast feeding the inversion corrects itself, but recently it has stayed inverted. No erythema. No induration or warmth to the touch. Left breast normal. She reports that she is trying to pump, but it is extremely painful with the right breast.   Discussed antibiotics with Dr. Tiburcio Pea. He had converted order to clindamycin. Seems less concerning at this time for infection as the cause of this issue, but if needed vancomycin could be restarted at a lower rate.   Chest CT pending and patient was about to go for imaging.   General Surgery contacted and they will see patient in the AM.   Consider further imaging by mammogram and breast aspiration.   Adelene Idler MD, [facility], [facility] 01/19/2021 10:55 PM

## 2021-01-19 NOTE — H&P (Signed)
Obstetrics & Gynecology History and Physical Note H&P Primary OBGYN: [facility] Primary Care Provider: Patient, No Pcp Per (Inactive)  Reason for Consultation: Right breast mastitis  History of Present Illness: Ms. Tess is a 30 y.o. [id number] (Patient's last menstrual period was 01/12/2021 (exact date).), with the above CC. She delivered 10 mos ago and has been breast feeding, some supplementation.  She has noted breast T for the last 4 weeks.  2 days ago it was worsening, and she went to Urgent Care, started on Septra for Mastitis.  Also found to be Covid Pos then.  She has sx's of chills, body aches, loss of smell and taste.  Denies fever.  No breast skin changes other than redness, no drainage.  Feels two distinct tender areas even mass.  ROS: A review of systems was performed and was complete and comprehensive, except as stated in the above HPI.  OBGYN History: As per HPI. OB History     Gravida  6   Para  5   Term  5   Preterm      AB  1   Living  5      SAB  1   IAB      Ectopic      Multiple  0   Live Births  5            Past Medical History: Past Medical History:  Diagnosis Date   Abdominal pain 10/26/2013   Anemia 2013   Anxiety    Back pain affecting pregnancy in third trimester 02/28/2020   Depression    History of Papanicolaou smear of cervix 10/02/2016   NEG   IDA (iron deficiency anemia) 02/29/2020   Indication for care in labor and delivery, antepartum 03/07/2020   Menometrorrhagia 10/26/2013   Pregnancy    DELIVERED 08/12/16 - JEG    Past Surgical History: Past Surgical History:  Procedure Laterality Date   DIAGNOSTIC LAPAROSCOPY  2018   suspected ectopic pregnancy-[facility]   TONSILLECTOMY      Family History:  Family History  Adopted: Yes  Problem Relation Age of Onset   Nevi Sister        DISPLASTIC NEVUS   Ovarian cancer Mother    Cirrhosis Father    She denies any female cancers, bleeding or blood clotting disorders.   Social  History:  Social History   Socioeconomic History   Marital status: Married    Spouse name: [name]    Number of children: 3   Years of education: 3   Highest education level: Not on file  Occupational History   Occupation: HOMEMAKER  Tobacco Use   Smoking status: Never   Smokeless tobacco: Never  Vaping Use   Vaping Use: Never used  Substance and Sexual Activity   Alcohol use: No   Drug use: No   Sexual activity: Yes    Birth control/protection: Surgical    Comment: Vasectomy or BTL  Other Topics Concern   Not on file  Social History Narrative   Not on file   Social Determinants of Health   Financial Resource Strain: Not on file  Food Insecurity: Not on file  Transportation Needs: Not on file  Physical Activity: Not on file  Stress: Not on file  Social Connections: Not on file  Intimate Partner Violence: Not on file    Allergy: Allergies  Allergen Reactions   Amoxicillin Hives   Hydrocodone Shortness Of Breath and Other (See Comments)    "loss of consciousness"; "  felt really weird" when she was given 10 mgm hydrocodone. Did well with 5 mgm tablet   Reglan [Metoclopramide] Other (See Comments)   Cherry Hives    Current Outpatient Medications: Facility-Administered Medications Prior to Admission  Medication Dose Route Frequency Provider Last Rate Last Admin   mupirocin ointment (BACTROBAN) 2 %   Nasal BID Mirna Mires, CNM       Medications Prior to Admission  Medication Sig Dispense Refill Last Dose   acetaminophen (TYLENOL) 500 MG tablet Take 1,000 mg by mouth every 6 (six) hours as needed.      bacitracin 500 UNIT/GM ointment Apply 1 application topically 2 (two) times daily. 15 g 0    sulfamethoxazole-trimethoprim (BACTRIM DS) 800-160 MG tablet Take 1 tablet by mouth 2 (two) times daily for 10 days. 20 tablet 0     Hospital Medications: Current Facility-Administered Medications  Medication Dose Route Frequency Provider Last Rate Last Admin    acetaminophen (TYLENOL) tablet 650 mg  650 mg Oral Q4H PRN Schuman, Christanna R, MD   650 mg at 01/19/21 2027   HYDROmorphone (DILAUDID) injection 0.2-0.6 mg  0.2-0.6 mg Intravenous Q2H PRN Schuman, Christanna R, MD       lactated ringers infusion   Intravenous Continuous Schuman, Christanna R, MD       oxyCODONE (Oxy IR/ROXICODONE) immediate release tablet 5-10 mg  5-10 mg Oral Q4H PRN Schuman, Christanna R, MD   5 mg at 01/19/21 2027   [START ON 01/20/2021] vancomycin (VANCOREADY) IVPB 1000 mg/200 mL  1,000 mg Intravenous Q12H Derrek Gu, RPH       vancomycin (VANCOREADY) IVPB 2000 mg/400 mL  2,000 mg Intravenous Once Derrek Gu [facility]        Physical Exam: Vitals:   01/19/21 2011  BP: 112/76  Pulse: 79  Resp: 20  Temp: 99.3 F (37.4 C)  TempSrc: Oral    Physical Exam Constitutional:      General: She is not in acute distress.    Appearance: Normal appearance. She is well-developed.  Genitourinary:  Breasts:    Right: Swelling, mass, skin change and tenderness present. No inverted nipple or nipple discharge.     Left: No inverted nipple, mass, nipple discharge, skin change or tenderness.     Breast exam comments: Mild erythema overlying UIQ and areola of right breast, very T to touch, swollen area on areola and also overlying sternum, no skin induration. Cardiovascular:     Rate and Rhythm: Normal rate and regular rhythm.     Pulses: Normal pulses.     Heart sounds: Normal heart sounds. No murmur heard.   No friction rub. No gallop.  Pulmonary:     Effort: Pulmonary effort is normal.     Breath sounds: Normal breath sounds.  Chest:     Chest wall: No mass, tenderness or edema.  Musculoskeletal:        General: Normal range of motion.  Lymphadenopathy:     Upper Body:     Right upper body: No pectoral adenopathy.     Left upper body: No pectoral adenopathy.  Neurological:     Mental Status: She is alert and oriented to person, place, and time.  Skin:     General: Skin is warm and dry.     Findings: No abrasion, bruising, erythema, lesion or rash.  Psychiatric:        Speech: Speech normal.        Behavior: Behavior normal.  Judgment: Judgment normal.  Vitals reviewed.    Recent Labs  Lab 01/17/21 1607  WBC 6.8  HGB 13.4  HCT 38.0  PLT 251   Recent Labs  Lab 01/17/21 1607  NA 137  K 3.7  CL 105  CO2 27  BUN 12  CREATININE 0.64  CALCIUM 9.4  PROT 8.0  BILITOT 1.0  ALKPHOS 67  ALT 47*  AST 31  GLUCOSE 111*   Assessment: Ms. Bergey is a 30 y.o. [id number] (Patient's last menstrual period was 01/12/2021 (exact date).) who presented to the ED with complaints of right breast pain; findings are consistent with right breast mastitis.  Plan: Will admit for IV ABX and surgery consultation Will recheck labs Plan Vancomycin based on her allergies Counseled on plan of management and need for hospitalization for even a few days Rationale for surgery discussed, and will rely on General Surgeon to help make that decision Analgesia prn  Annamarie Major, MD, [facility], [facility] 01/19/2021  8:35 PM Pager [phone]

## 2021-01-19 NOTE — Progress Notes (Signed)
Dr. Tiburcio Pea in to see Pt.

## 2021-01-19 NOTE — Consult Note (Signed)
Pharmacy Antibiotic Note  Anna Flores is a 30 y.o. female admitted on 01/19/2021 with  mastitis . Pharmacy has been consulted for vancomycin dosing.  Plan: Vancomycin 2 g IV loading dose, followed by 1g IV q12h Est AUC: 528.7 Est Cmax: 34.7 Est Cmin: 14 Calculated using SCr 0.8 and Vd of 0.5   Monitor clinical picture, renal function, and vancomycin levels before the 4th or 5th dose  F/U C&S, abx deescalation / LOT   No data recorded.  Recent Labs  Lab 01/17/21 1607  WBC 6.8  CREATININE 0.64    Estimated Creatinine Clearance: 117 mL/min (by C-G formula based on SCr of 0.64 mg/dL).    Allergies  Allergen Reactions   Amoxicillin Hives   Hydrocodone Shortness Of Breath and Other (See Comments)    "loss of consciousness"; "felt really weird" when she was given 10 mgm hydrocodone. Did well with 5 mgm tablet   Reglan [Metoclopramide] Other (See Comments)   Cherry Hives    Antimicrobials this admission: 5/17 vancomycin >>   Microbiology results: 5/17 BCx: pending   Thank you for allowing pharmacy to be a part of this patient's care.  Derrek Gu, PharmD 01/19/2021 8:19 PM

## 2021-01-19 NOTE — Progress Notes (Signed)
121/75. 73, 98.8, 20 and O2 Sat. Is 98 Percent. Pt. States she "feels the same" Alert and oriented with appropriate affect. Color good, skin w&d. Denies difficulty breathing and has not rashes.

## 2021-01-19 NOTE — Progress Notes (Addendum)
125/87, 100, 69,20 98.1 and O2 Sat. Is 100 Percent R/A. States itching and burning of head has improved.

## 2021-01-19 NOTE — Progress Notes (Signed)
Pt may have ah a reaction to Vancomycin, with sx's after onset of IV infusion (scalp itching, flushing).  See RN note who is w patient.  Will change to Clindamycin, an alternative to mastitis in PCN allergic patients.  Also welcome input from gen surgery  Annamarie Major, MD, [facility], [facility] 01/19/2021  10:10 PM

## 2021-01-19 NOTE — Progress Notes (Addendum)
C/O itching and burning of Scalp. Vancomycin stopped. VS: 98.8, 98, 118/74,20  and O2 Sat. Is 98%. Dr. Tiburcio Pea is being called by Lyn Henri RN to notify him of Pt. C/O. Marland Kitchen No rashes noted. Ears sl. Red. Dr. Tiburcio Pea stated that Vanc. Is to remain off and he will order a different antibiotic.

## 2021-01-19 NOTE — Progress Notes (Addendum)
Anna Flores is an 30 y.o. female.   Chief Complaint: right breast pain HPI: This visit was performed via video visit. Patient was at home. I was at the office. Patient tested positive for COVID 2 days ago in the ER was not able to come to the office because of her recent COVID-positive status.  She reports that she has been having mild symptoms secondary to the COVID.  Her largest concern is her right breast.  She delivered in August 2021 and is 10 months postpartum.  She is actively breast-feeding.  She reports that she has been having severe pain in her right breast for about 4 weeks.  She was seen on May 27 and started on clindamycin and a topical bactroban for suspected mastitis.  She completed this antibiotic course but her breast issues do not resolve.  She has a palpable area of pain underneath her aorta areola and at the 3 o'clock position.  She is having body aches and chills.  She was seen on June 15 at a outpatient urgent care for a breast exam.  She being told her breast looked severe enough that she should be seen in the emergency room.  She went to the emergency room and had a breast ultrasound performed.  The breast ultrasound showed areas of complex cystic and solid processes. The ER physician did not feel that the breast did not require incision and drainage.  He recommended continuing Bactrim which was started by the urgent care and following up outpatient.  She reports that since that time her breast symptoms have continued to worsen.  She is having severe pain in her right breast.  She is having difficulty expressing milk from the right breast.  She reports that her right nipple is also inverting.   Past Medical History:  Diagnosis Date   Abdominal pain 10/26/2013   Anemia 2013   Anxiety    Back pain affecting pregnancy in third trimester 02/28/2020   Depression    History of Papanicolaou smear of cervix 10/02/2016   NEG   IDA (iron deficiency anemia) 02/29/2020   Indication for care  in labor and delivery, antepartum 03/07/2020   Menometrorrhagia 10/26/2013   Pregnancy    DELIVERED 08/12/16 - [facility]    Past Surgical History:  Procedure Laterality Date   DIAGNOSTIC LAPAROSCOPY  2018   suspected ectopic pregnancy-[facility]   TONSILLECTOMY      Family History  Adopted: Yes  Problem Relation Age of Onset   Nevi Sister        DISPLASTIC NEVUS   Ovarian cancer Mother    Cirrhosis Father    Social History:  reports that she has never smoked. She has never used smokeless tobacco. She reports that she does not drink alcohol and does not use drugs.  Allergies:  Allergies  Allergen Reactions   Amoxicillin Hives   Hydrocodone Shortness Of Breath and Other (See Comments)    "loss of consciousness"; "felt really weird" when she was given 10 mgm hydrocodone. Did well with 5 mgm tablet   Reglan [Metoclopramide] Other (See Comments)   Cherry Hives    (Not in a hospital admission)   No results found for this or any previous visit (from the past 48 hour(s)). US BREAST LTD UNI RIGHT INC AXILLA  Result Date: 01/17/2021 CLINICAL DATA:  Right breast pain for 1 month with increased severity in the last 3 days. Patient is currently nursing. EXAM: ULTRASOUND OF THE right BREAST COMPARISON:  None. FINDINGS: Targeted ultrasound  is performed, showing complex cystic and solid process in the right retroareolar breast at 3 o'clock position corresponding to the palpable area pain. The lesion measures about 2.3 x 2.7 x 1.9 cm with cystic portion measuring 1.2 cm maximal diameter. Adjacent cyst measuring 2.5 cm maximal diameter. Mild color flow Doppler changes surrounding the area without internal flow. IMPRESSION: Complex cystic and solid process demonstrated in the right breast corresponding to area of palpable abnormality. RECOMMENDATION: This examination was performed on an emergent/urgent basis to answer a single clinical question and does not constitute a full diagnostic work-up. A full diagnostic  work-up at a facility that provides diagnostic breast imaging is recommended to include diagnostic mammography and/or repeat breast ultrasound as soon as possible. Electronically Signed   By: Burman Nieves M.D.   On: 01/17/2021 19:40    Review of Systems  Constitutional:  Negative for chills and fever.  HENT:  Negative for congestion, hearing loss and sinus pain.   Respiratory:  Negative for cough, shortness of breath and wheezing.   Cardiovascular:  Negative for chest pain, palpitations and leg swelling.  Gastrointestinal:  Negative for abdominal pain, constipation, diarrhea, nausea and vomiting.  Genitourinary:  Negative for dysuria, flank pain, frequency, hematuria and urgency.  Musculoskeletal:  Negative for back pain.  Skin:  Negative for rash.  Neurological:  Negative for dizziness and headaches.  Psychiatric/Behavioral:  Negative for suicidal ideas. The patient is not nervous/anxious.    Last menstrual period 01/12/2021, currently breastfeeding. Physical Exam   Physical exam not performed as visit was conducted over video.  Assessment/Plan 30 year old with suspected severe mastitis and likely breast abscess. Will admit the patient for further evaluation  Patient also needs IV antibiotics in the setting of a  prolonged infection with worsening symptoms.  Will consult general surgery for guidance regarding possible incision and drainage of the breast.  I discussed this case with the on-call general surgeon, Dr. Everlene Farrier, who is aware.  He recommended IV Zosyn and IV vancomycin.  He would also like a chest CT which I will order upon admission.   Covid- minimal symptoms per patient, no shortness of breath, monitor. Consult with hospitalist team if needed.   NPO for possible procedure in AM.   More than 20 minutes were spent face to face with the patient in the room, reviewing the medical record, labs and images, and coordinating care for the patient. The plan of management was discussed  in detail and counseling was provided.    [name], MD 01/19/2021, 4:08 PM

## 2021-01-19 NOTE — Progress Notes (Addendum)
Dr. Jerene Pitch in to see Patient. VS: 142/90, 80, 20 and O2 Saqt. Is 100 percent on R/A. I reported Patient c/o of head itching and feeling hot. Pt.m is sleepy from IV Benadryl. Dr. Jerene Pitch gave v/o not to give additional 25mg  since Pt. Has only received 25mg  thus far.

## 2021-01-19 NOTE — Progress Notes (Signed)
119/77, 72, 20, 98.6 and O2 Sat is 99 Percent on R/A. Pt. States she feels, "Better,"

## 2021-01-20 ENCOUNTER — Other Ambulatory Visit: Payer: Self-pay

## 2021-01-20 ENCOUNTER — Inpatient Hospital Stay: Payer: Medicaid Other

## 2021-01-20 ENCOUNTER — Encounter: Payer: Self-pay | Admitting: Obstetrics and Gynecology

## 2021-01-20 DIAGNOSIS — O9853 Other viral diseases complicating the puerperium: Secondary | ICD-10-CM | POA: Diagnosis not present

## 2021-01-20 DIAGNOSIS — U071 COVID-19: Secondary | ICD-10-CM | POA: Diagnosis not present

## 2021-01-20 DIAGNOSIS — O9279 Other disorders of lactation: Principal | ICD-10-CM

## 2021-01-20 DIAGNOSIS — N61 Mastitis without abscess: Secondary | ICD-10-CM | POA: Diagnosis not present

## 2021-01-20 DIAGNOSIS — O99893 Other specified diseases and conditions complicating puerperium: Secondary | ICD-10-CM | POA: Diagnosis not present

## 2021-01-20 LAB — CREATININE, SERUM
Creatinine, Ser: 1 mg/dL (ref 0.44–1.00)
GFR, Estimated: >60 mL/min (ref >60)

## 2021-01-20 LAB — LACTIC ACID, PLASMA: Lactic Acid, Venous: 1.1 mmol/L (ref 0.5–1.9)

## 2021-01-20 MED ORDER — ONDANSETRON HCL 4 MG/2ML IJ SOLN
4.0000 mg | INTRAMUSCULAR | Status: DC
  Filled 2021-01-20: qty 2

## 2021-01-20 MED ORDER — IOHEXOL 300 MG/ML  SOLN
75.0000 mL | Freq: Once | INTRAMUSCULAR | Status: AC | PRN
  Administered 2021-01-20: 75 mL via INTRAVENOUS

## 2021-01-20 MED ORDER — ONDANSETRON HCL 4 MG/2ML IJ SOLN
4.0000 mg | Freq: Four times a day (QID) | INTRAMUSCULAR | Status: DC | PRN
  Administered 2021-01-20 (×3): 4 mg via INTRAVENOUS
  Filled 2021-01-20 (×3): qty 2

## 2021-01-20 MED ORDER — SODIUM CHLORIDE 0.9 % IV SOLN
8.0000 mg | Freq: Four times a day (QID) | INTRAVENOUS | Status: DC | PRN
  Filled 2021-01-20: qty 4

## 2021-01-20 NOTE — Progress Notes (Signed)
Recalled CT and they state Transport will be up to get Pt., "shortly". Clindamycin to be started after Pt. Is back from CT>

## 2021-01-20 NOTE — Progress Notes (Signed)
Chris with Transport here to transfer Pt. To CT. Ticket To Ride provided.

## 2021-01-20 NOTE — Progress Notes (Signed)
Pt. Had stated she was having continued Nausea and Dr. Jean Rosenthal notified and he ordered an additional 4 mg of Zofran. When I took Zofran to Pt. Room for administration, she stated she was feeling better and does not want the Zofran.

## 2021-01-20 NOTE — Consult Note (Signed)
Patient ID: Anna Flores, female   DOB: 1991-01-21, 30 y.o.   MRN: [id number]  HPI Anna Flores is a 30 y.o. female seen in consultation at the request of Dr. Gaynelle Arabian.  She is 10 months out from a vaginal delivery.  She is currently breast-feeding.  She now has had breast issues for the last month or so she reports having pain in her right breast and a knot that has been present for about a month.  Pain is intermittent worsening with pressure.  It is moderate to severe intensity.  No fevers no chills.  She was recently diagnosed with COVID-19 positive test And did have some cough.  He has been on multiple rounds of antibiotics most lately being Bactrim started 2 days ago.  Did have an ultrasound 3 days ago as well as a CT scan that have both personally reviewed there is evidence of a small cystic lesion measuring 2.7 x 2.3 cm.  This is consistent with a galactocele.  On the CT scan there is no evidence of necrotizing infection or abscess.  HPI  Past Medical History:  Diagnosis Date   Abdominal pain 10/26/2013   Anemia 2013   Anxiety    Back pain affecting pregnancy in third trimester 02/28/2020   Depression    History of Papanicolaou smear of cervix 10/02/2016   NEG   IDA (iron deficiency anemia) 02/29/2020   Indication for care in labor and delivery, antepartum 03/07/2020   Menometrorrhagia 10/26/2013   Pregnancy    DELIVERED 08/12/16 - [facility]    Past Surgical History:  Procedure Laterality Date   DIAGNOSTIC LAPAROSCOPY  2018   suspected ectopic pregnancy-[facility]   TONSILLECTOMY      Family History  Adopted: Yes  Problem Relation Age of Onset   Nevi Sister        DISPLASTIC NEVUS   Ovarian cancer Mother    Cirrhosis Father     Social History Social History   Tobacco Use   Smoking status: Never   Smokeless tobacco: Never  Vaping Use   Vaping Use: Never used  Substance Use Topics   Alcohol use: No   Drug use: No    Allergies  Allergen Reactions   Amoxicillin Hives   Hydrocodone  Shortness Of Breath and Other (See Comments)    "loss of consciousness"; "felt really weird" when she was given 10 mgm hydrocodone. Did well with 5 mgm tablet   Vancomycin Itching    C/O head burning and itching   Reglan [Metoclopramide] Other (See Comments)   Cherry Hives    Current Facility-Administered Medications  Medication Dose Route Frequency Provider Last Rate Last Admin   acetaminophen (TYLENOL) tablet 650 mg  650 mg Oral Q4H PRN Schuman, Christanna R, MD   650 mg at 01/20/21 0709   clindamycin (CLEOCIN) IVPB 900 mg  900 mg Intravenous Q8H Nadara Mustard, MD 100 mL/hr at 01/20/21 0904 900 mg at 01/20/21 0904   diphenhydrAMINE (BENADRYL) injection 25 mg  25 mg Intravenous QHS PRN Nadara Mustard, MD   25 mg at 01/19/21 2218   HYDROmorphone (DILAUDID) injection 0.2-0.6 mg  0.2-0.6 mg Intravenous Q2H PRN Schuman, Christanna R, MD       lactated ringers infusion   Intravenous Continuous Schuman, Christanna R, MD 50 mL/hr at 01/20/21 0322 Infusion Verify at 01/20/21 0322   ondansetron (ZOFRAN) injection 4 mg  4 mg Intravenous Q6H PRN Nadara Mustard, MD   4 mg at 01/20/21 0901   oxyCODONE (Oxy  IR/ROXICODONE) immediate release tablet 5-10 mg  5-10 mg Oral Q4H PRN Schuman, Christanna R, MD   5 mg at 01/20/21 0901     Review of Systems Full ROS  was asked and was negative except for the information on the HPI  Physical Exam Blood pressure (!) 103/59, pulse 64, temperature 98.1 F (36.7 C), temperature source Oral, resp. rate 18, last menstrual period 01/12/2021, SpO2 97 %, currently breastfeeding. CONSTITUTIONAL: NAD EYES: Pupils are equal, round, , Sclera are non-icteric. EARS, NOSE, MOUTH AND THROAT: She is wearing a mask Hearing is intact to voice. LYMPH NODES:  Lymph nodes in the neck are normal. RESPIRATORY:  Lungs are clear. There is normal respiratory effort, with equal breath sounds bilaterally, and without pathologic use of accessory muscles. CARDIOVASCULAR: Heart is  regular without murmurs, gallops, or rubs. BREAST: There is no evidence of erythema.  On the right breast within the retroareolar area there is fluctuance and is very tender to palpation this is located from 12:00 to 3:00.  There is no evidence of necrotizing infection.  There is no evidence of any ulcerations normal axilla bilaterally GI: The abdomen is soft, nontender, and nondistended. There are no palpable masses. There is no hepatosplenomegaly. There are normal bowel sounds in all quadrants. GU: Rectal deferred.   MUSCULOSKELETAL: Normal muscle strength and tone. No cyanosis or edema.   SKIN: Turgor is good and there are no pathologic skin lesions or ulcers. NEUROLOGIC: Motor and sensation is grossly normal. Cranial nerves are grossly intact. PSYCH:  Oriented to person, place and time. Affect is normal.  Data Reviewed  I have personally reviewed the patient's imaging, laboratory findings and medical records.    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  There is no evidence of mastitis or abscess or any necrotizing infection that will mandate any surgical intervention at this time.  I have discussed with Dr. Connye Burkitt our breast radiologist and I think will be in her best interest to get ultrasound-guided aspiration of galactocele hopefully early next week.  There is some logistic issues given the fact that she tested positive for COVID 3 days ago I do think that it is reasonable to schedule this on Tuesday.  No need for emergent surgical intervention at this time. Time spent with the patient was 70 minutes, with more than 50% of the time spent in face-to-face education, counseling and care coordination.   I will be happy to see her as outpt   Sterling Big, MD FACS General Surgeon 01/20/2021, 12:46 PM

## 2021-01-20 NOTE — Progress Notes (Signed)
Daily Benign Gynecology Progress Note Anna Flores  [id number]  HD#2  Chief Complaints: right breast pain  Subjective:  Overnight Events: no acute events.  Possible reaction to vanco. Discontinued.  Doing better Complaints: right breast pain at outlined area.  Improved erythema. Nausea She denies: fevers, chills, chest pain, trouble breathing, vomiting, severe abdominal pain.  She has tolerated: minimal diet.  She reports her pain improved and well controlled on current pain meds.   She is ambulating and is voiding.  With her COVID19 symptoms she feels very tired and has a mild cough. Denies fevers. She denies trouble breathing.   Objective:  Most recent vitals Temp: 98.1 F (36.7 C)  BP: (!) 103/59  Pulse Rate: 64  Resp: 18  SpO2: 97 %   Vitals Range over 24 hours Temp  Avg: 98.4 F (36.9 C)  Min: 97.7 F (36.5 C)  Max: 99.3 F (37.4 C) BP  Min: 103/59  Max: 142/90 Pulse  Avg: 72.1  Min: 64  Max: 98 SpO2  Avg: 98.5 %  Min: 97 %  Max: 100 %   Physical Exam General: alert, well appearing, and in no distress Heart: regular rate and rhythm, no murmurs Lungs: clear to auscultation, no wheezes, rales or rhonchi, symmetric air entry Abdomen: abdomen is soft without significant tenderness, masses, organomegaly or guarding. Right breast: Area of outline is still tender to palpation with some induration.  No significant overlying erythemal.   Left breast: non-tender, no obvious palpable induration. No overlying skin changes.   Incision: n/a Extremities: no redness or tenderness in the calves or thighs, no edema  AM Labs Lab Results  Component Value Date   WBC 6.7 01/19/2021   HGB 13.2 01/19/2021   HCT 37.7 01/19/2021   PLT 277 01/19/2021   NA 135 01/19/2021   K 3.6 01/19/2021   CREATININE 1.00 01/20/2021   BUN 12 01/19/2021   INR 1.0 03/07/2020     Assessment:  Anna Flores is a 30 y.o. female HD#2 admitted with concern for mastitis and has galactocele.  CT scan shows bilateral  fluid collection. However, patient has no left breast symptoms or obvious signs on exam.  Right breast remains tender, though overlying erythema is improved.     Plan:  Pain Control:  continue PO pain medications ID: Continue IV Abx for today. Likely DC tomorrow Nutrition:  as tolerated DVT Prophylaxis:  SCDs Activity: as tolerated COVID19: symptomatic care. Mild symptoms at this point.  Anticipated Discharge: likely tomorrow.  Right breast galactocele: General surgery consult. Per Dr. Everlene Farrier, he has spoken with Dr. Annia Belt who is a breast specialist.  Will plan for outpatient aspiration on Tuesday.  Need to call to arrange on Monday.  Also per surgery, could stop abx soon.  Supportive care at this point.  All of the above discussed with the patient and all questions answered.   Thomasene Mohair, MD  01/20/2021 11:41 AM

## 2021-01-20 NOTE — Plan of Care (Signed)
Admitted to Room 333. Alert and oriented with pleasant affect. Afebrile. VSS. Oriented to Room, Safety and Security and [employer]. Education initiated. Droplet and Contact Isolation for COVID.

## 2021-01-20 NOTE — Progress Notes (Addendum)
Back to room from CT. Tolerated well. States no further itching and/or burning of head/scalp.

## 2021-01-20 NOTE — Progress Notes (Signed)
Alert and oriented with pleasant affect. VSS. Color good, skin w&d. Right Breast continues with sl. Erythema to right of anterior nipple. There is a firm and painful area to the upper right portion of the right Breasts areola. Medicated as per PRN order. Alternating warmth and cool pack. Attempting to pump since she had to stop pumping earlier due to ABO reaction. Tolerating Fairly well.

## 2021-01-20 NOTE — Progress Notes (Addendum)
Ace Bandage to right wrist as per MD order. Pt. States this makes wrist feel, ' better."
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
## 2021-01-21 DIAGNOSIS — O9853 Other viral diseases complicating the puerperium: Secondary | ICD-10-CM | POA: Diagnosis not present

## 2021-01-21 DIAGNOSIS — O99893 Other specified diseases and conditions complicating puerperium: Secondary | ICD-10-CM | POA: Diagnosis not present

## 2021-01-21 DIAGNOSIS — U071 COVID-19: Secondary | ICD-10-CM | POA: Diagnosis not present

## 2021-01-21 DIAGNOSIS — N61 Mastitis without abscess: Secondary | ICD-10-CM | POA: Diagnosis not present

## 2021-01-21 MED ORDER — ONDANSETRON 8 MG PO TBDP: 8.0000 mg | ORAL_TABLET | Freq: Three times a day (TID) | ORAL | 0 refills | Status: DC | PRN

## 2021-01-21 NOTE — Progress Notes (Signed)
Patient educated on discharge instructions, follow up appointments and medications. Patient verbalized understanding. Patient to be driven home by husband.

## 2021-01-21 NOTE — Discharge Summary (Signed)
Discharge Summary   Patient Anna Flores  MRN: [id number]  Age: 30 y.o.  DOB: 02/06/1991  Admit date: 01/19/2021  Discharge date: 01/21/2021  Principal Diagnoses:  Galactocele, postpartum  Secondary Diagnoses:  COVID-19 postpartum  Procedures performed during the hospitalization:  CT scan of chest 01/20/2021 IV antibiotics General surgery consultation   HPI: Anna Flores is a 30 y.o. [id number] (Patient's last menstrual period was 01/12/2021 (exact date).), with the above CC. She delivered 10 mos ago and has been breast feeding, some supplementation.  She has noted breast T for the last 4 weeks.  2 days ago it was worsening, and she went to Urgent Care, started on Septra for Mastitis.  Also found to be Covid Pos then.  She has sx's of chills, body aches, loss of smell and taste.  Denies fever.  No breast skin changes other than redness, no drainage.  Feels two distinct tender areas even mass.  Past Medical History:  Diagnosis Date   Abdominal pain 10/26/2013   Anemia 2013   Anxiety    Back pain affecting pregnancy in third trimester 02/28/2020   Depression    History of Papanicolaou smear of cervix 10/02/2016   NEG   IDA (iron deficiency anemia) 02/29/2020   Indication for care in labor and delivery, antepartum 03/07/2020   Menometrorrhagia 10/26/2013   Pregnancy    DELIVERED 08/12/16 - [facility]    Past Surgical History:  Procedure Laterality Date   DIAGNOSTIC LAPAROSCOPY  2018   suspected ectopic pregnancy-[facility]   TONSILLECTOMY      Allergies  Allergen Reactions   Amoxicillin Hives   Hydrocodone Shortness Of Breath and Other (See Comments)    "loss of consciousness"; "felt really weird" when she was given 10 mgm hydrocodone. Did well with 5 mgm tablet   Vancomycin Itching    C/O head burning and itching   Reglan [Metoclopramide] Other (See Comments)   Cherry Hives    Social History   Tobacco Use   Smoking status: Never   Smokeless tobacco: Never  Vaping Use   Vaping Use:  Never used  Substance Use Topics   Alcohol use: No   Drug use: No    Family History  Adopted: Yes  Problem Relation Age of Onset   Nevi Sister        DISPLASTIC NEVUS   Ovarian cancer Mother    Cirrhosis Father     Hospital Course:  She was admitted and started on IV vancomycin.  She had a mild reaction to this medication. So, this was changed to IV clindamycin. She had a CT scan early HD#2. She had a general surgery consult to assess for drainage.  Given the suspicion was for a galactocele instead of an abscess, fine needle aspiration was decided on as the better course of drainage. Given her COVID19 status, interventional radiology requested that her drainage be performed as an outpatient. She was monitored overnight and continued to improve with pumping.  She did have an incident with the bedside table falling on her right wrist.  This was painful to her and she iced the area and wrapped the area in a bandage.  The area never became swollen or with bruising. She remained afebrile throughout her hospitalization. She was diagnosed with COVID19.  She denied trouble breathing and only had mild symptoms.  She did have some nausea for which she was treated with IV zofran.  She was discharged in improved condition.    Discharge Exam: BP 105/70 (BP Location: Right  Arm)   Pulse 67   Temp 98 F (36.7 C) (Oral)   Resp 18   LMP 01/12/2021 (Exact Date)   SpO2 97%  Physical Exam Constitutional:      General: She is not in acute distress.    Appearance: Normal appearance. She is well-developed.  Genitourinary:  Breasts:    Right: Inverted nipple, mass and tenderness (see outline) present. No swelling, bleeding, nipple discharge, skin change or breast implant.     Left: Inverted nipple present. No swelling, bleeding, mass, nipple discharge, tenderness or breast implant.  HENT:     Head: Normocephalic and atraumatic.  Eyes:     General: No scleral icterus.    Conjunctiva/sclera: Conjunctivae  normal.  Cardiovascular:     Rate and Rhythm: Normal rate and regular rhythm.     Heart sounds: No murmur heard.   No friction rub. No gallop.  Pulmonary:     Effort: Pulmonary effort is normal. No respiratory distress.     Breath sounds: Normal breath sounds. No wheezing or rales.  Chest:    Abdominal:     General: Bowel sounds are normal. There is no distension.     Palpations: Abdomen is soft. There is no mass.     Tenderness: There is no abdominal tenderness. There is no guarding or rebound.  Musculoskeletal:        General: Normal range of motion.     Right wrist: Tenderness present. No swelling, deformity, effusion, lacerations, bony tenderness or snuff box tenderness. Normal range of motion.     Left wrist: No swelling, deformity, effusion, lacerations, tenderness, bony tenderness or snuff box tenderness. Normal range of motion.     Cervical back: Normal range of motion and neck supple.  Neurological:     General: No focal deficit present.     Mental Status: She is alert and oriented to person, place, and time.     Cranial Nerves: No cranial nerve deficit.  Skin:    General: Skin is warm and dry.     Findings: No erythema.  Psychiatric:        Mood and Affect: Mood normal.        Behavior: Behavior normal.        Judgment: Judgment normal.     Condition at Discharge: Stable  Complications affecting treatment: None  Discharge Medications:  Allergies as of 01/21/2021       Reactions   Amoxicillin Hives   Hydrocodone Shortness Of Breath, Other (See Comments)   "loss of consciousness"; "felt really weird" when she was given 10 mgm hydrocodone. Did well with 5 mgm tablet   Vancomycin Itching   C/O head burning and itching   Reglan [metoclopramide] Other (See Comments)   Cherry Hives        Medication List     TAKE these medications    acetaminophen 500 MG tablet Commonly known as: TYLENOL Take 1,000 mg by mouth every 6 (six) hours as needed.   bacitracin  500 UNIT/GM ointment Apply 1 application topically 2 (two) times daily.   ondansetron 8 MG disintegrating tablet Commonly known as: Zofran ODT Take 1 tablet (8 mg total) by mouth every 8 (eight) hours as needed for nausea or vomiting.   sulfamethoxazole-trimethoprim 800-160 MG tablet Commonly known as: BACTRIM DS Take 1 tablet by mouth 2 (two) times daily for 10 days.         Follow-up arrangements:   Follow-up Information     Pabon, Merri Ray, MD  Follow up in 2 week(s).   Specialty: General Surgery Contact information: [address] [place] [address] [phone]                  Discharge Disposition: Discharge disposition: 01-Home or Self Care    Signed: Thomasene Mohair, MD  01/21/2021 9:24 AM

## 2021-01-21 NOTE — Progress Notes (Signed)
Pt. Stated she is having right wrist pain since appx. 1400 today. Pt. States she attempting to raise bedside table and that the bedside table top came down and hit her wrist. She has complete ROM, there is no visible bruising, edema or trauma. She does c/o pain with flexion. Pt. States she has been putting ice on her wrist since appx. 1400.  Pt. Was medicated with Tylenol as per PRN order. Dr. Jean Rosenthal and ordered Ace Bandage for Patient. I instructed Pt. To notify me if she had increasing pain and/or decreased mobility of right wrist and she v/o.

## 2021-01-21 NOTE — Progress Notes (Signed)
Pt. Has not had any further c/o wrist pain since Ace Bandage applied. She has appeared to be using right hand to preform activities of Self Care.

## 2021-01-22 ENCOUNTER — Ambulatory Visit: Payer: Medicaid Other | Admitting: Obstetrics

## 2021-01-24 LAB — CULTURE, BLOOD (ROUTINE X 2)
Culture: NO GROWTH
Culture: NO GROWTH
Special Requests: ADEQUATE
Special Requests: ADEQUATE

## 2021-01-26 ENCOUNTER — Telehealth: Payer: Self-pay

## 2021-01-26 NOTE — Telephone Encounter (Signed)
Spoke with Melissa and she will send message to [employer] as well letting her know patient would like this scheduled as soon as possible.  Message sent to [facility].

## 2021-01-29 ENCOUNTER — Encounter: Payer: Self-pay | Admitting: Surgery

## 2021-01-29 ENCOUNTER — Other Ambulatory Visit: Payer: Self-pay

## 2021-01-29 ENCOUNTER — Telehealth: Payer: Self-pay

## 2021-01-29 ENCOUNTER — Ambulatory Visit (INDEPENDENT_AMBULATORY_CARE_PROVIDER_SITE_OTHER): Payer: Medicaid Other | Admitting: Surgery

## 2021-01-29 VITALS — BP 136/81 | HR 83 | Temp 98.3°F | Ht 65.0 in | Wt 200.2 lb

## 2021-01-29 DIAGNOSIS — N6002 Solitary cyst of left breast: Secondary | ICD-10-CM | POA: Diagnosis not present

## 2021-01-29 DIAGNOSIS — B9689 Other specified bacterial agents as the cause of diseases classified elsewhere: Secondary | ICD-10-CM

## 2021-01-29 DIAGNOSIS — N644 Mastodynia: Secondary | ICD-10-CM | POA: Diagnosis not present

## 2021-01-29 DIAGNOSIS — N6489 Other specified disorders of breast: Secondary | ICD-10-CM | POA: Diagnosis not present

## 2021-01-29 NOTE — Telephone Encounter (Signed)
Spoke with patient -she is in pain-she is requesting to have the  Right Breast aspiration--today-spoke with Dr.Pabon -ok to schedule patient today for breast aspiration.

## 2021-01-29 NOTE — Patient Instructions (Signed)
Apply warm compresses and massage daily.  If you have any concerns or questions, please feel free to call our office.

## 2021-01-29 NOTE — Telephone Encounter (Signed)
Left message for patient to return my call.

## 2021-01-30 ENCOUNTER — Encounter: Payer: Self-pay | Admitting: Surgery

## 2021-01-30 ENCOUNTER — Telehealth: Payer: Self-pay | Admitting: Surgery

## 2021-01-30 NOTE — Telephone Encounter (Signed)
Incoming call from the patient.  She had breast aspiration done on 01/29/21 with Dr. Everlene Farrier.  She states that she is now having a lot more pain with redness and swelling in the area.  No fever or chills,no nausea or vomiting.  Please call her.  Thank you.

## 2021-01-30 NOTE — Telephone Encounter (Signed)
Patient states her breast is having more pain and some redness- I let her know this could be from where the aspiration was done and that it is inflamed. Not unusual from the procedure. Denies fever. Apply ice, continue the ibuprofen and Tylenol. Continue to try and pump milk from that breast. Call the office tomorrow if symptoms worsen.

## 2021-01-30 NOTE — Progress Notes (Signed)
Outpatient Surgical Follow Up  01/30/2021  Anna Flores is an 30 y.o. female.   Chief Complaint  Patient presents with   New Patient (Initial Visit)    Right breast aspiration    HPI: 30 y.o. female seen in consultation at the request of Dr. Gaynelle Arabian.  She is 10 months out from a vaginal delivery.  She is currently breast-feeding.  She now has had breast issues for the last month or so she reports having pain in her right breast and a knot that has been present for about a month.  Pain is intermittent worsening with pressure.  It is moderate to severe intensity.  No fevers no chills.  She was recently diagnosed w covid 2 weeks ago.  She was recently admitted for right galactocele. CT pers. Reviewed no evidence of abscess, U/S pers reviewed showing complex cystic lesion on  right breast. She continue to have significant pain. SHe is very anxious  Past Medical History:  Diagnosis Date   Abdominal pain 10/26/2013   Anemia 2013   Anxiety    Back pain affecting pregnancy in third trimester 02/28/2020   Depression    History of Papanicolaou smear of cervix 10/02/2016   NEG   IDA (iron deficiency anemia) 02/29/2020   Indication for care in labor and delivery, antepartum 03/07/2020   Menometrorrhagia 10/26/2013   Pregnancy    DELIVERED 08/12/16 - [facility]    Past Surgical History:  Procedure Laterality Date   DIAGNOSTIC LAPAROSCOPY  2018   suspected ectopic pregnancy-[facility]   TONSILLECTOMY      Family History  Adopted: Yes  Problem Relation Age of Onset   Nevi Sister        DISPLASTIC NEVUS   Ovarian cancer Mother    Cirrhosis Father     Social History:  reports that she has never smoked. She has never used smokeless tobacco. She reports that she does not drink alcohol and does not use drugs.  Allergies:  Allergies  Allergen Reactions   Amoxicillin Hives   Hydrocodone Shortness Of Breath and Other (See Comments)    "loss of consciousness"; "felt really weird" when she was given 10 mgm  hydrocodone. Did well with 5 mgm tablet   Vancomycin Itching    C/O head burning and itching   Reglan [Metoclopramide] Other (See Comments)   Cherry Hives    Medications reviewed.    ROS Full ROS performed and is otherwise negative other than what is stated in HPI   BP 136/81   Pulse 83   Temp 98.3 F (36.8 C) (Oral)   Ht 5\' 5"  (1.651 m)   Wt 200 lb 3.2 oz (90.8 kg)   LMP 01/12/2021 (Exact Date)   SpO2 98%   BMI 33.32 kg/m   Physical Exam Vitals and nursing note reviewed. Exam conducted with a chaperone present.  Constitutional:      Appearance: Normal appearance. She is normal weight.  Pulmonary:     Effort: Pulmonary effort is normal.     Breath sounds: No stridor.     Comments: BREAST: 3 CM cystic lesion located 2-3 o'clock. Very tender d/w galactocele. Inverted right nipple. No abscess. No True masses. Left breast is completely nml. No evidence of axillary LAD. Abdominal:     General: Abdomen is flat. There is no distension.     Palpations: Abdomen is soft. There is no mass.     Tenderness: There is no abdominal tenderness.     Hernia: No hernia is present.  Musculoskeletal:     Cervical back: Normal range of motion and neck supple. No rigidity or tenderness.  Skin:    General: Skin is warm and dry.     Capillary Refill: Capillary refill takes less than 2 seconds.  Neurological:     General: No focal deficit present.     Mental Status: She is alert and oriented to person, place, and time.  Psychiatric:        Mood and Affect: Mood normal.        Behavior: Behavior normal.        Thought Content: Thought content normal.        Judgment: Judgment normal.     Assessment/Plan: 30 right galactocele w severe sxs. UNable to accommodate aspiration at [facility]. I discussed with her in detail about options and about the option of aspiration here in the office. SHe wishes to proceed.  She was very nervous.  I had to do some cognitive therapy and supportive  therapy for her to feel comfortable with me drain the abscess.  She was very Adult nurse. Greater than 50% of the 40 minutes  visit was spent in counseling/coordination of care     Procedure note 1. U/S guided needle aspiration of  right breast galactocele  ANesthesia: 5 cc lidocaine 1% w epi   EBL: minimal  Findings: 4 cc milky fluid from galactocele sent for culture  Patient was explained about the procedure in detail risks, benefits and possible complications including but not limited to: Recurrence, pain and infection.  A consent was obtained.  Patient was prepped and draped in the usual sterile fashion.  Using the ultrasound probe we located and visualized a complex cystic lesion with some solid components.  This was located at 3:00 in the retroareolar area.  Using a 22-gauge needle we were able to aspirate the contents of this cystic lesion.  About 4 cc of milky fluid was aspirated.  Please note that this was not pus and it was not mild quadrants.  This was sent for culture and sensitivities.  Patient tolerated procedure well.  No complications   Sterling Big, MD [facility] General Surgeon

## 2021-01-31 DIAGNOSIS — N61 Mastitis without abscess: Secondary | ICD-10-CM | POA: Diagnosis not present

## 2021-01-31 DIAGNOSIS — R509 Fever, unspecified: Secondary | ICD-10-CM | POA: Diagnosis not present

## 2021-01-31 DIAGNOSIS — N6459 Other signs and symptoms in breast: Secondary | ICD-10-CM | POA: Diagnosis not present

## 2021-01-31 DIAGNOSIS — N644 Mastodynia: Secondary | ICD-10-CM | POA: Diagnosis not present

## 2021-01-31 DIAGNOSIS — M791 Myalgia, unspecified site: Secondary | ICD-10-CM | POA: Diagnosis not present

## 2021-02-01 ENCOUNTER — Ambulatory Visit (INDEPENDENT_AMBULATORY_CARE_PROVIDER_SITE_OTHER): Payer: Medicaid Other | Admitting: Obstetrics

## 2021-02-01 ENCOUNTER — Other Ambulatory Visit: Payer: Self-pay

## 2021-02-01 ENCOUNTER — Telehealth: Payer: Self-pay | Admitting: *Deleted

## 2021-02-01 VITALS — BP 115/72 | Ht 65.0 in | Wt 201.2 lb

## 2021-02-01 DIAGNOSIS — N61 Mastitis without abscess: Secondary | ICD-10-CM

## 2021-02-01 DIAGNOSIS — O9279 Other disorders of lactation: Secondary | ICD-10-CM | POA: Diagnosis not present

## 2021-02-01 DIAGNOSIS — N644 Mastodynia: Secondary | ICD-10-CM | POA: Diagnosis not present

## 2021-02-01 DIAGNOSIS — F411 Generalized anxiety disorder: Secondary | ICD-10-CM | POA: Diagnosis not present

## 2021-02-01 HISTORY — DX: Other disorders of lactation: O92.79

## 2021-02-01 MED ORDER — OXYCODONE HCL 5 MG PO TABS: 5.0000 mg | ORAL_TABLET | ORAL | 0 refills | Status: DC | PRN

## 2021-02-01 MED ORDER — ESCITALOPRAM OXALATE 10 MG PO TABS: 10.0000 mg | ORAL_TABLET | Freq: Every day | ORAL | 3 refills | Status: DC

## 2021-02-01 NOTE — Telephone Encounter (Signed)
Patient spoke with Anna Flores on 01/30/21 and was told to call back the next day if no improvement or her symptoms worsened. She did not call on 01/31/21 and instead went to [facility] ER and was seen and placed on Doxycycline. I sent a message to Dr Everlene Farrier and the patient may come in tomorrow to be seen by Lynden Oxford PA-C or we can schedule her to come in on Wednesday to see Dr Everlene Farrier as Monday is a holiday.  Spoke with the patient and she would like to come in tomorrow to see Ian Malkin. She is scheduled for 11:15 am.

## 2021-02-01 NOTE — Telephone Encounter (Signed)
Patient called and stated that she spoke with sheila yesterday in regards to her symptoms and was suppose to call back today if they get worse. Patient went to the ER at [facility] yesterday because her pain, redness and swelling was getting worse. They did say she has Cellulitis Infection and gave her doxycycline. Patient was also told that she needs to see her surgeon. Please call and advise

## 2021-02-01 NOTE — Progress Notes (Signed)
Obstetrics & Gynecology Office Visit   Chief Complaint:  Chief Complaint  Patient presents with   Gynecologic Exam    History of Present Illness: Anna Flores presents  as a follow up from recent treatment of a breast galactocele. She was treated by the [facility] several weeks ago with mastitis that developed into a galactocele. She shares that she was then referred to a surgeon who aspirated the galactocele, and most recently developed a painful , red, area over most of the right breast. Unable to be seen by the surgeon, she , with urgency , presented to [facility] center,and was now diagnosed with cellulitis.She is now on doxycycline and is treating the edema with ice . She was not given any pain medication and shares that the pain is extreme.   She decided to keep today's appointment , even though her treatment had already been redirected to a specialist.   Review of Systems:  Review of Systems  Constitutional: Negative.   HENT: Negative.    Eyes: Negative.   Respiratory: Negative.    Cardiovascular: Negative.   Gastrointestinal: Negative.   Genitourinary: Negative.   Musculoskeletal: Negative.   Skin:        Cellulitis of right breast , covering most of the breast tissue.  Neurological: Negative.   Endo/Heme/Allergies: Negative.   Psychiatric/Behavioral:  The patient is nervous/anxious.   GAD score is14 PHQ score 13  Past Medical History:  Past Medical History:  Diagnosis Date   Abdominal pain 10/26/2013   Anemia 2013   Anxiety    Back pain affecting pregnancy in third trimester 02/28/2020   Depression    History of Papanicolaou smear of cervix 10/02/2016   NEG   IDA (iron deficiency anemia) 02/29/2020   Indication for care in labor and delivery, antepartum 03/07/2020   Menometrorrhagia 10/26/2013   Pregnancy    DELIVERED 08/12/16 - [facility]    Past Surgical History:  Past Surgical History:  Procedure Laterality Date   DIAGNOSTIC LAPAROSCOPY  2018   suspected  ectopic pregnancy-[facility]   TONSILLECTOMY      Gynecologic History: Patient's last menstrual period was 01/12/2021 (exact date).  Obstetric History: [id number]  Family History:  Family History  Adopted: Yes  Problem Relation Age of Onset   Nevi Sister        DISPLASTIC NEVUS   Ovarian cancer Mother    Cirrhosis Father     Social History:  Social History   Socioeconomic History   Marital status: Married    Spouse name: Anna Flores    Number of children: 3   Years of education: 3   Highest education level: Not on file  Occupational History   Occupation: HOMEMAKER  Tobacco Use   Smoking status: Never   Smokeless tobacco: Never  Vaping Use   Vaping Use: Never used  Substance and Sexual Activity   Alcohol use: No   Drug use: No   Sexual activity: Yes    Birth control/protection: Surgical    Comment: Vasectomy or BTL  Other Topics Concern   Not on file  Social History Narrative   Not on file   Social Determinants of Health   Financial Resource Strain: Not on file  Food Insecurity: Not on file  Transportation Needs: Not on file  Physical Activity: Not on file  Stress: Not on file  Social Connections: Not on file  Intimate Partner Violence: Not on file    Allergies:  Allergies  Allergen Reactions   Amoxicillin  Hives   Hydrocodone Shortness Of Breath and Other (See Comments)    "loss of consciousness"; "felt really weird" when she was given 10 mgm hydrocodone. Did well with 5 mgm tablet   Vancomycin Itching    C/O head burning and itching   Reglan [Metoclopramide] Other (See Comments)   Cherry Hives    Medications: Prior to Admission medications   Medication Sig Start Date End Date Taking? Authorizing Provider  acetaminophen (TYLENOL) 500 MG tablet Take 1,000 mg by mouth every 6 (six) hours as needed.   Yes [provider]  doxycycline (VIBRAMYCIN) 100 MG capsule Take by mouth. 01/31/21 02/10/21 Yes [provider]  escitalopram (LEXAPRO) 10 MG  tablet Take 1 tablet (10 mg total) by mouth daily. 02/01/21  Yes Mirna Mires, CNM  oxyCODONE (ROXICODONE) 5 MG immediate release tablet Take 1 tablet (5 mg total) by mouth every 4 (four) hours as needed for severe pain. 02/01/21  Yes Mirna Mires, CNM  prochlorperazine (COMPAZINE) 10 MG tablet Take 1 tablet (10 mg total) by mouth every 6 (six) hours as needed for nausea or vomiting (headache). 09/28/19 12/07/19  Conard Novak, MD    Physical Exam Vitals:  Vitals:   02/01/21 1447  BP: 115/72   Patient's last menstrual period was 01/12/2021 (exact date).  Physical Exam Constitutional:      Appearance: Normal appearance. She is normal weight.  HENT:     Head: Normocephalic and atraumatic.     Nose: Nose normal.  Cardiovascular:     Rate and Rhythm: Normal rate and regular rhythm.     Pulses: Normal pulses.     Heart sounds: Normal heart sounds.  Pulmonary:     Effort: Pulmonary effort is normal.     Breath sounds: Normal breath sounds.  Abdominal:     Palpations: Abdomen is soft.  Musculoskeletal:     Cervical back: Normal range of motion and neck supple.  Skin:    General: Skin is warm and dry.     Comments: Right breast is red over most of the tissue, with notable edama on the areola and nipple that apperas yellowish. The skin appears similar to "peau d'orange"  Neurological:     General: No focal deficit present.     Mental Status: She is alert and oriented to person, place, and time.  Psychiatric:     Comments: See PHQ snd GAD scores. She is anxious today     Assessment: 30 y.o. [id number] No problem-specific Assessment & Plan notes found for this encounter.   Plan: Problem List Items Addressed This Visit       Other   Postpartum galactocele   GAD (generalized anxiety disorder) - Primary   Relevant Medications   escitalopram (LEXAPRO) 10 MG tablet   Other Visit Diagnoses     Pain of right breast       Relevant Medications   oxyCODONE (ROXICODONE) 5  MG immediate release tablet   Cellulitis of right breast          Much consolation provided to [facility] and her challenges at getting care for her symptoms and treatment frustrations. A prescription for limite Roxicodone is previded.  We will also start her on some Lexapro, after discussing her ongoing GAD symptoms. Review of SSRI medications reviewed. She will start on 10 mg daily, and f/u in 4 weeks.  Stressed the import of getting in with the surgeon to have her cellulitis followed up on .  Mirna Mires,  CNM  02/01/2021 6:32 PM

## 2021-02-02 ENCOUNTER — Other Ambulatory Visit: Payer: Self-pay

## 2021-02-02 ENCOUNTER — Encounter: Payer: Self-pay | Admitting: Physician Assistant

## 2021-02-02 ENCOUNTER — Emergency Department: Payer: Medicaid Other

## 2021-02-02 ENCOUNTER — Ambulatory Visit (INDEPENDENT_AMBULATORY_CARE_PROVIDER_SITE_OTHER): Payer: Medicaid Other | Admitting: Surgery

## 2021-02-02 VITALS — BP 138/81 | HR 93 | Temp 98.4°F | Wt 198.6 lb

## 2021-02-02 DIAGNOSIS — F411 Generalized anxiety disorder: Secondary | ICD-10-CM | POA: Diagnosis not present

## 2021-02-02 DIAGNOSIS — N611 Abscess of the breast and nipple: Secondary | ICD-10-CM | POA: Diagnosis not present

## 2021-02-02 DIAGNOSIS — O9279 Other disorders of lactation: Secondary | ICD-10-CM | POA: Diagnosis not present

## 2021-02-02 DIAGNOSIS — R079 Chest pain, unspecified: Secondary | ICD-10-CM | POA: Diagnosis not present

## 2021-02-02 DIAGNOSIS — O9112 Abscess of breast associated with the puerperium: Principal | ICD-10-CM | POA: Diagnosis present

## 2021-02-02 LAB — CBC WITH DIFFERENTIAL/PLATELET
Abs Immature Granulocytes: 0.04 K/uL (ref 0.00–0.07)
Basophils Absolute: 0 K/uL (ref 0.0–0.1)
Basophils Relative: 0 %
Eosinophils Absolute: 0.1 K/uL (ref 0.0–0.5)
Eosinophils Relative: 1 %
HCT: 35.2 % — ABNORMAL LOW (ref 36.0–46.0)
Hemoglobin: 11.8 g/dL — ABNORMAL LOW (ref 12.0–15.0)
Immature Granulocytes: 0 %
Lymphocytes Relative: 23 %
Lymphs Abs: 2.6 K/uL (ref 0.7–4.0)
MCH: 29.2 pg (ref 26.0–34.0)
MCHC: 33.5 g/dL (ref 30.0–36.0)
MCV: 87.1 fL (ref 80.0–100.0)
Monocytes Absolute: 0.9 K/uL (ref 0.1–1.0)
Monocytes Relative: 8 %
Neutro Abs: 8 K/uL — ABNORMAL HIGH (ref 1.7–7.7)
Neutrophils Relative %: 68 %
Platelets: 341 K/uL (ref 150–400)
RBC: 4.04 MIL/uL (ref 3.87–5.11)
RDW: 12.2 % (ref 11.5–15.5)
WBC: 11.6 K/uL — ABNORMAL HIGH (ref 4.0–10.5)
nRBC: 0 % (ref 0.0–0.2)

## 2021-02-02 LAB — LACTIC ACID, PLASMA: Lactic Acid, Venous: 1.2 mmol/L (ref 0.5–1.9)

## 2021-02-02 LAB — BASIC METABOLIC PANEL WITH GFR
Anion gap: 11 (ref 5–15)
BUN: 9 mg/dL (ref 6–20)
CO2: 25 mmol/L (ref 22–32)
Calcium: 9.5 mg/dL (ref 8.9–10.3)
Chloride: 102 mmol/L (ref 98–111)
Creatinine, Ser: 0.7 mg/dL (ref 0.44–1.00)
GFR, Estimated: >60 mL/min
Glucose, Bld: 110 mg/dL — ABNORMAL HIGH (ref 70–99)
Potassium: 3.6 mmol/L (ref 3.5–5.1)
Sodium: 138 mmol/L (ref 135–145)

## 2021-02-02 LAB — TROPONIN I (HIGH SENSITIVITY): Troponin I (High Sensitivity): <2 ng/L (ref <18)

## 2021-02-02 MED ORDER — ALPRAZOLAM 0.5 MG PO TABS: 0.5000 mg | ORAL_TABLET | Freq: Three times a day (TID) | ORAL | 0 refills | Status: DC | PRN

## 2021-02-02 MED ORDER — GABAPENTIN 300 MG PO CAPS: 300.0000 mg | ORAL_CAPSULE | Freq: Three times a day (TID) | ORAL | 0 refills | Status: DC

## 2021-02-02 MED ORDER — ALPRAZOLAM 0.25 MG PO TABS: 0.5000 mg | ORAL_TABLET | Freq: Three times a day (TID) | ORAL | Status: DC | PRN

## 2021-02-02 NOTE — Progress Notes (Deleted)
[facility] POST-OP OFFICE VISIT  02/02/2021  HPI: Anna Flores is a 30 y.o. female *** days s/p ***  Vital signs: BP 138/81   Pulse 93   Temp 98.4 F (36.9 C) (Oral)   Wt 198 lb 9.6 oz (90.1 kg)   LMP 01/12/2021 (Exact Date)   SpO2 98%   BMI 33.05 kg/m    Physical Exam: Constitutional: *** Abdomen: *** Skin: ***  Assessment/Plan: This is a 31 y.o. female *** days s/p ***   - ***  -- Lynden Oxford, PA-C [facility] 02/02/2021, 11:35 AM [phone] M-F: 7am - 4pm

## 2021-02-02 NOTE — ED Triage Notes (Addendum)
Pt states she was admitted her for mastitis on the 17th and d/c 2 days later pt had aspiration done of breast tissue this past Monday, pt states infection has gotten worse since aspiration, pt with swelling and redness to right breast. Pt is currently breast feeding 7month old but has refrained from doing so on right side. Pt now having sharp chest pains

## 2021-02-02 NOTE — Progress Notes (Signed)
Outpatient Surgical Follow Up  02/02/2021  Anna Flores is an 30 y.o. female.   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 She has been having significant breast issues for several weeks.  I did an aspiration of galactocele 4 days ago in the office.  She does have significant anxiety.  I was able to aspirate about 4 cc of milky fluid.  Final culture grew some mixed flora likely contaminant. She now comes in for increased pain on her right breast.  She is states that the pain is worse even before the aspiration.  Now she is got some erythema around that area.  She reports some low-grade temperature.  She recently went to the emergency room where an ultrasound was performed.  I have reviewed the report showing similar appearances of a complex cystic lesion in the right breast no evidence of definitive drainable collections. There is significant social stressors at home.  She has had some financial stressors as well .   Past Medical History:  Diagnosis Date   Abdominal pain 10/26/2013   Anemia 2013   Anxiety    Back pain affecting pregnancy in third trimester 02/28/2020   Depression    History of Papanicolaou smear of cervix 10/02/2016   NEG   IDA (iron deficiency anemia) 02/29/2020   Indication for care in labor and delivery, antepartum 03/07/2020   Menometrorrhagia 10/26/2013   Pregnancy    DELIVERED 08/12/16 - [facility]    Past Surgical History:  Procedure Laterality Date   DIAGNOSTIC LAPAROSCOPY  2018   suspected ectopic pregnancy-[facility]   TONSILLECTOMY      Family History  Adopted: Yes  Problem Relation Age of Onset   Nevi Sister        DISPLASTIC NEVUS   Ovarian cancer Mother    Cirrhosis Father     Social History:  reports that she has never smoked. She has never used smokeless tobacco. She reports that she does not drink alcohol and does not  use drugs.  Allergies:  Allergies  Allergen Reactions   Amoxicillin Hives   Hydrocodone Shortness Of Breath and Other (See Comments)    "loss of consciousness"; "felt really weird" when she was given 10 mgm hydrocodone. Did well with 5 mgm tablet   Vancomycin Itching    C/O head burning and itching   Reglan [Metoclopramide] Other (See Comments)   Cherry Hives    Medications reviewed.    ROS Full ROS performed and is otherwise negative other than what is stated in HPI   BP 138/81   Pulse 93   Temp 98.4 F (36.9 C) (Oral)   Wt 198 lb 9.6 oz (90.1 kg)   LMP 01/12/2021 (Exact Date)   SpO2 98%   BMI 33.05 kg/m   Physical Exam NAD , alert non toxic Breast: Right breast showing significant tenderness palpation on the upper inner quadrant.  Seems to be some erythema lateral to the nipple.  The nipple is inverted.  This has always been inverted.  She is very tender to palpation and this has always been the case.  There is no evidence of necrotizing infection.  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  Pain did not respond to aspiration.  There is some erythema at this questionable cellulitis versus some reaction  from the local anesthetic.  There is no evidence of an abscess there is no evidence of necrotizing infection she is not septic and does not require hospital admission.  She does have a significant component of anxiety.  I do think that she will be better managed with a combination therapy of Xanax and gabapentin.  She already has some pain medicine at home that she will continue to use.  Discussed with her about potentially changing antibiotics.  She does have some financial constraints and doxycycline was the last prescription that she cannot.  She wishes to continue to complete the course of antibiotic therapy.  I will see her back next week.  Extensive counseling provided.    Greater than  50% of the 30 minutes  visit was spent in counseling/coordination of care   Sterling Big, MD [facility] General Surgeon

## 2021-02-02 NOTE — Patient Instructions (Addendum)
Continue on the Doxycycline. We will send you in a prescription for Alanson Aly to help with anxiety. You should take this in the evening to help you sleep. We will also send you in a prescription for Gabapentin for pain. You will take this as prescribed.   Follow up here on Wednesday.

## 2021-02-03 ENCOUNTER — Inpatient Hospital Stay
Admission: EM | Admit: 2021-02-03 | Discharge: 2021-02-08 | DRG: 769 | Disposition: A | Discharge status: Home / Self Care | Payer: Medicaid Other | Attending: Surgery | Admitting: Surgery

## 2021-02-03 ENCOUNTER — Emergency Department: Payer: Medicaid Other

## 2021-02-03 DIAGNOSIS — O9112 Abscess of breast associated with the puerperium: Secondary | ICD-10-CM | POA: Diagnosis present

## 2021-02-03 DIAGNOSIS — N611 Abscess of the breast and nipple: Secondary | ICD-10-CM | POA: Diagnosis not present

## 2021-02-03 DIAGNOSIS — R079 Chest pain, unspecified: Secondary | ICD-10-CM | POA: Diagnosis not present

## 2021-02-03 DIAGNOSIS — O9279 Other disorders of lactation: Secondary | ICD-10-CM | POA: Diagnosis present

## 2021-02-03 DIAGNOSIS — N644 Mastodynia: Secondary | ICD-10-CM | POA: Diagnosis not present

## 2021-02-03 LAB — CBC
HCT: 34.5 % — ABNORMAL LOW (ref 36.0–46.0)
Hemoglobin: 11.9 g/dL — ABNORMAL LOW (ref 12.0–15.0)
MCH: 29.7 pg (ref 26.0–34.0)
MCHC: 34.5 g/dL (ref 30.0–36.0)
MCV: 86 fL (ref 80.0–100.0)
Platelets: 307 10*3/uL (ref 150–400)
RBC: 4.01 MIL/uL (ref 3.87–5.11)
RDW: 12.2 % (ref 11.5–15.5)
WBC: 9.8 10*3/uL (ref 4.0–10.5)
nRBC: 0 % (ref 0.0–0.2)

## 2021-02-03 LAB — LACTIC ACID, PLASMA
Lactic Acid, Venous: 1 mmol/L (ref 0.5–1.9)
Lactic Acid, Venous: 1 mmol/L (ref 0.5–1.9)

## 2021-02-03 LAB — CREATININE, SERUM
Creatinine, Ser: 0.78 mg/dL (ref 0.44–1.00)
GFR, Estimated: >60 mL/min (ref >60)

## 2021-02-03 LAB — TROPONIN I (HIGH SENSITIVITY): Troponin I (High Sensitivity): <2 ng/L (ref <18)

## 2021-02-03 MED ORDER — ENOXAPARIN SODIUM 60 MG/0.6ML IJ SOSY
0.5000 mg/kg | PREFILLED_SYRINGE | INTRAMUSCULAR | Status: DC
  Filled 2021-02-03: qty 0.6
  Filled 2021-02-03 (×2): qty 0.45

## 2021-02-03 MED ORDER — ACETAMINOPHEN 500 MG PO TABS
1000.0000 mg | ORAL_TABLET | Freq: Four times a day (QID) | ORAL | Status: DC
  Administered 2021-02-03 – 2021-02-08 (×19): 1000 mg via ORAL
  Filled 2021-02-03 (×20): qty 2

## 2021-02-03 MED ORDER — MORPHINE SULFATE (PF) 4 MG/ML IV SOLN
4.0000 mg | Freq: Once | INTRAVENOUS | Status: AC
  Administered 2021-02-03: 4 mg via INTRAVENOUS
  Filled 2021-02-03: qty 1

## 2021-02-03 MED ORDER — OXYCODONE HCL 5 MG PO TABS
5.0000 mg | ORAL_TABLET | ORAL | Status: DC | PRN
  Administered 2021-02-03 – 2021-02-08 (×20): 5 mg via ORAL
  Filled 2021-02-03 (×21): qty 1

## 2021-02-03 MED ORDER — ALPRAZOLAM 0.5 MG PO TABS
0.5000 mg | ORAL_TABLET | Freq: Three times a day (TID) | ORAL | Status: DC | PRN
Start: 2021-02-03 — End: 2021-02-08
  Administered 2021-02-03 – 2021-02-08 (×15): 0.5 mg via ORAL
  Filled 2021-02-03 (×16): qty 1

## 2021-02-03 MED ORDER — ONDANSETRON 4 MG PO TBDP
4.0000 mg | ORAL_TABLET | Freq: Four times a day (QID) | ORAL | Status: DC | PRN
  Administered 2021-02-03 – 2021-02-08 (×12): 4 mg via ORAL
  Filled 2021-02-03 (×12): qty 1

## 2021-02-03 MED ORDER — ONDANSETRON HCL 4 MG/2ML IJ SOLN
4.0000 mg | Freq: Four times a day (QID) | INTRAMUSCULAR | Status: DC | PRN
Start: 2021-02-03 — End: 2021-02-08
  Administered 2021-02-03 (×2): 4 mg via INTRAVENOUS
  Filled 2021-02-03 (×2): qty 2

## 2021-02-03 MED ORDER — ESCITALOPRAM OXALATE 10 MG PO TABS
10.0000 mg | ORAL_TABLET | Freq: Every day | ORAL | Status: DC
  Administered 2021-02-03 – 2021-02-08 (×6): 10 mg via ORAL
  Filled 2021-02-03 (×6): qty 1

## 2021-02-03 MED ORDER — GABAPENTIN 300 MG PO CAPS
300.0000 mg | ORAL_CAPSULE | Freq: Three times a day (TID) | ORAL | Status: DC
  Administered 2021-02-03 – 2021-02-08 (×15): 300 mg via ORAL
  Filled 2021-02-03 (×15): qty 1

## 2021-02-03 MED ORDER — KETOROLAC TROMETHAMINE 30 MG/ML IJ SOLN
30.0000 mg | Freq: Four times a day (QID) | INTRAMUSCULAR | Status: AC | PRN
  Administered 2021-02-03 – 2021-02-07 (×5): 30 mg via INTRAVENOUS
  Filled 2021-02-03 (×5): qty 1

## 2021-02-03 MED ORDER — IBUPROFEN 600 MG PO TABS
600.0000 mg | ORAL_TABLET | Freq: Four times a day (QID) | ORAL | Status: DC | PRN
  Administered 2021-02-03 – 2021-02-05 (×6): 600 mg via ORAL
  Filled 2021-02-03 (×5): qty 1

## 2021-02-03 MED ORDER — KCL IN DEXTROSE-NACL 20-5-0.45 MEQ/L-%-% IV SOLN
INTRAVENOUS | Status: DC
  Filled 2021-02-03 (×11): qty 1000

## 2021-02-03 MED ORDER — ONDANSETRON HCL 4 MG/2ML IJ SOLN
4.0000 mg | Freq: Once | INTRAMUSCULAR | Status: AC
  Administered 2021-02-03: 4 mg via INTRAVENOUS
  Filled 2021-02-03: qty 2

## 2021-02-03 MED ORDER — CLINDAMYCIN PHOSPHATE 600 MG/50ML IV SOLN
600.0000 mg | Freq: Three times a day (TID) | INTRAVENOUS | Status: DC
  Administered 2021-02-03 – 2021-02-08 (×15): 600 mg via INTRAVENOUS
  Filled 2021-02-03 (×17): qty 50

## 2021-02-03 MED ORDER — CLINDAMYCIN PHOSPHATE 600 MG/50ML IV SOLN
600.0000 mg | Freq: Once | INTRAVENOUS | Status: AC
  Administered 2021-02-03: 600 mg via INTRAVENOUS
  Filled 2021-02-03: qty 50

## 2021-02-03 NOTE — ED Notes (Signed)
Report to lorrie lemons, rn.

## 2021-02-03 NOTE — Plan of Care (Signed)
?  Problem: Clinical Measurements: ?Goal: Ability to avoid or minimize complications of infection will improve ?Outcome: Progressing ?  ?Problem: Skin Integrity: ?Goal: Skin integrity will improve ?Outcome: Progressing ?  ?

## 2021-02-03 NOTE — Progress Notes (Signed)
PHARMACIST - PHYSICIAN COMMUNICATION  CONCERNING:  Enoxaparin (Lovenox) for DVT Prophylaxis    RECOMMENDATION: Patient was prescribed enoxaparin 40mg  q24 hours for VTE prophylaxis.   Filed Weights   02/02/21 2312  Weight: 90.1 kg (198 lb 9.6 oz)    Body mass index is 33.05 kg/m.  Estimated Creatinine Clearance: 116 mL/min (by C-G formula based on SCr of 0.7 mg/dL).   Based on [facility] policy patient is candidate for enoxaparin 0.5mg /kg TBW SQ every 24 hours based on BMI being >30.  DESCRIPTION: Pharmacy has adjusted enoxaparin dose per [facility] policy.  Patient is now receiving enoxaparin 45 mg every 24 hours   [facility] 02/03/2021 8:06 AM
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
## 2021-02-03 NOTE — ED Notes (Signed)
2nd lactic not collected due to first lactic being negative.

## 2021-02-03 NOTE — H&P (Addendum)
Anna Flores is an 30 y.o. female.   Chief Complaint: Right breast pain HPI: This is a 30 year old woman who has been seen on several occasions by Dr. Sterling Bigiego Pabon.  She had been referred to general surgery by Dr. Tiburcio PeaHarris, OB/GYN after presenting to the emergency department with redness, pain, and swelling in her right breast.  She is about 10 months postpartum and is still breast-feeding her daughter.  An ultrasound performed in the emergency department on June 15 showed areas of complex cystic and solid processes and it was not felt that the breast required incision and drainage.  Bactrim was initiated.  Her symptoms worsened and apparently her right nipple began to invert.  She was admitted to the hospital on June 17 for mastitis and IV antibiotics.  Dr. Everlene FarrierPabon saw her in consultation.  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  No evidence of necrotizing infection or abscess appreciated on CT. according to his consultation note, he discussed the patient's case with Dr. Connye Burkittavies, breast radiology, and recommended that she undergo an ultrasound-guided aspiration of the galactocele the following week.  The patient apparently called the general surgery clinic requesting to have the aspiration performed sooner of then had been arranged in radiology.  Dr. Everlene FarrierPabon saw her in clinic on January 29, 2021.  He aspirated 4 cc of milky fluid from the galactocele, which was sent for culture and sensitivities.  Microbiology showed only mixed skin flora.  The day after her aspiration, the patient called the office again saying that she had a lot more pain and redness and swelling in the area.  [facility].  They did perform an ultrasound that showed a heterogeneous hypoechoic region measuring 3.7 x 2.9 x 3.7 cm underlying the nipple areolar complex.  There were no anechoic fluid pockets identified for drainage.  She was diagnosed  with cellulitis and started on doxycycline.  She then saw OB/GYN on June 30.  Their note was reviewed and states "right breast is red over most of the tissue, with notable edema on the areola and nipple that appears yellowish.  The skin appears similar to peau d'orange."  Yesterday, she saw Dr. Everlene FarrierPabon in clinic for worsening pain.  According to his note, the patient reported that the pain is even worse than before the aspiration.  She also reported a low-grade fever to him.  His note does document the presence of significant social and financial stressors for the patient as well as a fair amount of anxiety.  On his exam, there did not appear to be any significant change, no abscess or evidence of necrotizing infection.  He prescribed a combination of Xanax and gabapentin to help manage the anxiety component of her issue.  A change in antibiotic therapy was offered, but due to her financial concerns, she wished to complete the course of doxycycline that had been prescribed.  Late last evening, I received a phone call from our answering service indicating that she was continuing to have issues and their triage decision making tree indicated that she should present to the emergency department.  She was seen early this morning her labs demonstrated a mild leukocytosis of 11.6.  Other labs were unremarkable.  An ultrasound was performed which showed a 3.3 x 5.3 x 5.3 cm area of complex echogenicity, suggestive of a subareolar hematoma and a superinfected hematoma was  not excluded.  Due to her failure of outpatient therapy, she is being admitted for IV antibiotics and potential incision and drainage.  This morning, her leukocytosis has improved somewhat.  She continues to complain of pain but feels like the redness has improved.  She reports having had a fever up to 103 F at home, but no fever has been recorded since presentation to the hospital here  Past Medical History:  Diagnosis Date   Abdominal pain 10/26/2013    Anemia 2013   Anxiety    Back pain affecting pregnancy in third trimester 02/28/2020   Depression    History of Papanicolaou smear of cervix 10/02/2016   NEG   IDA (iron deficiency anemia) 02/29/2020   Indication for care in labor and delivery, antepartum 03/07/2020   Menometrorrhagia 10/26/2013   Pregnancy    DELIVERED 08/12/16 - [facility]    Past Surgical History:  Procedure Laterality Date   DIAGNOSTIC LAPAROSCOPY  2018   suspected ectopic pregnancy-[facility]   TONSILLECTOMY      Family History  Adopted: Yes  Problem Relation Age of Onset   Nevi Sister        DISPLASTIC NEVUS   Ovarian cancer Mother    Cirrhosis Father    Social History:  reports that she has never smoked. She has never used smokeless tobacco. She reports that she does not drink alcohol and does not use drugs.  Allergies:  Allergies  Allergen Reactions   Amoxicillin Hives   Hydrocodone Shortness Of Breath and Other (See Comments)    "loss of consciousness"; "felt really weird" when she was given 10 mgm hydrocodone. Did well with 5 mgm tablet   Vancomycin Itching    C/O head burning and itching   Reglan [Metoclopramide] Other (See Comments)   Cherry Hives    Medications Prior to Admission  Medication Sig Dispense Refill   acetaminophen (TYLENOL) 500 MG tablet Take 1,000 mg by mouth every 6 (six) hours as needed.     doxycycline (VIBRAMYCIN) 100 MG capsule Take 100 mg by mouth 2 (two) times daily.     gabapentin (NEURONTIN) 300 MG capsule Take 1 capsule (300 mg total) by mouth 3 (three) times daily. 42 capsule 0   ibuprofen (ADVIL) 200 MG tablet Take 400 mg by mouth every 6 (six) hours as needed.     ondansetron (ZOFRAN) 8 MG tablet Take by mouth every 8 (eight) hours as needed for nausea or vomiting.     oxyCODONE (ROXICODONE) 5 MG immediate release tablet Take 1 tablet (5 mg total) by mouth every 4 (four) hours as needed for severe pain. 30 tablet 0   ALPRAZolam (XANAX) 0.5 MG tablet Take 1 tablet (0.5 mg total)  by mouth 3 (three) times daily as needed for anxiety. (Patient not taking: Reported on 02/03/2021) 20 tablet 0   escitalopram (LEXAPRO) 10 MG tablet Take 1 tablet (10 mg total) by mouth daily. (Patient not taking: Reported on 02/03/2021) 30 tablet 3    Results for orders placed or performed during the hospital encounter of 02/03/21 (from the past 48 hour(s))  Lactic acid, plasma     Status: None   Collection Time: 02/02/21 11:15 PM  Result Value Ref Range   Lactic Acid, Venous 1.2 0.5 - 1.9 mmol/L    Comment: Performed at [facility], [address] [place], [place] [address]  CBC with Differential     Status: Abnormal   Collection Time: 02/02/21 11:16 PM  Result Value Ref Range  WBC 11.6 (H) 4.0 - 10.5 K/uL   RBC 4.04 3.87 - 5.11 MIL/uL   Hemoglobin 11.8 (L) 12.0 - 15.0 g/dL   HCT 09.8 (L) 11.9 - 14.7 %   MCV 87.1 80.0 - 100.0 fL   MCH 29.2 26.0 - 34.0 pg   MCHC 33.5 30.0 - 36.0 g/dL   RDW 82.9 56.2 - 13.0 %   Platelets 341 150 - 400 K/uL   nRBC 0.0 0.0 - 0.2 %   Neutrophils Relative % 68 %   Neutro Abs 8.0 (H) 1.7 - 7.7 K/uL   Lymphocytes Relative 23 %   Lymphs Abs 2.6 0.7 - 4.0 K/uL   Monocytes Relative 8 %   Monocytes Absolute 0.9 0.1 - 1.0 K/uL   Eosinophils Relative 1 %   Eosinophils Absolute 0.1 0.0 - 0.5 K/uL   Basophils Relative 0 %   Basophils Absolute 0.0 0.0 - 0.1 K/uL   Immature Granulocytes 0 %   Abs Immature Granulocytes 0.04 0.00 - 0.07 K/uL    Comment: Performed at [facility], [address] [place], [place] [address]  Basic metabolic panel     Status: Abnormal   Collection Time: 02/02/21 11:16 PM  Result Value Ref Range   Sodium 138 135 - 145 mmol/L   Potassium 3.6 3.5 - 5.1 mmol/L   Chloride 102 98 - 111 mmol/L   CO2 25 22 - 32 mmol/L   Glucose, Bld 110 (H) 70 - 99 mg/dL    Comment: Glucose reference range applies only to samples taken after fasting for at least 8 hours.   BUN 9 6 - 20 mg/dL   Creatinine, Ser 4.69 0.44 - 1.00  mg/dL   Calcium 9.5 8.9 - 62.9 mg/dL   GFR, Estimated >52 >84 mL/min    Comment: (NOTE) Calculated using the CKD-EPI Creatinine Equation (2021)    Anion gap 11 5 - 15    Comment: Performed at [facility], [address] [place], [place] [address]  Troponin I (High Sensitivity)     Status: None   Collection Time: 02/02/21 11:16 PM  Result Value Ref Range   Troponin I (High Sensitivity) <2 <18 ng/L    Comment: (NOTE) Elevated high sensitivity troponin I (hsTnI) values and significant  changes across serial measurements may suggest ACS but many other  chronic and acute conditions are known to elevate hsTnI results.  Refer to the "Links" section for chest pain algorithms and additional  guidance. Performed at [facility], [address] [place], [place] [address]   Troponin I (High Sensitivity)     Status: None   Collection Time: 02/03/21  4:38 AM  Result Value Ref Range   Troponin I (High Sensitivity) <2 <18 ng/L    Comment: (NOTE) Elevated high sensitivity troponin I (hsTnI) values and significant  changes across serial measurements may suggest ACS but many other  chronic and acute conditions are known to elevate hsTnI results.  Refer to the "Links" section for chest pain algorithms and additional  guidance. Performed at [facility], [address] [place], [place] [address]   Lactic acid, plasma     Status: None   Collection Time: 02/03/21  4:38 AM  Result Value Ref Range   Lactic Acid, Venous 1.0 0.5 - 1.9 mmol/L    Comment: Performed at [facility], [address] [place], [place] [address]  Blood culture (single)     Status: None (Preliminary result)   Collection Time: 02/03/21  4:38 AM   Specimen: BLOOD  Result Value Ref Range   Specimen Description BLOOD LEFT ANTECUBITAL    Special Requests      BOTTLES DRAWN AEROBIC AND ANAEROBIC Blood Culture adequate volume   Culture      NO GROWTH < 12 HOURS Performed at [facility], [address] [place], [place] [address]    Report Status PENDING   Lactic acid, plasma     Status: None   Collection Time: 02/03/21  9:58 AM  Result Value Ref Range   Lactic Acid, Venous 1.0 0.5 - 1.9 mmol/L    Comment: Performed at [facility], [address] [place], [place] [address]  CBC     Status: Abnormal   Collection Time: 02/03/21  9:58 AM  Result Value Ref Range   WBC 9.8 4.0 - 10.5 K/uL   RBC 4.01 3.87 - 5.11 MIL/uL   Hemoglobin 11.9 (L) 12.0 - 15.0 g/dL   HCT 35.4 (L) 65.6 - 81.2 %   MCV 86.0 80.0 - 100.0 fL   MCH 29.7 26.0 - 34.0 pg   MCHC 34.5 30.0 - 36.0 g/dL   RDW 75.1 70.0 - 17.4 %   Platelets 307 150 - 400 K/uL   nRBC 0.0 0.0 - 0.2 %    Comment: Performed at [facility], [address] [place], [place] [address]  Creatinine, serum     Status: None   Collection Time: 02/03/21  9:58 AM  Result Value Ref Range   Creatinine, Ser 0.78 0.44 - 1.00 mg/dL   GFR, Estimated >75 >91 mL/min    Comment: (NOTE) Calculated using the CKD-EPI Creatinine Equation (2021) Performed at [facility], [address], [place], [place] [address]    DG Chest 2 View  Result Date: 02/02/2021 CLINICAL DATA:  Chest pain. Patient reports ongoing mastitis with breast swelling and redness. EXAM: CHEST - 2 VIEW COMPARISON:  Radiographs 05/16/2020.  Chest CT 01/20/2021 FINDINGS: The cardiomediastinal contours are normal. The lungs are clear. Pulmonary vasculature is normal. No consolidation, pleural effusion, or pneumothorax. No acute osseous abnormalities are seen. IMPRESSION: Negative radiographs of the chest. Electronically Signed   By: Narda Rutherford M.D.   On: 02/02/2021 23:35   US BREAST LTD UNI RIGHT INC AXILLA  Result Date: 02/03/2021 CLINICAL DATA:  30 year old female with possible breast abscess. Status post surgical drainage of abscess 6 days ago. Severe pain redness and swelling. EXAM: ULTRASOUND OF THE RIGHT BREAST COMPARISON:   01/17/2021 FINDINGS: Today in the right retro areolar region corresponding to the area of 2.7 cm cystic lesion last month there is a 3.3 x 5.3 x 5.3 cm area of complex echogenicity, minimal vascular elements (image 49). Deep and peripheral to that lesion a small bilobed simple appearing breast cyst is suspected on series 2, image 115. IMPRESSION: Breast ultrasound suggests a right side subareolar hematoma now, up to 5.3 cm diameter. Superinfected hematoma is not excluded. If the patient is not being admitted then recommend starting an appropriate course of antibiotics and referred to a Breast Surgeon or a dedicated Breast Center for possible abscess drainage. RECOMMENDATION: As above. Electronically Signed   By: Odessa Fleming M.D.   On: 02/03/2021 06:56    Review of Systems  Constitutional:  Positive for chills and fever.  Cardiovascular:  Positive for chest pain.  Psychiatric/Behavioral:  The patient is nervous/anxious.   All other systems reviewed and are negative.  Blood pressure 101/62, pulse 63, temperature 97.8 F (  36.6 C), temperature source Oral, resp. rate 18, height 5\' 5"  (1.651 m), weight 90.1 kg, last menstrual period 01/12/2021, SpO2 100 %, currently breastfeeding. Physical Exam Constitutional:      General: She is not in acute distress.    Appearance: Normal appearance. She is obese.  HENT:     Head: Normocephalic and atraumatic.     Nose: Nose normal.     Mouth/Throat:     Mouth: Mucous membranes are moist.  Eyes:     General: No scleral icterus.       Right eye: No discharge.        Left eye: No discharge.  Cardiovascular:     Rate and Rhythm: Normal rate and regular rhythm.  Pulmonary:     Effort: Pulmonary effort is normal. No respiratory distress.  Chest:  Breasts:    Right: Swelling, inverted nipple, skin change and tenderness present.     Left: Inverted nipple present.       Comments: Tender indurated area just posterior to the areola between 1 and 3 o'clock.  No  fluctuance. Erythema has retreated from inked margin.  Abdominal:     General: There is no distension.     Palpations: Abdomen is soft.  Genitourinary:    Comments: deferred Musculoskeletal:        General: No deformity or signs of injury.     Cervical back: Normal range of motion.  Skin:    General: Skin is warm and dry.  Neurological:     General: No focal deficit present.     Mental Status: She is alert.  Psychiatric:        Mood and Affect: Mood normal.     Assessment/Plan This is a 30 year old woman who had breast pain that was attributed to a galactocele.  After the galactocele was aspirated, she developed worsening pain and has failed outpatient treatment for both pain and cellulitis.  She has been initiated on IV antibiotics by the emergency department.  Imaging today did not show an obvious drainable collection however the hematoma may liquefy and be more amenable to drainage over the next day or 2.  We will continue IV antibiotics and hydration, as well as multimodal pain control.  I will make her n.p.o. after midnight and reevaluate her in the morning for potential incision and drainage, however it may be several days before the collection is amenable to surgical intervention, or it may not be necessary at all at that point.  We will continue to monitor.  Note: Greater than 70 minutes was spent in this patient encounter, greater than 50% dedicated to coordinating patient care, obtaining data and inpatient counseling.  26, MD 02/03/2021, 12:40 PM

## 2021-02-03 NOTE — ED Notes (Signed)
Les, EDT called to transport pt to the floor

## 2021-02-03 NOTE — ED Notes (Signed)
Pt stated, "I think my meds are wearing off. My chest is starting to hurt real bad." Jeannett Senior, RN made aware.

## 2021-02-03 NOTE — ED Notes (Signed)
Pt given ice pack to place under arm and breast and another to place over top as tolerated.

## 2021-02-04 LAB — BASIC METABOLIC PANEL
Anion gap: 6 (ref 5–15)
BUN: 8 mg/dL (ref 6–20)
CO2: 28 mmol/L (ref 22–32)
Calcium: 9 mg/dL (ref 8.9–10.3)
Chloride: 103 mmol/L (ref 98–111)
Creatinine, Ser: 0.71 mg/dL (ref 0.44–1.00)
GFR, Estimated: >60 mL/min (ref >60)
Glucose, Bld: 100 mg/dL — ABNORMAL HIGH (ref 70–99)
Potassium: 4.1 mmol/L (ref 3.5–5.1)
Sodium: 137 mmol/L (ref 135–145)

## 2021-02-04 LAB — CBC
HCT: 32.4 % — ABNORMAL LOW (ref 36.0–46.0)
Hemoglobin: 11 g/dL — ABNORMAL LOW (ref 12.0–15.0)
MCH: 29.5 pg (ref 26.0–34.0)
MCHC: 34 g/dL (ref 30.0–36.0)
MCV: 86.9 fL (ref 80.0–100.0)
Platelets: 291 10*3/uL (ref 150–400)
RBC: 3.73 MIL/uL — ABNORMAL LOW (ref 3.87–5.11)
RDW: 12.5 % (ref 11.5–15.5)
WBC: 6.8 10*3/uL (ref 4.0–10.5)
nRBC: 0 % (ref 0.0–0.2)

## 2021-02-04 LAB — ANAEROBIC AND AEROBIC CULTURE

## 2021-02-04 LAB — MAGNESIUM: Magnesium: 2 mg/dL (ref 1.7–2.4)

## 2021-02-04 LAB — PHOSPHORUS: Phosphorus: 3.2 mg/dL (ref 2.5–4.6)

## 2021-02-04 MED ORDER — SODIUM CHLORIDE 0.9 % IV SOLN
INTRAVENOUS | Status: DC | PRN
  Administered 2021-02-04 – 2021-02-05 (×3): 500 mL via INTRAVENOUS
  Administered 2021-02-06 – 2021-02-07 (×2): 20 mL via INTRAVENOUS
  Administered 2021-02-07: 250 mL via INTRAVENOUS
  Administered 2021-02-07 – 2021-02-08 (×2): 20 mL via INTRAVENOUS

## 2021-02-04 NOTE — Progress Notes (Signed)
Spoke to Pharmacist on right now to verify if adm of iv Toradol after 2117 dose of oral ibuprofen given earlier was ok. Pharmacist stated ok to give.

## 2021-02-04 NOTE — H&P (Signed)
Anna Flores is an 30 y.o. female.    Chief Complaint: Right breast pain HPI: This is a 30 year old woman who has been seen on several occasions by Dr. Sterling Big.  She had been referred to general surgery by Dr. Tiburcio Pea, OB/GYN after presenting to the emergency department with redness, pain, and swelling in her right breast.  She is about 10 months postpartum and is still breast-feeding her daughter.  An ultrasound performed in the emergency department on June 15 showed areas of complex cystic and solid processes and it was not felt that the breast required incision and drainage.  Bactrim was initiated.  Her symptoms worsened and apparently her right nipple began to invert.  She was admitted to the hospital on June 17 for mastitis and IV antibiotics.  Dr. Everlene Farrier saw her in consultation.  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  No evidence of necrotizing infection or abscess appreciated on CT. according to his consultation note, he discussed the patient's case with Dr. Connye Burkitt, breast radiology, and recommended that she undergo an ultrasound-guided aspiration of the galactocele the following week.  The patient apparently called the general surgery clinic requesting to have the aspiration performed sooner of then had been arranged in radiology.  Dr. Everlene Farrier saw her in clinic on January 29, 2021.  He aspirated 4 cc of milky fluid from the galactocele, which was sent for culture and sensitivities.  Microbiology showed only mixed skin flora.  The day after her aspiration, the patient called the office again saying that she had a lot more pain and redness and swelling in the area.  She went to the ER at [facility] on June 29.  They did perform an ultrasound that showed a heterogeneous hypoechoic region measuring 3.7 x 2.9 x 3.7 cm underlying the nipple areolar complex.  There were no anechoic fluid pockets identified for drainage.  She was  diagnosed with cellulitis and started on doxycycline.  She then saw OB/GYN on June 30.  Their note was reviewed and states "right breast is red over most of the tissue, with notable edema on the areola and nipple that appears yellowish.  The skin appears similar to peau d'orange."  Yesterday, she saw Dr. Everlene Farrier in clinic for worsening pain.  According to his note, the patient reported that the pain is even worse than before the aspiration.  She also reported a low-grade fever to him.  His note does document the presence of significant social and financial stressors for the patient as well as a fair amount of anxiety.  On his exam, there did not appear to be any significant change, no abscess or evidence of necrotizing infection.  He prescribed a combination of Xanax and gabapentin to help manage the anxiety component of her issue.  A change in antibiotic therapy was offered, but due to her financial concerns, she wished to complete the course of doxycycline that had been prescribed.  Late last evening, I received a phone call from our answering service indicating that she was continuing to have issues and their triage decision making tree indicated that she should present to the emergency department.  She was seen early this morning her labs demonstrated a mild leukocytosis of 11.6.  Other labs were unremarkable.  An ultrasound was performed which showed a 3.3 x 5.3 x 5.3 cm area of complex echogenicity, suggestive of a subareolar hematoma and a superinfected hematoma  was not excluded.  Due to her failure of outpatient therapy, she is being admitted for IV antibiotics and potential incision and drainage.  Interval history 02/04/2021: I had ordered a repeat ultrasound to see if there were any drainable fluid collection, but radiology felt that there would likely be no change from her admission ultrasound and therefore this was discontinued.  According to the patient's nurse, the patient has slept most of the day.  The  patient reports that her pain remains about an 8 out of 10.  Leukocytosis has resolved completely.  Vital signs are all in normal range.  Past Medical History:  Diagnosis Date   Abdominal pain 10/26/2013   Anemia 2013   Anxiety    Back pain affecting pregnancy in third trimester 02/28/2020   Depression    History of Papanicolaou smear of cervix 10/02/2016   NEG   IDA (iron deficiency anemia) 02/29/2020   Indication for care in labor and delivery, antepartum 03/07/2020   Menometrorrhagia 10/26/2013   Pregnancy    DELIVERED 08/12/16 - [facility]    Past Surgical History:  Procedure Laterality Date   DIAGNOSTIC LAPAROSCOPY  2018   suspected ectopic pregnancy-[facility]   TONSILLECTOMY      Family History  Adopted: Yes  Problem Relation Age of Onset   Nevi Sister        DISPLASTIC NEVUS   Ovarian cancer Mother    Cirrhosis Father    Social History:  reports that she has never smoked. She has never used smokeless tobacco. She reports that she does not drink alcohol and does not use drugs.  Allergies:  Allergies  Allergen Reactions   Amoxicillin Hives   Hydrocodone Shortness Of Breath and Other (See Comments)    "loss of consciousness"; "felt really weird" when she was given 10 mgm hydrocodone. Did well with 5 mgm tablet   Vancomycin Itching    C/O head burning and itching   Reglan [Metoclopramide] Other (See Comments)   Cherry Hives    Medications Prior to Admission  Medication Sig Dispense Refill   acetaminophen (TYLENOL) 500 MG tablet Take 1,000 mg by mouth every 6 (six) hours as needed.     doxycycline (VIBRAMYCIN) 100 MG capsule Take 100 mg by mouth 2 (two) times daily.     gabapentin (NEURONTIN) 300 MG capsule Take 1 capsule (300 mg total) by mouth 3 (three) times daily. 42 capsule 0   ibuprofen (ADVIL) 200 MG tablet Take 400 mg by mouth every 6 (six) hours as needed.     ondansetron (ZOFRAN) 8 MG tablet Take by mouth every 8 (eight) hours as needed for nausea or vomiting.      oxyCODONE (ROXICODONE) 5 MG immediate release tablet Take 1 tablet (5 mg total) by mouth every 4 (four) hours as needed for severe pain. 30 tablet 0   ALPRAZolam (XANAX) 0.5 MG tablet Take 1 tablet (0.5 mg total) by mouth 3 (three) times daily as needed for anxiety. (Patient not taking: Reported on 02/03/2021) 20 tablet 0   escitalopram (LEXAPRO) 10 MG tablet Take 1 tablet (10 mg total) by mouth daily. (Patient not taking: Reported on 02/03/2021) 30 tablet 3    Results for orders placed or performed during the hospital encounter of 02/03/21 (from the past 48 hour(s))  Lactic acid, plasma     Status: None   Collection Time: 02/02/21 11:15 PM  Result Value Ref Range   Lactic Acid, Venous 1.2 0.5 - 1.9 mmol/L    Comment: Performed at  [facility], [address] [place], [place] [address]  CBC with Differential     Status: Abnormal   Collection Time: 02/02/21 11:16 PM  Result Value Ref Range   WBC 11.6 (H) 4.0 - 10.5 K/uL   RBC 4.04 3.87 - 5.11 MIL/uL   Hemoglobin 11.8 (L) 12.0 - 15.0 g/dL   HCT 45.8 (L) 09.9 - 83.3 %   MCV 87.1 80.0 - 100.0 fL   MCH 29.2 26.0 - 34.0 pg   MCHC 33.5 30.0 - 36.0 g/dL   RDW 82.5 05.3 - 97.6 %   Platelets 341 150 - 400 K/uL   nRBC 0.0 0.0 - 0.2 %   Neutrophils Relative % 68 %   Neutro Abs 8.0 (H) 1.7 - 7.7 K/uL   Lymphocytes Relative 23 %   Lymphs Abs 2.6 0.7 - 4.0 K/uL   Monocytes Relative 8 %   Monocytes Absolute 0.9 0.1 - 1.0 K/uL   Eosinophils Relative 1 %   Eosinophils Absolute 0.1 0.0 - 0.5 K/uL   Basophils Relative 0 %   Basophils Absolute 0.0 0.0 - 0.1 K/uL   Immature Granulocytes 0 %   Abs Immature Granulocytes 0.04 0.00 - 0.07 K/uL    Comment: Performed at [facility], [address] [place], [place] [address]  Basic metabolic panel     Status: Abnormal   Collection Time: 02/02/21 11:16 PM  Result Value Ref Range   Sodium 138 135 - 145 mmol/L   Potassium 3.6 3.5 - 5.1 mmol/L   Chloride 102 98 - 111 mmol/L   CO2 25 22 -  32 mmol/L   Glucose, Bld 110 (H) 70 - 99 mg/dL    Comment: Glucose reference range applies only to samples taken after fasting for at least 8 hours.   BUN 9 6 - 20 mg/dL   Creatinine, Ser 3.79 0.44 - 1.00 mg/dL   Calcium 9.5 8.9 - 02.4 mg/dL   GFR, Estimated >09 >73 mL/min    Comment: (NOTE) Calculated using the CKD-EPI Creatinine Equation (2021)    Anion gap 11 5 - 15    Comment: Performed at [facility], [address] [place], [place] [address]  Troponin I (High Sensitivity)     Status: None   Collection Time: 02/02/21 11:16 PM  Result Value Ref Range   Troponin I (High Sensitivity) <2 <18 ng/L    Comment: (NOTE) Elevated high sensitivity troponin I (hsTnI) values and significant  changes across serial measurements may suggest ACS but many other  chronic and acute conditions are known to elevate hsTnI results.  Refer to the "Links" section for chest pain algorithms and additional  guidance. Performed at [facility], [address] [place], [place] [address]   Troponin I (High Sensitivity)     Status: None   Collection Time: 02/03/21  4:38 AM  Result Value Ref Range   Troponin I (High Sensitivity) <2 <18 ng/L    Comment: (NOTE) Elevated high sensitivity troponin I (hsTnI) values and significant  changes across serial measurements may suggest ACS but many other  chronic and acute conditions are known to elevate hsTnI results.  Refer to the "Links" section for chest pain algorithms and additional  guidance. Performed at [facility], [address] [place], [place] [address]   Lactic acid, plasma     Status: None   Collection Time: 02/03/21  4:38 AM  Result Value Ref Range   Lactic Acid, Venous 1.0 0.5 - 1.9  mmol/L    Comment: Performed at [facility], [address] [place], [place] [address]  Blood culture (single)     Status: None (Preliminary result)   Collection Time: 02/03/21  4:38 AM   Specimen: BLOOD  Result Value  Ref Range   Specimen Description BLOOD LEFT ANTECUBITAL    Special Requests      BOTTLES DRAWN AEROBIC AND ANAEROBIC Blood Culture adequate volume   Culture      NO GROWTH 1 DAY Performed at [facility], [address] [place], [place] [address]    Report Status PENDING   Lactic acid, plasma     Status: None   Collection Time: 02/03/21  9:58 AM  Result Value Ref Range   Lactic Acid, Venous 1.0 0.5 - 1.9 mmol/L    Comment: Performed at [facility], [address] [place], [place] [address]  CBC     Status: Abnormal   Collection Time: 02/03/21  9:58 AM  Result Value Ref Range   WBC 9.8 4.0 - 10.5 K/uL   RBC 4.01 3.87 - 5.11 MIL/uL   Hemoglobin 11.9 (L) 12.0 - 15.0 g/dL   HCT 91.434.5 (L) 78.236.0 - 95.646.0 %   MCV 86.0 80.0 - 100.0 fL   MCH 29.7 26.0 - 34.0 pg   MCHC 34.5 30.0 - 36.0 g/dL   RDW 21.312.2 08.611.5 - 57.815.5 %   Platelets 307 150 - 400 K/uL   nRBC 0.0 0.0 - 0.2 %    Comment: Performed at [facility], [address] [place], [place] [address]  Creatinine, serum     Status: None   Collection Time: 02/03/21  9:58 AM  Result Value Ref Range   Creatinine, Ser 0.78 0.44 - 1.00 mg/dL   GFR, Estimated >95>60 >28>60 mL/min    Comment: (NOTE) Calculated using the CKD-EPI Creatinine Equation (2021) Performed at [facility], [address] [place], [place] [address]   CBC     Status: Abnormal   Collection Time: 02/04/21  6:06 AM  Result Value Ref Range   WBC 6.8 4.0 - 10.5 K/uL   RBC 3.73 (L) 3.87 - 5.11 MIL/uL   Hemoglobin 11.0 (L) 12.0 - 15.0 g/dL   HCT 40.132.4 (L) 02.736.0 - 25.346.0 %   MCV 86.9 80.0 - 100.0 fL   MCH 29.5 26.0 - 34.0 pg   MCHC 34.0 30.0 - 36.0 g/dL   RDW 66.412.5 40.311.5 - 47.415.5 %   Platelets 291 150 - 400 K/uL   nRBC 0.0 0.0 - 0.2 %    Comment: Performed at [facility], [address] [place], [place] [address]  Basic metabolic panel     Status: Abnormal   Collection Time: 02/04/21  6:06 AM  Result Value Ref Range   Sodium 137  135 - 145 mmol/L   Potassium 4.1 3.5 - 5.1 mmol/L   Chloride 103 98 - 111 mmol/L   CO2 28 22 - 32 mmol/L   Glucose, Bld 100 (H) 70 - 99 mg/dL    Comment: Glucose reference range applies only to samples taken after fasting for at least 8 hours.   BUN 8 6 - 20 mg/dL   Creatinine, Ser 3.870.71 0.44 - 1.00 mg/dL   Calcium 9.0 8.9 - 56.410.3 mg/dL   GFR, Estimated >33>60 >29>60 mL/min    Comment: (NOTE) Calculated using the CKD-EPI Creatinine Equation (2021)    Anion gap 6 5 - 15    Comment: Performed at [facility], [address], [place], [place] [address]  Magnesium     Status: None   Collection Time: 02/04/21  6:06 AM  Result Value Ref Range   Magnesium 2.0 1.7 - 2.4 mg/dL    Comment: Performed at [facility], [address] [place], [place] [address]  Phosphorus     Status: None   Collection Time: 02/04/21  6:06 AM  Result Value Ref Range   Phosphorus 3.2 2.5 - 4.6 mg/dL    Comment: Performed at [facility], [address] [place], [place] [address]   DG Chest 2 View  Result Date: 02/02/2021 CLINICAL DATA:  Chest pain. Patient reports ongoing mastitis with breast swelling and redness. EXAM: CHEST - 2 VIEW COMPARISON:  Radiographs 05/16/2020.  Chest CT 01/20/2021 FINDINGS: The cardiomediastinal contours are normal. The lungs are clear. Pulmonary vasculature is normal. No consolidation, pleural effusion, or pneumothorax. No acute osseous abnormalities are seen. IMPRESSION: Negative radiographs of the chest. Electronically Signed   By: Narda Rutherford M.D.   On: 02/02/2021 23:35   US BREAST LTD UNI RIGHT INC AXILLA  Result Date: 02/03/2021 CLINICAL DATA:  30 year old female with possible breast abscess. Status post surgical drainage of abscess 6 days ago. Severe pain redness and swelling. EXAM: ULTRASOUND OF THE RIGHT BREAST COMPARISON:  01/17/2021 FINDINGS: Today in the right retro areolar region corresponding to the area of 2.7 cm cystic lesion last month there is  a 3.3 x 5.3 x 5.3 cm area of complex echogenicity, minimal vascular elements (image 49). Deep and peripheral to that lesion a small bilobed simple appearing breast cyst is suspected on series 2, image 115. IMPRESSION: Breast ultrasound suggests a right side subareolar hematoma now, up to 5.3 cm diameter. Superinfected hematoma is not excluded. If the patient is not being admitted then recommend starting an appropriate course of antibiotics and referred to a Breast Surgeon or a dedicated Breast Center for possible abscess drainage. RECOMMENDATION: As above. Electronically Signed   By: Odessa Fleming M.D.   On: 02/03/2021 06:56    Review of Systems  Constitutional:  Positive for fatigue. Negative for chills and fever.  Cardiovascular:  Positive for chest pain.       Breast pain  Psychiatric/Behavioral:  The patient is nervous/anxious.   All other systems reviewed and are negative.  Blood pressure 110/65, pulse 80, temperature 98 F (36.7 C), temperature source Oral, resp. rate 18, height  (1.651 m), weight 90.1 kg, last menstrual period 01/12/2021, SpO2 98 %, currently breastfeeding. Physical Exam Constitutional:      General: She is not in acute distress.    Appearance: Normal appearance. She is obese.  HENT:     Head: Normocephalic and atraumatic.     Nose: Nose normal.     Mouth/Throat:     Mouth: Mucous membranes are moist.  Eyes:     General: No scleral icterus.       Right eye: No discharge.        Left eye: No discharge.  Cardiovascular:     Rate and Rhythm: Normal rate and regular rhythm.  Pulmonary:     Effort: Pulmonary effort is normal. No respiratory distress.  Chest:  Breasts:    Right: Swelling, inverted nipple, skin change and tenderness present.     Left: Inverted nipple present.       Comments: Tender indurated area just posterior to the areola between 1 and 3 o'clock.  No fluctuance. Erythema has retreated from inked  margin.  Abdominal:     General: There is no  distension.     Palpations: Abdomen is soft.  Genitourinary:    Comments: deferred Musculoskeletal:        General: No deformity or signs of injury.     Cervical back: Normal range of motion.  Skin:    General: Skin is warm and dry.  Neurological:     General: No focal deficit present.     Mental Status: She is alert.  Psychiatric:        Mood and Affect: Mood normal.     Assessment/Plan This is a 30 year old woman who had breast pain that was attributed to a galactocele.  After the galactocele was aspirated, she developed worsening pain and has failed outpatient treatment for both pain and cellulitis.  She is being managed with IV clindamycin and multimodal pain control.  She continues to endorse severe pain right at the nipple.  Overall, the breast appears to be improving significantly, with near resolution of the erythema.  I am not sure that she will gain significant benefit from an incision in her breast as there is no clearly drainable fluid collection.  Dr. Everlene Farrier will be here tomorrow and I will allow him to evaluate further and determine whether or not he wishes to perform a surgical evacuation of the hematoma.    Duanne Guess, MD 02/04/2021, 5:20 PM

## 2021-02-05 ENCOUNTER — Inpatient Hospital Stay: Payer: Medicaid Other

## 2021-02-05 ENCOUNTER — Encounter: Payer: Self-pay | Admitting: General Surgery

## 2021-02-05 MED ORDER — BROMOCRIPTINE MESYLATE 2.5 MG PO TABS
1.2500 mg | ORAL_TABLET | Freq: Two times a day (BID) | ORAL | Status: DC
  Administered 2021-02-05 – 2021-02-08 (×4): 1.25 mg via ORAL
  Filled 2021-02-05 (×8): qty 1

## 2021-02-05 MED ORDER — BROMOCRIPTINE MESYLATE 2.5 MG PO TABS: 2.5000 mg | ORAL_TABLET | Freq: Two times a day (BID) | ORAL | Status: DC

## 2021-02-05 MED ORDER — IOHEXOL 300 MG/ML  SOLN
75.0000 mL | Freq: Once | INTRAMUSCULAR | Status: AC | PRN
  Administered 2021-02-05: 75 mL via INTRAVENOUS

## 2021-02-05 NOTE — Progress Notes (Signed)
CC: mastitis infected galactocele Subjective: Anna Flores is well-known to me.  She has had history of right-sided breast pain with a galactocele that has been very symptomatic and causing significant pain. Needed hospital admission due to severe pain. I have personally reviewed the ultrasound showing complex fluid collection on the right breast.  No discrete drainable collections. Patient has been frustrated with the entire situation.  I did reassure her that her concerns or valid and  I was here to help her.  She has a perception that I attributed most of her pain to anxiety.  I explained to her that some benign breast problem are very complex there is no necessarily a good immediate solution.  She has seem to be responded well to the multimodal therapy to include gabapentin oxycodone and Xanax.  He has also tried the ice pack with good results.. She reiterated to me that she wants this fixed once and for all.  Again explained to her that we will do a stepwise fashion approach and I would not like to burn any bridges. I do think that for clarity and to compare apples to apples I do think that is reasonable to do a CT of the chest to evaluate for any other potential issues within the right breast.  Have reviewed the ultrasound and there seems to be a complex area but the ultrasound was limited due to severe pain.  I Do not think that she will tolerate any other manipulation to the breast  There Has been no objective fevers for the last 5 days.   Objective: Vital signs in last 24 hours: Temp:  [97.7 F (36.5 C)-98.2 F (36.8 C)] 98.2 F (36.8 C) (07/04 0855) Pulse Rate:  [65-80] 70 (07/04 0855) Resp:  [18] 18 (07/04 0855) BP: (106-121)/(61-75) 114/73 (07/04 0855) SpO2:  [96 %-100 %] 99 % (07/04 0855) Last BM Date: 02/01/21  Intake/Output from previous day: 07/03 0701 - 07/04 0700 In: 1152.9 [I.V.:1052.9; IV Piggyback:100] Out: -  Intake/Output this shift: Total I/O In: 111.3 [I.V.:11.3; IV  Piggyback:100] Out: -   Physical exam: NAD Breast: there is limited area of erythema located 20 12:00 and 3:00 periareolar area.  There is some edema within that area and the nipple is retracted.  No evidence of necrotizing infection.  Exam is very tender to palpation and this is limited.  Please note that there was a chaperone present  Lab Results: CBC  Recent Labs    02/03/21 0958 02/04/21 0606  WBC 9.8 6.8  HGB 11.9* 11.0*  HCT 34.5* 32.4*  PLT 307 291   BMET Recent Labs    02/02/21 2316 02/03/21 0958 02/04/21 0606  NA 138  --  137  K 3.6  --  4.1  CL 102  --  103  CO2 25  --  28  GLUCOSE 110*  --  100*  BUN 9  --  8  CREATININE 0.70 0.78 0.71  CALCIUM 9.5  --  9.0   PT/INR No results for input(s): LABPROT, INR in the last 72 hours. ABG No results for input(s): PHART, HCO3 in the last 72 hours.  Invalid input(s): PCO2, PO2  Studies/Results: No results found.  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  I will obtain a CT of the chest to have more objectivity to the breast lesion.  Unable to perform more ultrasounds due to pain.  We will continue antibiotics for now.  Right now there seems to be no need for immediate surgical intervention.  We will continue to assess the situation.  Still requiring medicines to help with pain control.  Not ready for discharge. Please note that I spent over 40 minutes in this encounter.   Sterling Big, MD, [facility]  02/05/2021

## 2021-02-05 NOTE — Progress Notes (Signed)
Ct reviewed pers. Complex fluid collection. D/W the pt in detail Given partial response to a/bs rx option of Incision and drainage in THe OR offered to the pt. SHe wishes to go ahead and have this done. We will plan to do this tomorrow. D/W the pt in detail . SHe understands.

## 2021-02-06 ENCOUNTER — Encounter
Admission: EM | Disposition: A | Discharge status: Home / Self Care | Payer: Self-pay | Source: Home / Self Care | Attending: Surgery

## 2021-02-06 ENCOUNTER — Inpatient Hospital Stay: Payer: Medicaid Other | Admitting: Anesthesiology

## 2021-02-06 ENCOUNTER — Encounter: Payer: Self-pay | Admitting: Surgery

## 2021-02-06 HISTORY — PX: INCISION AND DRAINAGE ABSCESS: SHX5864

## 2021-02-06 LAB — POCT PREGNANCY, URINE: Preg Test, Ur: NEGATIVE

## 2021-02-06 SURGERY — INCISION AND DRAINAGE, ABSCESS
Anesthesia: General | Laterality: Right

## 2021-02-06 MED ORDER — FENTANYL CITRATE (PF) 100 MCG/2ML IJ SOLN
INTRAMUSCULAR | Status: AC
  Filled 2021-02-06: qty 2

## 2021-02-06 MED ORDER — CHLORHEXIDINE GLUCONATE 0.12 % MT SOLN: 15.0000 mL | Freq: Once | OROMUCOSAL | Status: AC

## 2021-02-06 MED ORDER — PROPOFOL 10 MG/ML IV BOLUS
INTRAVENOUS | Status: DC | PRN
  Administered 2021-02-06: 150 mg via INTRAVENOUS

## 2021-02-06 MED ORDER — MIDAZOLAM HCL 2 MG/2ML IJ SOLN
INTRAMUSCULAR | Status: AC
  Filled 2021-02-06: qty 2

## 2021-02-06 MED ORDER — LACTATED RINGERS IV SOLN: INTRAVENOUS | Status: DC

## 2021-02-06 MED ORDER — BUPIVACAINE LIPOSOME 1.3 % IJ SUSP
INTRAMUSCULAR | Status: DC | PRN
  Administered 2021-02-06: 20 mL

## 2021-02-06 MED ORDER — FENTANYL CITRATE (PF) 100 MCG/2ML IJ SOLN
INTRAMUSCULAR | Status: AC
  Administered 2021-02-06: 25 ug via INTRAVENOUS
  Filled 2021-02-06: qty 2

## 2021-02-06 MED ORDER — OXYCODONE HCL 5 MG PO TABS: 5.0000 mg | ORAL_TABLET | Freq: Once | ORAL | Status: DC | PRN

## 2021-02-06 MED ORDER — BUPIVACAINE LIPOSOME 1.3 % IJ SUSP
INTRAMUSCULAR | Status: AC
  Filled 2021-02-06: qty 20

## 2021-02-06 MED ORDER — OXYCODONE HCL 5 MG/5ML PO SOLN: 5.0000 mg | Freq: Once | ORAL | Status: DC | PRN

## 2021-02-06 MED ORDER — PROMETHAZINE HCL 25 MG/ML IJ SOLN: 6.2500 mg | INTRAMUSCULAR | Status: DC | PRN

## 2021-02-06 MED ORDER — BUPIVACAINE-EPINEPHRINE (PF) 0.25% -1:200000 IJ SOLN
INTRAMUSCULAR | Status: AC
  Filled 2021-02-06: qty 30

## 2021-02-06 MED ORDER — LACTATED RINGERS IV SOLN: INTRAVENOUS | Status: DC | PRN

## 2021-02-06 MED ORDER — CLINDAMYCIN PHOSPHATE 600 MG/50ML IV SOLN
INTRAVENOUS | Status: AC
  Filled 2021-02-06: qty 50

## 2021-02-06 MED ORDER — DEXAMETHASONE SODIUM PHOSPHATE 10 MG/ML IJ SOLN
INTRAMUSCULAR | Status: DC | PRN
  Administered 2021-02-06: 10 mg via INTRAVENOUS

## 2021-02-06 MED ORDER — BUPIVACAINE-EPINEPHRINE (PF) 0.25% -1:200000 IJ SOLN
INTRAMUSCULAR | Status: DC | PRN
  Administered 2021-02-06: 30 mL via PERINEURAL

## 2021-02-06 MED ORDER — PHENYLEPHRINE HCL (PRESSORS) 10 MG/ML IV SOLN
INTRAVENOUS | Status: DC | PRN
  Administered 2021-02-06 (×2): 50 ug via INTRAVENOUS
  Administered 2021-02-06 (×2): 100 ug via INTRAVENOUS
  Administered 2021-02-06: 50 ug via INTRAVENOUS

## 2021-02-06 MED ORDER — KETOROLAC TROMETHAMINE 30 MG/ML IJ SOLN
INTRAMUSCULAR | Status: AC
  Administered 2021-02-06: 30 mg via INTRAVENOUS
  Filled 2021-02-06: qty 1

## 2021-02-06 MED ORDER — MIDAZOLAM HCL 2 MG/2ML IJ SOLN
INTRAMUSCULAR | Status: DC | PRN
  Administered 2021-02-06 (×2): 1 mg via INTRAVENOUS

## 2021-02-06 MED ORDER — FENTANYL CITRATE (PF) 100 MCG/2ML IJ SOLN
INTRAMUSCULAR | Status: DC | PRN
  Administered 2021-02-06: 100 ug via INTRAVENOUS

## 2021-02-06 MED ORDER — ONDANSETRON HCL 4 MG/2ML IJ SOLN
INTRAMUSCULAR | Status: DC | PRN
  Administered 2021-02-06: 4 mg via INTRAVENOUS

## 2021-02-06 MED ORDER — CHLORHEXIDINE GLUCONATE 0.12 % MT SOLN
OROMUCOSAL | Status: AC
  Administered 2021-02-06: 15 mL via OROMUCOSAL
  Filled 2021-02-06: qty 15

## 2021-02-06 MED ORDER — 0.9 % SODIUM CHLORIDE (POUR BTL) OPTIME
TOPICAL | Status: DC | PRN
  Administered 2021-02-06: 500 mL

## 2021-02-06 MED ORDER — LIDOCAINE HCL (CARDIAC) PF 100 MG/5ML IV SOSY
PREFILLED_SYRINGE | INTRAVENOUS | Status: DC | PRN
  Administered 2021-02-06: 60 mg via INTRAVENOUS

## 2021-02-06 MED ORDER — FENTANYL CITRATE (PF) 100 MCG/2ML IJ SOLN
25.0000 ug | INTRAMUSCULAR | Status: DC | PRN
  Administered 2021-02-06: 50 ug via INTRAVENOUS
  Administered 2021-02-06: 25 ug via INTRAVENOUS

## 2021-02-06 SURGICAL SUPPLY — 20 items
BLADE CLIPPER SURG (BLADE) ×2 IMPLANT
BLADE SURG 15 STRL LF DISP TIS (BLADE) ×1 IMPLANT
BLADE SURG 15 STRL SS (BLADE) ×1
BNDG GAUZE 1X2.1 STRL (MISCELLANEOUS) ×2 IMPLANT
CHLORAPREP W/TINT 26 (MISCELLANEOUS) ×2 IMPLANT
DRAPE LAPAROTOMY 77X122 PED (DRAPES) ×2 IMPLANT
ELECT CAUTERY BLADE 6.4 (BLADE) ×2 IMPLANT
ELECT REM PT RETURN 9FT ADLT (ELECTROSURGICAL) ×2
ELECTRODE REM PT RTRN 9FT ADLT (ELECTROSURGICAL) ×1 IMPLANT
GAUZE 4X4 16PLY ~~LOC~~+RFID DBL (SPONGE) ×2 IMPLANT
GAUZE PACKING FOLDED 1IN STRL (GAUZE/BANDAGES/DRESSINGS) ×2 IMPLANT
GAUZE SPONGE 4X4 12PLY STRL (GAUZE/BANDAGES/DRESSINGS) ×2 IMPLANT
GLOVE SURG ENC MOIS LTX SZ7 (GLOVE) ×2 IMPLANT
GOWN STRL REUS W/ TWL LRG LVL3 (GOWN DISPOSABLE) ×2 IMPLANT
GOWN STRL REUS W/TWL LRG LVL3 (GOWN DISPOSABLE) ×2
MANIFOLD NEPTUNE II (INSTRUMENTS) ×2 IMPLANT
NEEDLE HYPO 22GX1.5 SAFETY (NEEDLE) ×2 IMPLANT
NS IRRIG 1000ML POUR BTL (IV SOLUTION) ×2 IMPLANT
PACK BASIN MINOR ARMC (MISCELLANEOUS) ×2 IMPLANT
SPONGE T-LAP 18X18 ~~LOC~~+RFID (SPONGE) ×2 IMPLANT

## 2021-02-06 NOTE — Op Note (Signed)
  02/06/2021  10:25 AM  PATIENT:  Anna Flores  30 y.o. female  PRE-OPERATIVE DIAGNOSIS:  Right breast abscess  POST-OPERATIVE DIAGNOSIS:  Same  PROCEDURE:   1. Incision and drainage of complex Right breast abscess 2. Excisional debridement of skin subcutaneous tissue  measuring 8 square centimeters    SURGEON:  Surgeon(s) and Role:    * Christophor Eick F, MD - Primary  EBL: 5cc  ANESTHESIA: LMA  FINDINGS:  Complex and large right breast abscess, about 10cc pus drained.  DICTATION:  Patient was explained about the  procedure in detail. Risks, benefits,  possible complications and a consent was obtained. The patient was taken to the operating room and placed in the supine position.  Right breast and chest wall was prepped and draped in the usual sterile fashion.  There was evidence of erythema and fluctuance between the nipple and areola located at 2:00.  Using a 15 blade knife I made an incision over the point of maximal fluctuance.  We drained 10 cc of pus and this was cultured.  Complex loculations that were broken down using finger fracturing.  A curette was used to debride the subcutaneous tissue and breast parenchyma.  Good hemostasis was obtained with electrocautery.  We irrigated the wound profusely.  We also decompressed and milked the right breast. ConfForm 1 inch packing was placed and a sterile dressing was placed.  Liposomal Marcaine  was injected around the wound site. Needle and laparotomy counts were correct and there were no immediate complications  Leafy Ro, MD

## 2021-02-06 NOTE — Transfer of Care (Signed)
Immediate Anesthesia Transfer of Care Note  Patient: Anna Flores  Procedure(s) Performed: INCISION AND DRAINAGE ABSCESS (Right)  Patient Location: PACU  Anesthesia Type:General  Level of Consciousness: drowsy  Airway & Oxygen Therapy: Patient Spontanous Breathing and Patient connected to face mask oxygen  Post-op Assessment: Report given to RN and Post -op Vital signs reviewed and stable  Post vital signs: Reviewed and stable  Last Vitals:  Vitals Value Taken Time  BP 117/76 02/06/21 1031  Temp    Pulse 92 02/06/21 1037  Resp 27 02/06/21 1037  SpO2 100 % 02/06/21 1037  Vitals shown include unvalidated device data.  Last Pain:  Vitals:   02/06/21 0845  TempSrc: Oral  PainSc: 8       Patients Stated Pain Goal: 6 (02/06/21 0747)  Complications: No notable events documented.

## 2021-02-06 NOTE — Anesthesia Postprocedure Evaluation (Signed)
Anesthesia Post Note  Patient: Anna Flores  Procedure(s) Performed: INCISION AND DRAINAGE ABSCESS (Right)  Patient location during evaluation: PACU Anesthesia Type: General Level of consciousness: awake and alert Pain management: pain level controlled Vital Signs Assessment: post-procedure vital signs reviewed and stable Respiratory status: spontaneous breathing, nonlabored ventilation and respiratory function stable Cardiovascular status: blood pressure returned to baseline and stable Postop Assessment: no apparent nausea or vomiting Anesthetic complications: no   No notable events documented.   Last Vitals:  Vitals:   02/06/21 1144 02/06/21 1240  BP: 122/74 110/64  Pulse: 82 78  Resp: 16 18  Temp: 36.6 C 36.8 C  SpO2: 95% 97%    Last Pain:  Vitals:   02/06/21 1240  TempSrc: Oral  PainSc: 3                  Karleen Hampshire

## 2021-02-06 NOTE — Anesthesia Procedure Notes (Signed)
Procedure Name: LMA Insertion Date/Time: 02/06/2021 9:42 AM Performed by: Henrietta Hoover, CRNA Pre-anesthesia Checklist: Patient identified, Patient being monitored, Timeout performed, Emergency Drugs available and Suction available Patient Re-evaluated:Patient Re-evaluated prior to induction Oxygen Delivery Method: Circle system utilized Preoxygenation: Pre-oxygenation with 100% oxygen Induction Type: IV induction Ventilation: Mask ventilation without difficulty LMA: LMA inserted LMA Size: 4.0 Number of attempts: 1 Placement Confirmation: positive ETCO2 and breath sounds checked- equal and bilateral Tube secured with: Tape Dental Injury: Teeth and Oropharynx as per pre-operative assessment

## 2021-02-06 NOTE — Anesthesia Preprocedure Evaluation (Addendum)
Anesthesia Evaluation  Patient identified by MRN, date of birth, ID band Patient awake    Reviewed: Allergy & Precautions, H&P , NPO status , Patient's Chart, lab work & pertinent test results  History of Anesthesia Complications Negative for: history of anesthetic complications  Airway Mallampati: II  TM Distance: >3 FB Neck ROM: full    Dental  (+) Teeth Intact   Pulmonary neg pulmonary ROS, neg sleep apnea, neg COPD,    breath sounds clear to auscultation       Cardiovascular (-) angina(-) Past MI and (-) Cardiac Stents negative cardio ROS  (-) dysrhythmias  Rhythm:regular Rate:Normal     Neuro/Psych PSYCHIATRIC DISORDERS Anxiety Depression negative neurological ROS     GI/Hepatic negative GI ROS, Neg liver ROS,   Endo/Other  negative endocrine ROS  Renal/GU      Musculoskeletal   Abdominal   Peds  Hematology  (+) Blood dyscrasia, anemia , Hgb 11.0   Anesthesia Other Findings Past Medical History: 10/26/2013: Abdominal pain 2013: Anemia No date: Anxiety 02/28/2020: Back pain affecting pregnancy in third trimester No date: Depression 10/02/2016: History of Papanicolaou smear of cervix     Comment:  NEG 02/29/2020: IDA (iron deficiency anemia) 03/07/2020: Indication for care in labor and delivery, antepartum 10/26/2013: Menometrorrhagia No date: Pregnancy     Comment:  DELIVERED 08/12/16 - JEG  Past Surgical History: 2018: DIAGNOSTIC LAPAROSCOPY     Comment:  suspected ectopic pregnancy-[facility] No date: TONSILLECTOMY  BMI    Body Mass Index: 33.05 kg/m      Reproductive/Obstetrics negative OB ROS                            Anesthesia Physical Anesthesia Plan  ASA: 2  Anesthesia Plan: General LMA   Post-op Pain Management:    Induction:   PONV Risk Score and Plan: Dexamethasone, Ondansetron, Midazolam and Treatment may vary due to age or medical condition  Airway  Management Planned:   Additional Equipment:   Intra-op Plan:   Post-operative Plan:   Informed Consent: I have reviewed the patients History and Physical, chart, labs and discussed the procedure including the risks, benefits and alternatives for the proposed anesthesia with the patient or authorized representative who has indicated his/her understanding and acceptance.     Dental Advisory Given  Plan Discussed with: Anesthesiologist, CRNA and Surgeon  Anesthesia Plan Comments:         Anesthesia Quick Evaluation

## 2021-02-06 NOTE — Progress Notes (Addendum)
[facility] SUR[facility]ICAL PROGRESS NOTE  Hospital Day(s): 3.   Interval History:  Patient seen and examined No acute events or new complaints overnight.  Patient reports that she continues to have soreness and discomfort around her areola on her right breast, erythema resolved, no fever/chills No new labs/imaging this morning She continues on IV clindamycin  Plan for I&D in the OR this afternoon with Dr Everlene Farrier  Vital signs in last 24 hours: [min-max] current  Temp:  [97.7 F (36.5 C)-98.8 F (37.1 C)] 97.7 F (36.5 C) (07/05 0345) Pulse Rate:  [70-80] 71 (07/05 0345) Resp:  [18] 18 (07/05 0345) BP: (96-114)/(55-73) 97/67 (07/05 0345) SpO2:  [97 %-99 %] 99 % (07/05 0345)     Height: 5\' 5"  (165.1 cm) Weight: 90.1 kg BMI (Calculated): 33.05   Intake/Output last 2 shifts:  07/04 0701 - 07/05 0700 In: 221.7 [I.V.:21.7; IV Piggyback:200] Out: -    Physical Exam:  Constitutional: alert, cooperative and no distress  Respiratory: breathing non-labored at rest  Cardiovascular: regular rate and sinus rhythm   Labs:  CBC Latest Ref Rng & Units 02/04/2021 02/03/2021 02/02/2021  WBC 4.0 - 10.5 K/uL 6.8 9.8 11.6(H)  Hemoglobin 12.0 - 15.0 g/dL 11.0(L) 11.9(L) 11.8(L)  Hematocrit 36.0 - 46.0 % 32.4(L) 34.5(L) 35.2(L)  Platelets 150 - 400 K/uL 291 307 341   CMP Latest Ref Rng & Units 02/04/2021 02/03/2021 02/02/2021  Glucose 70 - 99 mg/dL 04/05/2021) - 785(Y)  BUN 6 - 20 mg/dL 8 - 9  Creatinine 850(Y - 1.00 mg/dL 7.74 1.28 7.86  Sodium 135 - 145 mmol/L 137 - 138  Potassium 3.5 - 5.1 mmol/L 4.1 - 3.6  Chloride 98 - 111 mmol/L 103 - 102  CO2 22 - 32 mmol/L 28 - 25  Calcium 8.9 - 10.3 mg/dL 9.0 - 9.5  Total Protein 6.5 - 8.1 g/dL - - -  Total Bilirubin 0.3 - 1.2 mg/dL - - -  Alkaline Phos 38 - 126 U/L - - -  AST 15 - 41 U/L - - -  ALT 0 - 44 U/L - - -     Imaging studies: No new pertinent imaging studies   Assessment/Plan:  30 y.o. female with right breast abscess    - Plan for  incision and drainage in the OR this afternoon with Dr 26 pending OR/Anesthesia availability  - All risks, benefits, and alternatives to above procedure(s) were discussed with the patient, all of her questions were answered to her expressed satisfaction, patient expresses she wishes to proceed, and informed consent was obtained.   - NPO + IVF   - Continue IV Abx (Clindamycin) - Pain control prn  All of the above findings and recommendations were discussed with the patient, and the medical team, and all of patient's questions were answered to her expressed satisfaction.  -- Everlene Farrier, PA-C  [facility] 02/06/2021, 7:34 AM [phone] M-F: 7am - 4pm

## 2021-02-07 ENCOUNTER — Ambulatory Visit: Payer: Medicaid Other | Admitting: Surgery

## 2021-02-07 NOTE — Discharge Instructions (Addendum)
Dxycycline and Breastfeeding:  A close examination of available literature indicates that there is not likely to be harm in short-term use of doxycycline during lactation because milk levels are low and absorption by the infant is inhibited by the calcium in breast milk  -- Remember. You will be on a medication (Bromocriptine) which will inhibit breast milk production over the next month   Signs / Symptoms of Infection:  Fever >101F Severe redness Drainage which is thick and malodorous   Right Breast Dressing Change Instructions: Wash hands with soap. Put on gloves if desired.  Remove tape and 4x4 gauze. Remove "shoe string" packing from wound  Rinse with saline  Pack wound with "shoe string" packing using long Q-tip until you meet resistance Leave a few inches of "shoe string" packing loose out of wound for you to pull out next dressing change, cut  Place 4x4 gauze over, secure with paper tape  Local Mental Health Resources: [facility] [phone] [facility] Department [phone] [facility] Department of Psychology [facility]  [phone] [employer] [phone]

## 2021-02-07 NOTE — Progress Notes (Signed)
[facility] SUR[facility]ICAL PROGRESS NOTE  Hospital Day(s): 4.   Post op day(s): 1 Day Post-Op.   Interval History:  Patient seen and examined No acute events or new complaints overnight.  Patient reports she overall feels better compared to pre-operatively Continues to have mostly soreness at I&D site to the right breast No fever chills Cx from OR with NGTD She continues on IV clindamycin She would like one more day in the hospital for IV Abx and dressing change education    Vital signs in last 24 hours: [min-max] current  Temp:  [97.4 F (36.3 C)-98.8 F (37.1 C)] 98 F (36.7 C) (07/06 0715) Pulse Rate:  [64-92] 65 (07/06 0715) Resp:  [16-20] 17 (07/06 0715) BP: (102-129)/(62-76) 116/65 (07/06 0715) SpO2:  [94 %-100 %] 98 % (07/06 0715) Weight:  [90.1 kg] 90.1 kg (07/05 0845)     Height: 5\' 5"  (165.1 cm) Weight: 90.1 kg BMI (Calculated): 33.05   Intake/Output last 2 shifts:  07/05 0701 - 07/06 0700 In: 760 [P.O.:60; I.V.:550; IV Piggyback:150] Out: 3 [Blood:3]   Physical Exam:  Constitutional: alert, cooperative and no distress  Respiratory: breathing non-labored at rest  Integumentary: Chaperone present, right breast I&D to the 2 o'clock position in the areola medial to the nipple, previous erythema and induration resolved, drainage serosanguinous  Labs:  CBC Latest Ref Rng & Units 02/04/2021 02/03/2021 02/02/2021  WBC 4.0 - 10.5 K/uL 6.8 9.8 11.6(H)  Hemoglobin 12.0 - 15.0 g/dL 11.0(L) 11.9(L) 11.8(L)  Hematocrit 36.0 - 46.0 % 32.4(L) 34.5(L) 35.2(L)  Platelets 150 - 400 K/uL 291 307 341   CMP Latest Ref Rng & Units 02/04/2021 02/03/2021 02/02/2021  Glucose 70 - 99 mg/dL 04/05/2021) - 660(Y)  BUN 6 - 20 mg/dL 8 - 9  Creatinine 301(S - 1.00 mg/dL 0.10 9.32 3.55  Sodium 135 - 145 mmol/L 137 - 138  Potassium 3.5 - 5.1 mmol/L 4.1 - 3.6  Chloride 98 - 111 mmol/L 103 - 102  CO2 22 - 32 mmol/L 28 - 25  Calcium 8.9 - 10.3 mg/dL 9.0 - 9.5  Total Protein 6.5 - 8.1 g/dL - - -   Total Bilirubin 0.3 - 1.2 mg/dL - - -  Alkaline Phos 38 - 126 U/L - - -  AST 15 - 41 U/L - - -  ALT 0 - 44 U/L - - -     Imaging studies: No new pertinent imaging studies   Assessment/Plan:  30 y.o. female 1 Day Post-Op s/p incision and drainage for right breast abscess.   - Continue Local Wound Care: Pack wound daily with packing, cover with dry gauze, secure. I preformed this today. I will attempt to educate husband tomorrow as well  - Continue IV Abx (Clindamycin) while in house; transition to PO for home; follow up Cx   - Pain control prn   - Discharge Planning: Anticipate DC in AM after dressing teaching with family   All of the above findings and recommendations were discussed with the patient, and the medical team, and all of patient's questions were answered to her expressed satisfaction.  -- 26, PA-C [facility] 02/07/2021, 8:40 AM [phone] M-F: 7am - 4pm

## 2021-02-07 NOTE — Progress Notes (Addendum)
After speaking with the TOC contact, I clarified with Anna Flores that she would like to "find a therapist outpatient to talk with" instead of seeing a psychiatrist in the hospital regarding "having a history of PTSD, Anxiety, Depression with previous pregnancies and Bi-Polar." Will provide local resources for mental health for [facility] prior to discharge.

## 2021-02-07 NOTE — Progress Notes (Signed)
Spoke with Dr. Aleen Campi from General Surgery about pt's first packing/dressing change. They are to perform.
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
## 2021-02-08 LAB — CULTURE, BLOOD (SINGLE)
Culture: NO GROWTH
Special Requests: ADEQUATE

## 2021-02-08 MED ORDER — BROMOCRIPTINE MESYLATE 2.5 MG PO TABS: 2.5000 mg | ORAL_TABLET | Freq: Two times a day (BID) | ORAL | 0 refills | Status: DC

## 2021-02-08 MED ORDER — ONDANSETRON 4 MG PO TBDP: 4.0000 mg | ORAL_TABLET | Freq: Four times a day (QID) | ORAL | 1 refills | Status: DC | PRN

## 2021-02-08 NOTE — Discharge Summary (Signed)
[facility] SUR[facility] DISCHARGE SUMMARY  Patient ID: Anna Flores MRN: [id number] DOB/AGE: 03-Nov-1990 30 y.o.  Admit date: 02/03/2021 Discharge date: 02/08/2021  Discharge Diagnoses Patient Active Problem List   Diagnosis Date Noted   Breast abscess of female 02/03/2021    Consultants None  Procedures 02/06/2021:  1. Incision and drainage of complex Right breast abscess 2. Excisional debridement of skin subcutaneous tissue  measuring 8 square centimeters   HPI: This is a 30 year old woman who has been seen on several occasions by Dr. Sterling Big.  She had been referred to general surgery by Dr. Tiburcio Pea, OB/GYN after presenting to the emergency department with redness, pain, and swelling in her right breast.  She is about 10 months postpartum and is still breast-feeding her daughter.  An ultrasound performed in the emergency department on June 15 showed areas of complex cystic and solid processes and it was not felt that the breast required incision and drainage.  Bactrim was initiated.  Her symptoms worsened and apparently her right nipple began to invert.  She was admitted to the hospital on June 17 for mastitis and IV antibiotics.  Dr. Everlene Farrier saw her in consultation.  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  No evidence of necrotizing infection or abscess appreciated on CT. according to his consultation note, he discussed the patient's case with Dr. Connye Burkitt, breast radiology, and recommended that she undergo an ultrasound-guided aspiration of the galactocele the following week.  The patient apparently called the general surgery clinic requesting to have the aspiration performed sooner of then had been arranged in radiology.  Dr. Everlene Farrier saw her in clinic on January 29, 2021.  He aspirated 4 cc of milky fluid from the galactocele, which was sent for culture and sensitivities.  Microbiology showed only  mixed skin flora.  The day after her aspiration, the patient called the office again saying that she had a lot more pain and redness and swelling in the area.  She went to the ER at [facility] on June 29.  They did perform an ultrasound that showed a heterogeneous hypoechoic region measuring 3.7 x 2.9 x 3.7 cm underlying the nipple areolar complex.  There were no anechoic fluid pockets identified for drainage.  She was diagnosed with cellulitis and started on doxycycline.  She then saw OB/GYN on June 30.  Their note was reviewed and states "right breast is red over most of the tissue, with notable edema on the areola and nipple that appears yellowish.  The skin appears similar to peau d'orange."  Yesterday, she saw Dr. Everlene Farrier in clinic for worsening pain.  According to his note, the patient reported that the pain is even worse than before the aspiration.  She also reported a low-grade fever to him.  His note does document the presence of significant social and financial stressors for the patient as well as a fair amount of anxiety.  On his exam, there did not appear to be any significant change, no abscess or evidence of necrotizing infection.  He prescribed a combination of Xanax and gabapentin to help manage the anxiety component of her issue.  A change in antibiotic therapy was offered, but due to her financial concerns, she wished to complete the course of doxycycline that had been prescribed.  Late last evening, I received a phone call from our answering service indicating that she was continuing to have issues and their  triage decision making tree indicated that she should present to the emergency department.  She was seen early this morning her labs demonstrated a mild leukocytosis of 11.6.  Other labs were unremarkable.  An ultrasound was performed which showed a 3.3 x 5.3 x 5.3 cm area of complex echogenicity, suggestive of a subareolar hematoma and a superinfected hematoma was not excluded.  Due to her failure of  outpatient therapy, she is being admitted for IV antibiotics and potential incision and drainage.  Hospital Course: Informed consent was obtained and documented, and patient underwent uneventful incision and drainage of right breast abscess (Dr Everlene Farrier, 02/06/2021).  Post-operatively, patient's pain gradually improved. She did remain in the hospital for 1 day post-op for IV Abx and dressing education. The remainder of patient's hospital course was essentially unremarkable, and discharge planning was initiated accordingly with patient safely able to be discharged home with appropriate discharge instructions, antibiotics (she has 1 week Doxycycline at home), pain control, and outpatient follow-up after all of her questions were answered to her expressed satisfaction.   Discharge Condition: Good   Physical Examination:  Constitutional: Well appearing female, NAD Pulmonary: Normal effort, no respiratory distress Integumentary: Chaperone present, right breast I&D to the 2 o'clock position in the areola medial to the nipple, previous erythema and induration resolved, drainage serosanguinous   Allergies as of 02/08/2021       Reactions   Amoxicillin Hives   Hydrocodone Shortness Of Breath, Other (See Comments)   "loss of consciousness"; "felt really weird" when she was given 10 mgm hydrocodone. Did well with 5 mgm tablet   Vancomycin Itching   C/O head burning and itching   Reglan [metoclopramide] Other (See Comments)   Cherry Hives        Medication List     STOP taking these medications    ondansetron 8 MG tablet Commonly known as: ZOFRAN       TAKE these medications    acetaminophen 500 MG tablet Commonly known as: TYLENOL Take 1,000 mg by mouth every 6 (six) hours as needed.   bromocriptine 2.5 MG tablet Commonly known as: PARLODEL Take 1 tablet (2.5 mg total) by mouth 2 (two) times daily.   doxycycline 100 MG capsule Commonly known as: VIBRAMYCIN Take 100 mg by mouth 2  (two) times daily.   gabapentin 300 MG capsule Commonly known as: Neurontin Take 1 capsule (300 mg total) by mouth 3 (three) times daily.   ibuprofen 200 MG tablet Commonly known as: ADVIL Take 400 mg by mouth every 6 (six) hours as needed.   ondansetron 4 MG disintegrating tablet Commonly known as: ZOFRAN-ODT Take 1 tablet (4 mg total) by mouth every 6 (six) hours as needed for nausea.   oxyCODONE 5 MG immediate release tablet Commonly known as: Roxicodone Take 1 tablet (5 mg total) by mouth every 4 (four) hours as needed for severe pain.       ASK your doctor about these medications    ALPRAZolam 0.5 MG tablet Commonly known as: Xanax Take 1 tablet (0.5 mg total) by mouth 3 (three) times daily as needed for anxiety.   escitalopram 10 MG tablet Commonly known as: Lexapro Take 1 tablet (10 mg total) by mouth daily.               Discharge Care Instructions  (From admission, onward)           Start     Ordered   02/08/21 0000  Discharge wound care:  Comments: Complete Daily:  Remove previous dressing, wash hands and wear gloves, replace packing with supplies provided, cover and secure   02/08/21 0909              Follow-up Information     Donovan Kail, PA-C. Schedule an appointment as soon as possible for a visit in 2 week(s).   Specialty: Physician Assistant Why: s/p right breast I&D Contact information: [address] [place] [address] [phone]                  Time spent on discharge management including discussion of hospital course, clinical condition, outpatient instructions, prescriptions, and follow up with the patient and members of the medical team: >30 minutes  -- Lynden Oxford , PA-C [facility]  02/08/2021, 9:09 AM [phone] M-F: 7am - 4pm

## 2021-02-08 NOTE — Progress Notes (Signed)
Patient discharged home with husband. Discharge instructions, when to follow up, and prescriptions reviewed with patient and husband. Dressing change performed with patient and husband at bedside.Patient and husband verbalized understanding. Patient will be escorted out by auxiliary.

## 2021-02-09 ENCOUNTER — Telehealth: Payer: Self-pay | Admitting: Surgery

## 2021-02-09 NOTE — Telephone Encounter (Signed)
Pt's sister called concerned that the pt had surgery w/Dr. Everlene Farrier on 02/06/21 & d/c'd from [facility] on 02/08/21, thinking she would be able to deal w/the pain post op but now she is asking for some help to get her through, knowing she has 5 children @ home, as well. Lurena Joiner states she spoke w/someone in the hospital & was encouraged to make contact w/[facility] to request a referral for home health through her recovery.  Her concern is that she will miss the "window" that insurance would allow for coverage. She is aware the clinical team has left for the day/weekend, so her request will be routed directly to Dr. Everlene Farrier, however unsure what he will be able to do since the clinic is now closed.  Plz call back w/any updates, questions or concerns.  Thank you

## 2021-02-11 LAB — AEROBIC/ANAEROBIC CULTURE W GRAM STAIN (SURGICAL/DEEP WOUND): Culture: NO GROWTH

## 2021-02-12 ENCOUNTER — Telehealth: Payer: Self-pay | Admitting: *Deleted

## 2021-02-12 ENCOUNTER — Telehealth: Payer: Self-pay

## 2021-02-12 NOTE — Telephone Encounter (Signed)
Spoke with patient to let her know her insurance has been denied for home health care- she said her husband is doing a great job-she stated she will come to her appointment this Thursday for wound check.

## 2021-02-12 NOTE — Telephone Encounter (Signed)
Spoke with [place] at [facility]-unable to  take on the client at this time.   Spoke with Demetria -Faxing written order to [facility] at this time-they will review and let me know.[phone].

## 2021-02-12 NOTE — Telephone Encounter (Signed)
Patient does want to have home health care but she also wants to make sure that they will cover with her insurance (medicaid). Patient also wants to see if she can get a small dose of pain medication due to having some pain when she is packing the wound

## 2021-02-12 NOTE — Telephone Encounter (Signed)
Left message for patient to return call regarding home health care. Is patient still wanting to see about having home health care come to assist with wound care.

## 2021-02-12 NOTE — Telephone Encounter (Signed)
[facility] to [phone]. Spoke with [employer]. He will check on this and call me back. Spoke with patient to let her know someone would be in touch.  She is requesting a refill on 5 mg Roxicodone-Xanax has been extremely helpful  with dressing changes helpful and would like a refill. She is also taking Tylenol and Ibuprofen.

## 2021-02-13 ENCOUNTER — Other Ambulatory Visit: Payer: Self-pay

## 2021-02-13 ENCOUNTER — Other Ambulatory Visit: Payer: Self-pay | Admitting: Obstetrics

## 2021-02-13 ENCOUNTER — Telehealth: Payer: Self-pay | Admitting: Obstetrics

## 2021-02-13 DIAGNOSIS — N644 Mastodynia: Secondary | ICD-10-CM

## 2021-02-13 MED ORDER — OXYCODONE HCL 5 MG PO TABS: 5.0000 mg | ORAL_TABLET | ORAL | 0 refills | Status: DC | PRN

## 2021-02-13 NOTE — Telephone Encounter (Signed)
LMTC

## 2021-02-13 NOTE — Telephone Encounter (Signed)
Patient is calling for pain control refill and would like to speak with you. Please advise

## 2021-02-13 NOTE — Progress Notes (Signed)
Patient had a 7 day stay for a breast abcess. She has received no additional pain medication from the surgeon and requests a limited amount of  Roxicodone until she sees the surgeon again. Reviewing her hospt8ial record, she was given medication for anxiety and not given a refil on the roxi that I had prescribed for her. Have adivsed her to move toward regualr dosing f Tylenol and Ibuprofen, and I have sent in a limited amount of Roxi for her (16 tabs). She also strongly wants to continue nursing, and wsa placed on parlodel by the surgeon. I have advised her to contact [facility] in the morning and get a consultation appt with them. Also suggested she may stop the Parlodel for now. Mirna Mires, CNM  02/13/2021 6:18 PM

## 2021-02-13 NOTE — Telephone Encounter (Signed)
Patient called and stated that [facility] [place] [facility]

## 2021-02-13 NOTE — Telephone Encounter (Signed)
She needs to change her dressing on her surgery would and needs to take the pain medication first.

## 2021-02-14 NOTE — Telephone Encounter (Signed)
Called patient after office hours to discuss her pain medication. She is post I and D of a breast abcess and was not given pain medication upon her discharge. She requests an additional limited amount of roxicodone. I have e prescribed 16 tabs of Roxi for her. She does have a f/u appointment with the surgeon later this week. Mirna Mires, CNM  02/14/2021 11:46 AM

## 2021-02-15 ENCOUNTER — Other Ambulatory Visit: Payer: Self-pay

## 2021-02-15 ENCOUNTER — Encounter: Payer: Self-pay | Admitting: Physician Assistant

## 2021-02-15 ENCOUNTER — Ambulatory Visit (INDEPENDENT_AMBULATORY_CARE_PROVIDER_SITE_OTHER): Payer: Medicaid Other | Admitting: Physician Assistant

## 2021-02-15 VITALS — BP 118/75 | HR 66 | Temp 98.3°F | Ht 65.0 in | Wt 200.0 lb

## 2021-02-15 DIAGNOSIS — Z09 Encounter for follow-up examination after completed treatment for conditions other than malignant neoplasm: Secondary | ICD-10-CM

## 2021-02-15 DIAGNOSIS — O9279 Other disorders of lactation: Secondary | ICD-10-CM

## 2021-02-15 DIAGNOSIS — N611 Abscess of the breast and nipple: Secondary | ICD-10-CM

## 2021-02-15 MED ORDER — ALPRAZOLAM 0.5 MG PO TABS: 0.5000 mg | ORAL_TABLET | Freq: Three times a day (TID) | ORAL | 0 refills | Status: DC | PRN

## 2021-02-15 NOTE — Patient Instructions (Addendum)
Please call the office for any questions or concerns.  Wound check in 2 weeks

## 2021-02-15 NOTE — Progress Notes (Signed)
[facility] POST-OP OFFICE VISIT  02/15/2021  HPI: Anna Flores is a 30 y.o. female 9 days s/p incision and drainage for right breast abscess with Dr Everlene Farrier.   She continues to have right breast soreness; rated 6/10 She is alternating tylenol and motrin and saving her oxycodone for prior to dressing changes Finished doxycycline; Cx without growth  Xanax is helping her a lot; plans to seek psychiatric resources through her OB Husband is doing well with dressing changes at home  Vital signs: BP 118/75   Pulse 66   Temp 98.3 F (36.8 C) (Oral)   Ht 5\' 5"  (1.651 m)   Wt 200 lb (90.7 kg)   SpO2 98%   BMI 33.28 kg/m    Physical Exam: Constitutional: Well appearing female, NAD Skin: Chaperone present, right breast I&D to the 2 o'clock position in the areola medial to the nipple, expected induration, no erythema, no drainage, visible wound bed is 100% healthy granulation tissue.   Assessment/Plan: This is a 30 y.o. female 9 days s/p incision and drainage for right breast abscess   - I will refill her Xanax one last time; She plans to seek pyschiatric resources from her OB  - reviewed dressing changes; continue daily. They are doing a good job at home  - Continue current pain regimen; will try to wean from Gabapentin. She has narccotic Rx already   - She will rtc in 2 weeks for recheck   -- 26, PA-C [facility] 02/15/2021, 1:48 PM [phone] M-F: 7am - 4pm

## 2021-02-19 ENCOUNTER — Telehealth: Payer: Self-pay | Admitting: *Deleted

## 2021-02-19 DIAGNOSIS — Z09 Encounter for follow-up examination after completed treatment for conditions other than malignant neoplasm: Secondary | ICD-10-CM

## 2021-02-19 NOTE — Telephone Encounter (Signed)
Spoke with patient and she is requesting a refill on the Roxcodone . I spoke with Dr.Pabon- per Dr.Pabon no refill on the narcotic-she may use Tylenol and ibuprofen,. Also place referral to the pain clinic at this time. Patient aware that she will get a call from the pain clinic, however it may take several weeks before they call to schedule an appointment  due to being behind.

## 2021-02-19 NOTE — Telephone Encounter (Signed)
Patient called and stated she wants to get a refill on roxicodone. She stated that she is unable to pack her wound without taking pain medication. She [facility] [place]. Please call and advise

## 2021-02-24 ENCOUNTER — Other Ambulatory Visit: Payer: Self-pay | Admitting: Obstetrics

## 2021-02-24 DIAGNOSIS — F411 Generalized anxiety disorder: Secondary | ICD-10-CM

## 2021-02-26 ENCOUNTER — Telehealth: Payer: Self-pay | Admitting: Licensed Clinical Social Worker

## 2021-02-26 NOTE — Telephone Encounter (Signed)
Referred by Gavin Potters, Care Manger at [facility] due to depression and anxiety.

## 2021-02-28 ENCOUNTER — Encounter: Payer: Self-pay | Admitting: Physician Assistant

## 2021-02-28 ENCOUNTER — Ambulatory Visit (INDEPENDENT_AMBULATORY_CARE_PROVIDER_SITE_OTHER): Payer: Medicaid Other | Admitting: Physician Assistant

## 2021-02-28 ENCOUNTER — Other Ambulatory Visit: Payer: Self-pay

## 2021-02-28 VITALS — BP 107/74 | HR 71 | Temp 98.2°F | Ht 65.0 in | Wt 197.0 lb

## 2021-02-28 DIAGNOSIS — N611 Abscess of the breast and nipple: Secondary | ICD-10-CM

## 2021-02-28 DIAGNOSIS — O9279 Other disorders of lactation: Secondary | ICD-10-CM

## 2021-02-28 DIAGNOSIS — Z09 Encounter for follow-up examination after completed treatment for conditions other than malignant neoplasm: Secondary | ICD-10-CM

## 2021-02-28 NOTE — Progress Notes (Signed)
[facility] POST-OP OFFICE VISIT  02/28/2021  HPI: Anna Flores is a 30 y.o. female 18 days s/p incision and drainage for right breast abscess with Dr Everlene Farrier.  She is doing much better since the last visit The incision is completely healed, and she is no longer having any issues with pain reportedly No fever, chills, drainage She is doing superficial dressings as needed No new complaints  Vital signs: BP 107/74   Pulse 71   Temp 98.2 F (36.8 C)   Ht 5\' 5"  (1.651 m)   Wt 197 lb (89.4 kg)   SpO2 98%   BMI 32.78 kg/m    Physical Exam: Constitutional: Well appearing female, NAD Skin: Chaperone present, right breast I&D to the 2 o'clock position in the areola medial to the nipple is completely healed, there remains surrounding expected induration, no erythema or drainage     Assessment/Plan: This is a 30 y.o. female 18 days s/p incision and drainage for right breast abscess   - Continue superficial dressings as needed  - No further surgical issues  - She can follow up with 26 on as needed basis   -- [place], PA-C [facility] 02/28/2021, 10:48 AM [phone] M-F: 7am - 4pm

## 2021-02-28 NOTE — Patient Instructions (Signed)
You may use a dry gauze over the area as needed until it fully closes.   Follow-up with our office as needed.  Please call and ask to speak with a nurse if you develop questions or concerns.

## 2021-03-06 ENCOUNTER — Encounter: Payer: Self-pay | Admitting: Obstetrics

## 2021-03-06 ENCOUNTER — Ambulatory Visit (INDEPENDENT_AMBULATORY_CARE_PROVIDER_SITE_OTHER): Payer: Medicaid Other | Admitting: Obstetrics

## 2021-03-06 ENCOUNTER — Other Ambulatory Visit: Payer: Self-pay

## 2021-03-06 VITALS — BP 120/74 | Ht 65.0 in | Wt 201.0 lb

## 2021-03-06 DIAGNOSIS — F4312 Post-traumatic stress disorder, chronic: Secondary | ICD-10-CM | POA: Diagnosis not present

## 2021-03-06 DIAGNOSIS — F411 Generalized anxiety disorder: Secondary | ICD-10-CM

## 2021-03-06 MED ORDER — PAROXETINE HCL 10 MG PO TABS: 10.0000 mg | ORAL_TABLET | Freq: Every day | ORAL | 1 refills | Status: DC
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
## 2021-03-23 ENCOUNTER — Encounter: Payer: Self-pay | Admitting: Oncology

## 2021-03-28 ENCOUNTER — Other Ambulatory Visit: Payer: Self-pay | Admitting: Obstetrics

## 2021-03-28 DIAGNOSIS — F411 Generalized anxiety disorder: Secondary | ICD-10-CM

## 2021-03-30 ENCOUNTER — Encounter: Payer: Self-pay | Admitting: Oncology

## 2021-03-30 ENCOUNTER — Ambulatory Visit (LOCAL_COMMUNITY_HEALTH_CENTER): Payer: Self-pay | Admitting: Nurse Practitioner

## 2021-03-30 ENCOUNTER — Other Ambulatory Visit: Payer: Self-pay

## 2021-03-30 DIAGNOSIS — Z111 Encounter for screening for respiratory tuberculosis: Secondary | ICD-10-CM

## 2021-03-30 NOTE — Progress Notes (Signed)
30 year old female in clinic today for a PPD placement. Nimsi Males, RN  

## 2021-04-02 ENCOUNTER — Other Ambulatory Visit: Payer: Self-pay

## 2021-04-02 ENCOUNTER — Ambulatory Visit (LOCAL_COMMUNITY_HEALTH_CENTER): Payer: Medicaid Other

## 2021-04-02 DIAGNOSIS — Z111 Encounter for screening for respiratory tuberculosis: Secondary | ICD-10-CM

## 2021-04-02 LAB — TB SKIN TEST
Induration: 0 mm
TB Skin Test: NEGATIVE

## 2021-04-03 ENCOUNTER — Ambulatory Visit: Payer: Medicaid Other | Admitting: Obstetrics

## 2021-04-21 DIAGNOSIS — S53402A Unspecified sprain of left elbow, initial encounter: Secondary | ICD-10-CM | POA: Diagnosis not present

## 2021-04-21 DIAGNOSIS — S46212A Strain of muscle, fascia and tendon of other parts of biceps, left arm, initial encounter: Secondary | ICD-10-CM | POA: Diagnosis not present

## 2021-04-24 ENCOUNTER — Encounter: Payer: Self-pay | Admitting: General Surgery

## 2021-05-17 ENCOUNTER — Telehealth: Payer: Self-pay

## 2021-05-17 NOTE — Telephone Encounter (Signed)
Pt calling; just found out she is pregnant; is Bipolar and is on four different medications from her new psychiatrist who told her to stop all of them except one; Is there an OB provider that can help her find an alternative to this medication she can take while being preg so she doesn't have to risk her mental health?  [phone]  Pt states she is on lamictal which is the one her psychiatrist wants her to stay on, xanax 0.5mg  takes only half, vraylar and seroquel.  Pt is afraid to stop these cold [place] d/t withdrawal sxs.  Pt asked psychiatrist about titrating off of them and he said the affects on the fetus isn't worth it in his opinion.  Pt wants our opinion as she is afraid of withdrawal sxs.

## 2021-05-19 NOTE — Telephone Encounter (Signed)
None of use manage bipolar medication so those would be managed by her psychiatrist wether she is pregnant or not

## 2021-05-21 NOTE — Telephone Encounter (Signed)
Pt aware; states psychiatrist adv her to stop everything cold [place] and she is afraid to do that d/t her mental health.  Adv her to call him and let him know her concerns and really question him.  Pt also asked about right side back pain that radiates to the front; is 6wks; adv not due to preg as preg is still very small; pt states had miscarriage in 2020.  Adv to take e.s. tylenol two q6hrs and apply heat our of the hour; push water/fluids.

## 2021-06-11 ENCOUNTER — Emergency Department: Payer: Medicaid Other

## 2021-06-11 ENCOUNTER — Observation Stay
Admission: EM | Admit: 2021-06-11 | Discharge: 2021-06-12 | Disposition: A | Discharge status: Home / Self Care | Payer: Medicaid Other | Attending: Obstetrics and Gynecology | Admitting: Obstetrics and Gynecology

## 2021-06-11 ENCOUNTER — Encounter: Payer: Medicaid Other | Admitting: Advanced Practice Midwife

## 2021-06-11 ENCOUNTER — Ambulatory Visit: Payer: Self-pay

## 2021-06-11 ENCOUNTER — Other Ambulatory Visit: Payer: Self-pay

## 2021-06-11 ENCOUNTER — Emergency Department: Payer: Medicaid Other | Admitting: Anesthesiology

## 2021-06-11 ENCOUNTER — Encounter
Admission: EM | Disposition: A | Discharge status: Home / Self Care | Payer: Self-pay | Source: Home / Self Care | Attending: Emergency Medicine

## 2021-06-11 ENCOUNTER — Encounter: Payer: Self-pay | Admitting: Oncology

## 2021-06-11 DIAGNOSIS — R1031 Right lower quadrant pain: Secondary | ICD-10-CM

## 2021-06-11 DIAGNOSIS — R109 Unspecified abdominal pain: Secondary | ICD-10-CM | POA: Diagnosis not present

## 2021-06-11 DIAGNOSIS — O039 Complete or unspecified spontaneous abortion without complication: Secondary | ICD-10-CM | POA: Diagnosis not present

## 2021-06-11 DIAGNOSIS — O021 Missed abortion: Secondary | ICD-10-CM

## 2021-06-11 DIAGNOSIS — O209 Hemorrhage in early pregnancy, unspecified: Secondary | ICD-10-CM

## 2021-06-11 DIAGNOSIS — O3680X0 Pregnancy with inconclusive fetal viability, not applicable or unspecified: Secondary | ICD-10-CM

## 2021-06-11 DIAGNOSIS — N12 Tubulo-interstitial nephritis, not specified as acute or chronic: Secondary | ICD-10-CM | POA: Diagnosis not present

## 2021-06-11 DIAGNOSIS — Z6834 Body mass index (BMI) 34.0-34.9, adult: Secondary | ICD-10-CM | POA: Diagnosis not present

## 2021-06-11 DIAGNOSIS — N719 Inflammatory disease of uterus, unspecified: Secondary | ICD-10-CM | POA: Diagnosis present

## 2021-06-11 DIAGNOSIS — N10 Acute tubulo-interstitial nephritis: Secondary | ICD-10-CM | POA: Diagnosis not present

## 2021-06-11 DIAGNOSIS — Z20822 Contact with and (suspected) exposure to covid-19: Secondary | ICD-10-CM | POA: Insufficient documentation

## 2021-06-11 DIAGNOSIS — Z3A00 Weeks of gestation of pregnancy not specified: Secondary | ICD-10-CM | POA: Diagnosis not present

## 2021-06-11 DIAGNOSIS — D509 Iron deficiency anemia, unspecified: Secondary | ICD-10-CM | POA: Diagnosis not present

## 2021-06-11 DIAGNOSIS — O034 Incomplete spontaneous abortion without complication: Secondary | ICD-10-CM | POA: Diagnosis not present

## 2021-06-11 DIAGNOSIS — Z79899 Other long term (current) drug therapy: Secondary | ICD-10-CM | POA: Insufficient documentation

## 2021-06-11 DIAGNOSIS — O26891 Other specified pregnancy related conditions, first trimester: Secondary | ICD-10-CM | POA: Diagnosis not present

## 2021-06-11 DIAGNOSIS — D649 Anemia, unspecified: Secondary | ICD-10-CM | POA: Diagnosis not present

## 2021-06-11 DIAGNOSIS — F411 Generalized anxiety disorder: Secondary | ICD-10-CM | POA: Insufficient documentation

## 2021-06-11 DIAGNOSIS — R1030 Lower abdominal pain, unspecified: Secondary | ICD-10-CM | POA: Diagnosis present

## 2021-06-11 HISTORY — PX: DILATION AND EVACUATION: SHX1459

## 2021-06-11 HISTORY — PX: LAPAROSCOPY: SHX197

## 2021-06-11 LAB — CBC
HCT: 38.2 % (ref 36.0–46.0)
Hemoglobin: 12.5 g/dL (ref 12.0–15.0)
MCH: 28.5 pg (ref 26.0–34.0)
MCHC: 32.7 g/dL (ref 30.0–36.0)
MCV: 87.2 fL (ref 80.0–100.0)
Platelets: 305 10*3/uL (ref 150–400)
RBC: 4.38 MIL/uL (ref 3.87–5.11)
RDW: 13 % (ref 11.5–15.5)
WBC: 11.3 10*3/uL — ABNORMAL HIGH (ref 4.0–10.5)
nRBC: 0 % (ref 0.0–0.2)

## 2021-06-11 LAB — BASIC METABOLIC PANEL
Anion gap: 8 (ref 5–15)
BUN: 7 mg/dL (ref 6–20)
CO2: 26 mmol/L (ref 22–32)
Calcium: 9.3 mg/dL (ref 8.9–10.3)
Chloride: 102 mmol/L (ref 98–111)
Creatinine, Ser: 0.79 mg/dL (ref 0.44–1.00)
GFR, Estimated: >60 mL/min (ref >60)
Glucose, Bld: 120 mg/dL — ABNORMAL HIGH (ref 70–99)
Potassium: 3.5 mmol/L (ref 3.5–5.1)
Sodium: 136 mmol/L (ref 135–145)

## 2021-06-11 LAB — CHLAMYDIA/NGC RT PCR (ARMC ONLY)
Chlamydia Tr: NOT DETECTED
N gonorrhoeae: NOT DETECTED

## 2021-06-11 LAB — URINALYSIS, ROUTINE W REFLEX MICROSCOPIC
Bilirubin Urine: NEGATIVE
Glucose, UA: NEGATIVE mg/dL
Ketones, ur: NEGATIVE mg/dL
Nitrite: NEGATIVE
Protein, ur: NEGATIVE mg/dL
Specific Gravity, Urine: 1.009 (ref 1.005–1.030)
WBC, UA: >50 WBC/hpf — ABNORMAL HIGH (ref 0–5)
pH: 7 (ref 5.0–8.0)

## 2021-06-11 LAB — RESP PANEL BY RT-PCR (FLU A&B, COVID) ARPGX2
Influenza A by PCR: NEGATIVE
Influenza B by PCR: NEGATIVE
SARS Coronavirus 2 by RT PCR: NEGATIVE

## 2021-06-11 LAB — HCG, QUANTITATIVE, PREGNANCY: hCG, Beta Chain, Quant, S: 7194 m[IU]/mL — ABNORMAL HIGH (ref <5)

## 2021-06-11 LAB — POC URINE PREG, ED: Preg Test, Ur: POSITIVE — AB

## 2021-06-11 SURGERY — LAPAROSCOPY, DIAGNOSTIC
Anesthesia: General

## 2021-06-11 MED ORDER — SIMETHICONE 80 MG PO CHEW: 80.0000 mg | CHEWABLE_TABLET | Freq: Four times a day (QID) | ORAL | Status: DC | PRN

## 2021-06-11 MED ORDER — ROCURONIUM BROMIDE 100 MG/10ML IV SOLN
INTRAVENOUS | Status: DC | PRN
  Administered 2021-06-11: 40 mg via INTRAVENOUS
  Administered 2021-06-11: 20 mg via INTRAVENOUS

## 2021-06-11 MED ORDER — HYDROMORPHONE HCL 1 MG/ML IJ SOLN: 0.5000 mg | INTRAMUSCULAR | Status: DC | PRN

## 2021-06-11 MED ORDER — ONDANSETRON HCL 4 MG/2ML IJ SOLN
INTRAMUSCULAR | Status: DC | PRN
  Administered 2021-06-11: 4 mg via INTRAVENOUS

## 2021-06-11 MED ORDER — ONDANSETRON HCL 4 MG/2ML IJ SOLN
INTRAMUSCULAR | Status: AC
  Administered 2021-06-12: 4 mg
  Filled 2021-06-11: qty 2

## 2021-06-11 MED ORDER — OXYCODONE HCL 5 MG PO TABS
ORAL_TABLET | ORAL | Status: AC
  Administered 2021-06-11: 5 mg via ORAL
  Filled 2021-06-11: qty 1

## 2021-06-11 MED ORDER — ONDANSETRON HCL 4 MG PO TABS: 4.0000 mg | ORAL_TABLET | Freq: Four times a day (QID) | ORAL | Status: DC | PRN

## 2021-06-11 MED ORDER — ONDANSETRON HCL 4 MG/2ML IJ SOLN
4.0000 mg | Freq: Once | INTRAMUSCULAR | Status: AC
  Administered 2021-06-11: 4 mg via INTRAVENOUS
  Filled 2021-06-11: qty 2

## 2021-06-11 MED ORDER — LACTATED RINGERS IV SOLN: INTRAVENOUS | Status: DC

## 2021-06-11 MED ORDER — SODIUM CHLORIDE 0.9 % IV SOLN
100.0000 mg | Freq: Once | INTRAVENOUS | Status: AC
  Administered 2021-06-11: 100 mg via INTRAVENOUS
  Filled 2021-06-11: qty 100

## 2021-06-11 MED ORDER — IBUPROFEN 600 MG PO TABS
600.0000 mg | ORAL_TABLET | Freq: Four times a day (QID) | ORAL | Status: DC
  Administered 2021-06-12 (×3): 600 mg via ORAL
  Filled 2021-06-11 (×2): qty 1

## 2021-06-11 MED ORDER — FENTANYL CITRATE (PF) 100 MCG/2ML IJ SOLN
25.0000 ug | INTRAMUSCULAR | Status: DC | PRN
  Administered 2021-06-11 (×2): 25 ug via INTRAVENOUS

## 2021-06-11 MED ORDER — DEXAMETHASONE SODIUM PHOSPHATE 10 MG/ML IJ SOLN
INTRAMUSCULAR | Status: DC | PRN
  Administered 2021-06-11: 8 mg via INTRAVENOUS

## 2021-06-11 MED ORDER — BUPIVACAINE HCL 0.5 % IJ SOLN
INTRAMUSCULAR | Status: DC | PRN
  Administered 2021-06-11: 7 mL

## 2021-06-11 MED ORDER — OXYCODONE HCL 5 MG PO TABS: 5.0000 mg | ORAL_TABLET | Freq: Once | ORAL | Status: AC | PRN

## 2021-06-11 MED ORDER — SILVER NITRATE-POT NITRATE 75-25 % EX MISC
CUTANEOUS | Status: DC | PRN
  Administered 2021-06-11: 2

## 2021-06-11 MED ORDER — PROMETHAZINE HCL 25 MG/ML IJ SOLN
6.2500 mg | INTRAMUSCULAR | Status: DC | PRN
  Administered 2021-06-11: 6.25 mg via INTRAVENOUS

## 2021-06-11 MED ORDER — SODIUM CHLORIDE 0.9 % IV SOLN
2.0000 g | Freq: Two times a day (BID) | INTRAVENOUS | Status: DC
  Administered 2021-06-11: 2 g via INTRAVENOUS
  Filled 2021-06-11 (×3): qty 2

## 2021-06-11 MED ORDER — DOCUSATE SODIUM 100 MG PO CAPS
100.0000 mg | ORAL_CAPSULE | Freq: Two times a day (BID) | ORAL | Status: DC
  Administered 2021-06-12: 100 mg via ORAL
  Filled 2021-06-11: qty 1

## 2021-06-11 MED ORDER — MENTHOL 3 MG MT LOZG
1.0000 | LOZENGE | OROMUCOSAL | Status: DC | PRN
  Filled 2021-06-11 (×2): qty 9

## 2021-06-11 MED ORDER — PROMETHAZINE HCL 25 MG/ML IJ SOLN
INTRAMUSCULAR | Status: AC
  Filled 2021-06-11: qty 1

## 2021-06-11 MED ORDER — CEFAZOLIN SODIUM-DEXTROSE 2-4 GM/100ML-% IV SOLN
2.0000 g | Freq: Once | INTRAVENOUS | Status: AC
  Administered 2021-06-12: 2 g via INTRAVENOUS
  Filled 2021-06-11: qty 100

## 2021-06-11 MED ORDER — ACETAMINOPHEN 10 MG/ML IV SOLN
INTRAVENOUS | Status: AC
  Administered 2021-06-11: 1000 mg via INTRAVENOUS
  Filled 2021-06-11: qty 100

## 2021-06-11 MED ORDER — LACTATED RINGERS IV SOLN: INTRAVENOUS | Status: DC | PRN

## 2021-06-11 MED ORDER — FENTANYL CITRATE (PF) 100 MCG/2ML IJ SOLN
INTRAMUSCULAR | Status: AC
  Administered 2021-06-11: 25 ug via INTRAVENOUS
  Filled 2021-06-11: qty 2

## 2021-06-11 MED ORDER — PROPOFOL 10 MG/ML IV BOLUS
INTRAVENOUS | Status: DC | PRN
  Administered 2021-06-11: 200 mg via INTRAVENOUS

## 2021-06-11 MED ORDER — OXYCODONE-ACETAMINOPHEN 5-325 MG PO TABS
1.0000 | ORAL_TABLET | Freq: Four times a day (QID) | ORAL | Status: DC | PRN
  Administered 2021-06-12 (×2): 1 via ORAL
  Filled 2021-06-11 (×2): qty 1

## 2021-06-11 MED ORDER — LAMOTRIGINE 100 MG PO TABS
100.0000 mg | ORAL_TABLET | Freq: Every day | ORAL | Status: DC
  Filled 2021-06-11 (×2): qty 1

## 2021-06-11 MED ORDER — SUGAMMADEX SODIUM 200 MG/2ML IV SOLN
INTRAVENOUS | Status: DC | PRN
  Administered 2021-06-11: 200 mg via INTRAVENOUS

## 2021-06-11 MED ORDER — MORPHINE SULFATE (PF) 4 MG/ML IV SOLN
4.0000 mg | Freq: Once | INTRAVENOUS | Status: AC
  Administered 2021-06-11: 4 mg via INTRAVENOUS
  Filled 2021-06-11: qty 1

## 2021-06-11 MED ORDER — MIDAZOLAM HCL 2 MG/2ML IJ SOLN
INTRAMUSCULAR | Status: DC | PRN
  Administered 2021-06-11: 2 mg via INTRAVENOUS

## 2021-06-11 MED ORDER — SODIUM CHLORIDE 0.9 % IV BOLUS
1000.0000 mL | Freq: Once | INTRAVENOUS | Status: AC
  Administered 2021-06-11: 1000 mL via INTRAVENOUS

## 2021-06-11 MED ORDER — LIDOCAINE HCL (CARDIAC) PF 100 MG/5ML IV SOSY
PREFILLED_SYRINGE | INTRAVENOUS | Status: DC | PRN
  Administered 2021-06-11: 50 mg via INTRAVENOUS

## 2021-06-11 MED ORDER — OXYCODONE HCL 5 MG/5ML PO SOLN: 5.0000 mg | Freq: Once | ORAL | Status: AC | PRN

## 2021-06-11 MED ORDER — FENTANYL CITRATE (PF) 100 MCG/2ML IJ SOLN
INTRAMUSCULAR | Status: DC | PRN
  Administered 2021-06-11: 100 ug via INTRAVENOUS

## 2021-06-11 MED ORDER — ONDANSETRON HCL 4 MG/2ML IJ SOLN
4.0000 mg | Freq: Four times a day (QID) | INTRAMUSCULAR | Status: DC | PRN
  Administered 2021-06-12: 4 mg via INTRAVENOUS
  Filled 2021-06-11: qty 2

## 2021-06-11 MED ORDER — HYDROMORPHONE HCL 1 MG/ML IJ SOLN
0.5000 mg | Freq: Once | INTRAMUSCULAR | Status: AC
  Administered 2021-06-11: 0.5 mg via INTRAVENOUS
  Filled 2021-06-11: qty 1

## 2021-06-11 MED ORDER — ACETAMINOPHEN 10 MG/ML IV SOLN: 1000.0000 mg | Freq: Once | INTRAVENOUS | Status: DC | PRN

## 2021-06-11 MED ORDER — CARIPRAZINE HCL 1.5 MG PO CAPS
1.5000 mg | ORAL_CAPSULE | Freq: Every day | ORAL | Status: DC
  Filled 2021-06-11: qty 1

## 2021-06-11 SURGICAL SUPPLY — 63 items
ANCHOR TIS RET SYS 235ML (MISCELLANEOUS) IMPLANT
APPLICATOR ARISTA FLEXITIP XL (MISCELLANEOUS) IMPLANT
BAG URINE DRAIN 2000ML AR STRL (UROLOGICAL SUPPLIES) IMPLANT
BLADE SURG SZ11 CARB STEEL (BLADE) ×2 IMPLANT
CATH FOLEY 2WAY  5CC 16FR (CATHETERS) ×1
CATH URTH 16FR FL 2W BLN LF (CATHETERS) ×1 IMPLANT
CHLORAPREP W/TINT 26 (MISCELLANEOUS) ×4 IMPLANT
DERMABOND ADVANCED (GAUZE/BANDAGES/DRESSINGS) ×1
DERMABOND ADVANCED .7 DNX12 (GAUZE/BANDAGES/DRESSINGS) ×1 IMPLANT
DRAPE 3/4 80X56 (DRAPES) ×2 IMPLANT
DRAPE LEGGINS SURG 28X43 STRL (DRAPES) ×2 IMPLANT
DRAPE UNDER BUTTOCK W/FLU (DRAPES) ×2 IMPLANT
DRSG TELFA 3X8 NADH (GAUZE/BANDAGES/DRESSINGS) ×4 IMPLANT
ELECT REM PT RETURN 9FT ADLT (ELECTROSURGICAL) ×2
ELECTRODE REM PT RTRN 9FT ADLT (ELECTROSURGICAL) ×1 IMPLANT
FILTER UTR ASPR SPEC (MISCELLANEOUS) IMPLANT
FLTR UTR ASPR SPEC (MISCELLANEOUS)
GAUZE 4X4 16PLY ~~LOC~~+RFID DBL (SPONGE) ×2 IMPLANT
GLOVE SURG ENC MOIS LTX SZ7 (GLOVE) ×6 IMPLANT
GLOVE SURG UNDER POLY LF SZ7.5 (GLOVE) ×6 IMPLANT
GOWN STRL REUS W/ TWL LRG LVL3 (GOWN DISPOSABLE) ×3 IMPLANT
GOWN STRL REUS W/TWL LRG LVL3 (GOWN DISPOSABLE) ×3
GRASPER SUT TROCAR 14GX15 (MISCELLANEOUS) IMPLANT
HEMOSTAT ARISTA ABSORB 3G PWDR (HEMOSTASIS) IMPLANT
IRRIGATION STRYKERFLOW (MISCELLANEOUS) IMPLANT
IRRIGATOR STRYKERFLOW (MISCELLANEOUS)
IV LACTATED RINGERS 1000ML (IV SOLUTION) IMPLANT
KIT BERKELEY 1ST TRIMESTER 3/8 (MISCELLANEOUS) IMPLANT
KIT PINK PAD W/HEAD ARE REST (MISCELLANEOUS) ×2 IMPLANT
KIT PINK PAD W/HEAD ARM REST (MISCELLANEOUS) ×1 IMPLANT
KIT TURNOVER CYSTO (KITS) IMPLANT
LABEL OR SOLS (LABEL) ×2 IMPLANT
LIGASURE VESSEL 5MM BLUNT TIP (ELECTROSURGICAL) IMPLANT
MANIFOLD NEPTUNE II (INSTRUMENTS) IMPLANT
MANIPULATOR UTERINE 4.5 ZUMI (MISCELLANEOUS) IMPLANT
NEEDLE HYPO 22GX1.5 SAFETY (NEEDLE) ×2 IMPLANT
NS IRRIG 500ML POUR BTL (IV SOLUTION) ×2 IMPLANT
PACK DNC HYST (MISCELLANEOUS) ×2 IMPLANT
PACK LAP CHOLECYSTECTOMY (MISCELLANEOUS) ×2 IMPLANT
PAD OB MATERNITY 4.3X12.25 (PERSONAL CARE ITEMS) ×2 IMPLANT
PAD PREP 24X41 OB/GYN DISP (PERSONAL CARE ITEMS) ×2 IMPLANT
SCISSORS METZENBAUM CVD 33 (INSTRUMENTS) IMPLANT
SCRUB EXIDINE 4% CHG 4OZ (MISCELLANEOUS) ×2 IMPLANT
SET BERKELEY SUCTION TUBING (SUCTIONS) IMPLANT
SET TUBE SMOKE EVAC HIGH FLOW (TUBING) ×2 IMPLANT
SHEARS HARMONIC ACE PLUS 36CM (ENDOMECHANICALS) IMPLANT
SLEEVE ENDOPATH XCEL 5M (ENDOMECHANICALS) ×2 IMPLANT
SOL PREP PVP 2OZ (MISCELLANEOUS) ×2
SOLUTION PREP PVP 2OZ (MISCELLANEOUS) ×1 IMPLANT
SURGILUBE 2OZ TUBE FLIPTOP (MISCELLANEOUS) ×2 IMPLANT
SUT MNCRL 4-0 (SUTURE) ×1
SUT MNCRL 4-0 27XMFL (SUTURE) ×1
SUT VIC AB 2-0 UR6 27 (SUTURE) IMPLANT
SUT VICRYL 0 AB UR-6 (SUTURE) IMPLANT
SUTURE MNCRL 4-0 27XMF (SUTURE) ×1 IMPLANT
TOWEL OR 17X26 4PK STRL BLUE (TOWEL DISPOSABLE) IMPLANT
TROCAR ENDO BLADELESS 11MM (ENDOMECHANICALS) IMPLANT
TROCAR XCEL NON-BLD 5MMX100MML (ENDOMECHANICALS) ×2 IMPLANT
TROCAR XCEL UNIV SLVE 11M 100M (ENDOMECHANICALS) IMPLANT
VACURETTE 10 RIGID CVD (CANNULA) IMPLANT
VACURETTE 12 RIGID CVD (CANNULA) IMPLANT
VACURETTE 8 RIGID CVD (CANNULA) IMPLANT
WATER STERILE IRR 500ML POUR (IV SOLUTION) ×2 IMPLANT

## 2021-06-11 NOTE — Telephone Encounter (Signed)
Pt. Started having right sided back pain 2 days ago. Radiates into abdomen and groin. Has seen blood after urinating. Having painful urination as well. Concerned she may have a kidney stone. Instructed to go to ED. Verbalizes understanding.    Reason for Disposition  Patient sounds very sick or weak to the triager  Answer Assessment - Initial Assessment Questions 1. ONSET: "When did the pain begin?"      2 days ago 2. LOCATION: "Where does it hurt?" (upper, mid or lower back)     Right side of back and into abdomen 3. SEVERITY: "How bad is the pain?"  (e.g., Scale 1-10; mild, moderate, or severe)   - MILD (1-3): doesn't interfere with normal activities    - MODERATE (4-7): interferes with normal activities or awakens from sleep    - SEVERE (8-10): excruciating pain, unable to do any normal activities      Now - 8 4. PATTERN: "Is the pain constant?" (e.g., yes, no; constant, intermittent)      Constant 5. RADIATION: "Does the pain shoot into your legs or elsewhere?"     Groin 6. CAUSE:  "What do you think is causing the back pain?"      Maybe kidney stone 7. BACK OVERUSE:  "Any recent lifting of heavy objects, strenuous work or exercise?"     No 8. MEDICATIONS: "What have you taken so far for the pain?" (e.g., nothing, acetaminophen, NSAIDS)     Motrin 9. NEUROLOGIC SYMPTOMS: "Do you have any weakness, numbness, or problems with bowel/bladder control?"     No 10. OTHER SYMPTOMS: "Do you have any other symptoms?" (e.g., fever, abdominal pain, burning with urination, blood in urine)       Yes 11. PREGNANCY: "Is there any chance you are pregnant?" (e.g., yes, no; LMP)       No  Protocols used: Back Pain-A-AH

## 2021-06-11 NOTE — Anesthesia Procedure Notes (Signed)
Procedure Name: Intubation Date/Time: 06/11/2021 9:06 PM Performed by: Jaye Beagle, CRNA Pre-anesthesia Checklist: Patient identified, Emergency Drugs available, Suction available and Patient being monitored Patient Re-evaluated:Patient Re-evaluated prior to induction Oxygen Delivery Method: Circle system utilized Preoxygenation: Pre-oxygenation with 100% oxygen Induction Type: IV induction Ventilation: Mask ventilation without difficulty Laryngoscope Size: McGraph and 3 Grade View: Grade I Tube type: Oral Tube size: 7.0 mm Number of attempts: 1 Airway Equipment and Method: Stylet and Oral airway Placement Confirmation: ETT inserted through vocal cords under direct vision, positive ETCO2 and breath sounds checked- equal and bilateral Secured at: 20 cm Tube secured with: Tape Dental Injury: Teeth and Oropharynx as per pre-operative assessment

## 2021-06-11 NOTE — Transfer of Care (Signed)
Immediate Anesthesia Transfer of Care Note  Patient: Anna Flores  Procedure(s) Performed: LAPAROSCOPY DIAGNOSTIC D & C  Patient Location: PACU  Anesthesia Type:General  Level of Consciousness: drowsy  Airway & Oxygen Therapy: Patient Spontanous Breathing and Patient connected to face mask oxygen  Post-op Assessment: Report given to RN  Post vital signs: stable  Last Vitals:  Vitals Value Taken Time  BP 100/49 06/11/21 2217  Temp    Pulse 100 06/11/21 2222  Resp 25 06/11/21 2222  SpO2 100 % 06/11/21 2222  Vitals shown include unvalidated device data.  Last Pain:  Vitals:   06/11/21 1711  TempSrc:   PainSc: 7          Complications: No notable events documented.

## 2021-06-11 NOTE — ED Notes (Signed)
Report given to OR RN.

## 2021-06-11 NOTE — Op Note (Signed)
  Operative Note    Name: Anna Flores  Date of Service: 06/11/2021  DOB: 11/04/1990  MRN: [id number]   Pre-Operative Diagnosis:  1) Endometritis 2) pregnancy of unknown anatomic location  Post-Operative Diagnosis:  1) Endometritis due to retained products of conception 2) Retained products of conception 3) pregnancy of unknown anatomic location  Procedures:  1. Dilation and curettage 2. Diagnostic laparoscopy  Primary Surgeon: Thomasene Mohair, MD   EBL: 25 mL   IVF: 700 mL   Urine output: 100 mL  Specimens: endometrial curettings  Drains: none  Complications: None   Disposition: PACU   Condition: Stable   Findings:  1) mild-moderate amount of endometrial curettings, brown-yellow in color. 2) No evidence of ectopic pregnancy on laparoscopy  Procedure Summary:  After informed consent was obtained, the patient was taken to the operating room where anesthesia was obtained without difficulty. The patient was positioned in the dorsal lithotomy position in [place] stirrups.  A Foley catheter was placed in the patient's bladder.  The patient was examined under anesthesia, with the above noted findings.  The bi-valved speculum was placed inside the patient's vagina, and the the anterior lip of the cervix was seen and grasped with the tenaculum.  The cervix was progressively dilated to a 7 Hegar dilator.  The uterine cavity was curetted until a gritty texture was noted, yielding mild-moderate endometrial curettings.  Excellent hemostasis was noted.  The Acorn uterine manipulator was placed without difficulty.  Attention was turned to the abdomen where after injection of local anesthetic, a 5 mm infraumbilical incision was made with the scalpel. Entry into the abdomen was obtained via Optiview trocar technique (a blunt entry technique with camera visualization through the obturator upon entry). Verification of entry into the abdomen was obtained using opening pressures. The abdomen was  insufflated with CO2. The camera was introduced through the trocar with verification of atraumatic entry.  The uterus was not well visualized at this point.  So, a 5 mm suprapubic trocar site was created approximately 3 cm above the pubic symphysis.  This was done under direct intra-abdominal camera visualization.  With the additional instrument the uterus and surrounding structures were able to be well visualized.  There was no evidence of ectopic pregnancy in either fallopian tube, either cornu, or in the pelvis.  Noting no ectopic pregnancy the procedure was terminated.  The abdomen was emptied of CO2.  This was done with the aid of 5 deep breaths from anesthesia.  The trochars were removed.  The subcutaneous tissue was reapproximated using 4-0 Monocryl.  The skin was closed using surgical skin glue.  Attention was returned to the pelvis.  The acorn uterine manipulator was removed.  A sterile speculum was placed in the vagina.  The single-tooth tenaculum was removed from the anterior lip of the cervix.  Hemostasis was obtained at the tenaculum entry sites using silver nitrate.  The Foley catheter was removed after removal of the speculum.  The vagina was inspected to verify that no sponges or instruments remained at the end of the procedure.  The patient tolerated the procedure well.  Sponge, lap, needle, and instrument counts were correct x 2.  VTE prophylaxis: SCDs. Antibiotic prophylaxis: none given. She was awakened in the operating room and was taken to the PACU in stable condition.   Thomasene Mohair, MD 06/11/2021 10:36 PM

## 2021-06-11 NOTE — Anesthesia Preprocedure Evaluation (Addendum)
Anesthesia Evaluation  Patient identified by MRN, date of birth, ID band Patient awake    Reviewed: Allergy & Precautions, NPO status , Patient's Chart, lab work & pertinent test results  Airway Mallampati: II  TM Distance: >3 FB Neck ROM: Full    Dental no notable dental hx.    Pulmonary neg pulmonary ROS,    Pulmonary exam normal breath sounds clear to auscultation       Cardiovascular negative cardio ROS Normal cardiovascular exam Rhythm:Regular Rate:Normal     Neuro/Psych PSYCHIATRIC DISORDERS Anxiety Depression negative neurological ROS     GI/Hepatic negative GI ROS, Neg liver ROS,   Endo/Other  negative endocrine ROS  Renal/GU negative Renal ROS  negative genitourinary   Musculoskeletal negative musculoskeletal ROS (+)   Abdominal (+) + obese,   Peds negative pediatric ROS (+)  Hematology negative hematology ROS (+)   Anesthesia Other Findings 30 y.o. [id number] premenopausal female with right flank pain, positive pregnancy test, abnormal uterine/cervical discharge. She has a possible ectopic pregnancy.  Reproductive/Obstetrics negative OB ROS                            Anesthesia Physical Anesthesia Plan  ASA: 2  Anesthesia Plan: General   Post-op Pain Management:    Induction: Intravenous  PONV Risk Score and Plan: 3 and Ondansetron, Dexamethasone and Midazolam  Airway Management Planned: Oral ETT  Additional Equipment:   Intra-op Plan:   Post-operative Plan: Extubation in OR  Informed Consent: I have reviewed the patients History and Physical, chart, labs and discussed the procedure including the risks, benefits and alternatives for the proposed anesthesia with the patient or authorized representative who has indicated his/her understanding and acceptance.     Dental advisory given  Plan Discussed with: Anesthesiologist and CRNA  Anesthesia Plan Comments:          Anesthesia Quick Evaluation

## 2021-06-11 NOTE — ED Notes (Signed)
Pt states painful urination 2 days ago, today painful "after flow stops."

## 2021-06-11 NOTE — ED Triage Notes (Signed)
Pt to ED for right flank pain, hematuria and nausea for the past few days. NAD noted  Reports unconfirmed miscarriage 3 weeks ago
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
## 2021-06-11 NOTE — H&P (Signed)
GYNECOLOGY CONSULT NOTE  GYN Consultation  Attending Provider: Marjean Donna, MD  Anna Flores [id number] 06/11/2021 5:23 PM    Reason for Consultation:   Anna Flores is a 30 y.o. [id number] premenopausal female seen at the request of Dr. Marjean Donna for evaluation of right flank pain, positive pregnancy test, abnormal uterine/cervical discharge.    History of Present Ilness:   The patient presents with a 2-day history of right flank pain that radiates to her right lower quadrant.  The pain is constant.  She states the pain feels like pressure.  She rates the pain as a 9 out of 10.  She denies fevers and chills.  She has felt unwell today.  She also has had nausea without emesis.  Of note she states that she was told that she was [redacted] weeks pregnant about 3 weeks ago.  She then had heavy vaginal bleeding with severe cramping and the passage of large clots about 3 weeks ago.  She assumed that she had a miscarriage.  She has had no menses since that time.  She denies vaginal bleeding and abnormal discharge.  She states that she does not have dysuria until the very end of voiding and then it is quite painful.  She notes a little blood on the tissue and believes this is coming from her urethral opening instead of her vagina.  Nothing alleviates the pain.  Movement aggravates the pain.  Her associated symptoms are nausea and dysuria.  Past Medical History:  Diagnosis Date   Abdominal pain 10/26/2013   Anemia 2013   Anxiety    Back pain affecting pregnancy in third trimester 02/28/2020   Depression    History of Papanicolaou smear of cervix 10/02/2016   NEG   IDA (iron deficiency anemia) 02/29/2020   Indication for care in labor and delivery, antepartum 03/07/2020   Mastitis 01/19/2021   Menometrorrhagia 10/26/2013   Postpartum galactocele 02/01/2021   Pregnancy    DELIVERED 08/12/16 - JEG   Past Surgical History:  Procedure Laterality Date   DIAGNOSTIC LAPAROSCOPY  2018   suspected ectopic pregnancy-[facility]    INCISION AND DRAINAGE ABSCESS Right 02/06/2021   Procedure: INCISION AND DRAINAGE ABSCESS;  Surgeon: Jules Husbands, MD;  Location: [facility] ORS;  Service: General;  Laterality: Right;   TONSILLECTOMY     Allergies  Allergen Reactions   Amoxicillin Hives   Hydrocodone Shortness Of Breath and Other (See Comments)    "loss of consciousness"; "felt really weird" when she was given 10 mgm hydrocodone. Did well with 5 mgm tablet   Vancomycin Itching    C/O head burning and itching   Reglan [Metoclopramide] Other (See Comments)   Cherry Hives   Prior to Admission medications   Medication Sig Start Date End Date Taking? Authorizing Provider  acetaminophen (TYLENOL) 500 MG tablet Take 1,000 mg by mouth every 6 (six) hours as needed.    [provider]  ALPRAZolam Duanne Moron) 0.5 MG tablet Take 0.5 mg by mouth at bedtime. 0.25 tablet at bedtime and PRN    [provider]  ARIPiprazole (ABILIFY) 5 MG tablet Take 5 mg by mouth daily. At bedtime    [provider]  ibuprofen (ADVIL) 200 MG tablet Take 400 mg by mouth every 6 (six) hours as needed.    [provider]  lamoTRIgine (LAMICTAL) 25 MG tablet Take 25 mg by mouth daily. At bedtime    [provider]  PARoxetine (PAXIL) 10 MG tablet TAKE 1 TABLET BY MOUTH  EVERY DAY 03/28/21   Imagene Riches, CNM  prochlorperazine (COMPAZINE) 10 MG tablet Take 1 tablet (10 mg total) by mouth every 6 (six) hours as needed for nausea or vomiting (headache). 09/28/19 12/07/19  Will Bonnet, MD    Obstetric History: She is a [id number] female s/p SVD.    Social History:  She  reports that she has never smoked. She has never used smokeless tobacco. She reports that she does not drink alcohol and does not use drugs.  Family History:  family history includes Cirrhosis in her father; Nevi in her sister; Ovarian cancer in her mother. She was adopted.   Review of Systems  Constitutional: Negative.  Negative for chills and fever.   HENT: Negative.    Eyes: Negative.   Respiratory: Negative.    Cardiovascular: Negative.   Gastrointestinal:  Positive for abdominal pain and nausea. Negative for blood in stool, constipation, diarrhea, heartburn, melena and vomiting.  Genitourinary:  Positive for dysuria and flank pain (right flank). Negative for frequency, hematuria and urgency.  Musculoskeletal:  Positive for back pain. Negative for falls, joint pain, myalgias and neck pain.  Skin: Negative.   Neurological: Negative.   Psychiatric/Behavioral: Negative.      Objective    BP 114/72 (BP Location: Right Arm)   Pulse 98   Temp 98.2 F (36.8 C) (Oral)   Resp 17   Ht 5\' 5"  (1.651 m)   Wt 95.3 kg   SpO2 99%   BMI 34.95 kg/m  Physical Exam Constitutional:      General: She is not in acute distress.    Appearance: Normal appearance. She is well-developed.  Genitourinary:     Genitourinary Comments: deferred  HENT:     Head: Normocephalic and atraumatic.  Eyes:     General: No scleral icterus.    Conjunctiva/sclera: Conjunctivae normal.  Cardiovascular:     Rate and Rhythm: Normal rate and regular rhythm.     Heart sounds: No murmur heard.   No friction rub. No gallop.  Pulmonary:     Effort: Pulmonary effort is normal. No respiratory distress.     Breath sounds: Normal breath sounds. No wheezing or rales.  Abdominal:     General: Bowel sounds are normal. There is no distension.     Palpations: Abdomen is soft. There is no mass.     Tenderness: There is abdominal tenderness (suprapubic and bialteral lower quadrants). There is right CVA tenderness. There is no left CVA tenderness, guarding or rebound.  Musculoskeletal:        General: Normal range of motion.     Cervical back: Normal range of motion and neck supple.  Neurological:     General: No focal deficit present.     Mental Status: She is alert and oriented to person, place, and time.     Cranial Nerves: No cranial nerve deficit.  Skin:    General:  Skin is warm and dry.     Findings: No erythema.  Psychiatric:        Mood and Affect: Mood normal.        Behavior: Behavior normal.        Judgment: Judgment normal.     Laboratory Results:   Lab Results  Component Value Date   WBC 11.3 (H) 06/11/2021   RBC 4.38 06/11/2021   HGB 12.5 06/11/2021   HCT 38.2 06/11/2021   PLT 305 06/11/2021   NA 136 06/11/2021   K 3.5 06/11/2021   CREATININE  0.79 06/11/2021   Lab Results  Component Value Date   HCGBETAQNT 7,194 (H) 06/11/2021   Lab Results  Component Value Date   CHLAMYDIA NOT DETECTED 06/11/2021   NGONORRHOEAE NOT DETECTED 06/11/2021   Lab Results  Component Value Date   APPEARANCEUR HAZY (A) 06/11/2021   GLUCOSEU NEGATIVE 06/11/2021   BILIRUBINUR NEGATIVE 06/11/2021   KETONESUR NEGATIVE 06/11/2021   LABSPEC 1.009 06/11/2021   HGBUR SMALL (A) 06/11/2021   PHURINE 7.0 06/11/2021   NITRITE NEGATIVE 06/11/2021   LEUKOCYTESUR LARGE (A) 06/11/2021   RBCU 6-10 06/11/2021   WBCU >50 (H) 06/11/2021   BACTERIA FEW (A) 06/11/2021   EPIU 0-5 06/11/2021      Imaging Results:  US Renal  Result Date: 06/11/2021 CLINICAL DATA:  Abdominal pain.  Right flank pain EXAM: RENAL / URINARY TRACT ULTRASOUND COMPLETE COMPARISON:  CT 06/13/2019 FINDINGS: Right Kidney: Renal measurements: 11.5 x 4.6 x 5.9 cm = volume: 160 mL. Echogenicity within normal limits. No mass, shadowing stone, or hydronephrosis visualized. Left Kidney: Renal measurements: 12.1 x 5.3 x 5.7 cm = volume: 194 mL. Echogenicity within normal limits. No mass, shadowing stone, or hydronephrosis visualized. Bladder: Appears normal for degree of bladder distention. Other: None. IMPRESSION: Unremarkable ultrasound of the kidneys and urinary bladder. Electronically Signed   By: Duanne Guess D.O.   On: 06/11/2021 13:29   US OB LESS THAN 14 WEEKS WITH OB TRANSVAGINAL  Result Date: 06/11/2021 CLINICAL DATA:  Abdominal pain. Serum quantitative beta hCG of 7,194. Patient  reports history of miscarriage 3 weeks ago EXAM: OBSTETRIC <14 WK [place] AND TRANSVAGINAL OB US TECHNIQUE: Both transabdominal and transvaginal ultrasound examinations were performed for complete evaluation of the gestation as well as the maternal uterus, adnexal regions, and pelvic cul-de-sac. Transvaginal technique was performed to assess early pregnancy. COMPARISON:  None. FINDINGS: Intrauterine gestational sac: None Yolk sac:  Not Visualized. Embryo:  Not Visualized. Maternal uterus/adnexae: Endometrial thickness of approximately 9 mm. Small amount of fluid within the endometrial cavity. Nonvascular area of increased echogenicity within the endometrial cavity measuring 12 x 5 x 6 mm. Right ovary measures 2.9 x 2.6 x 2.3 cm and appears unremarkable. Left ovary measures 2.6 x 1.6 x 1.9 cm and appears unremarkable. No adnexal mass identified. No free fluid within the pelvis. IMPRESSION: 1. No intrauterine pregnancy identified. No adnexal masses identified. Ectopic pregnancy unable to be excluded. Close clinical follow-up, serial beta HCG, and short interval repeat ultrasound are recommended. 2. Small amount of nonvascular material noted in the endometrial cavity, which could represent blood products versus retained products of conception. Electronically Signed   By: Duanne Guess D.O.   On: 06/11/2021 13:52      Assessment & Plan   Anna Flores is a 30 y.o. [id number] premenopausal female with right flank pain, elevated beta hCG that is higher than expected.  She also has a purulent discharge that is likely to indicate an intrauterine infection of some sort based on correlating ultrasound findings. Given the ultrasound findings along with her beta hCG, and level of pain, I recommend a dilation and curettage with a diagnostic laparoscopy with possible removal of ectopic pregnancy if one is found.  If all this is normal, she will still need inpatient antibiotics and treatment for either a uti with or without  pyelonephritis or endometritis.    Recommendations:  1.  To OR for dilation and curettage with diagnostic laparoscopy with possible removal of ectopic pregnancy.  She has been personally counseled by me regarding  the need for these surgical evaluations and treatments. She has been counseled about the risks that include, but are not limited to: bleeding, infection, damage to nearby organs (bowel, bladder, nerves, blood vessels, etc.).  She agrees to proceed. 2.  Observation admission for administration of IV antibiotics with possible discharge tomorrow depending on clinical improvement.  Perhaps coverage for PID in this setting.    A total of 48 minutes were spent face-to-face with the patient as well as preparation, review, communication, and documentation during this encounter.    Prentice Docker, MD 06/11/2021 5:23 PM

## 2021-06-11 NOTE — ED Notes (Signed)
Pt given ice pack per her request.

## 2021-06-11 NOTE — ED Notes (Signed)
Dr. Larinda Buttery notified of wrong wet prep tube sent to lab. No new orders.

## 2021-06-12 ENCOUNTER — Encounter: Payer: Self-pay | Admitting: Obstetrics and Gynecology

## 2021-06-12 ENCOUNTER — Ambulatory Visit: Payer: Self-pay | Admitting: Internal Medicine

## 2021-06-12 DIAGNOSIS — N12 Tubulo-interstitial nephritis, not specified as acute or chronic: Secondary | ICD-10-CM | POA: Diagnosis not present

## 2021-06-12 DIAGNOSIS — O021 Missed abortion: Secondary | ICD-10-CM | POA: Diagnosis not present

## 2021-06-12 DIAGNOSIS — N719 Inflammatory disease of uterus, unspecified: Secondary | ICD-10-CM

## 2021-06-12 DIAGNOSIS — R1031 Right lower quadrant pain: Secondary | ICD-10-CM | POA: Diagnosis not present

## 2021-06-12 LAB — BASIC METABOLIC PANEL
Anion gap: 9 (ref 5–15)
BUN: 8 mg/dL (ref 6–20)
CO2: 25 mmol/L (ref 22–32)
Calcium: 8.9 mg/dL (ref 8.9–10.3)
Chloride: 103 mmol/L (ref 98–111)
Creatinine, Ser: 0.93 mg/dL (ref 0.44–1.00)
GFR, Estimated: >60 mL/min (ref >60)
Glucose, Bld: 167 mg/dL — ABNORMAL HIGH (ref 70–99)
Potassium: 4.2 mmol/L (ref 3.5–5.1)
Sodium: 137 mmol/L (ref 135–145)

## 2021-06-12 LAB — CBC
HCT: 33.4 % — ABNORMAL LOW (ref 36.0–46.0)
Hemoglobin: 11.1 g/dL — ABNORMAL LOW (ref 12.0–15.0)
MCH: 29.4 pg (ref 26.0–34.0)
MCHC: 33.2 g/dL (ref 30.0–36.0)
MCV: 88.6 fL (ref 80.0–100.0)
Platelets: 264 10*3/uL (ref 150–400)
RBC: 3.77 MIL/uL — ABNORMAL LOW (ref 3.87–5.11)
RDW: 12.9 % (ref 11.5–15.5)
WBC: 9.1 10*3/uL (ref 4.0–10.5)
nRBC: 0 % (ref 0.0–0.2)

## 2021-06-12 LAB — HCG, QUANTITATIVE, PREGNANCY: hCG, Beta Chain, Quant, S: 4262 m[IU]/mL — ABNORMAL HIGH (ref <5)

## 2021-06-12 MED ORDER — OXYCODONE-ACETAMINOPHEN 5-325 MG PO TABS: 1.0000 | ORAL_TABLET | Freq: Four times a day (QID) | ORAL | 0 refills | Status: AC | PRN

## 2021-06-12 MED ORDER — LAMOTRIGINE 100 MG PO TABS
100.0000 mg | ORAL_TABLET | Freq: Every day | ORAL | Status: DC
  Filled 2021-06-12: qty 1

## 2021-06-12 MED ORDER — IBUPROFEN 600 MG PO TABS: 600.0000 mg | ORAL_TABLET | Freq: Four times a day (QID) | ORAL | 0 refills | Status: DC

## 2021-06-12 MED ORDER — DOXYCYCLINE HYCLATE 100 MG PO CAPS: 100.0000 mg | ORAL_CAPSULE | Freq: Two times a day (BID) | ORAL | 0 refills | Status: AC

## 2021-06-12 MED ORDER — SULFAMETHOXAZOLE-TRIMETHOPRIM 800-160 MG PO TABS: 1.0000 | ORAL_TABLET | Freq: Two times a day (BID) | ORAL | 0 refills | Status: AC

## 2021-06-12 NOTE — Discharge Summary (Signed)
DC Summary Discharge Summary   Patient ID: Anna Flores [id number] 30 y.o. 1991-04-26  Admit date: 06/11/2021  Discharge date: 06/12/2021  Principal Diagnoses:  Retained products of conception Endomyometritis Pyelonephritis (right)  Secondary Diagnoses:  none  Procedures performed during the hospitalization:  Dilation and curettage Diagnostic laparoscopy Treatment with antibiotics for infections noted above  HPI: Maryl Blalock is a 30 y.o. [id number] premenopausal female seen at the request of Dr. Artis Delay for evaluation of right flank pain, positive pregnancy test, abnormal uterine/cervical discharge.   The patient presents with a 2-day history of right flank pain that radiates to her right lower quadrant.  The pain is constant.  She states the pain feels like pressure.  She rates the pain as a 9 out of 10.  She denies fevers and chills.  She has felt unwell today.  She also has had nausea without emesis.  Of note she states that she was told that she was [redacted] weeks pregnant about 3 weeks ago.  She then had heavy vaginal bleeding with severe cramping and the passage of large clots about 3 weeks ago.  She assumed that she had a miscarriage.  She has had no menses since that time.  She denies vaginal bleeding and abnormal discharge.  She states that she does not have dysuria until the very end of voiding and then it is quite painful.  She notes a little blood on the tissue and believes this is coming from her urethral opening instead of her vagina.  Nothing alleviates the pain.  Movement aggravates the pain.  Her associated symptoms are nausea and dysuria.  Past Medical History:  Diagnosis Date   Abdominal pain 10/26/2013   Anemia 2013   Anxiety    Back pain affecting pregnancy in third trimester 02/28/2020   Depression    History of Papanicolaou smear of cervix 10/02/2016   NEG   IDA (iron deficiency anemia) 02/29/2020   Indication for care in labor and delivery, antepartum 03/07/2020    Mastitis 01/19/2021   Menometrorrhagia 10/26/2013   Postpartum galactocele 02/01/2021   Pregnancy    DELIVERED 08/12/16 - [facility]    Past Surgical History:  Procedure Laterality Date   DIAGNOSTIC LAPAROSCOPY  2018   suspected ectopic pregnancy-[facility]   DILATION AND EVACUATION N/A 06/11/2021   Procedure: D & C;  Surgeon: Conard Novak, MD;  Location: [facility] ORS;  Service: Gynecology;  Laterality: N/A;   INCISION AND DRAINAGE ABSCESS Right 02/06/2021   Procedure: INCISION AND DRAINAGE ABSCESS;  Surgeon: Leafy Ro, MD;  Location: [facility] ORS;  Service: General;  Laterality: Right;   LAPAROSCOPY N/A 06/11/2021   Procedure: LAPAROSCOPY DIAGNOSTIC;  Surgeon: Conard Novak, MD;  Location: [facility] ORS;  Service: Gynecology;  Laterality: N/A;   TONSILLECTOMY      Allergies  Allergen Reactions   Amoxicillin Hives   Hydrocodone Shortness Of Breath and Other (See Comments)    "loss of consciousness"; "felt really weird" when she was given 10 mgm hydrocodone. Did well with 5 mgm tablet   Vancomycin Itching    C/O head burning and itching   Reglan [Metoclopramide] Other (See Comments)   Cherry Hives    Social History   Tobacco Use   Smoking status: Never   Smokeless tobacco: Never  Vaping Use   Vaping Use: Never used  Substance Use Topics   Alcohol use: No   Drug use: No    Family History  Adopted: Yes  Problem Relation Age of Onset  Nevi Sister        DISPLASTIC NEVUS   Ovarian cancer Mother    Cirrhosis Father     Hospital Course:  The patient was admitted through the ER and was taken to the OR for the above surgeries. She was monitored overnight for fever or worsening symptoms. She was also given antibiotics. By POD#1 she was ambulating, tolerating PO, voiding spontaneously, and had adequate pain control. Her symptoms had improved greatly and she desired to go home. She was discharged home in stable condition without any fevers or SIRS criteria.  She was given a 10 day course of  antibiotics to cover both endometritis and pyelonephritis as it was unclear the exact issue she had.   Discharge Exam: BP 108/62 (BP Location: Left Arm)   Pulse 88   Temp 98.6 F (37 C) (Oral)   Resp 20   Ht 5\' 5"  (1.651 m)   Wt 95.3 kg   SpO2 99%   BMI 34.95 kg/m  Physical Exam Constitutional:      General: She is not in acute distress.    Appearance: Normal appearance. She is well-developed.  HENT:     Head: Normocephalic and atraumatic.  Eyes:     General: No scleral icterus.    Conjunctiva/sclera: Conjunctivae normal.  Cardiovascular:     Rate and Rhythm: Normal rate and regular rhythm.     Heart sounds: No murmur heard.   No friction rub. No gallop.  Pulmonary:     Effort: Pulmonary effort is normal. No respiratory distress.     Breath sounds: Normal breath sounds. No wheezing or rales.  Abdominal:     General: Bowel sounds are normal. There is no distension.     Palpations: Abdomen is soft. There is no mass.     Tenderness: There is abdominal tenderness (appropriate given clinical circumstance). There is no right CVA tenderness, left CVA tenderness, guarding or rebound.     Comments: Incisions: without erythema, induration, warmth, and tenderness. They are clean, dry, and intact.     Musculoskeletal:        General: Normal range of motion.     Cervical back: Normal range of motion and neck supple.  Neurological:     General: No focal deficit present.     Mental Status: She is alert and oriented to person, place, and time.     Cranial Nerves: No cranial nerve deficit.  Skin:    General: Skin is warm and dry.     Findings: No erythema.  Psychiatric:        Mood and Affect: Mood normal.        Behavior: Behavior normal.        Judgment: Judgment normal.     Condition at Discharge: Stable  Complications affecting treatment: None  Discharge Medications:  Allergies as of 06/12/2021       Reactions   Amoxicillin Hives   Hydrocodone Shortness Of Breath, Other  (See Comments)   "loss of consciousness"; "felt really weird" when she was given 10 mgm hydrocodone. Did well with 5 mgm tablet   Vancomycin Itching   C/O head burning and itching   Reglan [metoclopramide] Other (See Comments)   Cherry Hives        Medication List     STOP taking these medications    acetaminophen 500 MG tablet Commonly known as: TYLENOL   ARIPiprazole 5 MG tablet Commonly known as: ABILIFY   PARoxetine 10 MG tablet Commonly known as: PAXIL  TAKE these medications    ALPRAZolam 0.5 MG tablet Commonly known as: XANAX Take 0.5 mg by mouth at bedtime. 0.25 tablet at bedtime and PRN   doxycycline 100 MG capsule Commonly known as: VIBRAMYCIN Take 1 capsule (100 mg total) by mouth 2 (two) times daily for 10 days.   ibuprofen 600 MG tablet Commonly known as: ADVIL Take 1 tablet (600 mg total) by mouth every 6 (six) hours. What changed:  medication strength how much to take when to take this reasons to take this   lamoTRIgine 100 MG tablet Commonly known as: LAMICTAL Take 100 mg by mouth daily. What changed: Another medication with the same name was removed. Continue taking this medication, and follow the directions you see here.   oxyCODONE-acetaminophen 5-325 MG tablet Commonly known as: PERCOCET/ROXICET Take 1 tablet by mouth every 6 (six) hours as needed for up to 5 days for severe pain (breakthrough pain).   QUEtiapine 50 MG tablet Commonly known as: SEROQUEL Take 25 mg by mouth daily.   sulfamethoxazole-trimethoprim 800-160 MG tablet Commonly known as: Bactrim DS Take 1 tablet by mouth 2 (two) times daily for 10 days.   Vraylar 1.5 MG capsule Generic drug: cariprazine Take 1.5 mg by mouth daily.               Discharge Care Instructions  (From admission, onward)           Start     Ordered   06/12/21 0000  Discharge wound care:       Comments: General wound care for laparoscopic incisions   06/12/21 1331              Follow-up arrangements:   Follow-up Information     Conard Novak, MD. Schedule an appointment as soon as possible for a visit in 2 week(s).   Specialty: Obstetrics and Gynecology Why: Post-op follow up Contact information: [address] [place] [address] [phone]                Discharge Disposition: Discharge disposition: 01-Home or Self Care    Signed: Thomasene Mohair, MD  06/12/2021 1:31 PM

## 2021-06-12 NOTE — Progress Notes (Signed)
Pt discharged home.  Discharge instructions, prescriptions and follow up appointment given to and reviewed with pt.  Pt verbalized understanding.  Escorted by auxillary. 

## 2021-06-13 LAB — URINE CULTURE: Culture: >=100000 — AB

## 2021-06-13 LAB — SURGICAL PATHOLOGY

## 2021-06-13 NOTE — Anesthesia Postprocedure Evaluation (Signed)
Anesthesia Post Note  Patient: Anna Flores  Procedure(s) Performed: LAPAROSCOPY DIAGNOSTIC D & C  Patient location during evaluation: PACU Anesthesia Type: General Level of consciousness: awake and alert Pain management: pain level controlled Vital Signs Assessment: post-procedure vital signs reviewed and stable Respiratory status: spontaneous breathing, nonlabored ventilation and respiratory function stable Cardiovascular status: blood pressure returned to baseline and stable Postop Assessment: no apparent nausea or vomiting Anesthetic complications: no   No notable events documented.   Last Vitals:  Vitals:   06/12/21 0820 06/12/21 1106  BP: 101/64 108/62  Pulse: 67 88  Resp: 18 20  Temp: 36.8 C 37 C  SpO2: 100% 99%    Last Pain:  Vitals:   06/12/21 1330  TempSrc:   PainSc: 2                  Foye Deer

## 2021-06-19 IMAGING — US US BREAST*R* LIMITED INC AXILLA
1 series · 14 of 18 positions shown · non-contrast
Comparison: None.

CLINICAL DATA: Right breast pain for 1 month with increased
severity in the last 3 days. Patient is currently nursing.

EXAM:
ULTRASOUND OF THE right BREAST

[Series 1: [facility] right inc axilla · 18 acquisitions, 14 frames shown]
[im 1/18]
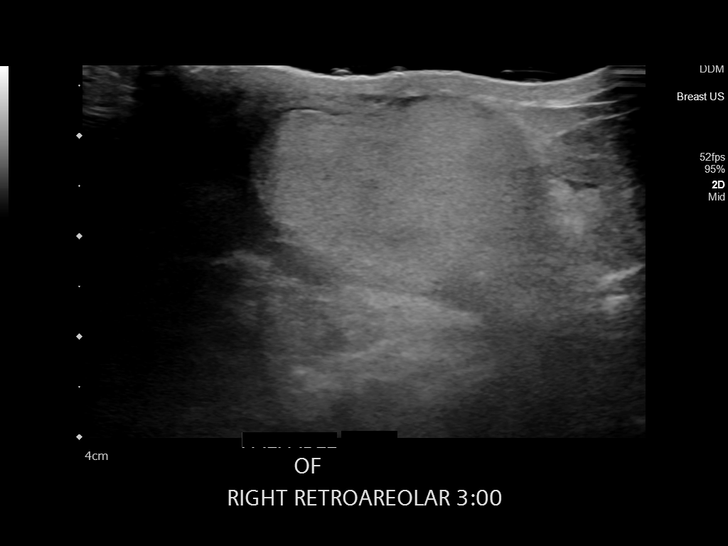
[im 2/18]
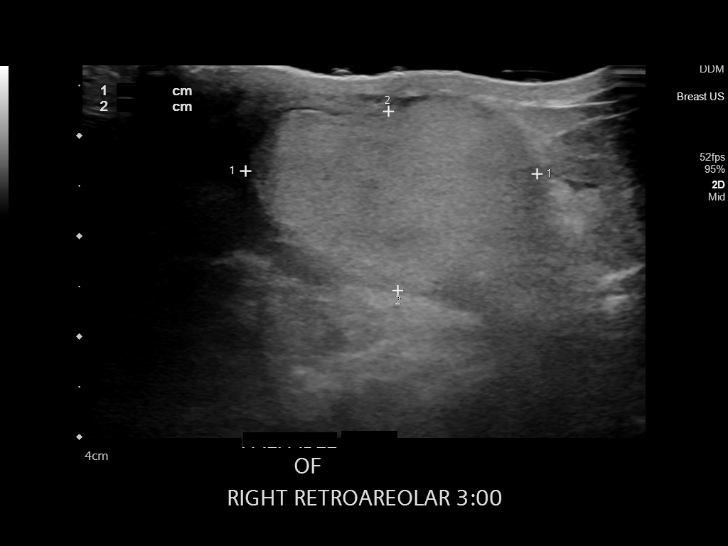
[im 4/18]
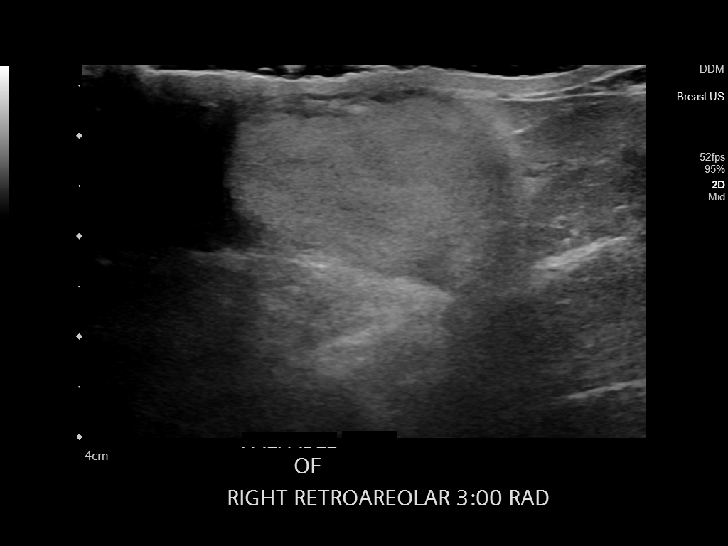
[im 5/18]
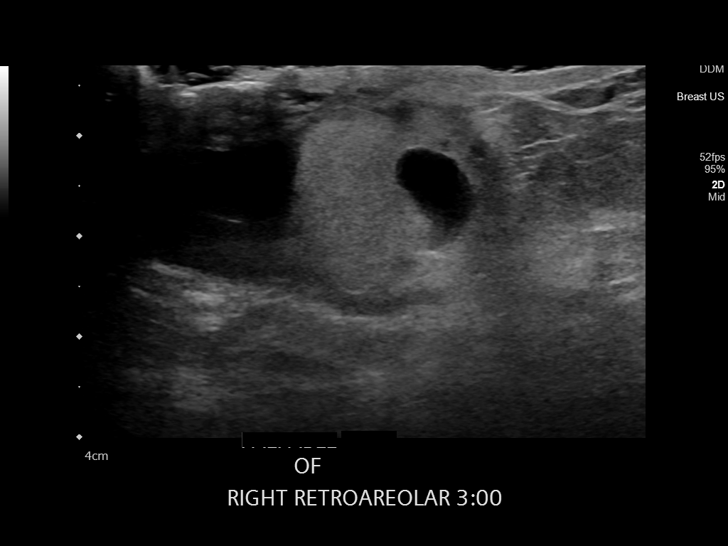
[im 6/18]
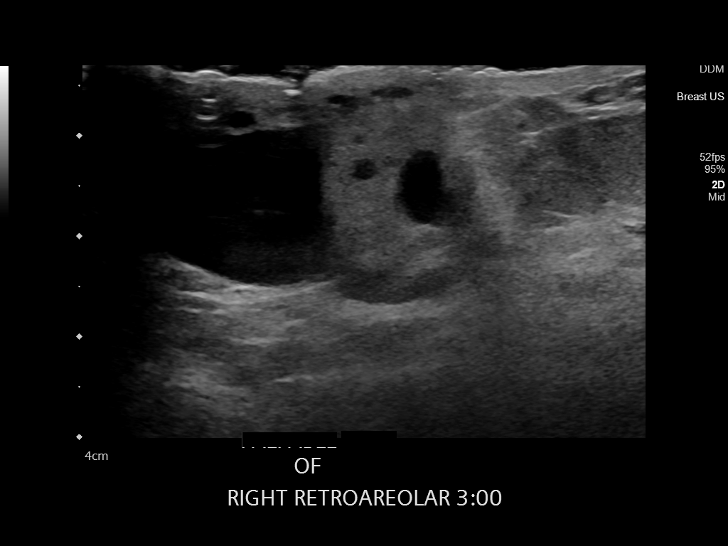
[im 8/18]
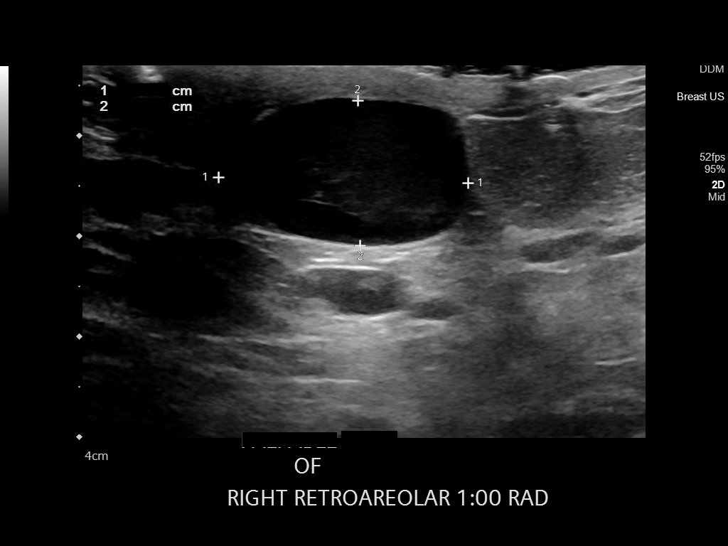
[im 9/18]
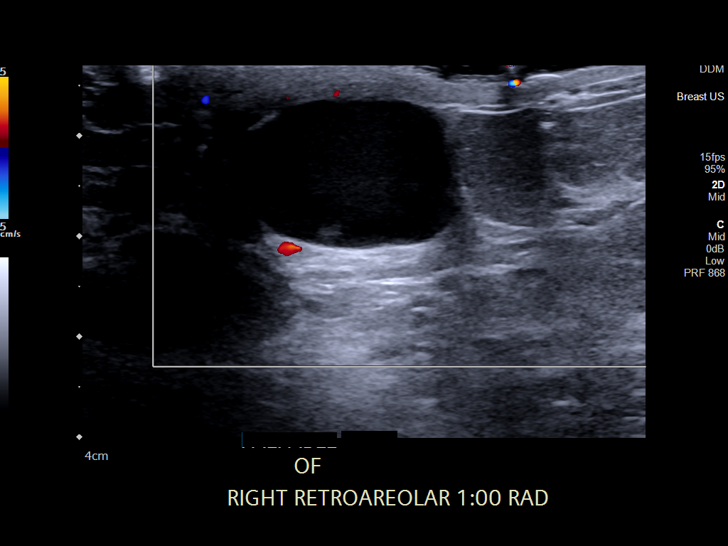
[im 10/18]
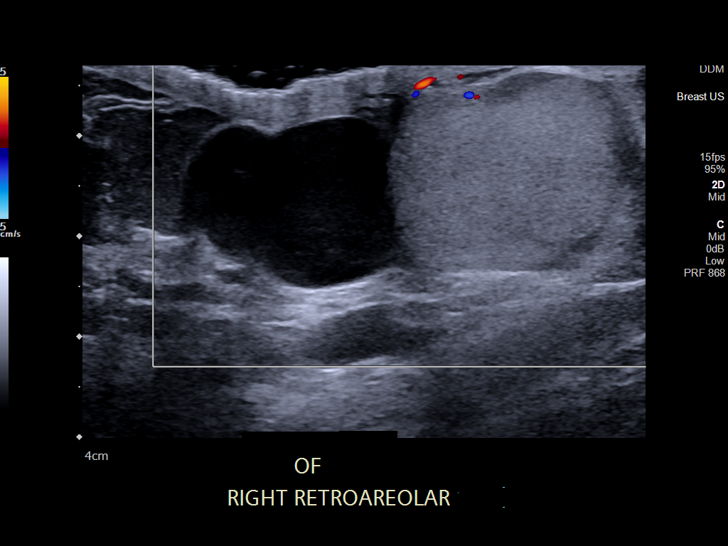
[im 11/18]
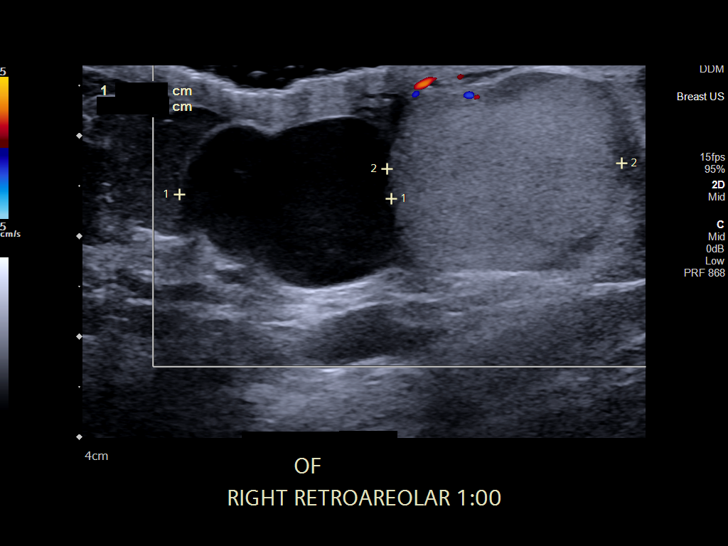
[im 13/18]
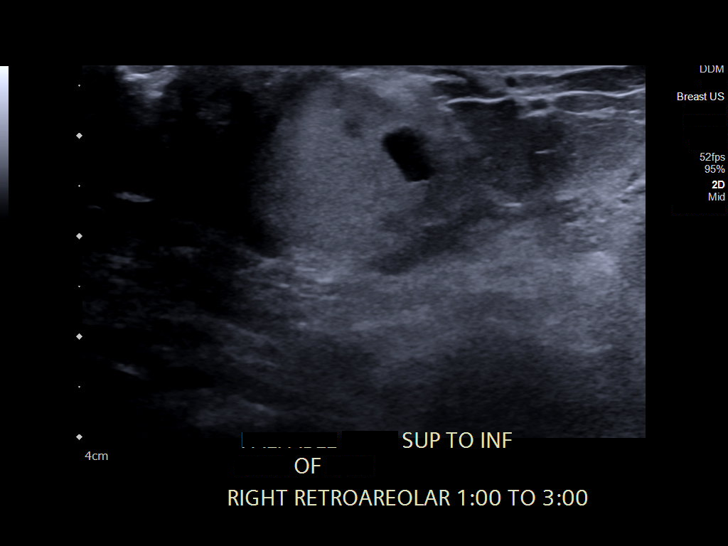
[im 14/18]
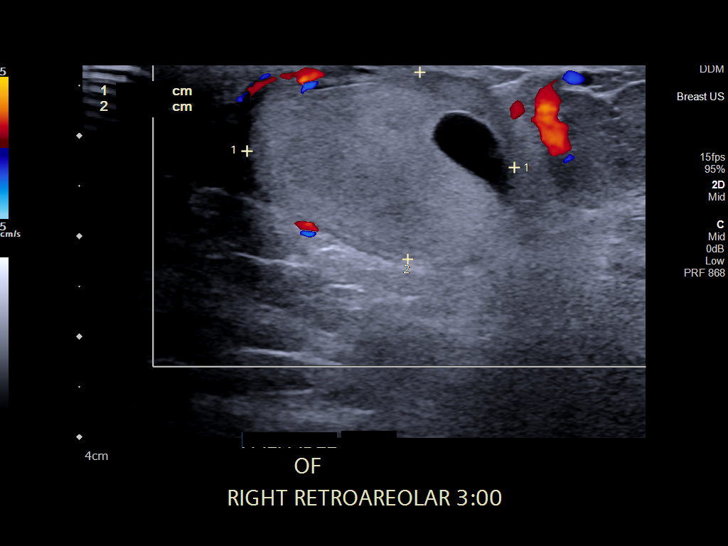
[im 15/18]
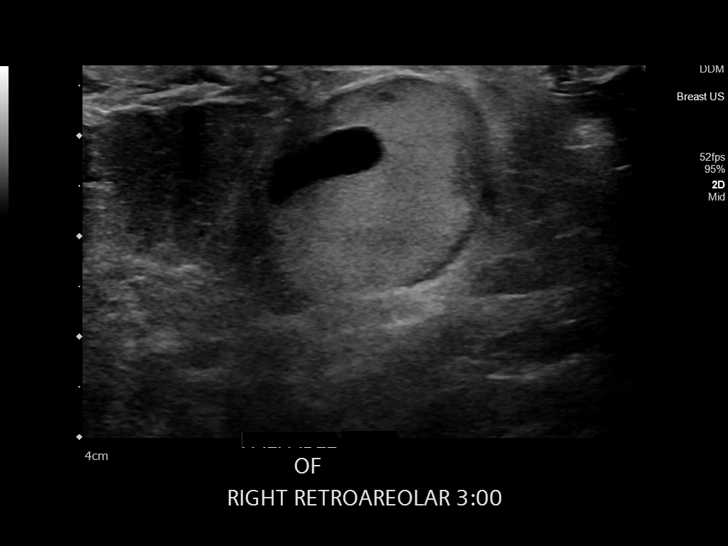
[im 17/18]
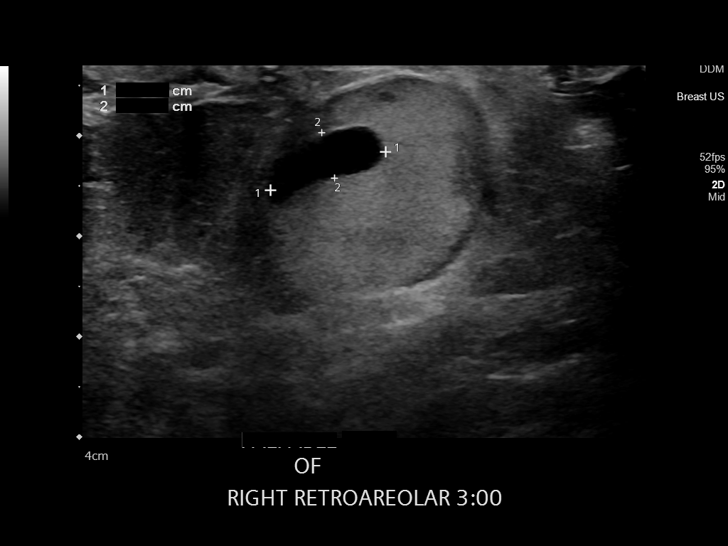
[im 18/18]
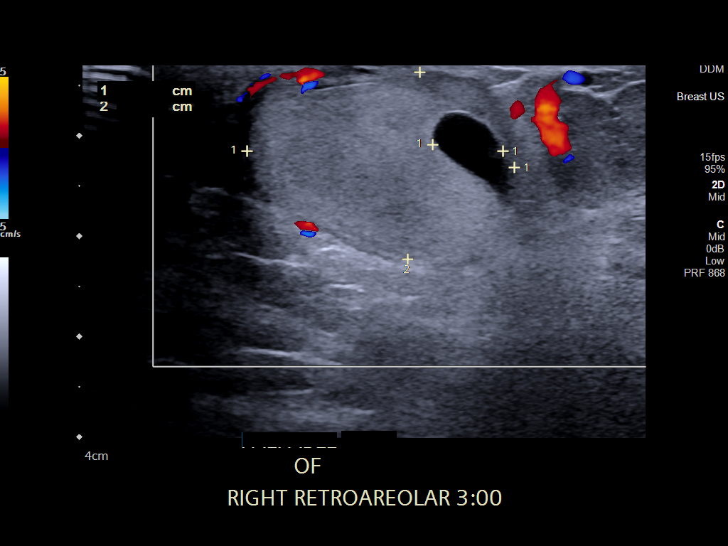

[14 of 18 positions shown; findings below may reference images not displayed]

FINDINGS: Targeted ultrasound is performed, showing complex cystic and solid
process in the right retroareolar breast at 3 o'clock position
corresponding to the palpable area pain. The lesion measures about
2.3 x 2.7 x 1.9 cm with cystic portion measuring 1.2 cm maximal
diameter. Adjacent cyst measuring 2.5 cm maximal diameter. Mild
color flow Doppler changes surrounding the area without internal
flow.
IMPRESSION: Complex cystic and solid process demonstrated in the right breast
corresponding to area of palpable abnormality.

RECOMMENDATION:
This examination was performed on an emergent/urgent basis to answer
a single clinical question and does not constitute a full diagnostic
work-up. A full diagnostic work-up at a facility that provides
diagnostic breast imaging is recommended to include diagnostic
mammography and/or repeat breast ultrasound as soon as possible.

## 2021-06-22 ENCOUNTER — Ambulatory Visit (INDEPENDENT_AMBULATORY_CARE_PROVIDER_SITE_OTHER): Payer: Medicaid Other | Admitting: Obstetrics and Gynecology

## 2021-06-22 ENCOUNTER — Other Ambulatory Visit: Payer: Self-pay

## 2021-06-22 ENCOUNTER — Encounter: Payer: Self-pay | Admitting: Obstetrics and Gynecology

## 2021-06-22 VITALS — BP 122/74 | Ht 65.0 in | Wt 213.0 lb

## 2021-06-22 DIAGNOSIS — Z09 Encounter for follow-up examination after completed treatment for conditions other than malignant neoplasm: Secondary | ICD-10-CM

## 2021-06-22 NOTE — Progress Notes (Signed)
   Postoperative Follow-up Patient presents post op from Dilation and curettage and diagnostic laparoscopy  11  days ago for endometritis with retained products of conception with concern for pregnancy of unknown anatomic location.  Subjective: Patient reports marked improvement in her preop symptoms. Eating a regular diet without difficulty. The patient is not having any pain.  Activity: normal activities of daily living.  She denies fever, chills, nausea, and vomiting.   PATHOLOGY REPORT: SURGICAL PATHOLOGY   DIAGNOSIS:  A. ENDOMETRIUM; CURETTAGE:  - CHORIONIC VILLI AND DECIDUA, COMPATIBLE WITH PRODUCTS OF CONCEPTION.  - BENIGN SECRETORY ENDOMETRIUM.  - NEGATIVE FOR MALIGNANCY.  Objective: Vital Signs: BP 122/74   Ht 5\' 5"  (1.651 m)   Wt 213 lb (96.6 kg)   BMI 35.45 kg/m  Physical Exam Constitutional:      General: She is not in acute distress.    Appearance: Normal appearance.  HENT:     Head: Normocephalic and atraumatic.  Eyes:     General: No scleral icterus.    Conjunctiva/sclera: Conjunctivae normal.  Abdominal:     General: There is no distension.     Palpations: Abdomen is soft.     Tenderness: There is no abdominal tenderness. There is no guarding or rebound.     Comments: Incisions: without erythema, induration, warmth, and tenderness. They are clean, dry, and intact.  Neurological:     General: No focal deficit present.     Mental Status: She is alert and oriented to person, place, and time.     Cranial Nerves: No cranial nerve deficit.  Psychiatric:        Mood and Affect: Mood normal.        Behavior: Behavior normal.        Judgment: Judgment normal.     Assessment: 30 y.o. s/p above surgery progressing well  Plan: Patient has done well after surgery with no apparent complications.  I have discussed the post-operative course to date, and the expected progress moving forward.  The patient understands what complications to be concerned about.  I will  see the patient in routine follow up, or sooner if needed.    Activity plan: No restriction.  Patient would like to have a tubal ligation. 30 day papers signed today. SHe has 5 children and is absolutely sure. 38 y.o. 26  with undesired fertility, desires permanent sterilization.  Other reversible forms of contraception were discussed with patient; she declines all other modalities. Permanent nature of as well as associated risks of the procedure discussed with patient including but not limited to: risk of regret, permanence of method, bleeding, infection, injury to surrounding organs and need for additional procedures.  Failure risk of 0.5-1% with increased risk of ectopic gestation if pregnancy occurs was also discussed with patient.     [id number], MD  06/22/2021, 9:26 AM

## 2021-06-26 ENCOUNTER — Telehealth: Payer: Self-pay

## 2021-06-26 NOTE — Telephone Encounter (Signed)
-----   Message from Conard Novak, MD sent at 06/22/2021  9:41 AM EST ----- Regarding: Schedule surgery Surgery Booking Request Patient Full Name:  Anna Flores  MRN: [id number]  DOB: 02/23/1991  Surgeon: Any MD  Requested Surgery Date and Time: at least 30 days from today Primary Diagnosis AND Code: desires permanent sterility Secondary Diagnosis and Code:  Surgical Procedure: laparoscopic bilateral salpingectomy (if Schuman, check to see if she wants to use robot) RNFA Requested?: Yes L&D Notification: No Admission Status: same day surgery Length of Surgery: 25 min Special Case Needs: No (unless Schuman wants Higher education careers adviser) H&P: Yes Phone Interview???:  Yes Interpreter: No Medical Clearance:  No Special Scheduling Instructions: see above Any known health/anesthesia issues, diabetes, sleep apnea, latex allergy, defibrillator/pacemaker?: No Acuity: P3   (P1 highest, P2 delay may cause harm, P3 low, elective gyn, P4 lowest) Post op follow up visits: 2 week postop

## 2021-06-26 NOTE — Telephone Encounter (Signed)
Called patient to schedule Xi laparoscopic bilateral salpingectomy w Jerene Pitch  DOS 07/24/21  H&P 12/15 @ 8:10   Pre-admit phone call appointment to be requested - date and time will be included on H&P paper work. Also all appointments will be updated on pt MyChart. Explained that this appointment has a call window. Based on the time scheduled will indicate if the call will be received within a 4 hour window before 1:00 or after.  Advised that pt may also receive calls from the hospital pharmacy and pre-service center.  Confirmed pt has Medicaid as primary insurance. No secondary insurance.   Medicaid BTL signed 06/22/21 w Angelique Blonder

## 2021-07-02 ENCOUNTER — Other Ambulatory Visit: Payer: Self-pay

## 2021-07-02 ENCOUNTER — Encounter: Payer: Self-pay | Admitting: Internal Medicine

## 2021-07-02 ENCOUNTER — Ambulatory Visit: Payer: Medicaid Other | Admitting: Internal Medicine

## 2021-07-02 VITALS — BP 116/72 | HR 94 | Temp 98.0°F | Resp 16 | Ht 65.0 in | Wt 213.1 lb

## 2021-07-02 DIAGNOSIS — F411 Generalized anxiety disorder: Secondary | ICD-10-CM | POA: Diagnosis not present

## 2021-07-02 DIAGNOSIS — N912 Amenorrhea, unspecified: Secondary | ICD-10-CM | POA: Diagnosis not present

## 2021-07-02 DIAGNOSIS — M722 Plantar fascial fibromatosis: Secondary | ICD-10-CM

## 2021-07-02 DIAGNOSIS — F319 Bipolar disorder, unspecified: Secondary | ICD-10-CM | POA: Diagnosis not present

## 2021-07-02 DIAGNOSIS — O034 Incomplete spontaneous abortion without complication: Secondary | ICD-10-CM

## 2021-07-02 DIAGNOSIS — N12 Tubulo-interstitial nephritis, not specified as acute or chronic: Secondary | ICD-10-CM

## 2021-07-02 DIAGNOSIS — D508 Other iron deficiency anemias: Secondary | ICD-10-CM

## 2021-07-02 DIAGNOSIS — Z872 Personal history of diseases of the skin and subcutaneous tissue: Secondary | ICD-10-CM | POA: Diagnosis not present

## 2021-07-02 LAB — POCT URINALYSIS DIPSTICK
Bilirubin, UA: NEGATIVE
Blood, UA: NEGATIVE
Glucose, UA: NEGATIVE
Ketones, UA: NEGATIVE
Leukocytes, UA: NEGATIVE
Nitrite, UA: NEGATIVE
Protein, UA: NEGATIVE
Spec Grav, UA: 1.015 (ref 1.010–1.025)
Urobilinogen, UA: 0.2 E.U./dL
pH, UA: 6.5 (ref 5.0–8.0)

## 2021-07-02 LAB — POCT URINE PREGNANCY: Preg Test, Ur: NEGATIVE

## 2021-07-02 NOTE — Progress Notes (Signed)
New Patient Office Visit  Subjective:  Patient ID: Anna Flores, female    DOB: Aug 02, 1991  Age: 30 y.o. MRN: [id number]  CC:  Chief Complaint  Patient presents with   Establish Care    HPI Anna Flores presents as a new patient to establish care. She is a [id number] 30 year old female. Chronic medical conditions include iron deficiency anemia and GAD. She was recently seen in the ER for flank pain on 06/11/21. She was taken to the OR for D&C, diagnostic laparoscopy and was diagnosed with endometritis and right sided pyelonephritis. She was treated with IV antibiotics and was discharged with oral doxycycline and Bactrim for 10 days. She was compliant with this antibiotic course and denies side effects. Now she states she is feeling well and denies dysuria, hematuria, increased urinary urgency or frequency. She also denies flank pain, suprapubic or abdominal pain. No vaginal pain, bleeding or change in discharge although she states she did have some itchy and dryness after she got out of the hospital but this is better. She did follow up with gynecology postoperatively and is planning on tubal ligation next month on 12/20 and pre-op on 12/15. Her LMP was 04/11/21 and she is concerned because she hasn't had a period since then and is worried she is pregnant. Before she went to the ER, she did have a home pregnancy test in October and took a pill to induce abortion on 05/22/21. This was an incomplete abortion and she then presented to the ED on 11/7.    Discharge Date: 06/12/21 Hospital/facility: [facility] Diagnosis: Retained products of conception, Endometritis and right sided Pyelonephritis  Procedures/tests: Renal US negative, OB US without intrauterine pregnancy with small amount of nonvascular material in the endometrial cavity. Urine culture positive for E.coli.  Consultants: OBGYN New medications: Ibuprofen, antibiotics above Discontinued medications: None Status: better  Does have some right foot pain  today as well. Pain starts around ball of foot and spreads across arch. Only in right foot. Worse in the morning and when on feet a long time. Has a history of plantar fascitis in the past.   Medical History: breast mass abscess on right in July, 2022. Was most likely a clogged milk duct as she was breastfeeding at the time, underwent an aspiration and was admitted to hospital for 7 days with breast abscess. Denies breast pain, masses/lumps or abnormal breast discharge now.   Iron deficiency anemia - not currently on oral iron, couldn't tolerate. She had to have Venofer infusions during last pregnancy in 2021.   Bipolar/GAD: sees a therapist every week and follows with Psychiatrist. Currently on Seroquel 25 mg, Latuda 40 mg, and Lamictal 150 mg daily.  She is seeing Psych in a week. Xanax as needed and at bedtime.   History of gestational diabetes in third pregnancy - never on medication, just controlled with diet. No issues with last 2 pregnancies.   Past Medical History:  Diagnosis Date   Abdominal pain 10/26/2013   Anemia 2013   Anxiety    Back pain affecting pregnancy in third trimester 02/28/2020   Depression    History of Papanicolaou smear of cervix 10/02/2016   NEG   IDA (iron deficiency anemia) 02/29/2020   Indication for care in labor and delivery, antepartum 03/07/2020   Mastitis 01/19/2021   Menometrorrhagia 10/26/2013   Postpartum galactocele 02/01/2021   Pregnancy    DELIVERED 08/12/16 - [facility]    Past Surgical History:  Procedure Laterality Date   DIAGNOSTIC LAPAROSCOPY  2018   suspected ectopic pregnancy-[facility]   DILATION AND EVACUATION N/A 06/11/2021   Procedure: D & C;  Surgeon: Will Bonnet, MD;  Location: [facility] ORS;  Service: Gynecology;  Laterality: N/A;   INCISION AND DRAINAGE ABSCESS Right 02/06/2021   Procedure: INCISION AND DRAINAGE ABSCESS;  Surgeon: Jules Husbands, MD;  Location: [facility] ORS;  Service: General;  Laterality: Right;   LAPAROSCOPY N/A 06/11/2021    Procedure: LAPAROSCOPY DIAGNOSTIC;  Surgeon: Will Bonnet, MD;  Location: [facility] ORS;  Service: Gynecology;  Laterality: N/A;   TONSILLECTOMY      Family History  Adopted: Yes  Problem Relation Age of Onset   Nevi Sister        DISPLASTIC NEVUS   Ovarian cancer Mother    Cirrhosis Father     Social History   Socioeconomic History   Marital status: Married    Spouse name: Lysbeth Galas    Number of children: 3   Years of education: 3   Highest education level: Not on file  Occupational History   Occupation: HOMEMAKER  Tobacco Use   Smoking status: Never   Smokeless tobacco: Never  Vaping Use   Vaping Use: Never used  Substance and Sexual Activity   Alcohol use: No   Drug use: No   Sexual activity: Yes    Birth control/protection: Surgical    Comment: Vasectomy or BTL  Other Topics Concern   Not on file  Social History Narrative   Not on file   Social Determinants of Health   Financial Resource Strain: Not on file  Food Insecurity: Not on file  Transportation Needs: Not on file  Physical Activity: Not on file  Stress: Not on file  Social Connections: Not on file  Intimate Partner Violence: Not on file    ROS Review of Systems  Constitutional:  Positive for fatigue. Negative for chills and fever.  Eyes:  Negative for visual disturbance.  Respiratory:  Negative for cough and shortness of breath.   Cardiovascular:  Negative for chest pain.  Gastrointestinal:  Negative for abdominal pain, nausea and vomiting.  Genitourinary:  Negative for dysuria, flank pain, frequency, hematuria, urgency, vaginal bleeding, vaginal discharge and vaginal pain.  Neurological:  Negative for dizziness and headaches.   Objective:   Today's Vitals: BP 116/72   Pulse 94   Temp 98 F (36.7 C) (Oral)   Resp 16   Ht 5\' 5"  (1.651 m)   Wt 213 lb 1.6 oz (96.7 kg)   SpO2 98%   BMI 35.46 kg/m   Physical Exam Constitutional:      Appearance: Normal appearance.  HENT:     Head:  Normocephalic and atraumatic.  Eyes:     Conjunctiva/sclera: Conjunctivae normal.  Cardiovascular:     Rate and Rhythm: Normal rate and regular rhythm.  Pulmonary:     Effort: Pulmonary effort is normal.     Breath sounds: Normal breath sounds.  Chest:  Breasts:    Right: Normal. No swelling, bleeding, inverted nipple, mass, nipple discharge, skin change or tenderness.  Abdominal:     General: There is no distension.     Palpations: Abdomen is soft.     Tenderness: There is no abdominal tenderness. There is no right CVA tenderness, left CVA tenderness or guarding.  Musculoskeletal:        General: Tenderness present. No swelling. Normal range of motion.     Right lower leg: No edema.     Left lower leg: No edema.  Comments: Tenderness to palpation along ball of foot and arch on plantar aspect of right foot.  Skin:    General: Skin is warm and dry.     Comments: Post-op scars healing well  Neurological:     General: No focal deficit present.     Mental Status: She is alert. Mental status is at baseline.  Psychiatric:        Mood and Affect: Mood normal.        Behavior: Behavior normal.    Assessment & Plan:   1. Amenorrhea/Retained products of conception after miscarriage: She is concerned that she may be pregnant again, as her LMP was in September. She did a home test about 2 weeks ago that was negative. We can do another one today, it is not abnormal to be irregular after physiologic stress with surgery but cautioned to use condoms until planned tubal ligation next month. We can recheck a urine pregnancy test today. She is following with Gynecology.   - POCT urine pregnancy  2. Pyelonephritis: Resolved. Asymptomatic, antibiotic course complete. Recheck UA in the office today negative for signs of infection.   - POCT Urinalysis Dipstick  3. Other iron deficiency anemia: Most recent CBC showing a hgb of 11.1. Has some fatigue but has 5 children. Will monitor pre-op labs in  1 month. Discussed taking an oral iron supplement every other day to help with tolerability.   4. GAD (generalized anxiety disorder)/Bipolar 1 disorder (HCC): Doesn't feel like Kasandra Knudsen is helping with manic symptoms, she is following with Psychiatry in 1 week to discuss.   5. Plantar fasciitis of right foot: Mild pain to palpation along plantar aspect of right foot consistent with plantar fascitis. Discussed more supportive footwear, stretches and anti-inflammatories as needed.   6. History of breast abscess: Resolved. Breast exam normal today.    Follow-up: Return in about 3 months (around 10/02/2021).   Margarita Mail, DO

## 2021-07-02 NOTE — Patient Instructions (Signed)
It was great seeing you today!  Plan discussed at today's visit:   Follow up in:  Take care and let us know if you have any questions or concerns prior to your next visit.  Dr. Caralee Ates      Plantar Fasciitis Plantar fasciitis is a painful foot condition that affects the heel. It occurs when the band of tissue that connects the toes to the heel bone (plantar fascia) becomes irritated. This can happen as the result of exercising too much or doing other repetitive activities (overuse injury). Plantar fasciitis can cause mild irritation to severe pain that makes it difficult to walk or move. The pain is usually worse in the morning after sleeping, or after sitting or lying down for a period of time. Pain may also be worse after long periods of walking or standing. What are the causes? This condition may be caused by: Standing for long periods of time. Wearing shoes that do not have good arch support. Doing activities that put stress on joints (high-impact activities). This includes ballet and exercise that makes your heart beat faster (aerobic exercise), such as running. Being overweight. An abnormal way of walking (gait). Tight muscles in the back of your lower leg (calf). High arches in your feet or flat feet. Starting a new athletic activity. What are the signs or symptoms? The main symptom of this condition is heel pain. Pain may get worse after the following: Taking the first steps after a time of rest, especially in the morning after awakening, or after you have been sitting or lying down for a while. Long periods of standing still. Pain may decrease after 30-45 minutes of activity, such as gentle walking. How is this diagnosed? This condition may be diagnosed based on your medical history, a physical exam, and your symptoms. Your health care provider will check for: A tender area on the bottom of your foot. A high arch in your foot or flat feet. Pain when you move your  foot. Difficulty moving your foot. You may have imaging tests to confirm the diagnosis, such as: X-rays. Ultrasound. MRI. How is this treated? Treatment for plantar fasciitis depends on how severe your condition is. Treatment may include: Rest, ice, pressure (compression), and raising (elevating) the affected foot. This is called RICE therapy. Your health care provider may recommend RICE therapy along with over-the-counter pain medicines to manage your pain. Exercises to stretch your calves and your plantar fascia. A splint that holds your foot in a stretched, upward position while you sleep (night splint). Physical therapy to relieve symptoms and prevent problems in the future. Injections of steroid medicine (cortisone) to relieve pain and inflammation. Stimulating your plantar fascia with electrical impulses (extracorporeal shock wave therapy). This is usually the last treatment option before surgery. Surgery, if other treatments have not worked after 12 months. Follow these instructions at home: Managing pain, stiffness, and swelling If directed, put ice on the painful area. To do this: Put ice in a plastic bag, or use a frozen bottle of water. Place a towel between your skin and the bag or bottle. Roll the bottom of your foot over the bag or bottle. Do this for 20 minutes, 2-3 times a day. Wear athletic shoes that have air-sole or gel-sole cushions, or try soft shoe inserts that are designed for plantar fasciitis. Elevate your foot above the level of your heart while you are sitting or lying down. Activity Avoid activities that cause pain. Ask your health care provider what activities  are safe for you. Do physical therapy exercises and stretches as told by your health care provider. Try activities and forms of exercise that are easier on your joints (low impact). Examples include swimming, water aerobics, and biking. General instructions Take over-the-counter and prescription  medicines only as told by your health care provider. Wear a night splint while sleeping, if told by your health care provider. Loosen the splint if your toes tingle, become numb, or turn cold and blue. Maintain a healthy weight, or work with your health care provider to lose weight as needed. Keep all follow-up visits. This is important. Contact a health care provider if you have: Symptoms that do not go away with home treatment. Pain that gets worse. Pain that affects your ability to move or do daily activities. Summary Plantar fasciitis is a painful foot condition that affects the heel. It occurs when the band of tissue that connects the toes to the heel bone (plantar fascia) becomes irritated. Heel pain is the main symptom of this condition. It may get worse after exercising too much or standing still for a long time. Treatment varies, but it usually starts with rest, ice, pressure (compression), and raising (elevating) the affected foot. This is called RICE therapy. Over-the-counter medicines can also be used to manage pain. This information is not intended to replace advice given to you by your health care provider. Make sure you discuss any questions you have with your health care provider. Document Revised: 11/08/2019 Document Reviewed: 11/08/2019 Elsevier Patient Education  2022 [employer].

## 2021-07-02 NOTE — Addendum Note (Signed)
Addended by: Margarita Mail on: 07/02/2021 03:39 PM   Modules accepted: Level of Service

## 2021-07-11 DIAGNOSIS — F411 Generalized anxiety disorder: Secondary | ICD-10-CM | POA: Diagnosis not present

## 2021-07-11 DIAGNOSIS — F3112 Bipolar disorder, current episode manic without psychotic features, moderate: Secondary | ICD-10-CM | POA: Diagnosis not present

## 2021-07-12 DIAGNOSIS — F3112 Bipolar disorder, current episode manic without psychotic features, moderate: Secondary | ICD-10-CM | POA: Diagnosis not present

## 2021-07-12 DIAGNOSIS — F4312 Post-traumatic stress disorder, chronic: Secondary | ICD-10-CM | POA: Diagnosis not present

## 2021-07-12 DIAGNOSIS — F411 Generalized anxiety disorder: Secondary | ICD-10-CM | POA: Diagnosis not present

## 2021-07-16 ENCOUNTER — Inpatient Hospital Stay: Admission: RE | Admit: 2021-07-16 | Payer: Medicaid Other | Source: Ambulatory Visit

## 2021-07-16 NOTE — Progress Notes (Signed)
Patient is scheduled for pre admit testing today but awaiting orders from MD. Will re-attempt to reschedule pre admit testing for tomorrow.

## 2021-07-18 ENCOUNTER — Telehealth: Payer: Self-pay

## 2021-07-18 ENCOUNTER — Encounter
Admission: RE | Admit: 2021-07-18 | Discharge: 2021-07-18 | Disposition: A | Discharge status: Home / Self Care | Payer: Medicaid Other | Source: Ambulatory Visit | Attending: Obstetrics and Gynecology | Admitting: Obstetrics and Gynecology

## 2021-07-18 ENCOUNTER — Other Ambulatory Visit: Payer: Self-pay

## 2021-07-18 VITALS — Ht 65.0 in | Wt 211.0 lb

## 2021-07-18 DIAGNOSIS — D509 Iron deficiency anemia, unspecified: Secondary | ICD-10-CM

## 2021-07-18 DIAGNOSIS — Z01812 Encounter for preprocedural laboratory examination: Secondary | ICD-10-CM

## 2021-07-18 HISTORY — DX: Other specified postprocedural states: Z98.890

## 2021-07-18 HISTORY — DX: Bipolar disorder, unspecified: F31.9

## 2021-07-18 HISTORY — DX: Other specified postprocedural states: R11.2

## 2021-07-18 NOTE — Telephone Encounter (Signed)
Denise from [employer]; pt has surg c [facility] next week but there are no orders. Adv pt has appt c [facility] tomorrow; will forward message to [facility].

## 2021-07-18 NOTE — Patient Instructions (Addendum)
Your procedure is scheduled on: December 20 Report to the Registration Desk on the 1st floor of the [facility] To find out your arrival time, please call [phone] between 1PM - 3PM on: December 19  REMEMBER: Instructions that are not followed completely may result in serious medical risk, up to and including death; or upon the discretion of your surgeon and anesthesiologist your surgery may need to be rescheduled.  Do not eat food after midnight the night before surgery.  No gum chewing, lozengers or hard candies.  You may however, drink CLEAR liquids up to 2 hours before you are scheduled to arrive for your surgery. Do not drink anything within 2 hours of your scheduled arrival time.  Clear liquids include: - water  - apple juice without pulp - gatorade (not RED, PURPLE, OR BLUE) - black coffee or tea (Do NOT add milk or creamers to the coffee or tea) Do NOT drink anything that is not on this list.  DO NOT TAKE ANY MEDICATIONS THE MORNING OF SURGERY   One week prior to surgery: Stop Anti-inflammatories (NSAIDS) such as Advil, Aleve, Ibuprofen, Motrin, Naproxen, Naprosyn and Aspirin based products such as Excedrin, Goodys Powder, BC Powder. Stop ANY OVER THE COUNTER supplements until after surgery. You may however, continue to take Tylenol if needed for pain up until the day of surgery.  No Alcohol for 24 hours before or after surgery.  No Smoking including e-cigarettes for 24 hours prior to surgery.  No chewable tobacco products for at least 6 hours prior to surgery.  No nicotine patches on the day of surgery.  Do not use any "recreational" drugs for at least a week prior to your surgery.  Please be advised that the combination of cocaine and anesthesia may have negative outcomes, up to and including death. If you test positive for cocaine, your surgery will be cancelled.  On the morning of surgery brush your teeth with toothpaste and water, you may rinse your mouth with  mouthwash if you wish. Do not swallow any toothpaste or mouthwash.  Use CHG Soap as directed on instruction sheet OR if unable to come and get the soap, shower using antibacterial soap prior to coming to the hospital on the day of surgery.  Do not wear jewelry, make-up, hairpins, clips or nail polish.  Do not wear lotions, powders, or perfumes.   Do not shave body from the neck down 48 hours prior to surgery just in case you cut yourself which could leave a site for infection.  Also, freshly shaved skin may become irritated if using the CHG soap.  Contact lenses, hearing aids and dentures may not be worn into surgery.  Do not bring valuables to the hospital. [facility] is not responsible for any missing/lost belongings or valuables.   Notify your doctor if there is any change in your medical condition (cold, fever, infection).  Wear comfortable clothing (specific to your surgery type) to the hospital.  After surgery, you can help prevent lung complications by doing breathing exercises.  Take deep breaths and cough every 1-2 hours. Your doctor may order a device called an Incentive Spirometer to help you take deep breaths. When coughing or sneezing, hold a pillow firmly against your incision with both hands. This is called splinting. Doing this helps protect your incision. It also decreases belly discomfort.  If you are being discharged the day of surgery, you will not be allowed to drive home. You will need a responsible adult (18  years or older) to drive you home and stay with you that night.   If you are taking public transportation, you will need to have a responsible adult (18 years or older) with you. Please confirm with your physician that it is acceptable to use public transportation.   Please call the Pre-admissions Testing Dept. at [phone] if you have any questions about these instructions.  Surgery Visitation Policy:  Patients undergoing a surgery or procedure  may have one family member or support person with them as long as that person is not COVID-19 positive or experiencing its symptoms.  That person may remain in the waiting area during the procedure and may rotate out with other people.

## 2021-07-19 ENCOUNTER — Encounter: Payer: Self-pay | Admitting: Obstetrics and Gynecology

## 2021-07-19 ENCOUNTER — Ambulatory Visit (INDEPENDENT_AMBULATORY_CARE_PROVIDER_SITE_OTHER): Payer: Medicaid Other | Admitting: Obstetrics and Gynecology

## 2021-07-19 VITALS — BP 118/70 | Ht 65.0 in | Wt 213.2 lb

## 2021-07-19 DIAGNOSIS — Z3009 Encounter for other general counseling and advice on contraception: Secondary | ICD-10-CM

## 2021-07-19 NOTE — H&P (View-Only) (Signed)
Patient ID: Anna Flores, female   DOB: April 18, 1991, 30 y.o.   MRN: [id number]  Reason for Consult: No chief complaint on file.   Referred by [facility]  Subjective:     HPI:  Anna Flores is a 29 y.o. female she is here today for a sterilization consultation.  She has a surgery to remove her fallopian tubes planned for next week.  She reports that she is happy with her 5 children at home especially after having a female child and feels like her family is complete.  She desires to undergo a sterilization procedure.  Gynecological History  Patient's last menstrual period was 07/08/2021 (exact date).  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   Pregnancy    DELIVERED 08/12/16 - [facility]   Family History  Adopted: Yes  Problem Relation Age of Onset   Nevi Sister        DISPLASTIC NEVUS   Ovarian cancer Mother    Cirrhosis Father    Past Surgical History:  Procedure Laterality Date   DIAGNOSTIC LAPAROSCOPY  2018   suspected ectopic pregnancy-[facility]   DILATION AND EVACUATION N/A 06/11/2021   Procedure: D & C;  Surgeon: Will Bonnet, MD;  Location: [facility] ORS;  Service: Gynecology;  Laterality: N/A;   INCISION AND DRAINAGE ABSCESS Right 02/06/2021   Procedure: INCISION AND DRAINAGE ABSCESS;  Surgeon: Jules Husbands, MD;  Location: [facility] ORS;  Service: General;  Laterality: Right;   LAPAROSCOPY N/A 06/11/2021   Procedure: LAPAROSCOPY DIAGNOSTIC;  Surgeon: Will Bonnet, MD;  Location: [facility] ORS;  Service: Gynecology;   Laterality: N/A;   TONSILLECTOMY      Short Social History:  Social History   Tobacco Use   Smoking status: Never   Smokeless tobacco: Never  Substance Use Topics   Alcohol use: Yes    Comment: occassional    Allergies  Allergen Reactions   Amoxicillin Hives   Hydrocodone Shortness Of Breath and Other (See Comments)    "loss of consciousness"; "felt really weird" when she was given 10 mgm hydrocodone. Did well with 5 mgm tablet   Vancomycin Itching    C/O head burning and itching   Reglan [Metoclopramide] Other (See Comments)   Cherry Hives    Current Outpatient Medications  Medication Sig Dispense Refill   ALPRAZolam (XANAX) 0.5 MG tablet Take 0.25 mg by mouth 2 (two) times daily as needed for sleep or anxiety.     ARIPiprazole (ABILIFY) 10 MG tablet Take 10 mg by mouth at bedtime. Take 5 mg for 14 days then increase to 10 mg daily     lamoTRIgine (LAMICTAL) 200 MG tablet Take 200 mg by mouth at bedtime.     QUEtiapine (SEROQUEL) 50 MG tablet Take 25 mg by mouth at bedtime as needed (sleep).     No current facility-administered medications for this visit.    Review of Systems  Constitutional: Negative for chills, fatigue, fever and unexpected weight change.  HENT: Negative for trouble  swallowing.  Eyes: Negative for loss of vision.  Respiratory: Negative for cough, shortness of breath and wheezing.  Cardiovascular: Negative for chest pain, leg swelling, palpitations and syncope.  GI: Negative for abdominal pain, blood in stool, diarrhea, nausea and vomiting.  GU: Negative for difficulty urinating, dysuria, frequency and hematuria.  Musculoskeletal: Negative for back pain, leg pain and joint pain.  Skin: Negative for rash.  Neurological: Negative for dizziness, headaches, light-headedness, numbness and seizures.  Psychiatric: Negative for behavioral problem, confusion, depressed mood and sleep disturbance.       Objective:  Objective   There were no vitals filed  for this visit. There is no height or weight on file to calculate BMI.  Physical Exam Vitals and nursing note reviewed. Exam conducted with a chaperone present.  Constitutional:      Appearance: Normal appearance.  HENT:     Head: Normocephalic and atraumatic.  Eyes:     Extraocular Movements: Extraocular movements intact.     Pupils: Pupils are equal, round, and reactive to light.  Cardiovascular:     Rate and Rhythm: Normal rate and regular rhythm.  Pulmonary:     Effort: Pulmonary effort is normal.     Breath sounds: Normal breath sounds.  Abdominal:     General: Abdomen is flat.     Palpations: Abdomen is soft.  Musculoskeletal:     Cervical back: Normal range of motion.  Skin:    General: Skin is warm and dry.  Neurological:     General: No focal deficit present.     Mental Status: She is alert and oriented to person, place, and time.  Psychiatric:        Behavior: Behavior normal.        Thought Content: Thought content normal.        Judgment: Judgment normal.    Assessment/Plan:     30 y.o. [id number]  Patient reports that she desires sterilization. She feels strongly that she does not desire children in the future.   We discussed alternative options for sterilization including long-acting reversible contraception methods. We discussed that sterilization procedures have a failure rate of approximately 1 in  1000.  We discussed potential surgical complications of sterile sterilization including infection damage to surrounding pelvic tissues and risk of bleeding.We discussed that sterilization procedure should be considered on reversible.  Having a reversal surgery to correct a tubal ligation is expensive, risks and ectopic pregnancy, and has high failure rates.  We discussed options of performing a tubal ligation including removing a segment of the fallopian tube or removing an entire fallopian tube.  We discussed that removal of entire fallopian tube has been associated  with a reduction in ovarian cancer risk, however this reduction in is small in her lifetime risk of ovarian cancer is approximately 1 in 100.   Patient feels sure of her decision to have a sterilization procedure.  She desires to have a laparoscopic bilateral salpingectomy.    Tubal consents signed previously in office  More than 20 minutes were spent face to face with the patient in the room, reviewing the medical record, labs and images, and coordinating care for the patient. The plan of management was discussed in detail and counseling was provided.   Adelene Idler MD, [facility], [facility] 07/19/2021 8:44 AM

## 2021-07-19 NOTE — Patient Instructions (Signed)
Salpingectomy      Salpingectomy, also called tubectomy, is the surgical removal of one of the fallopian tubes. The fallopian tubes allow eggs to travel from the ovaries to the uterus. Removing one fallopian tube does not prevent pregnancy. It also does not cause problems with your menstrual periods. You may need this procedure if you:  Have an ectopic pregnancy. This is when a fertilized egg attaches to the fallopian tube instead of the uterus. An ectopic pregnancy can cause the tube to burst or tear (rupture).  Have an infected fallopian tube.  Have cancer of the fallopian tube or nearby organs.  Have had an ovary removed due to a cyst or tumor.  Have had your uterus removed.  Are at high risk for ovarian cancer.  There are three different methods that can be used for a salpingectomy:  An open method in which one large incision is made in your abdomen.  A laparoscopic method in which a thin, lighted tube with a tiny camera (laparoscope) is used to help perform the procedure. The laparoscope allows a surgeon to make several small incisions in the abdomen instead of one large incision.  A robot-assisted method in which a computer is used to control surgical instruments that are attached to robotic arms.  Tell a health care provider about:  Any allergies you have.  All medicines you are taking, including vitamins, herbs, eye drops, creams, and over-the-counter medicines.  Any problems you or family members have had with anesthetic medicines.  Any blood disorders you have.  Any surgeries you have had.  Any medical conditions you have.  Whether you are pregnant or may be pregnant.  What are the risks?  Generally, this is a safe procedure. However, problems may occur, including:  Infection.  Bleeding.  Allergic reactions to medicines.  Blood clots in the legs or lungs.  Damage to nearby structures or organs.  What happens before the procedure?  Staying hydrated  Follow instructions from your health care provider about  hydration, which may include:  Up to 2 hours before the procedure - you may continue to drink clear liquids, such as water, clear fruit juice, black coffee, and plain tea.  Eating and drinking restrictions  Follow instructions from your health care provider about eating and drinking, which may include:  8 hours before the procedure - stop eating heavy meals or foods, such as meat, fried foods, or fatty foods.  6 hours before the procedure - stop eating light meals or foods, such as toast or cereal.  6 hours before the procedure - stop drinking milk or drinks that contain milk.  2 hours before the procedure - stop drinking clear liquids.  Medicines  Ask your health care provider about:  Changing or stopping your regular medicines. This is especially important if you are taking diabetes medicines or blood thinners.  Taking medicines such as aspirin and ibuprofen. These medicines can thin your blood. Do not take these medicines unless your health care provider tells you to take them.  Taking over-the-counter medicines, vitamins, herbs, and supplements.  General instructions  Do not use any products that contain nicotine or tobacco for at least 4 weeks before the procedure. These products include cigarettes, chewing tobacco, and vaping devices, such as e-cigarettes. If you need help quitting, ask your health care provider.  You may have an exam or tests, such as an electrocardiogram (ECG) or a blood or urine test.  Ask your health care provider:  How your surgery   site will be marked.  What steps will be taken to help prevent infection. These steps may include:  Removing hair at the surgery site.  Washing skin with a germ-killing soap.  Taking antibiotic medicine.  Plan to have a responsible adult take you home from the hospital or clinic.  If you will be going home right after the procedure, plan to have a responsible adult care for you for the time you are told. This is important.  What happens during the  procedure?  An IV will be inserted into one of your veins.  You will be given one or both of the following:  A medicine to help you relax (sedative).  A medicine to make you fall asleep (general anesthetic).  A small, thin tube (catheter) may be inserted through your urethra and into your bladder. This will drain urine during your procedure.  Depending on the type of procedure you are having, one incision or several small incisions will be made in your abdomen.  Your fallopian tube and ovary will be cut away from the uterus and removed.  Your blood vessels will be clamped and tied to prevent excess bleeding.  The incision or incisions in your abdomen will be closed with stitches (sutures), staples, or skin glue.  A bandage (dressing) may be placed over your incision or incisions.  The procedure may vary among health care providers and hospitals.  What happens after the procedure?    Your blood pressure, heart rate, breathing rate, and blood oxygen level will be monitored until you leave the hospital or clinic.  You may continue to receive fluids and medicines through an IV.  You may continue to have a catheter draining your urine.  You may have to wear compression stockings. These stockings help to prevent blood clots and reduce swelling in your legs.  You will be given pain medicine as needed.  If you were given a sedative during the procedure, it can affect you for several hours. Do not drive or operate machinery until your health care provider says that it is safe.  Summary  Salpingectomy is a surgical procedure to remove one of the fallopian tubes.  The procedure may be done with an open incision, a thin, lighted tube with a tiny camera (laparoscope), or computer-controlled instruments.  Depending on the type of procedure you have, one incision or several small incisions will be made in your abdomen.  Your blood pressure, heart rate, breathing rate, and blood oxygen level will be monitored until you leave the  hospital or clinic.  Plan to have a responsible adult take you home from the hospital or clinic.  This information is not intended to replace advice given to you by your health care provider. Make sure you discuss any questions you have with your health care provider.  Document Revised: 06/13/2020 Document Reviewed: 06/13/2020  Elsevier Patient Education  2022 [employer]

## 2021-07-19 NOTE — Progress Notes (Signed)
Patient ID: Anna Flores, female   DOB: 1990-11-18, 30 y.o.   MRN: [id number]  Reason for Consult: No chief complaint on file.   Referred by [facility]  Subjective:     HPI:  Anna Flores is a 30 y.o. female she is here today for a sterilization consultation.  She has a surgery to remove her fallopian tubes planned for next week.  She reports that she is happy with her 5 children at home especially after having a female child and feels like her family is complete.  She desires to undergo a sterilization procedure.  Gynecological History  Patient's last menstrual period was 07/08/2021 (exact date).  Past Medical History:  Diagnosis Date   Abdominal pain 10/26/2013   Anemia 2013   Anxiety    Back pain affecting pregnancy in third trimester 02/28/2020   Bipolar disorder ([place])    Depression    History of Papanicolaou smear of cervix 10/02/2016   NEG   IDA (iron deficiency anemia) 02/29/2020   Indication for care in labor and delivery, antepartum 03/07/2020   Mastitis 01/19/2021   right   Menometrorrhagia 10/26/2013   PONV (postoperative nausea and vomiting)    Postpartum galactocele 02/01/2021   Pregnancy    DELIVERED 08/12/16 - [facility]   Family History  Adopted: Yes  Problem Relation Age of Onset   Nevi Sister        DISPLASTIC NEVUS   Ovarian cancer Mother    Cirrhosis Father    Past Surgical History:  Procedure Laterality Date   DIAGNOSTIC LAPAROSCOPY  2018   suspected ectopic pregnancy-[facility]   DILATION AND EVACUATION N/A 06/11/2021   Procedure: D & C;  Surgeon: Will Bonnet, MD;  Location: [facility] ORS;  Service: Gynecology;  Laterality: N/A;   INCISION AND DRAINAGE ABSCESS Right 02/06/2021   Procedure: INCISION AND DRAINAGE ABSCESS;  Surgeon: Jules Husbands, MD;  Location: [facility] ORS;  Service: General;  Laterality: Right;   LAPAROSCOPY N/A 06/11/2021   Procedure: LAPAROSCOPY DIAGNOSTIC;  Surgeon: Will Bonnet, MD;  Location: [facility] ORS;  Service: Gynecology;   Laterality: N/A;   TONSILLECTOMY      Short Social History:  Social History   Tobacco Use   Smoking status: Never   Smokeless tobacco: Never  Substance Use Topics   Alcohol use: Yes    Comment: occassional    Allergies  Allergen Reactions   Amoxicillin Hives   Hydrocodone Shortness Of Breath and Other (See Comments)    "loss of consciousness"; "felt really weird" when she was given 10 mgm hydrocodone. Did well with 5 mgm tablet   Vancomycin Itching    C/O head burning and itching   Reglan [Metoclopramide] Other (See Comments)   Cherry Hives    Current Outpatient Medications  Medication Sig Dispense Refill   ALPRAZolam (XANAX) 0.5 MG tablet Take 0.25 mg by mouth 2 (two) times daily as needed for sleep or anxiety.     ARIPiprazole (ABILIFY) 10 MG tablet Take 10 mg by mouth at bedtime. Take 5 mg for 14 days then increase to 10 mg daily     lamoTRIgine (LAMICTAL) 200 MG tablet Take 200 mg by mouth at bedtime.     QUEtiapine (SEROQUEL) 50 MG tablet Take 25 mg by mouth at bedtime as needed (sleep).     No current facility-administered medications for this visit.    Review of Systems  Constitutional: Negative for chills, fatigue, fever and unexpected weight change.  HENT: Negative for trouble  swallowing.  Eyes: Negative for loss of vision.  Respiratory: Negative for cough, shortness of breath and wheezing.  Cardiovascular: Negative for chest pain, leg swelling, palpitations and syncope.  GI: Negative for abdominal pain, blood in stool, diarrhea, nausea and vomiting.  GU: Negative for difficulty urinating, dysuria, frequency and hematuria.  Musculoskeletal: Negative for back pain, leg pain and joint pain.  Skin: Negative for rash.  Neurological: Negative for dizziness, headaches, light-headedness, numbness and seizures.  Psychiatric: Negative for behavioral problem, confusion, depressed mood and sleep disturbance.       Objective:  Objective   There were no vitals filed  for this visit. There is no height or weight on file to calculate BMI.  Physical Exam Vitals and nursing note reviewed. Exam conducted with a chaperone present.  Constitutional:      Appearance: Normal appearance.  HENT:     Head: Normocephalic and atraumatic.  Eyes:     Extraocular Movements: Extraocular movements intact.     Pupils: Pupils are equal, round, and reactive to light.  Cardiovascular:     Rate and Rhythm: Normal rate and regular rhythm.  Pulmonary:     Effort: Pulmonary effort is normal.     Breath sounds: Normal breath sounds.  Abdominal:     General: Abdomen is flat.     Palpations: Abdomen is soft.  Musculoskeletal:     Cervical back: Normal range of motion.  Skin:    General: Skin is warm and dry.  Neurological:     General: No focal deficit present.     Mental Status: She is alert and oriented to person, place, and time.  Psychiatric:        Behavior: Behavior normal.        Thought Content: Thought content normal.        Judgment: Judgment normal.    Assessment/Plan:     30 y.o. [id number]  Patient reports that she desires sterilization. She feels strongly that she does not desire children in the future.   We discussed alternative options for sterilization including long-acting reversible contraception methods. We discussed that sterilization procedures have a failure rate of approximately 1 in  1000.  We discussed potential surgical complications of sterile sterilization including infection damage to surrounding pelvic tissues and risk of bleeding.We discussed that sterilization procedure should be considered on reversible.  Having a reversal surgery to correct a tubal ligation is expensive, risks and ectopic pregnancy, and has high failure rates.  We discussed options of performing a tubal ligation including removing a segment of the fallopian tube or removing an entire fallopian tube.  We discussed that removal of entire fallopian tube has been associated  with a reduction in ovarian cancer risk, however this reduction in is small in her lifetime risk of ovarian cancer is approximately 1 in 100.   Patient feels sure of her decision to have a sterilization procedure.  She desires to have a laparoscopic bilateral salpingectomy.    Tubal consents signed previously in office  More than 20 minutes were spent face to face with the patient in the room, reviewing the medical record, labs and images, and coordinating care for the patient. The plan of management was discussed in detail and counseling was provided.   Adelene Idler MD, [facility], [facility] 07/19/2021 8:44 AM

## 2021-07-24 ENCOUNTER — Ambulatory Visit
Admission: RE | Admit: 2021-07-24 | Discharge: 2021-07-24 | Disposition: A | Discharge status: Home / Self Care | Payer: Medicaid Other | Attending: Obstetrics and Gynecology | Admitting: Obstetrics and Gynecology

## 2021-07-24 ENCOUNTER — Encounter: Payer: Self-pay | Admitting: Obstetrics and Gynecology

## 2021-07-24 ENCOUNTER — Ambulatory Visit: Payer: Medicaid Other | Admitting: Anesthesiology

## 2021-07-24 ENCOUNTER — Encounter
Admission: RE | Disposition: A | Discharge status: Home / Self Care | Payer: Self-pay | Source: Home / Self Care | Attending: Obstetrics and Gynecology

## 2021-07-24 ENCOUNTER — Other Ambulatory Visit: Payer: Self-pay

## 2021-07-24 DIAGNOSIS — Z302 Encounter for sterilization: Secondary | ICD-10-CM | POA: Diagnosis not present

## 2021-07-24 DIAGNOSIS — N838 Other noninflammatory disorders of ovary, fallopian tube and broad ligament: Secondary | ICD-10-CM | POA: Diagnosis not present

## 2021-07-24 DIAGNOSIS — D509 Iron deficiency anemia, unspecified: Secondary | ICD-10-CM

## 2021-07-24 DIAGNOSIS — Z01812 Encounter for preprocedural laboratory examination: Secondary | ICD-10-CM

## 2021-07-24 DIAGNOSIS — Z3009 Encounter for other general counseling and advice on contraception: Secondary | ICD-10-CM

## 2021-07-24 HISTORY — PX: XI ROBOTIC ASSISTED SALPINGECTOMY: SHX6824

## 2021-07-24 LAB — CBC
HCT: 38.6 % (ref 36.0–46.0)
Hemoglobin: 12.7 g/dL (ref 12.0–15.0)
MCH: 28.2 pg (ref 26.0–34.0)
MCHC: 32.9 g/dL (ref 30.0–36.0)
MCV: 85.8 fL (ref 80.0–100.0)
Platelets: 309 10*3/uL (ref 150–400)
RBC: 4.5 MIL/uL (ref 3.87–5.11)
RDW: 12.9 % (ref 11.5–15.5)
WBC: 7.1 10*3/uL (ref 4.0–10.5)
nRBC: 0 % (ref 0.0–0.2)

## 2021-07-24 LAB — TYPE AND SCREEN
ABO/RH(D): O POS
Antibody Screen: NEGATIVE

## 2021-07-24 LAB — POCT PREGNANCY, URINE: Preg Test, Ur: NEGATIVE

## 2021-07-24 SURGERY — SALPINGECTOMY, ROBOT-ASSISTED
Anesthesia: General | Laterality: Bilateral

## 2021-07-24 MED ORDER — ROCURONIUM BROMIDE 10 MG/ML (PF) SYRINGE
PREFILLED_SYRINGE | INTRAVENOUS | Status: AC
  Filled 2021-07-24: qty 10

## 2021-07-24 MED ORDER — FENTANYL CITRATE (PF) 100 MCG/2ML IJ SOLN
INTRAMUSCULAR | Status: AC
  Filled 2021-07-24: qty 2

## 2021-07-24 MED ORDER — ONDANSETRON HCL 4 MG/2ML IJ SOLN
INTRAMUSCULAR | Status: AC
  Filled 2021-07-24: qty 2

## 2021-07-24 MED ORDER — KETOROLAC TROMETHAMINE 30 MG/ML IJ SOLN
INTRAMUSCULAR | Status: AC
  Filled 2021-07-24: qty 1

## 2021-07-24 MED ORDER — BUPIVACAINE LIPOSOME 1.3 % IJ SUSP
INTRAMUSCULAR | Status: DC | PRN
  Administered 2021-07-24: 20 mL

## 2021-07-24 MED ORDER — DEXAMETHASONE SODIUM PHOSPHATE 10 MG/ML IJ SOLN
INTRAMUSCULAR | Status: DC | PRN
  Administered 2021-07-24: 10 mg via INTRAVENOUS

## 2021-07-24 MED ORDER — ONDANSETRON HCL 4 MG/2ML IJ SOLN
INTRAMUSCULAR | Status: DC | PRN
  Administered 2021-07-24: 4 mg via INTRAVENOUS

## 2021-07-24 MED ORDER — KETOROLAC TROMETHAMINE 30 MG/ML IJ SOLN
INTRAMUSCULAR | Status: DC | PRN
  Administered 2021-07-24: 30 mg via INTRAVENOUS

## 2021-07-24 MED ORDER — FENTANYL CITRATE (PF) 100 MCG/2ML IJ SOLN
25.0000 ug | INTRAMUSCULAR | Status: AC | PRN
  Administered 2021-07-24 (×6): 25 ug via INTRAVENOUS

## 2021-07-24 MED ORDER — MEPERIDINE HCL 25 MG/ML IJ SOLN: 6.2500 mg | INTRAMUSCULAR | Status: DC | PRN

## 2021-07-24 MED ORDER — HYDROMORPHONE HCL 1 MG/ML IJ SOLN
INTRAMUSCULAR | Status: AC
  Filled 2021-07-24: qty 1

## 2021-07-24 MED ORDER — SUGAMMADEX SODIUM 200 MG/2ML IV SOLN
INTRAVENOUS | Status: DC | PRN
  Administered 2021-07-24: 200 mg via INTRAVENOUS

## 2021-07-24 MED ORDER — LACTATED RINGERS IV SOLN: INTRAVENOUS | Status: DC

## 2021-07-24 MED ORDER — MIDAZOLAM HCL 2 MG/2ML IJ SOLN
INTRAMUSCULAR | Status: AC
  Filled 2021-07-24: qty 2

## 2021-07-24 MED ORDER — PROPOFOL 10 MG/ML IV BOLUS
INTRAVENOUS | Status: DC | PRN
  Administered 2021-07-24: 25 ug/kg/min via INTRAVENOUS
  Administered 2021-07-24: 160 mg via INTRAVENOUS

## 2021-07-24 MED ORDER — ACETAMINOPHEN 10 MG/ML IV SOLN
INTRAVENOUS | Status: AC
  Filled 2021-07-24: qty 100

## 2021-07-24 MED ORDER — DEXMEDETOMIDINE HCL IN NACL 200 MCG/50ML IV SOLN
INTRAVENOUS | Status: AC
  Filled 2021-07-24: qty 50

## 2021-07-24 MED ORDER — OXYCODONE HCL 5 MG PO TABS
5.0000 mg | ORAL_TABLET | Freq: Three times a day (TID) | ORAL | Status: DC | PRN
  Administered 2021-07-24: 11:00:00 5 mg via ORAL

## 2021-07-24 MED ORDER — DEXMEDETOMIDINE (PRECEDEX) IN NS 20 MCG/5ML (4 MCG/ML) IV SYRINGE
PREFILLED_SYRINGE | INTRAVENOUS | Status: DC | PRN
  Administered 2021-07-24: 8 ug via INTRAVENOUS

## 2021-07-24 MED ORDER — MIDAZOLAM HCL 2 MG/2ML IJ SOLN
INTRAMUSCULAR | Status: DC | PRN
  Administered 2021-07-24: 2 mg via INTRAVENOUS

## 2021-07-24 MED ORDER — HYDROMORPHONE HCL 1 MG/ML IJ SOLN: 0.5000 mg | INTRAMUSCULAR | Status: DC | PRN

## 2021-07-24 MED ORDER — POVIDONE-IODINE 10 % EX SWAB: 2.0000 "application " | Freq: Once | CUTANEOUS | Status: DC

## 2021-07-24 MED ORDER — ROCURONIUM BROMIDE 100 MG/10ML IV SOLN
INTRAVENOUS | Status: DC | PRN
  Administered 2021-07-24: 50 mg via INTRAVENOUS
  Administered 2021-07-24: 20 mg via INTRAVENOUS

## 2021-07-24 MED ORDER — IBUPROFEN 600 MG PO TABS: 600.0000 mg | ORAL_TABLET | Freq: Four times a day (QID) | ORAL | 0 refills | Status: DC | PRN

## 2021-07-24 MED ORDER — OXYCODONE HCL 5 MG PO TABS: 5.0000 mg | ORAL_TABLET | Freq: Three times a day (TID) | ORAL | 0 refills | Status: DC | PRN

## 2021-07-24 MED ORDER — CHLORHEXIDINE GLUCONATE 0.12 % MT SOLN
OROMUCOSAL | Status: AC
  Administered 2021-07-24: 06:00:00 15 mL via OROMUCOSAL
  Filled 2021-07-24: qty 15

## 2021-07-24 MED ORDER — PROPOFOL 500 MG/50ML IV EMUL
INTRAVENOUS | Status: AC
  Filled 2021-07-24: qty 50

## 2021-07-24 MED ORDER — OXYCODONE HCL 5 MG PO TABS
ORAL_TABLET | ORAL | Status: AC
  Filled 2021-07-24: qty 1

## 2021-07-24 MED ORDER — LIDOCAINE HCL (CARDIAC) PF 100 MG/5ML IV SOSY
PREFILLED_SYRINGE | INTRAVENOUS | Status: DC | PRN
  Administered 2021-07-24: 50 mg via INTRAVENOUS

## 2021-07-24 MED ORDER — CHLORHEXIDINE GLUCONATE 0.12 % MT SOLN: 15.0000 mL | Freq: Once | OROMUCOSAL | Status: AC

## 2021-07-24 MED ORDER — ACETAMINOPHEN 10 MG/ML IV SOLN
INTRAVENOUS | Status: DC | PRN
  Administered 2021-07-24: 1000 mg via INTRAVENOUS

## 2021-07-24 MED ORDER — PHENYLEPHRINE HCL (PRESSORS) 10 MG/ML IV SOLN
INTRAVENOUS | Status: DC | PRN
  Administered 2021-07-24: 100 ug via INTRAVENOUS

## 2021-07-24 MED ORDER — APREPITANT 40 MG PO CAPS
ORAL_CAPSULE | ORAL | Status: AC
  Administered 2021-07-24: 06:00:00 40 mg via ORAL
  Filled 2021-07-24: qty 1

## 2021-07-24 MED ORDER — PROPOFOL 10 MG/ML IV BOLUS
INTRAVENOUS | Status: AC
  Filled 2021-07-24: qty 20

## 2021-07-24 MED ORDER — PHENYLEPHRINE HCL (PRESSORS) 10 MG/ML IV SOLN
INTRAVENOUS | Status: AC
  Filled 2021-07-24: qty 1

## 2021-07-24 MED ORDER — LIDOCAINE HCL (PF) 2 % IJ SOLN
INTRAMUSCULAR | Status: AC
  Filled 2021-07-24: qty 5

## 2021-07-24 MED ORDER — ORAL CARE MOUTH RINSE: 15.0000 mL | Freq: Once | OROMUCOSAL | Status: AC

## 2021-07-24 MED ORDER — FAMOTIDINE 20 MG PO TABS: 20.0000 mg | ORAL_TABLET | Freq: Once | ORAL | Status: AC

## 2021-07-24 MED ORDER — ONDANSETRON HCL 4 MG/2ML IJ SOLN
4.0000 mg | Freq: Once | INTRAMUSCULAR | Status: AC | PRN
  Administered 2021-07-24: 10:00:00 4 mg via INTRAVENOUS

## 2021-07-24 MED ORDER — APREPITANT 40 MG PO CAPS: 40.0000 mg | ORAL_CAPSULE | Freq: Once | ORAL | Status: AC

## 2021-07-24 MED ORDER — FAMOTIDINE 20 MG PO TABS
ORAL_TABLET | ORAL | Status: AC
  Administered 2021-07-24: 06:00:00 20 mg via ORAL
  Filled 2021-07-24: qty 1

## 2021-07-24 MED ORDER — FENTANYL CITRATE (PF) 100 MCG/2ML IJ SOLN
INTRAMUSCULAR | Status: DC | PRN
  Administered 2021-07-24 (×2): 50 ug via INTRAVENOUS

## 2021-07-24 MED ORDER — BUPIVACAINE LIPOSOME 1.3 % IJ SUSP
INTRAMUSCULAR | Status: AC
  Filled 2021-07-24: qty 20

## 2021-07-24 MED ORDER — DEXAMETHASONE SODIUM PHOSPHATE 10 MG/ML IJ SOLN
INTRAMUSCULAR | Status: AC
  Filled 2021-07-24: qty 1

## 2021-07-24 MED ORDER — 0.9 % SODIUM CHLORIDE (POUR BTL) OPTIME
TOPICAL | Status: DC | PRN
  Administered 2021-07-24: 09:00:00 100 mL

## 2021-07-24 SURGICAL SUPPLY — 61 items
ADH SKN CLS APL DERMABOND .7 (GAUZE/BANDAGES/DRESSINGS) ×1
APL PRP STRL LF DISP 70% ISPRP (MISCELLANEOUS) ×1
BAG DRN RND TRDRP ANRFLXCHMBR (UROLOGICAL SUPPLIES) ×1
BAG URINE DRAIN 2000ML AR STRL (UROLOGICAL SUPPLIES) ×2 IMPLANT
BASIN GRAD PLASTIC 32OZ STRL (MISCELLANEOUS) ×3 IMPLANT
BLADE SURG SZ10 CARB STEEL (BLADE) IMPLANT
BLADE SURG SZ11 CARB STEEL (BLADE) ×2 IMPLANT
CATH FOLEY SIL 2WAY 14FR5CC (CATHETERS) ×2 IMPLANT
CATH ROBINSON RED A/P 16FR (CATHETERS) ×1 IMPLANT
CHLORAPREP W/TINT 26 (MISCELLANEOUS) ×3 IMPLANT
COVER MAYO STAND REUSABLE (DRAPES) ×3 IMPLANT
COVER WAND RF STERILE (DRAPES) ×3 IMPLANT
DERMABOND ADVANCED (GAUZE/BANDAGES/DRESSINGS) ×2
DERMABOND ADVANCED .7 DNX12 (GAUZE/BANDAGES/DRESSINGS) ×1 IMPLANT
DRAPE ARM DVNC X/XI (DISPOSABLE) ×3 IMPLANT
DRAPE COLUMN DVNC XI (DISPOSABLE) ×1 IMPLANT
DRAPE DA VINCI XI ARM (DISPOSABLE) ×9
DRAPE DA VINCI XI COLUMN (DISPOSABLE) ×3
DRAPE ROBOT W/ LEGGING 30X125 (DRAPES) ×3 IMPLANT
DRSG TEGADERM 2-3/8X2-3/4 SM (GAUZE/BANDAGES/DRESSINGS) ×9 IMPLANT
ELECT REM PT RETURN 9FT ADLT (ELECTROSURGICAL) ×3
ELECTRODE REM PT RTRN 9FT ADLT (ELECTROSURGICAL) ×1 IMPLANT
GAUZE 4X4 16PLY ~~LOC~~+RFID DBL (SPONGE) ×3 IMPLANT
GLOVE SURG ENC MOIS LTX SZ7 (GLOVE) ×6 IMPLANT
GLOVE SURG UNDER POLY LF SZ7.5 (GLOVE) ×6 IMPLANT
GOWN STRL REUS W/ TWL LRG LVL3 (GOWN DISPOSABLE) ×8 IMPLANT
GOWN STRL REUS W/TWL LRG LVL3 (GOWN DISPOSABLE) ×15
GRASPER SUT TROCAR 14GX15 (MISCELLANEOUS) ×3 IMPLANT
IRRIGATOR SUCT 8 DISP DVNC XI (IRRIGATION / IRRIGATOR) IMPLANT
IRRIGATOR SUCTION 8MM XI DISP (IRRIGATION / IRRIGATOR)
IV NS 1000ML (IV SOLUTION)
IV NS 1000ML BAXH (IV SOLUTION) IMPLANT
KIT PINK PAD W/HEAD ARE REST (MISCELLANEOUS) ×3
KIT PINK PAD W/HEAD ARM REST (MISCELLANEOUS) ×1 IMPLANT
LABEL OR SOLS (LABEL) ×3 IMPLANT
MANIFOLD NEPTUNE II (INSTRUMENTS) ×3 IMPLANT
MANIPULATOR UTERINE 4.5 ZUMI (MISCELLANEOUS) ×3 IMPLANT
NEEDLE HYPO 22GX1.5 SAFETY (NEEDLE) ×3 IMPLANT
NS IRRIG 1000ML POUR BTL (IV SOLUTION) ×4 IMPLANT
OBTURATOR OPTICAL STANDARD 8MM (TROCAR) ×3
OBTURATOR OPTICAL STND 8 DVNC (TROCAR) ×1
OBTURATOR OPTICALSTD 8 DVNC (TROCAR) ×1 IMPLANT
PACK GYN LAPAROSCOPIC (MISCELLANEOUS) ×3 IMPLANT
PAD ARMBOARD 7.5X6 YLW CONV (MISCELLANEOUS) ×3 IMPLANT
PAD OB MATERNITY 4.3X12.25 (PERSONAL CARE ITEMS) ×3 IMPLANT
PAD PREP 24X41 OB/GYN DISP (PERSONAL CARE ITEMS) ×3 IMPLANT
SCRUB EXIDINE 4% CHG 4OZ (MISCELLANEOUS) ×3 IMPLANT
SEAL CANN UNIV 5-8 DVNC XI (MISCELLANEOUS) ×3 IMPLANT
SEAL XI 5MM-8MM UNIVERSAL (MISCELLANEOUS) ×9
SEALER VESSEL DA VINCI XI (MISCELLANEOUS) ×3
SEALER VESSEL EXT DVNC XI (MISCELLANEOUS) ×1 IMPLANT
SET TUBE SMOKE EVAC HIGH FLOW (TUBING) ×3 IMPLANT
SPONGE GAUZE 2X2 8PLY STER LF (GAUZE/BANDAGES/DRESSINGS) ×2
SPONGE GAUZE 2X2 8PLY STRL LF (GAUZE/BANDAGES/DRESSINGS) ×5 IMPLANT
SURGILUBE 2OZ TUBE FLIPTOP (MISCELLANEOUS) ×3 IMPLANT
SUT MNCRL 4-0 (SUTURE) ×3
SUT MNCRL 4-0 27XMFL (SUTURE) ×1
SUT VIC AB 0 CT1 36 (SUTURE) IMPLANT
SUTURE MNCRL 4-0 27XMF (SUTURE) ×1 IMPLANT
SYR 10ML LL (SYRINGE) ×3 IMPLANT
WATER STERILE IRR 500ML POUR (IV SOLUTION) ×3 IMPLANT

## 2021-07-24 NOTE — Transfer of Care (Signed)
Immediate Anesthesia Transfer of Care Note  Patient: Anna Flores  Procedure(s) Performed: XI ROBOTIC ASSISTED SALPINGECTOMY (Bilateral)  Patient Location: PACU  Anesthesia Type:General  Level of Consciousness: awake, alert  and oriented  Airway & Oxygen Therapy: Patient Spontanous Breathing  Post-op Assessment: Report given to RN and Post -op Vital signs reviewed and stable  Post vital signs: Reviewed and stable  Last Vitals:  Vitals Value Taken Time  BP 88/75 07/24/21 0916  Temp    Pulse 72 07/24/21 0922  Resp 16 07/24/21 0922  SpO2 100 % 07/24/21 0922  Vitals shown include unvalidated device data.  Last Pain:  Vitals:   07/24/21 0612  TempSrc: Temporal  PainSc: 0-No pain         Complications: No notable events documented.

## 2021-07-24 NOTE — Op Note (Signed)
Operative Note   PRE-OP DIAGNOSIS: Desires Sterilization     POST-OP DIAGNOSIS: Desires Sterilization    SURGEON: Adelene Idler MD  ANESTHESIA: General  PROCEDURE: Procedure(s):Robotic assisted laparoscopic bilateral salpingectomy    ESTIMATED BLOOD LOSS: 5 cc  DRAINS: Foley  SPECIMENS: Bilateral fallopian tubes    COMPLICATIONS: None  DISPOSITION: PACU  CONDITION: Stable  INDICATIONS: Desires sterilization  FINDINGS: Exam under anesthesia revealed an 8 week uterus. There was without adnexal masses or nodularity. The parametria was smooth. The cervix was negative for gross lesions. Intraoperative findings included: The uterus was  grossly normal. The adnexa was  normal bilaterally.   PROCEDURE IN DETAIL: After informed consent was obtained, the patient was taken to the operating room where anesthesia was obtained without difficulty. The patient was positioned in the dorsal lithotomy position in [place] stirrups and her arms were carefully tucked at her sides and the usual precautions were taken.  She was prepped and draped in normal sterile fashion.  Time-out was performed. A foley catheter was placed. A speculum was placed in the vagina and the cervical os was dilated. The uterus sounded to 8 cm.  A standard zumi uterine manipulator was then placed in the uterus without incident.     Laparoscopic entry was obtained via an umbilical incision and direct entry. The 8 mm robotic optiview port was placed, abdomen insuffulated, and pelvis visualized with noted findings above.  The patient was placed in Trendelenburg and the bowel was displaced up into the upper abdomen.  The 2 additional robotic port were placed in a horizontal line across the upper abdomen. Robotic docking was performed.  The right and left fallopian tubes were identified. The vessel sealer was used to seal and excise the right and then the left fallopian tube. The fallopian tubes were passed through the robotic port.   Excellent hemostasis was noted.      The instruments were then all removed from the abdomen. The robot was undocked. The air was expelled from the abdomen. The trocars were removed. The skin was closed with 4-0 monocryl with a subcuticular stitch and Indermil glue.  The patient tolerated the procedure well.  Sponge, lap and needle counts were correct x2. The patient was taken to recovery room in excellent condition.  The foley was removed from the bladder. The uterine manipulator was removed from the uterus.    Adelene Idler MD, [facility], [facility] 07/24/2021 9:11 AM

## 2021-07-24 NOTE — Anesthesia Preprocedure Evaluation (Signed)
Anesthesia Evaluation  Patient identified by MRN, date of birth, ID band Patient awake    Reviewed: Allergy & Precautions, NPO status , Patient's Chart, lab work & pertinent test results  History of Anesthesia Complications (+) PONV and history of anesthetic complications ([place] With Zofran)  Airway Mallampati: II  TM Distance: >3 FB Neck ROM: Full    Dental no notable dental hx.    Pulmonary neg pulmonary ROS,    Pulmonary exam normal breath sounds clear to auscultation       Cardiovascular negative cardio ROS Normal cardiovascular exam Rhythm:Regular Rate:Normal     Neuro/Psych PSYCHIATRIC DISORDERS Anxiety Depression Bipolar Disorder negative neurological ROS     GI/Hepatic negative GI ROS, Neg liver ROS,   Endo/Other  negative endocrine ROS  Renal/GU negative Renal ROS  negative genitourinary   Musculoskeletal negative musculoskeletal ROS (+)   Abdominal (+) + obese,   Peds negative pediatric ROS (+)  Hematology negative hematology ROS (+) anemia ,   Anesthesia Other Findings Abdominal pain 10/26/2013  Anemia 2013   Anxiety    Back pain affecting pregnancy in third trimester 02/28/2020   Bipolar disorder ([facility])   Depression    History of Papanicolaou smear of cervix 10/02/2016 NEG  IDA (iron deficiency anemia) 02/29/2020 Indication for care in labor and delivery, antepartum 03/07/2020   Mastitis 01/19/2021 right  Menometrorrhagia 10/26/2013  PONV (postoperative nausea and vomiting) Postpartum galactocele       Reproductive/Obstetrics negative OB ROS                            Anesthesia Physical  Anesthesia Plan  ASA: 2  Anesthesia Plan: General   Post-op Pain Management:    Induction: Intravenous  PONV Risk Score and Plan: 3 and Ondansetron, Dexamethasone and Midazolam  Airway Management Planned: Oral ETT  Additional Equipment:   Intra-op Plan:    Post-operative Plan: Extubation in OR  Informed Consent: I have reviewed the patients History and Physical, chart, labs and discussed the procedure including the risks, benefits and alternatives for the proposed anesthesia with the patient or authorized representative who has indicated his/her understanding and acceptance.     Dental advisory given  Plan Discussed with: Anesthesiologist, CRNA and Surgeon  Anesthesia Plan Comments:        Anesthesia Quick Evaluation

## 2021-07-24 NOTE — Interval H&P Note (Signed)
History and Physical Interval Note:  07/24/2021 7:32 AM  Anna Flores  has presented today for surgery, with the diagnosis of Sterilization.  The various methods of treatment have been discussed with the patient and family. After consideration of risks, benefits and other options for treatment, the patient has consented to  Procedure(s): XI ROBOTIC ASSISTED SALPINGECTOMY (Bilateral) as a surgical intervention.  The patient's history has been reviewed, patient examined, no change in status, stable for surgery.  I have reviewed the patient's chart and labs.  Questions were answered to the patient's satisfaction.     Caitlin Hillmer R Navi Erber

## 2021-07-24 NOTE — Anesthesia Postprocedure Evaluation (Signed)
Anesthesia Post Note  Patient: Anna Flores  Procedure(s) Performed: XI ROBOTIC ASSISTED SALPINGECTOMY (Bilateral)  Patient location during evaluation: PACU Anesthesia Type: General Level of consciousness: awake and alert, awake and oriented Pain management: pain level controlled Vital Signs Assessment: post-procedure vital signs reviewed and stable Respiratory status: spontaneous breathing, nonlabored ventilation and respiratory function stable Cardiovascular status: blood pressure returned to baseline and stable Postop Assessment: no apparent nausea or vomiting Anesthetic complications: no   No notable events documented.   Last Vitals:  Vitals:   07/24/21 1043 07/24/21 1053  BP:  128/75  Pulse: 83 86  Resp: 14 16  Temp:  36.7 C  SpO2: 99% 100%    Last Pain:  Vitals:   07/24/21 1053  TempSrc: Temporal  PainSc: 4                  Manfred Arch

## 2021-07-24 NOTE — Discharge Instructions (Addendum)
Postoperative Instructions  Take Motrin 600 mg and Tylenol 1000 mg every 6 hours for pain control.   I recommend taking this medication consistently for at least 1 week after your surgery.  Take Roxicodone 5 mg as needed every 6 hours for severe pain.  Some people will take this before bed to make it easier to sleep.  Do not take this pain medicine longer than 7 days after your surgery.  Please take small walks around your living space every 2-3 hours after your surgery.  You can start this the day of your surgery or the day after your surgery.  Continue this for the first 7 days after surgery. After the first 7 days you can slowly increase you activity.  Do not lift heavy objects for 6 to 12 weeks after the surgery.  Nothing in the vagina until it is well-healed.  We will examine your vagina in the office and I will clear you to return to heavy lifting, sexual activity, and tampon usage.  Try not to strain with bowel movements.  It can be a good idea to take an over-the-counter stool softener after your surgery.  You can take Colace 100 mg twice a day.  Try to drink 60 to 90 ounces of water a day to keep your stools soft.  After the surgery your diet should consist of foods that are easy on your stomach.  Soups, sandwiches, crackers, rice, applesauce, popsicles, mashed potatoes, cooked vegetables or canned fruits are best.  After you have a normal bowel movement you can return to your regular diet.  You should have a bowel movement within 3 days of the surgery.  If you not able to have a bowel movement please contact her office so we can review over-the-counter options to help have a bowel movement.  Walking regularly will help with having a bowel movement.  If you have a bandage covering your incisions he could remove it the day after your surgery.  Your incisions are closed with dissolvable suture and a topical glue.  The topical glue you can peel off gently in the shower 2 weeks after your  surgery.  You should shower every day after the surgery.  You can let soapy water gently run over the incisions to keep them clean.  Please contact us if your incisions appear infected or if you have any drainage of fluid from the incisions.  I would like to know about these symptoms same day since wound infections can get bad fast.    AMBULATORY SURGERY  DISCHARGE INSTRUCTIONS   The drugs that you were given will stay in your system until tomorrow so for the next 24 hours you should not:  Drive an automobile Make any legal decisions Drink any alcoholic beverage   You may resume regular meals tomorrow.  Today it is better to start with liquids and gradually work up to solid foods.  You may eat anything you prefer, but it is better to start with liquids, then soup and crackers, and gradually work up to solid foods.   Please notify your doctor immediately if you have any unusual bleeding, trouble breathing, redness and pain at the surgery site, drainage, fever, or pain not relieved by medication.    Additional Instructions:   Leave green arm band on for 4 days  Please contact your physician with any problems or Same Day Surgery at [phone], Monday through Friday 6 am to 4 pm, or [place] [facility] number at [phone].

## 2021-07-24 NOTE — Anesthesia Procedure Notes (Signed)
Procedure Name: Intubation Date/Time: 07/24/2021 7:49 AM Performed by: Debe Coder, CRNA Pre-anesthesia Checklist: Patient identified, Emergency Drugs available, Suction available and Patient being monitored Patient Re-evaluated:Patient Re-evaluated prior to induction Oxygen Delivery Method: Circle system utilized Preoxygenation: Pre-oxygenation with 100% oxygen Induction Type: IV induction Ventilation: Mask ventilation without difficulty Laryngoscope Size: Mac and 3 Grade View: Grade I Tube type: Oral Tube size: 7.0 mm Number of attempts: 1 Airway Equipment and Method: Stylet and Oral airway Placement Confirmation: ETT inserted through vocal cords under direct vision, positive ETCO2 and breath sounds checked- equal and bilateral Secured at: 21 cm Tube secured with: Tape Dental Injury: Teeth and Oropharynx as per pre-operative assessment

## 2021-07-25 DIAGNOSIS — F3112 Bipolar disorder, current episode manic without psychotic features, moderate: Secondary | ICD-10-CM | POA: Diagnosis not present

## 2021-07-25 DIAGNOSIS — F411 Generalized anxiety disorder: Secondary | ICD-10-CM | POA: Diagnosis not present

## 2021-07-25 LAB — SURGICAL PATHOLOGY

## 2021-07-26 ENCOUNTER — Other Ambulatory Visit: Payer: Self-pay

## 2021-07-26 ENCOUNTER — Encounter: Payer: Self-pay | Admitting: Obstetrics and Gynecology

## 2021-07-26 ENCOUNTER — Emergency Department
Admission: EM | Admit: 2021-07-26 | Discharge: 2021-07-26 | Disposition: A | Discharge status: Home / Self Care | Payer: Medicaid Other | Attending: Student in an Organized Health Care Education/Training Program | Admitting: Student in an Organized Health Care Education/Training Program

## 2021-07-26 ENCOUNTER — Other Ambulatory Visit: Payer: Self-pay | Admitting: Obstetrics and Gynecology

## 2021-07-26 ENCOUNTER — Emergency Department: Payer: Medicaid Other

## 2021-07-26 DIAGNOSIS — S299XXA Unspecified injury of thorax, initial encounter: Secondary | ICD-10-CM | POA: Diagnosis present

## 2021-07-26 DIAGNOSIS — G8918 Other acute postprocedural pain: Secondary | ICD-10-CM | POA: Diagnosis not present

## 2021-07-26 DIAGNOSIS — R109 Unspecified abdominal pain: Secondary | ICD-10-CM | POA: Diagnosis not present

## 2021-07-26 DIAGNOSIS — Y92410 Unspecified street and highway as the place of occurrence of the external cause: Secondary | ICD-10-CM | POA: Insufficient documentation

## 2021-07-26 DIAGNOSIS — S22080A Wedge compression fracture of T11-T12 vertebra, initial encounter for closed fracture: Secondary | ICD-10-CM | POA: Insufficient documentation

## 2021-07-26 DIAGNOSIS — R110 Nausea: Secondary | ICD-10-CM

## 2021-07-26 LAB — COMPREHENSIVE METABOLIC PANEL
ALT: 15 U/L (ref 0–44)
AST: 15 U/L (ref 15–41)
Albumin: 4.4 g/dL (ref 3.5–5.0)
Alkaline Phosphatase: 56 U/L (ref 38–126)
Anion gap: 7 (ref 5–15)
BUN: 13 mg/dL (ref 6–20)
CO2: 28 mmol/L (ref 22–32)
Calcium: 9.2 mg/dL (ref 8.9–10.3)
Chloride: 102 mmol/L (ref 98–111)
Creatinine, Ser: 0.77 mg/dL (ref 0.44–1.00)
GFR, Estimated: >60 mL/min (ref >60)
Glucose, Bld: 93 mg/dL (ref 70–99)
Potassium: 3.8 mmol/L (ref 3.5–5.1)
Sodium: 137 mmol/L (ref 135–145)
Total Bilirubin: 0.8 mg/dL (ref 0.3–1.2)
Total Protein: 7.4 g/dL (ref 6.5–8.1)

## 2021-07-26 LAB — URINALYSIS, ROUTINE W REFLEX MICROSCOPIC
Bilirubin Urine: NEGATIVE
Glucose, UA: NEGATIVE mg/dL
Ketones, ur: NEGATIVE mg/dL
Leukocytes,Ua: NEGATIVE
Nitrite: NEGATIVE
Protein, ur: NEGATIVE mg/dL
Specific Gravity, Urine: <1.005 — ABNORMAL LOW (ref 1.005–1.030)
pH: 6 (ref 5.0–8.0)

## 2021-07-26 LAB — CBC WITH DIFFERENTIAL/PLATELET
Abs Immature Granulocytes: 0.03 10*3/uL (ref 0.00–0.07)
Basophils Absolute: 0 10*3/uL (ref 0.0–0.1)
Basophils Relative: 0 %
Eosinophils Absolute: 0.1 10*3/uL (ref 0.0–0.5)
Eosinophils Relative: 1 %
HCT: 40.5 % (ref 36.0–46.0)
Hemoglobin: 13 g/dL (ref 12.0–15.0)
Immature Granulocytes: 0 %
Lymphocytes Relative: 32 %
Lymphs Abs: 2.7 10*3/uL (ref 0.7–4.0)
MCH: 28.3 pg (ref 26.0–34.0)
MCHC: 32.1 g/dL (ref 30.0–36.0)
MCV: 88 fL (ref 80.0–100.0)
Monocytes Absolute: 0.6 10*3/uL (ref 0.1–1.0)
Monocytes Relative: 8 %
Neutro Abs: 4.9 10*3/uL (ref 1.7–7.7)
Neutrophils Relative %: 59 %
Platelets: 289 10*3/uL (ref 150–400)
RBC: 4.6 MIL/uL (ref 3.87–5.11)
RDW: 13.1 % (ref 11.5–15.5)
WBC: 8.5 10*3/uL (ref 4.0–10.5)
nRBC: 0 % (ref 0.0–0.2)

## 2021-07-26 LAB — URINALYSIS, MICROSCOPIC (REFLEX)
Bacteria, UA: NONE SEEN
WBC, UA: NONE SEEN WBC/hpf (ref 0–5)

## 2021-07-26 MED ORDER — MELOXICAM 15 MG PO TABS: 15.0000 mg | ORAL_TABLET | Freq: Every day | ORAL | 0 refills | Status: DC

## 2021-07-26 MED ORDER — IOHEXOL 300 MG/ML  SOLN
100.0000 mL | Freq: Once | INTRAMUSCULAR | Status: AC | PRN
  Administered 2021-07-26: 17:00:00 100 mL via INTRAVENOUS
  Filled 2021-07-26: qty 100

## 2021-07-26 MED ORDER — ONDANSETRON HCL 4 MG/2ML IJ SOLN
4.0000 mg | Freq: Once | INTRAMUSCULAR | Status: AC
  Administered 2021-07-26: 17:00:00 4 mg via INTRAVENOUS
  Filled 2021-07-26: qty 2

## 2021-07-26 MED ORDER — METHOCARBAMOL 500 MG PO TABS: 500.0000 mg | ORAL_TABLET | Freq: Four times a day (QID) | ORAL | 0 refills | Status: DC

## 2021-07-26 MED ORDER — OXYCODONE-ACETAMINOPHEN 5-325 MG PO TABS: 1.0000 | ORAL_TABLET | Freq: Four times a day (QID) | ORAL | 0 refills | Status: DC | PRN

## 2021-07-26 MED ORDER — MORPHINE SULFATE (PF) 4 MG/ML IV SOLN
4.0000 mg | Freq: Once | INTRAVENOUS | Status: AC
  Administered 2021-07-26: 20:00:00 4 mg via INTRAVENOUS
  Filled 2021-07-26: qty 1

## 2021-07-26 MED ORDER — KETOROLAC TROMETHAMINE 30 MG/ML IJ SOLN
30.0000 mg | Freq: Once | INTRAMUSCULAR | Status: AC
  Administered 2021-07-26: 20:00:00 30 mg via INTRAVENOUS
  Filled 2021-07-26: qty 1

## 2021-07-26 MED ORDER — ONDANSETRON 4 MG PO TBDP: 4.0000 mg | ORAL_TABLET | Freq: Three times a day (TID) | ORAL | 0 refills | Status: DC | PRN

## 2021-07-26 MED ORDER — ORPHENADRINE CITRATE 30 MG/ML IJ SOLN
60.0000 mg | Freq: Once | INTRAMUSCULAR | Status: AC
  Administered 2021-07-26: 20:00:00 60 mg via INTRAVENOUS
  Filled 2021-07-26: qty 2

## 2021-07-26 MED ORDER — MORPHINE SULFATE (PF) 4 MG/ML IV SOLN
4.0000 mg | Freq: Once | INTRAVENOUS | Status: AC
  Administered 2021-07-26: 17:00:00 4 mg via INTRAVENOUS
  Filled 2021-07-26: qty 1

## 2021-07-26 MED ORDER — SODIUM CHLORIDE 0.9 % IV BOLUS
1000.0000 mL | Freq: Once | INTRAVENOUS | Status: AC
  Administered 2021-07-26: 16:00:00 1000 mL via INTRAVENOUS

## 2021-07-26 NOTE — ED Triage Notes (Signed)
Pt states that on Saturday a car fell on her husbands arm and she tried to lift it off- however pt also had surgery Tuesday to get both fallopian tubes removed- pt is here for lower back pain and pain at the incision sight from her surgery- pt states the pain meds that they sent her home with after surgery have not helped

## 2021-07-26 NOTE — ED Notes (Signed)
Patient states she just voided and can't give a specimen at this time.
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
## 2021-08-07 ENCOUNTER — Encounter: Payer: Self-pay | Admitting: Internal Medicine

## 2021-08-07 ENCOUNTER — Ambulatory Visit: Payer: Medicaid Other | Admitting: Internal Medicine

## 2021-08-07 VITALS — BP 104/66 | HR 76 | Temp 98.3°F | Resp 16 | Ht 65.0 in | Wt 215.4 lb

## 2021-08-07 DIAGNOSIS — S22080D Wedge compression fracture of T11-T12 vertebra, subsequent encounter for fracture with routine healing: Secondary | ICD-10-CM | POA: Diagnosis not present

## 2021-08-07 MED ORDER — TIZANIDINE HCL 4 MG PO TABS: 4.0000 mg | ORAL_TABLET | Freq: Four times a day (QID) | ORAL | 0 refills | Status: DC | PRN

## 2021-08-07 MED ORDER — LIDOCAINE 4 % EX PTCH: 1.0000 | MEDICATED_PATCH | Freq: Every day | CUTANEOUS | 1 refills | Status: DC

## 2021-08-07 NOTE — Patient Instructions (Addendum)
It was great seeing you today!  Plan discussed at today's visit: -Lidocaine patches sent to pharmacy as well as different muscle relaxer -Continue Mobic as needed but do NOT take any other anti-inflammatories like Advil, Aleve, etc. It is ok to alternate with Tylenol   Follow up in: already scheduled 2/28  Take care and let us know if you have any questions or concerns prior to your next visit.  Dr. Rosana Berger    Spinal Compression Fracture A spinal compression fracture is a collapse of the bones that form the spine (vertebrae). With this type of fracture, the vertebrae become pushed (compressed) into a wedge shape. Most compression fractures happen in the middle or lower part of the spine. What are the causes? This condition may be caused by: Thinning and loss of density in the bones (osteoporosis). This is the most common cause. A fall. A car or motorcycle accident. Cancer. Trauma, such as a heavy, direct hit to the head or back. What increases the risk? You are more likely to develop this condition if: You are 104 years of age or older. You have osteoporosis. You have certain types of cancer, including: Multiple myeloma. Lymphoma. Prostate cancer. Lung cancer. Breast cancer. What are the signs or symptoms? Symptoms of this condition include: Severe pain with simple movements such as coughing or sneezing. Pain that gets worse over time. Pain that is worse when you stand, walk, sit, or bend. Sudden pain that is so bad that it is hard for you to move. Bending or humping of the spine. Gradual loss of height. Numbness, tingling, or weakness in the back and legs. Trouble walking. Your symptoms will depend on the cause of the fracture and how quickly it develops. How is this diagnosed? This condition may be diagnosed based on symptoms, medical history, and a physical exam. During the physical exam, your health care provider may tap along the length of your spine to check for  tenderness. Tests may be done to confirm the diagnosis. They may include: A bone mineral density test to check for osteoporosis. Imaging tests, such as a spine X-ray, CT scan, or MRI. How is this treated? Treatment depends on the cause and severity of the condition. Some fractures may heal on their own with supportive care. Treatment may include: Pain medicine. Rest. A back brace. Physical therapy exercises. Medicine to strengthen bone. Calcium and vitamin D supplements. Fractures that cause the back to become misshapen, cause nerve pain or weakness, or do not respond to other treatment may be treated with surgery. This may include: Vertebroplasty. Bone cement is injected into the collapsed vertebrae to stabilize them. Balloon kyphoplasty. The collapsed vertebrae are expanded with a balloon and then bone cement is injected into them. Spinal fusion. The collapsed vertebrae are connected (fused) to normal vertebrae. Follow these instructions at home: Medicines Take over-the-counter and prescription medicines only as told by your health care provider. Ask your health care provider if the medicine prescribed to you: Requires you to avoid driving or using machinery. Can cause constipation. You may need to take these actions to prevent or treat constipation: Drink enough fluid to keep your urine pale yellow. Take over-the-counter or prescription medicines. Eat foods that are high in fiber, such as beans, whole grains, and fresh fruits and vegetables. Limit foods that are high in fat and processed sugars, such as fried or sweet foods. If you have a brace: Wear the brace as told by your health care provider. Remove it only as told  by your health care provider. Loosen the brace if your fingers or toes tingle, become numb, or turn cold and blue. Keep the brace clean. If the brace is not waterproof: Do not let it get wet. Cover it with a watertight covering when you take a bath or a  shower. Managing pain, stiffness, and swelling  If directed, put ice on the injured area. To do this: If you have a removable brace, remove it as told by your health care provider. Put ice in a plastic bag. Place a towel between your skin and the bag. Leave the ice on for 20 minutes, 2-3 times a day. Remove the ice if your skin turns bright red. This is very important. If you cannot feel pain, heat, or cold, you have a greater risk of damage to the area. Activity Rest as told by your health care provider. Avoid sitting for a long time without moving. Get up to take short walks every 1-2 hours. This is important to improve blood flow and breathing. Ask for help if you feel weak or unsteady. Return to your normal activities as told by your health care provider. Ask what activities are safe for you. Do physical therapy exercises to improve movement and strength in your back, as recommended by your health care provider. Exercise regularly as directed by your health care provider. General instructions  Do not drink alcohol. Alcohol can interfere with your treatment. Do not use any products that contain nicotine or tobacco, such as cigarettes, e-cigarettes, and chewing tobacco. These can delay bone healing. If you need help quitting, ask your health care provider. Keep all follow-up visits. This is important. It can help to prevent permanent injury, disability, and long-lasting (chronic) pain. Contact a health care provider if: You have a fever. Your pain medicine is not helping. Your pain does not get better over time. You cannot return to your normal activities as planned or expected. Get help right away if: Your pain is very bad and it suddenly gets worse. You are unable to move any body part (paralysis) that is below the level of your injury. You have numbness, tingling, or weakness in any body part that is below the level of your injury. You cannot control your bladder or  bowels. Summary A spinal compression fracture is a collapse of the bones that form the spine (vertebrae). With this type of fracture, the vertebrae become pushed (compressed) into a wedge shape. Your symptoms and treatment will depend on the cause and severity of the fracture and how quickly it develops. Some fractures may heal on their own with supportive care. Fractures that cause the back to become misshapen, cause nerve pain or weakness, or do not respond to other treatment may be treated with surgery. This information is not intended to replace advice given to you by your health care provider. Make sure you discuss any questions you have with your health care provider. Document Revised: 11/10/2019 Document Reviewed: 11/10/2019 Elsevier Patient Education  [employer].

## 2021-08-07 NOTE — Progress Notes (Signed)
Established Patient Office Visit  Subjective:  Patient ID: Anna Flores, female    DOB: [birth year]  Age: 31 y.o. MRN: [id number]  CC:  Chief Complaint  Patient presents with   Hospitalization Follow-up    Back pain continues has not got better.    HPI Anna Flores presents for hospital follow up. Husband was replacing her brace pads on her truck and was underneath the truck when the back jack slipped and the patient tried to lift the front part of the truck and injured her back in doing so. This happened on 07/22/21 and she has her tubal ligation on 07/24/21.   Discharge Date: 07/26/21 Hospital/facility: [facility] Diagnosis: T12 compression fracture Procedures/tests: Blood work normal, large amount of RBCs on UA CT A/P: no intra-abdominal or intrapelvic abnormaltitiy, interval development of a superior endplate T12 vertebral body trace compression fracture. CT Lumbar: Negative Consultants: None New medications: Percocet, Robaxin, Mobic Discontinued medications: None Status: fluctuating  BACK PAIN Duration: 2 weeks Mechanism of injury: lifting Location: midline and low back Onset: sudden Severity: moderate Quality: sharp Frequency: intermittent Radiation: R leg above the knee Aggravating factors: laying, prolonged standing, lifting  Alleviating factors: rest Status: fluctuating Treatments attempted: rest, APAP, and ibuprofen  Relief with NSAIDs?: mild Nighttime pain:  yes Paresthesias / decreased sensation:  no Bowel / bladder incontinence:  no Fevers:  no Dysuria / urinary frequency:  no  Past Medical History:  Diagnosis Date   Abdominal pain 10/26/2013   Anemia 2013   Anxiety    Back pain affecting pregnancy in third trimester 02/28/2020   Bipolar disorder (HCC)    Depression    History of Papanicolaou smear of cervix 10/02/2016   NEG   IDA (iron deficiency anemia) 02/29/2020   Indication for care in labor and delivery, antepartum 03/07/2020   Mastitis 01/19/2021    right   Menometrorrhagia 10/26/2013   PONV (postoperative nausea and vomiting)    Postpartum galactocele 02/01/2021   Pregnancy    DELIVERED 08/12/16 - JEG    Past Surgical History:  Procedure Laterality Date   DIAGNOSTIC LAPAROSCOPY  2018   suspected ectopic pregnancy-[facility]   DILATION AND EVACUATION N/A 06/11/2021   Procedure: D & C;  Surgeon: Conard NovakJackson, Stephen D, MD;  Location: [facility] ORS;  Service: Gynecology;  Laterality: N/A;   INCISION AND DRAINAGE ABSCESS Right 02/06/2021   Procedure: INCISION AND DRAINAGE ABSCESS;  Surgeon: Leafy RoPabon, Diego F, MD;  Location: [facility] ORS;  Service: General;  Laterality: Right;   LAPAROSCOPY N/A 06/11/2021   Procedure: LAPAROSCOPY DIAGNOSTIC;  Surgeon: Conard NovakJackson, Stephen D, MD;  Location: [facility] ORS;  Service: Gynecology;  Laterality: N/A;   TONSILLECTOMY     XI ROBOTIC ASSISTED SALPINGECTOMY Bilateral 07/24/2021   Procedure: XI ROBOTIC ASSISTED SALPINGECTOMY;  Surgeon: Natale MilchSchuman, Christanna R, MD;  Location: [facility] ORS;  Service: Gynecology;  Laterality: Bilateral;    Family History  Adopted: Yes  Problem Relation Age of Onset   Nevi Sister        DISPLASTIC NEVUS   Ovarian cancer Mother    Cirrhosis Father     Social History   Socioeconomic History   Marital status: Married    Spouse name: [name]    Number of children: 5   Years of education: 3   Highest education level: Not on file  Occupational History   Occupation: HOMEMAKER  Tobacco Use   Smoking status: Never   Smokeless tobacco: Never  Vaping Use   Vaping Use: Never used  Substance and Sexual  Activity   Alcohol use: Yes    Comment: occassional   Drug use: No   Sexual activity: Yes    Birth control/protection: Surgical    Comment: Vasectomy  Other Topics Concern   Not on file  Social History Narrative   Not on file   Social Determinants of Health   Financial Resource Strain: Not on file  Food Insecurity: Not on file  Transportation Needs: Not on file  Physical Activity: Not on  file  Stress: Not on file  Social Connections: Not on file  Intimate Partner Violence: Not on file    Outpatient Medications Prior to Visit  Medication Sig Dispense Refill   ALPRAZolam (XANAX) 0.5 MG tablet Take 0.25 mg by mouth 2 (two) times daily as needed for sleep or anxiety.     ARIPiprazole (ABILIFY) 10 MG tablet Take 10 mg by mouth at bedtime. Take 5 mg for 14 days then increase to 10 mg daily     ibuprofen (ADVIL) 600 MG tablet Take 1 tablet (600 mg total) by mouth every 6 (six) hours as needed. 60 tablet 0   lamoTRIgine (LAMICTAL) 200 MG tablet Take 200 mg by mouth at bedtime.     meloxicam (MOBIC) 15 MG tablet Take 1 tablet (15 mg total) by mouth daily. 30 tablet 0   methocarbamol (ROBAXIN) 500 MG tablet Take 1 tablet (500 mg total) by mouth 4 (four) times daily. 30 tablet 0   ondansetron (ZOFRAN-ODT) 4 MG disintegrating tablet Take 1 tablet (4 mg total) by mouth every 8 (eight) hours as needed for nausea. 30 tablet 0   oxyCODONE (ROXICODONE) 5 MG immediate release tablet Take 1 tablet (5 mg total) by mouth every 8 (eight) hours as needed. 20 tablet 0   oxyCODONE-acetaminophen (PERCOCET/ROXICET) 5-325 MG tablet Take 1 tablet by mouth every 6 (six) hours as needed for severe pain. 20 tablet 0   QUEtiapine (SEROQUEL) 50 MG tablet Take 25 mg by mouth at bedtime as needed (sleep).     No facility-administered medications prior to visit.    Allergies  Allergen Reactions   Amoxicillin Hives   Hydrocodone Shortness Of Breath and Other (See Comments)    "loss of consciousness"; "felt really weird" when she was given 10 mgm hydrocodone. Did well with 5 mgm tablet   Vancomycin Itching    C/O head burning and itching   Reglan [Metoclopramide] Other (See Comments)   Cherry Hives    ROS Review of Systems  Constitutional:  Negative for chills and fever.  Musculoskeletal:  Positive for back pain. Negative for gait problem.  Skin: Negative.   Neurological:  Positive for weakness and  numbness.     Objective:    Physical Exam Constitutional:      Appearance: Normal appearance.  HENT:     Head: Normocephalic and atraumatic.  Eyes:     Conjunctiva/sclera: Conjunctivae normal.  Cardiovascular:     Rate and Rhythm: Normal rate and regular rhythm.  Pulmonary:     Effort: Pulmonary effort is normal.     Breath sounds: Normal breath sounds.  Musculoskeletal:        General: Tenderness present.     Comments: Pain over T12, midline, worse with flexion  Skin:    General: Skin is warm and dry.  Neurological:     General: No focal deficit present.     Mental Status: She is alert. Mental status is at baseline.  Psychiatric:        Mood and Affect:  Mood normal.        Behavior: Behavior normal.    BP 104/66    Pulse 76    Temp 98.3 F (36.8 C) (Oral)    Resp 16    Ht 5\' 5"  (1.651 m)    Wt 215 lb 6.4 oz (97.7 kg)    LMP 07/08/2021 (Exact Date)    SpO2 99%    BMI 35.84 kg/m  Wt Readings from Last 3 Encounters:  08/07/21 215 lb 6.4 oz (97.7 kg)  07/26/21 212 lb (96.2 kg)  07/19/21 213 lb 3.2 oz (96.7 kg)     Health Maintenance Due  Topic Date Due   Hepatitis C Screening  Never done    There are no preventive care reminders to display for this patient.  Lab Results  Component Value Date   TSH 1.412 02/04/2019   Lab Results  Component Value Date   WBC 8.5 07/26/2021   HGB 13.0 07/26/2021   HCT 40.5 07/26/2021   MCV 88.0 07/26/2021   PLT 289 07/26/2021   Lab Results  Component Value Date   NA 137 07/26/2021   K 3.8 07/26/2021   CO2 28 07/26/2021   GLUCOSE 93 07/26/2021   BUN 13 07/26/2021   CREATININE 0.77 07/26/2021   BILITOT 0.8 07/26/2021   ALKPHOS 56 07/26/2021   AST 15 07/26/2021   ALT 15 07/26/2021   PROT 7.4 07/26/2021   ALBUMIN 4.4 07/26/2021   CALCIUM 9.2 07/26/2021   ANIONGAP 7 07/26/2021   No results found for: CHOL No results found for: HDL No results found for: LDLCALC No results found for: TRIG No results found for:  CHOLHDL Lab Results  Component Value Date   HGBA1C 5.2 05/07/2019      Assessment & Plan:   1. Compression fracture of T12 vertebra with routine healing, subsequent encounter: Pain medication causing side effects without pain relief, will discontinue. Can continue anti-inflammatory, lidocaine patch and different muscle relaxer. Rest and avoid lifting anything >10 pounds for next 8-12 weeks. Can discuss physical therapy if pain still consistent at follow up.  - Lidocaine (HM LIDOCAINE PATCH) 4 % PTCH; Apply 1 each topically daily.  Dispense: 30 patch; Refill: 1 - tiZANidine (ZANAFLEX) 4 MG tablet; Take 1 tablet (4 mg total) by mouth every 6 (six) hours as needed for muscle spasms.  Dispense: 30 tablet; Refill: 0  Follow-up: Return for already scheduled.    07/07/2019, DO

## 2021-08-09 ENCOUNTER — Encounter: Payer: Medicaid Other | Admitting: Obstetrics and Gynecology

## 2021-08-10 ENCOUNTER — Encounter: Payer: Self-pay | Admitting: Obstetrics and Gynecology

## 2021-08-10 ENCOUNTER — Ambulatory Visit (INDEPENDENT_AMBULATORY_CARE_PROVIDER_SITE_OTHER): Payer: Medicaid Other | Admitting: Obstetrics and Gynecology

## 2021-08-10 ENCOUNTER — Other Ambulatory Visit: Payer: Self-pay

## 2021-08-10 VITALS — BP 120/70 | Ht 65.0 in | Wt 213.6 lb

## 2021-08-10 DIAGNOSIS — Z4889 Encounter for other specified surgical aftercare: Secondary | ICD-10-CM

## 2021-08-10 DIAGNOSIS — S22080S Wedge compression fracture of T11-T12 vertebra, sequela: Secondary | ICD-10-CM

## 2021-08-10 NOTE — Progress Notes (Signed)
°  Postoperative Follow-up Patient presents post op from  [facility] Robot-assisted Laparoscopic bilateral salpingectomy   for  sterilization , 2 weeks ago.  Subjective: She reports that since the surgery she has had back pain related to a compression fracture which occurred prior to the surgery.  She has been following up with her PCP regarding this.  She has been giving muscle relaxers and pain medicines which makes her feel sleepy.  She has lifting restrictions and plans to follow-up with them in 12 weeks. Patient reports some improvement in her preop symptoms. Eating a regular diet without difficulty.       Activity: limited from unrelated back injury.   Objective: BP 120/70    Ht 5\' 5"  (1.651 m)    Wt 213 lb 9.6 oz (96.9 kg)    LMP 08/08/2021    BMI 35.54 kg/m  Physical Exam Constitutional:      Appearance: Normal appearance. She is well-developed.  HENT:     Head: Normocephalic and atraumatic.  Eyes:     Extraocular Movements: Extraocular movements intact.     Pupils: Pupils are equal, round, and reactive to light.  Neck:     Thyroid: No thyromegaly.  Pulmonary:     Effort: Pulmonary effort is normal.  Abdominal:     General: Bowel sounds are normal. There is no distension.     Palpations: Abdomen is soft. There is no mass.     Comments: Incision are well healed, clean, dry, and intact.   Musculoskeletal:     Cervical back: Neck supple.  Neurological:     Mental Status: She is alert and oriented to person, place, and time.  Skin:    General: Skin is warm and dry.  Psychiatric:        Behavior: Behavior normal.        Thought Content: Thought content normal.        Judgment: Judgment normal.  Vitals reviewed.    Assessment: s/p :  Xi Robot-assisted Laparoscopic bilateral salpingectomy  stable  Plan:  Patient has done well after surgery with no apparent complications.    I have discussed the post-operative course to date, and the expected progress moving forward.  The  patient understands what complications to be concerned about.  I will see the patient in routine follow up, or sooner if needed.    Activity plan: return to normal activity as tolerated  Discussed [facility] as resource for back injury.  Adrian Prows MD, [facility], [facility] 08/10/2021 12:04 PM

## 2021-08-21 NOTE — Telephone Encounter (Signed)
Copied from CRM [id number]. Topic: General - Inquiry >> Aug 21, 2021 12:18 PM Crist Infante wrote: Reason for CRM: pt needs a return to work note.  Dr Caralee Ates took her out of work, but then return w/ resctrictions. But her employer will not let her return at all unless it is w/ no resctrictions.  Pt wants a return to work note that says "No restrictions" Pt states her family is in a situation and she needs to go back to work ASAP.  Her husband is out of work and they have NO income.

## 2021-08-31 NOTE — Telephone Encounter (Signed)
Copied from CRM [id number]. Topic: General - Other >> Aug 31, 2021  1:49 PM Wyonia Hough E wrote: Reason for CRM: Pt received a work note writing her out for 12 weeks / but her husband has since lost her job and she needs to return to work asap and needs a letter from Dr. Caralee Ates to do so / please advise

## 2021-09-26 ENCOUNTER — Encounter: Payer: Self-pay | Admitting: Oncology

## 2021-09-26 ENCOUNTER — Encounter: Payer: Self-pay | Admitting: Internal Medicine

## 2021-09-26 ENCOUNTER — Telehealth: Payer: Self-pay

## 2021-09-26 ENCOUNTER — Ambulatory Visit: Payer: Medicaid Other | Admitting: Internal Medicine

## 2021-09-26 VITALS — BP 108/64 | HR 98 | Temp 98.4°F | Resp 16 | Ht 65.0 in | Wt 215.3 lb

## 2021-09-26 DIAGNOSIS — F319 Bipolar disorder, unspecified: Secondary | ICD-10-CM

## 2021-09-26 DIAGNOSIS — Z1159 Encounter for screening for other viral diseases: Secondary | ICD-10-CM | POA: Diagnosis not present

## 2021-09-26 DIAGNOSIS — S22080D Wedge compression fracture of T11-T12 vertebra, subsequent encounter for fracture with routine healing: Secondary | ICD-10-CM | POA: Diagnosis not present

## 2021-09-26 DIAGNOSIS — D508 Other iron deficiency anemias: Secondary | ICD-10-CM | POA: Diagnosis not present

## 2021-09-26 DIAGNOSIS — Z13220 Encounter for screening for lipoid disorders: Secondary | ICD-10-CM | POA: Diagnosis not present

## 2021-09-26 DIAGNOSIS — F411 Generalized anxiety disorder: Secondary | ICD-10-CM | POA: Diagnosis not present

## 2021-09-26 MED ORDER — TIZANIDINE HCL 4 MG PO TABS: 4.0000 mg | ORAL_TABLET | Freq: Four times a day (QID) | ORAL | 1 refills | Status: DC | PRN

## 2021-09-26 NOTE — Telephone Encounter (Signed)
Copied from CRM [id number]. Topic: General - Other >> Sep 26, 2021  2:33 PM Glean Salen wrote: Reason for CRM:Pt called in asking for cal lback from nurse to see when she is cleared to go back to work

## 2021-09-26 NOTE — Assessment & Plan Note (Signed)
Following with Psychiatry, seeing them in March.

## 2021-09-26 NOTE — Assessment & Plan Note (Signed)
Recheck CBC. 

## 2021-09-26 NOTE — Telephone Encounter (Signed)
Did she need to see ortho first?

## 2021-09-26 NOTE — Progress Notes (Signed)
Established Patient Office Visit  Subjective:  Patient ID: Anna Flores, female    DOB: August 29, 1990  Age: 31 y.o. MRN: [id number]  CC:  Chief Complaint  Patient presents with   Back Pain    HPI Anna Flores presents for follow up.   Compression of T12 vertebra: -Injury after attempting to lift truck bed on 07/12/21, was seen in ER at the time -CT A/P: no intra-abdominal or intrapelvic abnormaltitiy, interval development of a superior endplate T12 vertebral body trace compression fracture on 07/26/21 -Given Lidocaine patch and muscle relaxer at last visit, states these helped. She is also taking anti-inflammatories and Tylenol as needed.  -Still having mild pain on the right side of T12, slowly getting better but still not working -ROM slowly improving  Iron deficiency anemia - not currently on oral iron, couldn't tolerate. She had to have Venofer infusions during last pregnancy in 2021.    Bipolar/GAD: sees a therapist every week and follows with Psychiatrist. Currently on Seroquel 25 mg, Abilify 10, and Lamictal 150 mg daily.  She is seeing Psych in March. Xanax as needed and at bedtime.   Health Maintenance: -Blood work due  Past Medical History:  Diagnosis Date   Abdominal pain 10/26/2013   Anemia 2013   Anxiety    Back pain affecting pregnancy in third trimester 02/28/2020   Bipolar disorder (HCC)    Depression    History of Papanicolaou smear of cervix 10/02/2016   NEG   IDA (iron deficiency anemia) 02/29/2020   Indication for care in labor and delivery, antepartum 03/07/2020   Mastitis 01/19/2021   right   Menometrorrhagia 10/26/2013   PONV (postoperative nausea and vomiting)    Postpartum galactocele 02/01/2021   Pregnancy    DELIVERED 08/12/16 - JEG    Past Surgical History:  Procedure Laterality Date   DIAGNOSTIC LAPAROSCOPY  2018   suspected ectopic pregnancy-[facility]   DILATION AND EVACUATION N/A 06/11/2021   Procedure: D & C;  Surgeon: Conard Novak, MD;   Location: [facility] ORS;  Service: Gynecology;  Laterality: N/A;   INCISION AND DRAINAGE ABSCESS Right 02/06/2021   Procedure: INCISION AND DRAINAGE ABSCESS;  Surgeon: Leafy Ro, MD;  Location: [facility] ORS;  Service: General;  Laterality: Right;   LAPAROSCOPY N/A 06/11/2021   Procedure: LAPAROSCOPY DIAGNOSTIC;  Surgeon: Conard Novak, MD;  Location: [facility] ORS;  Service: Gynecology;  Laterality: N/A;   TONSILLECTOMY     XI ROBOTIC ASSISTED SALPINGECTOMY Bilateral 07/24/2021   Procedure: XI ROBOTIC ASSISTED SALPINGECTOMY;  Surgeon: [name], MD;  Location: [facility] ORS;  Service: Gynecology;  Laterality: Bilateral;    Family History  Adopted: Yes  Problem Relation Age of Onset   Nevi Sister        DISPLASTIC NEVUS   Ovarian cancer Mother    Cirrhosis Father     Social History   Socioeconomic History   Marital status: Married    Spouse name: [name]    Number of children: 5   Years of education: 3   Highest education level: Not on file  Occupational History   Occupation: HOMEMAKER  Tobacco Use   Smoking status: Never   Smokeless tobacco: Never  Vaping Use   Vaping Use: Never used  Substance and Sexual Activity   Alcohol use: Yes    Comment: occassional   Drug use: No   Sexual activity: Yes    Birth control/protection: Surgical    Comment: Vasectomy  Other Topics Concern   Not on  file  Social History Narrative   Not on file   Social Determinants of Health   Financial Resource Strain: Not on file  Food Insecurity: Not on file  Transportation Needs: Not on file  Physical Activity: Not on file  Stress: Not on file  Social Connections: Not on file  Intimate Partner Violence: Not on file    Outpatient Medications Prior to Visit  Medication Sig Dispense Refill   ALPRAZolam (XANAX) 0.5 MG tablet Take 0.25 mg by mouth 2 (two) times daily as needed for sleep or anxiety.     ARIPiprazole (ABILIFY) 10 MG tablet Take 10 mg by mouth at bedtime. Take 5 mg for 14 days  then increase to 10 mg daily     QUEtiapine (SEROQUEL) 50 MG tablet Take 25 mg by mouth at bedtime as needed (sleep).     tiZANidine (ZANAFLEX) 4 MG tablet Take 1 tablet (4 mg total) by mouth every 6 (six) hours as needed for muscle spasms. 30 tablet 0   No facility-administered medications prior to visit.    Allergies  Allergen Reactions   Amoxicillin Hives   Hydrocodone Shortness Of Breath and Other (See Comments)    "loss of consciousness"; "felt really weird" when she was given 10 mgm hydrocodone. Did well with 5 mgm tablet   Vancomycin Itching    C/O head burning and itching   Reglan [Metoclopramide] Other (See Comments)   Cherry Hives    ROS Review of Systems  Constitutional:  Negative for chills and fever.  Respiratory:  Negative for cough.   Cardiovascular:  Negative for chest pain.  Gastrointestinal:  Negative for abdominal pain.  Musculoskeletal:  Positive for back pain.  Skin: Negative.   Neurological:  Negative for weakness and numbness.     Objective:    Physical Exam Constitutional:      Appearance: Normal appearance.  HENT:     Head: Normocephalic and atraumatic.  Eyes:     Conjunctiva/sclera: Conjunctivae normal.  Cardiovascular:     Rate and Rhythm: Normal rate and regular rhythm.  Pulmonary:     Effort: Pulmonary effort is normal.     Breath sounds: Normal breath sounds.  Musculoskeletal:        General: Tenderness present.     Right lower leg: No edema.     Left lower leg: No edema.     Comments: Tenderness to palpation at the level of T12 on the right, painful ROM only with right rotation.   Skin:    General: Skin is warm and dry.  Neurological:     General: No focal deficit present.     Mental Status: She is alert. Mental status is at baseline.  Psychiatric:        Mood and Affect: Mood normal.        Behavior: Behavior normal.    BP 108/64    Pulse 98    Temp 98.4 F (36.9 C)    Resp 16    Ht 5\' 5"  (1.651 m)    Wt 215 lb 4.8 oz (97.7  kg)    SpO2 99%    BMI 35.83 kg/m  Wt Readings from Last 3 Encounters:  09/26/21 215 lb 4.8 oz (97.7 kg)  08/10/21 213 lb 9.6 oz (96.9 kg)  08/07/21 215 lb 6.4 oz (97.7 kg)     Health Maintenance Due  Topic Date Due   Hepatitis C Screening  Never done    There are no preventive care reminders to display  for this patient.  Lab Results  Component Value Date   TSH 1.412 02/04/2019   Lab Results  Component Value Date   WBC 8.5 07/26/2021   HGB 13.0 07/26/2021   HCT 40.5 07/26/2021   MCV 88.0 07/26/2021   PLT 289 07/26/2021   Lab Results  Component Value Date   NA 137 07/26/2021   K 3.8 07/26/2021   CO2 28 07/26/2021   GLUCOSE 93 07/26/2021   BUN 13 07/26/2021   CREATININE 0.77 07/26/2021   BILITOT 0.8 07/26/2021   ALKPHOS 56 07/26/2021   AST 15 07/26/2021   ALT 15 07/26/2021   PROT 7.4 07/26/2021   ALBUMIN 4.4 07/26/2021   CALCIUM 9.2 07/26/2021   ANIONGAP 7 07/26/2021   No results found for: CHOL No results found for: HDL No results found for: LDLCALC No results found for: TRIG No results found for: CHOLHDL Lab Results  Component Value Date   HGBA1C 5.2 05/07/2019      Assessment & Plan:   Problem List Items Addressed This Visit       Other   Absolute anemia   Relevant Orders   CBC w/Diff/Platelet   GAD (generalized anxiety disorder)    Following with Psychiatry, seeing them in March.      Other Visit Diagnoses     Compression fracture of T12 vertebra with routine healing, subsequent encounter    -  Primary - Referral placed to Ortho for work release. Referral for physical therapy to help with strength and ROM. Refilled muscle relaxer, continue Tylenol/anti-inflammatories as needed.    Relevant Medications   tiZANidine (ZANAFLEX) 4 MG tablet   Other Relevant Orders   Basic Metabolic Panel (BMET)   AMB referral to orthopedics   Ambulatory referral to Physical Therapy   Bipolar 1 disorder (HCC)   - Following with Psychiatry, moods stable.      Need for hepatitis C screening test       Relevant Orders   Hepatitis C Antibody   Lipid screening       Relevant Orders   Lipid Profile       Meds ordered this encounter  Medications   tiZANidine (ZANAFLEX) 4 MG tablet    Sig: Take 1 tablet (4 mg total) by mouth every 6 (six) hours as needed for muscle spasms.    Dispense:  30 tablet    Refill:  1    Follow-up: Return in about 6 months (around 03/26/2022).    Margarita Mail, DO

## 2021-09-26 NOTE — Patient Instructions (Addendum)
It was great seeing you today!  Plan discussed at today's visit: -Blood work ordered today, results will be uploaded to [facility] [place] [facility].  -Referral placed to Ortho and PT, we will call you to set these up for you -Muscle relaxer refilled, continue Tylenol and Ibuprofen as needed   Follow up in: 6 months   Take care and let us know if you have any questions or concerns prior to your next visit.  Dr. Rosana Berger

## 2021-09-27 LAB — CBC WITH DIFFERENTIAL/PLATELET
Absolute Monocytes: 601 cells/uL (ref 200–950)
Basophils Absolute: 23 cells/uL (ref 0–200)
Basophils Relative: 0.3 %
Eosinophils Absolute: 54 cells/uL (ref 15–500)
Eosinophils Relative: 0.7 %
HCT: 40.5 % (ref 35.0–45.0)
Hemoglobin: 13.6 g/dL (ref 11.7–15.5)
Lymphs Abs: 1979 cells/uL (ref 850–3900)
MCH: 28.8 pg (ref 27.0–33.0)
MCHC: 33.6 g/dL (ref 32.0–36.0)
MCV: 85.8 fL (ref 80.0–100.0)
MPV: 9.9 fL (ref 7.5–12.5)
Monocytes Relative: 7.8 %
Neutro Abs: 5044 cells/uL (ref 1500–7800)
Neutrophils Relative %: 65.5 %
Platelets: 331 10*3/uL (ref 140–400)
RBC: 4.72 10*6/uL (ref 3.80–5.10)
RDW: 12.6 % (ref 11.0–15.0)
Total Lymphocyte: 25.7 %
WBC: 7.7 10*3/uL (ref 3.8–10.8)

## 2021-09-27 LAB — LIPID PANEL
Cholesterol: 153 mg/dL (ref <200)
HDL: 35 mg/dL — ABNORMAL LOW (ref >=50)
LDL Cholesterol (Calc): 87 mg/dL (calc)
Non-HDL Cholesterol (Calc): 118 mg/dL (calc) (ref <130)
Total CHOL/HDL Ratio: 4.4 (calc) (ref <5.0)
Triglycerides: 224 mg/dL — ABNORMAL HIGH (ref <150)

## 2021-09-27 LAB — BASIC METABOLIC PANEL
BUN: 9 mg/dL (ref 7–25)
CO2: 31 mmol/L (ref 20–32)
Calcium: 9.8 mg/dL (ref 8.6–10.2)
Chloride: 101 mmol/L (ref 98–110)
Creat: 0.84 mg/dL (ref 0.50–0.96)
Glucose, Bld: 88 mg/dL (ref 65–99)
Potassium: 4.2 mmol/L (ref 3.5–5.3)
Sodium: 137 mmol/L (ref 135–146)

## 2021-09-27 LAB — HEPATITIS C ANTIBODY
Hepatitis C Ab: NONREACTIVE
SIGNAL TO CUT-OFF: <0.02 (ref <1.00)

## 2021-10-02 ENCOUNTER — Ambulatory Visit: Payer: Medicaid Other | Admitting: Internal Medicine

## 2021-10-03 ENCOUNTER — Encounter: Payer: Self-pay | Admitting: Oncology

## 2021-10-03 DIAGNOSIS — F4312 Post-traumatic stress disorder, chronic: Secondary | ICD-10-CM | POA: Diagnosis not present

## 2021-10-03 DIAGNOSIS — F322 Major depressive disorder, single episode, severe without psychotic features: Secondary | ICD-10-CM | POA: Diagnosis not present

## 2021-10-03 DIAGNOSIS — F411 Generalized anxiety disorder: Secondary | ICD-10-CM | POA: Diagnosis not present

## 2021-10-03 DIAGNOSIS — F3112 Bipolar disorder, current episode manic without psychotic features, moderate: Secondary | ICD-10-CM | POA: Diagnosis not present

## 2021-10-04 ENCOUNTER — Encounter: Payer: Self-pay | Admitting: Surgery

## 2021-10-04 ENCOUNTER — Telehealth: Payer: Self-pay

## 2021-10-04 ENCOUNTER — Ambulatory Visit (INDEPENDENT_AMBULATORY_CARE_PROVIDER_SITE_OTHER): Payer: Medicaid Other | Admitting: Surgery

## 2021-10-04 ENCOUNTER — Other Ambulatory Visit: Payer: Self-pay

## 2021-10-04 VITALS — BP 110/75 | HR 82 | Ht 65.0 in | Wt 215.3 lb

## 2021-10-04 DIAGNOSIS — S22080A Wedge compression fracture of T11-T12 vertebra, initial encounter for closed fracture: Secondary | ICD-10-CM

## 2021-10-04 NOTE — Telephone Encounter (Signed)
Received a message from the office of Zonia Kief stating that patient is coming by our office today to get note to return to work.

## 2021-10-04 NOTE — Telephone Encounter (Signed)
Please review ortho note, they did nothing for patient and states you will need to clear, he didn't know why she was out. ?

## 2021-10-04 NOTE — Progress Notes (Signed)
31 year old white female is being seen at the request of Dr. Teodora Medici with [facility] for history of T12 compression fracture.  Date of injury mid December 2022 when she was helping her husband change the brakes on their vehicle.  Seen in the emergency department July 26, 2021 and diagnosed with a compression fracture at T12.  Seen by PCP Dr. Rosana Berger August 07, 2021 and then again September 26, 2021 and no referral made immediately after injury.  PCP has been conservatively managing this.  I reviewed their notes.  Patient states that Dr. Rosana Berger took her out of work for 12 weeks.  For some reason that is unclear to me Dr. Rosana Berger referred patient to our office for clearance to go back to work.  Patient states that she told PCP that she was not having any other issues and was requesting a note from them. ? ?Plan ?I advised patient that since Dr. Rosana Berger was the provider that has been treating her over the last couple of months and has previously taken out of work for 12 weeks that she should be the one continuing to managing this and released the patient if she is not having any issues.  Patient stated that she will go directly to their office for a note.  I also asked my assistant to contact Dr. Rosana Berger office today regarding this.  Patient no charge for today's visit. ?

## 2021-10-05 ENCOUNTER — Encounter: Payer: Self-pay | Admitting: Internal Medicine

## 2021-10-05 ENCOUNTER — Telehealth: Payer: Self-pay

## 2021-10-05 NOTE — Telephone Encounter (Signed)
Not a patient at [facility] 

## 2021-10-05 NOTE — Telephone Encounter (Signed)
Copied from CRM [id number]. Topic: General - Other ?>> Oct 04, 2021  3:21 PM [name], [place] C wrote: ?Reason for CRM: pt called in for assistance. Pt says that her PCP wrote her out of work. Pt says that she was sent to see Ortho. Pt says that she is currently there visiting ortho and was told that he is unable to release her to return to work. Pt says that she was told to reach out to her PCP and request a return to work note.  ? ?Pt says that she was has been seen by her PCP and would like a return to work note sent to her in her mychart.  ? ?Please assist pt further. ?

## 2021-10-09 DIAGNOSIS — F4312 Post-traumatic stress disorder, chronic: Secondary | ICD-10-CM | POA: Diagnosis not present

## 2021-10-09 DIAGNOSIS — F3112 Bipolar disorder, current episode manic without psychotic features, moderate: Secondary | ICD-10-CM | POA: Diagnosis not present

## 2021-10-09 DIAGNOSIS — F411 Generalized anxiety disorder: Secondary | ICD-10-CM | POA: Diagnosis not present

## 2021-10-10 DIAGNOSIS — F333 Major depressive disorder, recurrent, severe with psychotic symptoms: Secondary | ICD-10-CM | POA: Diagnosis not present

## 2021-10-11 DIAGNOSIS — F333 Major depressive disorder, recurrent, severe with psychotic symptoms: Secondary | ICD-10-CM | POA: Diagnosis not present

## 2021-10-11 DIAGNOSIS — R441 Visual hallucinations: Secondary | ICD-10-CM | POA: Diagnosis not present

## 2021-10-11 DIAGNOSIS — R45851 Suicidal ideations: Secondary | ICD-10-CM | POA: Diagnosis not present

## 2021-10-11 DIAGNOSIS — F319 Bipolar disorder, unspecified: Secondary | ICD-10-CM | POA: Diagnosis not present

## 2021-10-11 DIAGNOSIS — F419 Anxiety disorder, unspecified: Secondary | ICD-10-CM | POA: Diagnosis not present

## 2021-10-11 DIAGNOSIS — F4310 Post-traumatic stress disorder, unspecified: Secondary | ICD-10-CM | POA: Diagnosis not present

## 2021-10-11 DIAGNOSIS — F3130 Bipolar disorder, current episode depressed, mild or moderate severity, unspecified: Secondary | ICD-10-CM | POA: Diagnosis not present

## 2021-10-12 DIAGNOSIS — M549 Dorsalgia, unspecified: Secondary | ICD-10-CM | POA: Diagnosis not present

## 2021-10-12 DIAGNOSIS — F332 Major depressive disorder, recurrent severe without psychotic features: Secondary | ICD-10-CM | POA: Diagnosis not present

## 2021-10-12 DIAGNOSIS — F319 Bipolar disorder, unspecified: Secondary | ICD-10-CM | POA: Diagnosis not present

## 2021-10-12 DIAGNOSIS — R45851 Suicidal ideations: Secondary | ICD-10-CM | POA: Diagnosis not present

## 2021-10-12 DIAGNOSIS — F411 Generalized anxiety disorder: Secondary | ICD-10-CM | POA: Diagnosis not present

## 2021-10-12 DIAGNOSIS — F333 Major depressive disorder, recurrent, severe with psychotic symptoms: Secondary | ICD-10-CM | POA: Diagnosis not present

## 2021-10-15 DIAGNOSIS — F319 Bipolar disorder, unspecified: Secondary | ICD-10-CM | POA: Diagnosis not present

## 2021-10-15 DIAGNOSIS — F4310 Post-traumatic stress disorder, unspecified: Secondary | ICD-10-CM | POA: Diagnosis not present

## 2021-10-15 DIAGNOSIS — R45851 Suicidal ideations: Secondary | ICD-10-CM | POA: Diagnosis not present

## 2021-10-15 DIAGNOSIS — F333 Major depressive disorder, recurrent, severe with psychotic symptoms: Secondary | ICD-10-CM | POA: Diagnosis not present

## 2021-10-15 DIAGNOSIS — F419 Anxiety disorder, unspecified: Secondary | ICD-10-CM | POA: Diagnosis not present

## 2021-10-15 DIAGNOSIS — R441 Visual hallucinations: Secondary | ICD-10-CM | POA: Diagnosis not present

## 2021-10-16 DIAGNOSIS — F4310 Post-traumatic stress disorder, unspecified: Secondary | ICD-10-CM | POA: Diagnosis not present

## 2021-10-16 DIAGNOSIS — F333 Major depressive disorder, recurrent, severe with psychotic symptoms: Secondary | ICD-10-CM | POA: Diagnosis not present

## 2021-10-16 DIAGNOSIS — F419 Anxiety disorder, unspecified: Secondary | ICD-10-CM | POA: Diagnosis not present

## 2021-10-16 DIAGNOSIS — R45851 Suicidal ideations: Secondary | ICD-10-CM | POA: Diagnosis not present

## 2021-10-16 DIAGNOSIS — R441 Visual hallucinations: Secondary | ICD-10-CM | POA: Diagnosis not present

## 2021-10-16 DIAGNOSIS — F319 Bipolar disorder, unspecified: Secondary | ICD-10-CM | POA: Diagnosis not present

## 2021-10-17 DIAGNOSIS — R441 Visual hallucinations: Secondary | ICD-10-CM | POA: Diagnosis not present

## 2021-10-17 DIAGNOSIS — F4310 Post-traumatic stress disorder, unspecified: Secondary | ICD-10-CM | POA: Diagnosis not present

## 2021-10-17 DIAGNOSIS — F419 Anxiety disorder, unspecified: Secondary | ICD-10-CM | POA: Diagnosis not present

## 2021-10-17 DIAGNOSIS — F319 Bipolar disorder, unspecified: Secondary | ICD-10-CM | POA: Diagnosis not present

## 2021-10-17 DIAGNOSIS — R45851 Suicidal ideations: Secondary | ICD-10-CM | POA: Diagnosis not present

## 2021-10-17 DIAGNOSIS — F333 Major depressive disorder, recurrent, severe with psychotic symptoms: Secondary | ICD-10-CM | POA: Diagnosis not present

## 2021-10-23 DIAGNOSIS — F4312 Post-traumatic stress disorder, chronic: Secondary | ICD-10-CM | POA: Diagnosis not present

## 2021-10-23 DIAGNOSIS — F411 Generalized anxiety disorder: Secondary | ICD-10-CM | POA: Diagnosis not present

## 2021-10-23 DIAGNOSIS — F3112 Bipolar disorder, current episode manic without psychotic features, moderate: Secondary | ICD-10-CM | POA: Diagnosis not present

## 2021-10-26 DIAGNOSIS — F53 Postpartum depression: Secondary | ICD-10-CM | POA: Diagnosis not present

## 2021-10-31 DIAGNOSIS — F333 Major depressive disorder, recurrent, severe with psychotic symptoms: Secondary | ICD-10-CM | POA: Diagnosis not present

## 2021-11-01 DIAGNOSIS — F4312 Post-traumatic stress disorder, chronic: Secondary | ICD-10-CM | POA: Diagnosis not present

## 2021-11-01 DIAGNOSIS — F411 Generalized anxiety disorder: Secondary | ICD-10-CM | POA: Diagnosis not present

## 2021-11-01 DIAGNOSIS — F3112 Bipolar disorder, current episode manic without psychotic features, moderate: Secondary | ICD-10-CM | POA: Diagnosis not present

## 2021-11-08 DIAGNOSIS — F4312 Post-traumatic stress disorder, chronic: Secondary | ICD-10-CM | POA: Diagnosis not present

## 2021-11-08 DIAGNOSIS — F3112 Bipolar disorder, current episode manic without psychotic features, moderate: Secondary | ICD-10-CM | POA: Diagnosis not present

## 2021-11-08 DIAGNOSIS — F411 Generalized anxiety disorder: Secondary | ICD-10-CM | POA: Diagnosis not present

## 2021-11-19 DIAGNOSIS — F411 Generalized anxiety disorder: Secondary | ICD-10-CM | POA: Diagnosis not present

## 2021-11-19 DIAGNOSIS — F3112 Bipolar disorder, current episode manic without psychotic features, moderate: Secondary | ICD-10-CM | POA: Diagnosis not present

## 2021-11-26 ENCOUNTER — Ambulatory Visit: Payer: Self-pay

## 2021-11-26 DIAGNOSIS — H1012 Acute atopic conjunctivitis, left eye: Secondary | ICD-10-CM | POA: Diagnosis not present

## 2021-11-26 NOTE — Telephone Encounter (Signed)
? ?  Chief Complaint: Left pain and swelling ?Symptoms: Above ?Frequency: Friday ?Pertinent Negatives: Patient denies drainage ?Disposition: [] ED /[x] Urgent Care (no appt availability in office) / [] Appointment(In office/virtual)/ []  [facility]/ [] Home Care/ [] Refused Recommended Disposition /[]  Mobile Bus/ []  Follow-up with PCP ?Additional Notes:   ? ?Reason for Disposition ? [1] SEVERE eyelid swelling on one side AND [2] red and painful (or tender to touch) ? ?Answer Assessment - Initial Assessment Questions ?1. ONSET: "When did the swelling start?" (e.g., minutes, hours, days) ?    Friday ?2. LOCATION: "What part of the eyelids is swollen?" ?    Left ?3. SEVERITY: "How swollen is it?" ?    Moderate ?4. ITCHING: "Is there any itching?" If Yes, ask: "How much?"   (Scale 1-10; mild, moderate or severe) ?    No ?5. PAIN: "Is the swelling painful to touch?" If Yes, ask: "How painful is it?"   (Scale 1-10; mild, moderate or severe) ?    Moderate ?6. FEVER: "Do you have a fever?" If Yes, ask: "What is it, how was it measured, and when did it start?"  ?    No ?7. CAUSE: "What do you think is causing the swelling?" ?    Unsure ?8. RECURRENT SYMPTOM: "Have you had eyelid swelling before?" If Yes, ask: "When was the last time?" "What happened that time?" ?    No ?9. OTHER SYMPTOMS: "Do you have any other symptoms?" (e.g., blurred vision, eye discharge, rash, runny nose) ?    No ?10. PREGNANCY: "Is there any chance you are pregnant?" "When was your last menstrual period?" ?      No ? ?Protocols used: Eye - Swelling-A-AH ? ?

## 2021-11-27 ENCOUNTER — Encounter: Payer: Self-pay | Admitting: Oncology
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
## 2021-11-28 ENCOUNTER — Encounter: Payer: Self-pay | Admitting: Physician Assistant

## 2021-11-28 ENCOUNTER — Ambulatory Visit: Payer: Medicaid Other | Admitting: Physician Assistant

## 2021-11-28 VITALS — BP 120/70 | HR 88 | Resp 16 | Ht 65.0 in | Wt 218.0 lb

## 2021-11-28 DIAGNOSIS — H00014 Hordeolum externum left upper eyelid: Secondary | ICD-10-CM

## 2021-11-28 NOTE — Patient Instructions (Signed)
I think your symptoms are likely from a hordeolum (stye)  ?I have attached some information for you to review to help with resolution ?You can use a lubricating eye drop (blink eye drops ) to help with eye irritation and a lid scrub  ? ?If it is not improving in about 5-7 days please let us know so we can schedule a visit to re-evaluate.  ? ?

## 2021-12-05 DIAGNOSIS — F419 Anxiety disorder, unspecified: Secondary | ICD-10-CM | POA: Diagnosis not present

## 2021-12-05 DIAGNOSIS — H00014 Hordeolum externum left upper eyelid: Secondary | ICD-10-CM | POA: Diagnosis not present

## 2021-12-05 DIAGNOSIS — H00011 Hordeolum externum right upper eyelid: Secondary | ICD-10-CM | POA: Diagnosis not present

## 2021-12-05 DIAGNOSIS — H00015 Hordeolum externum left lower eyelid: Secondary | ICD-10-CM | POA: Diagnosis not present

## 2021-12-05 DIAGNOSIS — F319 Bipolar disorder, unspecified: Secondary | ICD-10-CM | POA: Diagnosis not present

## 2021-12-10 DIAGNOSIS — F411 Generalized anxiety disorder: Secondary | ICD-10-CM | POA: Diagnosis not present

## 2021-12-10 DIAGNOSIS — F3112 Bipolar disorder, current episode manic without psychotic features, moderate: Secondary | ICD-10-CM | POA: Diagnosis not present

## 2022-01-29 ENCOUNTER — Other Ambulatory Visit: Payer: Self-pay

## 2022-01-29 ENCOUNTER — Emergency Department: Payer: Medicaid Other

## 2022-01-29 ENCOUNTER — Emergency Department
Admission: EM | Admit: 2022-01-29 | Discharge: 2022-01-29 | Disposition: A | Discharge status: Home / Self Care | Payer: Medicaid Other | Attending: Emergency Medicine | Admitting: Emergency Medicine

## 2022-01-29 DIAGNOSIS — Y9339 Activity, other involving climbing, rappelling and jumping off: Secondary | ICD-10-CM | POA: Insufficient documentation

## 2022-01-29 DIAGNOSIS — X509XXA Other and unspecified overexertion or strenuous movements or postures, initial encounter: Secondary | ICD-10-CM | POA: Insufficient documentation

## 2022-01-29 DIAGNOSIS — S93402A Sprain of unspecified ligament of left ankle, initial encounter: Secondary | ICD-10-CM | POA: Diagnosis not present

## 2022-01-29 DIAGNOSIS — S99912A Unspecified injury of left ankle, initial encounter: Secondary | ICD-10-CM | POA: Diagnosis present

## 2022-01-29 DIAGNOSIS — M25572 Pain in left ankle and joints of left foot: Secondary | ICD-10-CM | POA: Diagnosis not present
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
## 2022-02-11 DIAGNOSIS — F411 Generalized anxiety disorder: Secondary | ICD-10-CM | POA: Diagnosis not present

## 2022-02-11 DIAGNOSIS — F3112 Bipolar disorder, current episode manic without psychotic features, moderate: Secondary | ICD-10-CM | POA: Diagnosis not present

## 2022-02-27 DIAGNOSIS — F419 Anxiety disorder, unspecified: Secondary | ICD-10-CM | POA: Diagnosis not present

## 2022-02-27 DIAGNOSIS — M79672 Pain in left foot: Secondary | ICD-10-CM | POA: Diagnosis not present

## 2022-03-18 DIAGNOSIS — F411 Generalized anxiety disorder: Secondary | ICD-10-CM | POA: Diagnosis not present

## 2022-03-18 DIAGNOSIS — F3112 Bipolar disorder, current episode manic without psychotic features, moderate: Secondary | ICD-10-CM | POA: Diagnosis not present

## 2022-03-24 ENCOUNTER — Other Ambulatory Visit: Payer: Self-pay | Admitting: Internal Medicine

## 2022-03-24 DIAGNOSIS — S22080D Wedge compression fracture of T11-T12 vertebra, subsequent encounter for fracture with routine healing: Secondary | ICD-10-CM

## 2022-03-25 NOTE — Telephone Encounter (Signed)
Requested medication (s) are due for refill today: no  Requested medication (s) are on the active medication list: no  Last refill:  09/26/21. Discontinued on 11/28/21  Future visit scheduled: yes  Notes to clinic:  Please review for refill. Refill not delegated per protocol.     Requested Prescriptions  Pending Prescriptions Disp Refills   tiZANidine (ZANAFLEX) 4 MG tablet [Pharmacy Med Name: TIZANIDINE HCL 4 MG TABLET] 30 tablet 1    Sig: TAKE 1 TABLET BY MOUTH EVERY 6 HOURS AS NEEDED FOR MUSCLE SPASMS.     Not Delegated - Cardiovascular:  Alpha-2 Agonists - tizanidine Failed - 03/24/2022 10:48 AM      Failed - This refill cannot be delegated      Passed - Valid encounter within last 6 months    Recent Outpatient Visits           3 months ago Hordeolum externum of left upper eyelid   [facility] Mecum, Erin E, PA-C   6 months ago Compression fracture of T12 vertebra with routine healing, subsequent encounter   [facility] Margarita Mail, DO   7 months ago Compression fracture of T12 vertebra with routine healing, subsequent encounter   [facility] Margarita Mail, DO   8 months ago Amenorrhea   [facility] Margarita Mail, DO       Future Appointments             In 2 days Margarita Mail, DO [facility]

## 2022-03-26 NOTE — Progress Notes (Unsigned)
Established Patient Office Visit  Subjective:  Patient ID: Anna Flores, female    DOB: 12/02/1990  Age: 31 y.o. MRN: [id number]  CC:  No chief complaint on file.   HPI Anna Flores presents for follow up.   Medical History: breast mass abscess on right in July, 2022. Was most likely a clogged milk duct as she was breastfeeding at the time, underwent an aspiration and was admitted to hospital for 7 days with breast abscess. Denies breast pain, masses/lumps or abnormal breast discharge now.   Iron deficiency anemia - not currently on oral iron, couldn't tolerate. She had to have Venofer infusions during last pregnancy in 2021.   Bipolar/GAD: sees a therapist every week and follows with Psychiatrist. Currently on Seroquel 25 mg, Latuda 40 mg, and Lamictal 150 mg daily.  She is seeing Psych in a week. Xanax as needed and at bedtime.   History of gestational diabetes in third pregnancy - never on medication, just controlled with diet. No issues with last 2 pregnancies.   Health Maintenance: -Blood work up to date -Pap   Past Medical History:  Diagnosis Date   Abdominal pain 10/26/2013   Anemia 2013   Anxiety    Back pain affecting pregnancy in third trimester 02/28/2020   Bipolar disorder (HCC)    Depression    History of Papanicolaou smear of cervix 10/02/2016   NEG   IDA (iron deficiency anemia) 02/29/2020   Indication for care in labor and delivery, antepartum 03/07/2020   Mastitis 01/19/2021   right   Menometrorrhagia 10/26/2013   PONV (postoperative nausea and vomiting)    Postpartum galactocele 02/01/2021   Pregnancy    DELIVERED 08/12/16 - JEG    Past Surgical History:  Procedure Laterality Date   DIAGNOSTIC LAPAROSCOPY  2018   suspected ectopic pregnancy-[facility]   DILATION AND EVACUATION N/A 06/11/2021   Procedure: D & C;  Surgeon: Conard Novak, MD;  Location: [facility];  Service: Gynecology;  Laterality: N/A;   INCISION AND DRAINAGE ABSCESS Right 02/06/2021    Procedure: INCISION AND DRAINAGE ABSCESS;  Surgeon: Leafy Ro, MD;  Location: [facility];  Service: General;  Laterality: Right;   LAPAROSCOPY N/A 06/11/2021   Procedure: LAPAROSCOPY DIAGNOSTIC;  Surgeon: Conard Novak, MD;  Location: [facility];  Service: Gynecology;  Laterality: N/A;   TONSILLECTOMY     XI ROBOTIC ASSISTED SALPINGECTOMY Bilateral 07/24/2021   Procedure: XI ROBOTIC ASSISTED SALPINGECTOMY;  Surgeon: [name], MD;  Location: [facility];  Service: Gynecology;  Laterality: Bilateral;    Family History  Adopted: Yes  Problem Relation Age of Onset   Nevi Sister        DISPLASTIC NEVUS   Ovarian cancer Mother    Cirrhosis Father     Social History   Socioeconomic History   Marital status: Married    Spouse name: [name]    Number of children: 5   Years of education: 3   Highest education level: Not on file  Occupational History   Occupation: HOMEMAKER  Tobacco Use   Smoking status: Never   Smokeless tobacco: Never  Vaping Use   Vaping Use: Never used  Substance and Sexual Activity   Alcohol use: Yes    Comment: occassional   Drug use: No   Sexual activity: Yes    Birth control/protection: Surgical    Comment: Vasectomy  Other Topics Concern   Not on file  Social History Narrative   Not on file   Social  Determinants of Health   Financial Resource Strain: Low Risk  (07/22/2018)   Overall Financial Resource Strain (CARDIA)    Difficulty of Paying Living Expenses: Not hard at all  Food Insecurity: No Food Insecurity (07/22/2018)   Hunger Vital Sign    Worried About Running Out of Food in the Last Year: Never true    Ran Out of Food in the Last Year: Never true  Transportation Needs: No Transportation Needs (07/22/2018)   PRAPARE - Administrator, Civil Service (Medical): No    Lack of Transportation (Non-Medical): No  Physical Activity: Insufficiently Active (07/22/2018)   Exercise Vital Sign    Days of Exercise per Week: 4  days    Minutes of Exercise per Session: 30 min  Stress: Stress Concern Present (07/22/2018)   [employer] of Occupational Health - Occupational Stress Questionnaire    Feeling of Stress : Rather much  Social Connections: Moderately Integrated (07/22/2018)   Social Connection and Isolation Panel [NHANES]    Frequency of Communication with Friends and Family: More than three times a week    Frequency of Social Gatherings with Friends and Family: More than three times a week    Attends Religious Services: Never    Database administrator or Organizations: Yes    Attends Banker Meetings: Never    Marital Status: Married  Catering manager Violence: Not on file    ROS Review of Systems  Constitutional:  Positive for fatigue. Negative for chills and fever.  Eyes:  Negative for visual disturbance.  Respiratory:  Negative for cough and shortness of breath.   Cardiovascular:  Negative for chest pain.  Gastrointestinal:  Negative for abdominal pain, nausea and vomiting.  Genitourinary:  Negative for dysuria, flank pain, frequency, hematuria, urgency, vaginal bleeding, vaginal discharge and vaginal pain.  Neurological:  Negative for dizziness and headaches.    Objective:   Today's Vitals: There were no vitals taken for this visit.  Physical Exam Constitutional:      Appearance: Normal appearance.  HENT:     Head: Normocephalic and atraumatic.  Eyes:     Conjunctiva/sclera: Conjunctivae normal.  Cardiovascular:     Rate and Rhythm: Normal rate and regular rhythm.  Pulmonary:     Effort: Pulmonary effort is normal.     Breath sounds: Normal breath sounds.  Chest:  Breasts:    Right: Normal. No swelling, bleeding, inverted nipple, mass, nipple discharge, skin change or tenderness.  Abdominal:     General: There is no distension.     Palpations: Abdomen is soft.     Tenderness: There is no abdominal tenderness. There is no right CVA tenderness, left CVA tenderness  or guarding.  Musculoskeletal:        General: Tenderness present. No swelling. Normal range of motion.     Right lower leg: No edema.     Left lower leg: No edema.     Comments: Tenderness to palpation along ball of foot and arch on plantar aspect of right foot.  Skin:    General: Skin is warm and dry.     Comments: Post-op scars healing well  Neurological:     General: No focal deficit present.     Mental Status: She is alert. Mental status is at baseline.  Psychiatric:        Mood and Affect: Mood normal.        Behavior: Behavior normal.     Assessment & Plan:   1.  Amenorrhea/Retained products of conception after miscarriage: She is concerned that she may be pregnant again, as her LMP was in September. She did a home test about 2 weeks ago that was negative. We can do another one today, it is not abnormal to be irregular after physiologic stress with surgery but cautioned to use condoms until planned tubal ligation next month. We can recheck a urine pregnancy test today. She is following with Gynecology.   - POCT urine pregnancy  2. Pyelonephritis: Resolved. Asymptomatic, antibiotic course complete. Recheck UA in the office today negative for signs of infection.   - POCT Urinalysis Dipstick  3. Other iron deficiency anemia: Most recent CBC showing a hgb of 11.1. Has some fatigue but has 5 children. Will monitor pre-op labs in 1 month. Discussed taking an oral iron supplement every other day to help with tolerability.   4. GAD (generalized anxiety disorder)/Bipolar 1 disorder (HCC): Doesn't feel like Kasandra Knudsen is helping with manic symptoms, she is following with Psychiatry in 1 week to discuss.   5. Plantar fasciitis of right foot: Mild pain to palpation along plantar aspect of right foot consistent with plantar fascitis. Discussed more supportive footwear, stretches and anti-inflammatories as needed.   6. History of breast abscess: Resolved. Breast exam normal today.     Follow-up: No follow-ups on file.   Margarita Mail, DO

## 2022-03-27 ENCOUNTER — Ambulatory Visit: Payer: Medicaid Other | Admitting: Internal Medicine

## 2022-03-27 ENCOUNTER — Encounter: Payer: Self-pay | Admitting: Internal Medicine

## 2022-03-27 VITALS — BP 118/72 | HR 85 | Temp 98.1°F | Resp 16 | Ht 65.0 in | Wt 218.6 lb

## 2022-03-27 DIAGNOSIS — R739 Hyperglycemia, unspecified: Secondary | ICD-10-CM

## 2022-03-27 DIAGNOSIS — S22080D Wedge compression fracture of T11-T12 vertebra, subsequent encounter for fracture with routine healing: Secondary | ICD-10-CM

## 2022-03-27 DIAGNOSIS — F319 Bipolar disorder, unspecified: Secondary | ICD-10-CM | POA: Diagnosis not present

## 2022-03-27 MED ORDER — TIZANIDINE HCL 4 MG PO TABS: 4.0000 mg | ORAL_TABLET | Freq: Four times a day (QID) | ORAL | 1 refills | Status: DC | PRN

## 2022-03-27 NOTE — Patient Instructions (Addendum)
It was great seeing you today!  Plan discussed at today's visit: -Muscle relaxer refilled -Recommend eating more omega 3 fatty acids to help reduce triglycerides, more info below  Follow up in: 6 months, plan for fasting labs then   Take care and let us know if you have any questions or concerns prior to your next visit.  Dr. Caralee Ates  Fish Oil, Omega-3 Fatty Acids Capsules (OTC) What is this medication? FISH OIL, OMEGA-3 FATTY ACIDS (fish oyl, oh MEH guh three FA tee A suhds) support heart, brain, and eye health. They may also decrease inflammation. Omega-3 fatty acids are essential fats the body needs to support overall health. This supplement is not intended to diagnose, treat, cure, or prevent any disease. This medicine may be used for other purposes; ask your health care provider or pharmacist if you have questions. COMMON BRAND NAME(S): Omega MonoPure EPA, Omega-3, Omega-3 Fish Oil, OmegaPure 780, OmegaPure 900, TherOmega, THEROMEGA SPORT What should I tell my care team before I take this medication? They need to know if you have any of these conditions Bleeding problems Lung or breathing disease, like asthma An unusual or allergic reaction to fish oil, omega-3 fatty acids, fish, other medications, foods, dyes, or preservatives Pregnant or trying to get pregnant Breast-feeding How should I use this medication? Take this medication by mouth with a glass of water. Follow the directions on the package or prescription label. Take with food. Take your medication at regular intervals. Do not take your medication more often than directed. Talk to your care team about the use of this medication in children. Special care may be needed. This medication should not be used in children without a care team's advice. Overdosage: If you think you have taken too much of this medicine contact a poison control center or emergency room at once. NOTE: This medicine is only for you. Do not share this  medicine with others. What if I miss a dose? If you miss a dose, take it as soon as you can. If it is almost time for your next dose, take only that dose. Do not take double or extra doses. What may interact with this medication? Aspirin and aspirin-like medications Herbal products like danshen, dong quai, garlic pills, ginger, ginkgo biloba, horse chestnut, willow bark, and others Medications that treat or prevent blood clots like enoxaparin, heparin, warfarin This list may not describe all possible interactions. Give your health care provider a list of all the medicines, herbs, non-prescription drugs, or dietary supplements you use. Also tell them if you smoke, drink alcohol, or use illegal drugs. Some items may interact with your medicine. What should I watch for while using this medication? Follow a good diet and exercise plan. Taking a dietary supplement does not replace a healthy lifestyle. Some foods that have omega-3 fatty acids naturally are fatty fish like albacore tuna, halibut, herring, mackerel, lake trout, salmon, and sardines. Too much of this medication can be unsafe. Talk to your care team about how much of this medication is right for you. If you are scheduled for any medical or dental procedure, tell your care team that you are taking this medication. You may need to stop taking this medication before the procedure. Herbal or dietary supplements are not regulated like medications. Rigid quality control standards are not required for dietary supplements. The purity and strength of these products can vary. The safety and effect of this dietary supplement for a certain disease or illness is not well known.  This product is not intended to diagnose, treat, cure or prevent any disease. The Food and Drug Administration suggests the following to help consumers protect themselves: Always read product labels and follow directions. Natural does not mean a product is safe for humans to  take. Look for products that include USP after the ingredient name. This means that the manufacturer followed the standards of the [place] Pharmacopoeia. Products made or sold by a nationally known food or drug company are more likely to be made under tight controls. You can write to the company for more information about how the product was made. What side effects may I notice from receiving this medication? Side effects that you should report to your care team as soon as possible: Allergic reactions--skin rash, itching, hives, swelling of the face, lips, tongue, or throat Side effects that usually do not require medical attention (report to your care team if they continue or are bothersome): Bad breath Burping Fishy aftertaste Heartburn Upset stomach This list may not describe all possible side effects. Call your doctor for medical advice about side effects. You may report side effects to FDA at 1-800-FDA-1088. Where should I keep my medication? Keep out of the reach of children. Store at room temperature or as directed on the package label. Protect from moisture. Do not freeze. Throw away any unused medication after the expiration date. NOTE: This sheet is a summary. It may not cover all possible information. If you have questions about this medicine, talk to your doctor, pharmacist, or health care provider.  2023 Elsevier/Gold Standard (2020-10-11 00:00:00)

## 2022-04-22 DIAGNOSIS — F3112 Bipolar disorder, current episode manic without psychotic features, moderate: Secondary | ICD-10-CM | POA: Diagnosis not present

## 2022-04-22 DIAGNOSIS — F411 Generalized anxiety disorder: Secondary | ICD-10-CM | POA: Diagnosis not present

## 2022-05-10 ENCOUNTER — Encounter: Payer: Self-pay | Admitting: Oncology

## 2022-05-17 ENCOUNTER — Other Ambulatory Visit: Payer: Medicaid Other

## 2022-06-24 DIAGNOSIS — F3112 Bipolar disorder, current episode manic without psychotic features, moderate: Secondary | ICD-10-CM | POA: Diagnosis not present

## 2022-06-24 DIAGNOSIS — F411 Generalized anxiety disorder: Secondary | ICD-10-CM | POA: Diagnosis not present

## 2022-09-25 DIAGNOSIS — F411 Generalized anxiety disorder: Secondary | ICD-10-CM | POA: Diagnosis not present

## 2022-09-25 DIAGNOSIS — F3112 Bipolar disorder, current episode manic without psychotic features, moderate: Secondary | ICD-10-CM | POA: Diagnosis not present

## 2022-09-27 ENCOUNTER — Ambulatory Visit: Payer: Medicaid Other | Admitting: Internal Medicine

## 2022-10-04 DIAGNOSIS — Z3202 Encounter for pregnancy test, result negative: Secondary | ICD-10-CM | POA: Diagnosis not present

## 2022-10-04 DIAGNOSIS — F419 Anxiety disorder, unspecified: Secondary | ICD-10-CM | POA: Diagnosis not present

## 2022-10-04 DIAGNOSIS — J069 Acute upper respiratory infection, unspecified: Secondary | ICD-10-CM | POA: Diagnosis not present

## 2022-10-04 DIAGNOSIS — Z20822 Contact with and (suspected) exposure to covid-19: Secondary | ICD-10-CM | POA: Diagnosis not present

## 2022-10-04 DIAGNOSIS — Z6824 Body mass index (BMI) 24.0-24.9, adult: Secondary | ICD-10-CM | POA: Diagnosis not present

## 2022-10-04 DIAGNOSIS — F319 Bipolar disorder, unspecified: Secondary | ICD-10-CM | POA: Diagnosis not present

## 2022-10-06 NOTE — Progress Notes (Unsigned)
Established Patient Office Visit  Subjective:  Patient ID: Anna Flores, female    DOB: 06-16-1991  Age: 32 y.o. MRN: [id number]  CC:  No chief complaint on file.   HPI Anna Flores presents for follow up.  Medical History: breast mass abscess on right in July, 2022. Was most likely a clogged milk duct as she was breastfeeding at the time, underwent an aspiration and was admitted to hospital for 7 days with breast abscess. Denies breast pain, masses/lumps or abnormal breast discharge now.   Hx of Iron deficiency anemia - not currently on oral iron, couldn't tolerate. She had to have Venofer infusions during last pregnancy in 2021. Last CBC 13.8 2/23, MCV normal.  Bipolar/GAD: Currently on Abilify 20 mg, Lamictal 200 mg, Buspar 10 TID, Trazodone 100 mg. Xanax as needed and at bedtime. Follows with Psychiatry About to restart therapy.   History of gestational diabetes in third pregnancy - never on medication, just controlled with diet. No issues with last 2 pregnancies. Glucose on last BMP 2/23 normal.  Hx of T12 Compression Fracture: -Generally without pain, had some mild aching pain yesterday but was driving most of the day -Taking Zanaflex PRN which is helping  Hypertriglyceridemia: -Not on any medication currently  Lipid Panel     Component Value Date/Time   CHOL 153 09/26/2021 1108   TRIG 224 (H) 09/26/2021 1108   HDL 35 (L) 09/26/2021 1108   CHOLHDL 4.4 09/26/2021 1108   Fraser 87 09/26/2021 1108    Health Maintenance: -Blood work due  -Pap will be due in the fall, last 04/2019 normal   Past Medical History:  Diagnosis Date   Abdominal pain 10/26/2013   Anemia 2013   Anxiety    Back pain affecting pregnancy in third trimester 02/28/2020   Bipolar disorder ([place])    Depression    History of Papanicolaou smear of cervix 10/02/2016   NEG   IDA (iron deficiency anemia) 02/29/2020   Indication for care in labor and delivery, antepartum 03/07/2020   Mastitis 01/19/2021    right   Menometrorrhagia 10/26/2013   PONV (postoperative nausea and vomiting)    Postpartum galactocele 02/01/2021   Pregnancy    DELIVERED 08/12/16 - JEG    Past Surgical History:  Procedure Laterality Date   DIAGNOSTIC LAPAROSCOPY  2018   suspected ectopic pregnancy-[facility]   DILATION AND EVACUATION N/A 06/11/2021   Procedure: D & C;  Surgeon: Will Bonnet, MD;  Location: [facility];  Service: Gynecology;  Laterality: N/A;   INCISION AND DRAINAGE ABSCESS Right 02/06/2021   Procedure: INCISION AND DRAINAGE ABSCESS;  Surgeon: Jules Husbands, MD;  Location: [facility];  Service: General;  Laterality: Right;   LAPAROSCOPY N/A 06/11/2021   Procedure: LAPAROSCOPY DIAGNOSTIC;  Surgeon: Will Bonnet, MD;  Location: [facility];  Service: Gynecology;  Laterality: N/A;   TONSILLECTOMY     XI ROBOTIC ASSISTED SALPINGECTOMY Bilateral 07/24/2021   Procedure: XI ROBOTIC ASSISTED SALPINGECTOMY;  Surgeon: Homero Fellers, MD;  Location: [facility];  Service: Gynecology;  Laterality: Bilateral;    Family History  Adopted: Yes  Problem Relation Age of Onset   Nevi Sister        DISPLASTIC NEVUS   Ovarian cancer Mother    Cirrhosis Father     Social History   Socioeconomic History   Marital status: Married    Spouse name: Lysbeth Galas    Number of children: 5   Years of education: 3   Highest education level:  Not on file  Occupational History   Occupation: HOMEMAKER  Tobacco Use   Smoking status: Never   Smokeless tobacco: Never  Vaping Use   Vaping Use: Never used  Substance and Sexual Activity   Alcohol use: Yes    Comment: occassional   Drug use: No   Sexual activity: Yes    Birth control/protection: Surgical    Comment: Vasectomy  Other Topics Concern   Not on file  Social History Narrative   Not on file   Social Determinants of Health   Financial Resource Strain: Low Risk  (07/22/2018)   Overall Financial Resource Strain (CARDIA)    Difficulty of Paying Living  Expenses: Not hard at all  Food Insecurity: No Food Insecurity (07/22/2018)   Hunger Vital Sign    Worried About Running Out of Food in the Last Year: Never true    Ran Out of Food in the Last Year: Never true  Transportation Needs: No Transportation Needs (07/22/2018)   PRAPARE - Hydrologist (Medical): No    Lack of Transportation (Non-Medical): No  Physical Activity: Insufficiently Active (07/22/2018)   Exercise Vital Sign    Days of Exercise per Week: 4 days    Minutes of Exercise per Session: 30 min  Stress: Stress Concern Present (07/22/2018)   [place]    Feeling of Stress : Rather much  Social Connections: Moderately Integrated (07/22/2018)   Social Connection and Isolation Panel [NHANES]    Frequency of Communication with Friends and Family: More than three times a week    Frequency of Social Gatherings with Friends and Family: More than three times a week    Attends Religious Services: Never    Marine scientist or Organizations: Yes    Attends Archivist Meetings: Never    Marital Status: Married  Human resources officer Violence: Not on file    ROS Review of Systems  Constitutional:  Negative for chills and fever.  Respiratory:  Negative for cough and shortness of breath.   Cardiovascular:  Negative for chest pain.  Gastrointestinal:  Negative for abdominal pain, nausea and vomiting.  Musculoskeletal:  Negative for back pain.  Neurological:  Negative for dizziness and headaches.    Objective:   Today's Vitals: There were no vitals taken for this visit.  Physical Exam Constitutional:      Appearance: Normal appearance.  HENT:     Head: Normocephalic and atraumatic.  Eyes:     Conjunctiva/sclera: Conjunctivae normal.  Cardiovascular:     Rate and Rhythm: Normal rate and regular rhythm.  Pulmonary:     Effort: Pulmonary effort is normal.     Breath  sounds: Normal breath sounds.  Chest:  Breasts:    Right: Normal. No swelling, bleeding, inverted nipple, mass, nipple discharge, skin change or tenderness.  Musculoskeletal:     Right lower leg: No edema.     Left lower leg: No edema.  Skin:    General: Skin is warm and dry.  Neurological:     General: No focal deficit present.     Mental Status: She is alert. Mental status is at baseline.  Psychiatric:        Mood and Affect: Mood normal.        Behavior: Behavior normal.     Assessment & Plan:   1. Hyperglycemia: Discussed lipid panel results with the patient. Triglycerides high at 225. Recommend fish oil and increasing  omega 3 fatty acids in the diet. Plan to recheck fasting labs at follow up.   2. Compression fracture of T12 vertebra with routine healing, subsequent encounter: Overall healing well, pain controlled. Refill Zanaflex 4 mg to take as needed.   - tiZANidine (ZANAFLEX) 4 MG tablet; Take 1 tablet (4 mg total) by mouth every 6 (six) hours as needed for muscle spasms.  Dispense: 30 tablet; Refill: 1  3. Bipolar 1 disorder ([place]): Stable, reviewed medications with the patient. Following with Psychiatry and therapy.   Follow-up: No follow-ups on file.   Teodora Medici, DO

## 2022-10-07 ENCOUNTER — Ambulatory Visit: Payer: Medicaid Other | Admitting: Internal Medicine

## 2022-10-07 ENCOUNTER — Encounter: Payer: Self-pay | Admitting: Internal Medicine

## 2022-10-07 VITALS — BP 126/70 | HR 96 | Resp 16 | Ht 65.0 in | Wt 225.0 lb

## 2022-10-07 DIAGNOSIS — F319 Bipolar disorder, unspecified: Secondary | ICD-10-CM | POA: Diagnosis not present

## 2022-10-07 DIAGNOSIS — D508 Other iron deficiency anemias: Secondary | ICD-10-CM | POA: Diagnosis not present

## 2022-10-07 DIAGNOSIS — R739 Hyperglycemia, unspecified: Secondary | ICD-10-CM | POA: Diagnosis not present

## 2022-10-07 DIAGNOSIS — S22080D Wedge compression fracture of T11-T12 vertebra, subsequent encounter for fracture with routine healing: Secondary | ICD-10-CM

## 2022-10-07 MED ORDER — TIZANIDINE HCL 4 MG PO TABS: 4.0000 mg | ORAL_TABLET | Freq: Four times a day (QID) | ORAL | 1 refills | Status: DC | PRN

## 2022-10-07 MED ORDER — ACCU-CHEK SOFTCLIX LANCETS MISC: 12 refills | Status: DC

## 2022-10-07 MED ORDER — ACCU-CHEK GUIDE VI STRP: ORAL_STRIP | 12 refills | Status: DC

## 2022-10-07 NOTE — Patient Instructions (Addendum)
It was great seeing you today!  Plan discussed at today's visit: -Medication refilled  -Test strips and lancets refilled -Try to work on decreasing sugar and carbs in the diet, increase protein in general to help with sugar regulation  -Plan for fasting labs   Follow up in: 1 month   Take care and let us know if you have any questions or concerns prior to your next visit.  Dr. Rosana Berger

## 2022-10-08 ENCOUNTER — Other Ambulatory Visit: Payer: Self-pay

## 2022-10-08 DIAGNOSIS — R739 Hyperglycemia, unspecified: Secondary | ICD-10-CM

## 2022-10-08 MED ORDER — ACCU-CHEK GUIDE VI STRP: ORAL_STRIP | 12 refills | Status: DC

## 2022-10-23 DIAGNOSIS — F411 Generalized anxiety disorder: Secondary | ICD-10-CM | POA: Diagnosis not present

## 2022-10-23 DIAGNOSIS — F3112 Bipolar disorder, current episode manic without psychotic features, moderate: Secondary | ICD-10-CM | POA: Diagnosis not present
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
## 2022-11-11 ENCOUNTER — Encounter: Payer: Medicaid Other | Admitting: Internal Medicine

## 2022-11-11 ENCOUNTER — Encounter: Payer: Self-pay | Admitting: Internal Medicine

## 2022-11-11 DIAGNOSIS — Z124 Encounter for screening for malignant neoplasm of cervix: Secondary | ICD-10-CM

## 2022-11-11 DIAGNOSIS — Z0000 Encounter for general adult medical examination without abnormal findings: Secondary | ICD-10-CM

## 2022-11-15 ENCOUNTER — Encounter: Payer: Self-pay | Admitting: Physician Assistant

## 2022-11-15 ENCOUNTER — Ambulatory Visit: Payer: Self-pay | Admitting: *Deleted

## 2022-11-15 ENCOUNTER — Ambulatory Visit
Admission: RE | Admit: 2022-11-15 | Discharge: 2022-11-15 | Disposition: A | Discharge status: Home / Self Care | Payer: Medicaid Other | Source: Ambulatory Visit | Attending: Physician Assistant | Admitting: Physician Assistant

## 2022-11-15 ENCOUNTER — Ambulatory Visit
Admission: RE | Admit: 2022-11-15 | Discharge: 2022-11-15 | Disposition: A | Discharge status: Home / Self Care | Payer: Medicaid Other | Attending: Physician Assistant | Admitting: Physician Assistant

## 2022-11-15 ENCOUNTER — Ambulatory Visit: Payer: Medicaid Other | Admitting: Physician Assistant

## 2022-11-15 VITALS — BP 102/68 | HR 88 | Temp 97.9°F | Resp 16 | Ht 65.0 in | Wt 226.6 lb

## 2022-11-15 DIAGNOSIS — S4991XA Unspecified injury of right shoulder and upper arm, initial encounter: Secondary | ICD-10-CM

## 2022-11-15 DIAGNOSIS — S46091A Other injury of muscle(s) and tendon(s) of the rotator cuff of right shoulder, initial encounter: Secondary | ICD-10-CM | POA: Diagnosis not present

## 2022-11-15 DIAGNOSIS — M25511 Pain in right shoulder: Secondary | ICD-10-CM | POA: Diagnosis not present

## 2022-11-15 MED ORDER — MELOXICAM 15 MG PO TABS
15.0000 mg | ORAL_TABLET | Freq: Every day | ORAL | 0 refills | Status: DC
Start: 2022-11-15 — End: 2023-05-26

## 2022-11-15 NOTE — Telephone Encounter (Signed)
  Chief Complaint: R shoulder injury- pain Symptoms: pain in shoulder- unable to move normally Frequency: started last night at work  Disposition: [] ED /[] Urgent Care (no appt availability in office) / [x] Appointment(In office/virtual)/ []  [place] [facility]/ [] Home Care/ [] Refused Recommended Disposition /[] [place] [facility]/ []  Follow-up with PCP Additional Notes: Patient has been scheduled in office for evaluation- care advice has been given

## 2022-11-15 NOTE — Progress Notes (Signed)
Acute Office Visit   Patient: Anna Flores   DOB: 12/08/1990   32 y.o. Female  MRN: [id number] Visit Date: 11/15/2022  Today's healthcare provider: Oswaldo Conroy Delshawn Stech, PA-C  Introduced myself to the patient as a Secondary school teacher and provided education on APPs in clinical practice.    Chief Complaint  Patient presents with   Shoulder Injury    Righgt shoulder last night at work might have lifted more than she should of   Subjective    HPI HPI     Shoulder Injury    Additional comments: Righgt shoulder last night at work might have lifted more than she should of      Last edited by Forde Radon, CMA on 11/15/2022 10:07 AM.       She is right hand dominant   Onset: sudden following lifting heavy cooler  Duration: since last night  Location: right anterior shoulder and shoulder blade  Radiation: down right arm into fingers- tingling sensation   She reports last night she had to lift a cooler with water in it and felt a popping sensation in her right shoulder followed by pain  Pain level and character: 8/10 She reports a pulling and burning sensation in the shoulder joint and tingling sensation down her arm and into hand  She reports pain has spread this AM to include the back of her shoulder  Other associated symptoms: Interventions: Tylenol and ibuprofen  Alleviating: nothing  Aggravating: she is in pain at rest, reports increased pain with lifting her shoulder or rotating wrist    Medications: Outpatient Medications Prior to Visit  Medication Sig   Accu-Chek Softclix Lancets lancets Use as instructed   ALPRAZolam (XANAX) 0.5 MG tablet Take 0.25 mg by mouth 2 (two) times daily as needed for sleep or anxiety.   ARIPiprazole (ABILIFY) 20 MG tablet Take 20 mg by mouth daily.   busPIRone (BUSPAR) 10 MG tablet Take 10 mg by mouth 3 (three) times daily.   gabapentin (NEURONTIN) 300 MG capsule Take 100 mg by mouth 3 (three) times daily as needed.   glucose blood  (ACCU-CHEK GUIDE) test strip Use as instructed   lamoTRIgine (LAMICTAL) 200 MG tablet Take 200 mg by mouth at bedtime.   tiZANidine (ZANAFLEX) 4 MG tablet Take 1 tablet (4 mg total) by mouth every 6 (six) hours as needed for muscle spasms.   traZODone (DESYREL) 100 MG tablet Take 150 mg by mouth at bedtime as needed.   No facility-administered medications prior to visit.    Review of Systems  Musculoskeletal:  Positive for arthralgias and myalgias.  Neurological:  Positive for numbness.       Objective    BP 102/68   Pulse 88   Temp 97.9 F (36.6 C) (Oral)   Resp 16   Ht  (1.651 m)   Wt 226 lb 9.6 oz (102.8 kg)   SpO2 99%   BMI 37.71 kg/m    Physical Exam Vitals reviewed.  Constitutional:      General: She is awake.     Appearance: Normal appearance. She is well-developed and well-groomed.  HENT:     Head: Normocephalic and atraumatic.  Pulmonary:     Effort: Pulmonary effort is normal.  Musculoskeletal:     Right shoulder: Tenderness (very tender to light palpation along AC joint and body of shoulder blade posteriorly) and crepitus present. Decreased range of motion. Decreased strength. Normal pulse.     Left  shoulder: Normal. No tenderness. Normal range of motion. Normal strength. Normal pulse.     Right wrist: Normal range of motion. Normal pulse.     Left wrist: Normal range of motion. Normal pulse.     Right hand: Normal range of motion. Normal strength. Normal capillary refill. Normal pulse.     Left hand: Normal range of motion. Normal strength. Normal capillary refill. Normal pulse.     Comments: Right shoulder ROM findings Passive ROM is difficult due to pain with extension of arm, gentle rotation, and flexion Forearm rotation with right arm in neutral position is painful  Active ROM noted below: Abduction limited  to 90 degrees with pain  Forward flexion limited to approx 100  Extension limited to neutral position She is unable to bring right hand  behind head or behind back (internal and external rotation are not assessable)  Empty can test positive of right side   Neurological:     Mental Status: She is alert.  Psychiatric:        Behavior: Behavior is cooperative.       No results found for any visits on 11/15/22.  Assessment & Plan      No follow-ups on file.      Problem List Items Addressed This Visit   None Visit Diagnoses     Right shoulder injury, initial encounter    -  Primary Acute, new concern She reports trying to lift a cooler last night to dump water and heard a popping sound followed by pain in her right shoulder ROM is significantly limited today and she reports persistent pain in right shoulder joint even at rest Will order xray of right shoulder for osseous injury rule out but given her symptoms and HPI I am concerned for rotator cuff injury Will order MRI for evaluation of soft tissues and place referral to Orthopedics for assistance with management Recommend she use a sling to help immobilize arm, can use warm or cool compresses to assist with pain Will provide script for Meloxicam 15 mg PO QD to assist with inflammation and pain Reviewed Tylenol dosing for further management Imaging results to further dictate management and will defer to Ortho recommendations as needed Follow up as needed for persistent or progressing symptoms     Relevant Medications   meloxicam (MOBIC) 15 MG tablet   Other Relevant Orders   DG Shoulder Right (Completed)   Ambulatory referral to Orthopedics   MR Shoulder Right Wo Contrast   Other injury of muscle(s) and tendon(s) of the rotator cuff of right shoulder, initial encounter     See above for A&P   Relevant Orders   Ambulatory referral to Orthopedics   MR Shoulder Right Wo Contrast        No follow-ups on file.   I, Shanavia Makela E Tavio Biegel, PA-C, have reviewed all documentation for this visit. The documentation on 11/15/22 for the exam, diagnosis, procedures, and  orders are all accurate and complete.   Jacquelin Hawking, MHS, PA-C [facility]

## 2022-11-15 NOTE — Telephone Encounter (Signed)
Reason for Disposition  [1] SEVERE pain AND [2] not improved 2 hours after pain medicine/ice packs  Answer Assessment - Initial Assessment Questions 1. MECHANISM: "How did the injury happen?"     Lifting cooler-emptying cooler- R shoulder pain 2. ONSET: "When did the injury happen?" (Minutes or hours ago)      Last night 3. APPEARANCE of INJURY: "What does the injury look like?"      unsure 4. SEVERITY: "Can you move the shoulder normally?"      no 5. SIZE: For cuts, bruises, or swelling, ask: "How large is it?" (e.g., inches or centimeters;  entire joint)      unsure 6. PAIN: "Is there pain?" If Yes, ask: "How bad is the pain?"   (e.g., Scale 1-10; or mild, moderate, severe)   - MILD (1-3): doesn't interfere with normal activities   - MODERATE (4-7): interferes with normal activities (e.g., work or school) or awakens from sleep   - SEVERE (8-10): excruciating pain, unable to do any normal activities, unable to move arm at all due to pain     severe 7. TETANUS: For any breaks in the skin, ask: "When was the last tetanus booster?"     na 8. OTHER SYMPTOMS: "Do you have any other symptoms?" (e.g., loss of sensation)     Pain behind shoulder, tingling in fingers  Protocols used: Shoulder Injury-A-AH

## 2022-11-15 NOTE — Progress Notes (Signed)
Your shoulder xray did not show evidence of fracture or dislocation at this time. Observable soft tissues did  not appear to be injured or swollen but this is limited with xray evaluation.

## 2022-11-15 NOTE — Patient Instructions (Addendum)
I recommend getting a sling to help with immobilizing your right arm   I have sent in a script for Meloxicam 15 mg to be taken by mouth once per day. This an NSAID so you do not need to take other NSAIDs while using it. Take this with food and plenty of water. You can use Tylenol as needed for further pain management  You should hear back from the scheduling department soon to get in with Orthopedics and to have your MRI set up  You can use warm compresses on the joint to help with pain as tolerated.  If needed you can take up to 3500 mg of Tylenol per 24 hours and 3000 mg of Ibuprofen per 24 hours (Ibuprofen should be used if you do not wish to take the Meloxicam, do not use together.)

## 2022-11-20 DIAGNOSIS — F411 Generalized anxiety disorder: Secondary | ICD-10-CM | POA: Diagnosis not present

## 2022-11-20 DIAGNOSIS — F3112 Bipolar disorder, current episode manic without psychotic features, moderate: Secondary | ICD-10-CM | POA: Diagnosis not present

## 2022-11-26 DIAGNOSIS — F333 Major depressive disorder, recurrent, severe with psychotic symptoms: Secondary | ICD-10-CM | POA: Diagnosis not present

## 2022-11-26 DIAGNOSIS — Z9151 Personal history of suicidal behavior: Secondary | ICD-10-CM | POA: Diagnosis not present

## 2022-11-26 DIAGNOSIS — R45851 Suicidal ideations: Secondary | ICD-10-CM | POA: Diagnosis not present

## 2022-11-26 DIAGNOSIS — F319 Bipolar disorder, unspecified: Secondary | ICD-10-CM | POA: Diagnosis not present

## 2022-11-26 DIAGNOSIS — F419 Anxiety disorder, unspecified: Secondary | ICD-10-CM | POA: Diagnosis not present

## 2022-11-26 DIAGNOSIS — F4310 Post-traumatic stress disorder, unspecified: Secondary | ICD-10-CM | POA: Diagnosis not present

## 2022-11-27 DIAGNOSIS — F333 Major depressive disorder, recurrent, severe with psychotic symptoms: Secondary | ICD-10-CM | POA: Diagnosis not present

## 2022-11-27 DIAGNOSIS — F419 Anxiety disorder, unspecified: Secondary | ICD-10-CM | POA: Diagnosis not present

## 2022-11-27 DIAGNOSIS — R45851 Suicidal ideations: Secondary | ICD-10-CM | POA: Diagnosis not present

## 2022-11-27 DIAGNOSIS — F4310 Post-traumatic stress disorder, unspecified: Secondary | ICD-10-CM | POA: Diagnosis not present

## 2022-11-27 DIAGNOSIS — Z9151 Personal history of suicidal behavior: Secondary | ICD-10-CM | POA: Diagnosis not present

## 2022-11-27 DIAGNOSIS — F319 Bipolar disorder, unspecified: Secondary | ICD-10-CM | POA: Diagnosis not present

## 2022-11-28 DIAGNOSIS — R0789 Other chest pain: Secondary | ICD-10-CM | POA: Diagnosis not present

## 2022-11-28 DIAGNOSIS — F411 Generalized anxiety disorder: Secondary | ICD-10-CM | POA: Diagnosis not present

## 2022-11-28 DIAGNOSIS — R42 Dizziness and giddiness: Secondary | ICD-10-CM | POA: Diagnosis not present

## 2022-11-28 DIAGNOSIS — R0600 Dyspnea, unspecified: Secondary | ICD-10-CM | POA: Diagnosis not present

## 2022-11-28 DIAGNOSIS — R519 Headache, unspecified: Secondary | ICD-10-CM | POA: Diagnosis not present

## 2022-11-28 DIAGNOSIS — R0602 Shortness of breath: Secondary | ICD-10-CM | POA: Diagnosis not present

## 2022-11-28 DIAGNOSIS — R55 Syncope and collapse: Secondary | ICD-10-CM | POA: Diagnosis not present

## 2022-11-28 DIAGNOSIS — R0902 Hypoxemia: Secondary | ICD-10-CM | POA: Diagnosis not present

## 2022-11-28 DIAGNOSIS — R110 Nausea: Secondary | ICD-10-CM | POA: Diagnosis not present

## 2022-11-28 DIAGNOSIS — Z880 Allergy status to penicillin: Secondary | ICD-10-CM | POA: Diagnosis not present

## 2022-11-28 DIAGNOSIS — Z881 Allergy status to other antibiotic agents status: Secondary | ICD-10-CM | POA: Diagnosis not present

## 2022-11-28 DIAGNOSIS — R0689 Other abnormalities of breathing: Secondary | ICD-10-CM | POA: Diagnosis not present

## 2022-11-28 DIAGNOSIS — Z79899 Other long term (current) drug therapy: Secondary | ICD-10-CM | POA: Diagnosis not present

## 2022-12-03 ENCOUNTER — Ambulatory Visit: Payer: Self-pay

## 2022-12-03 NOTE — Telephone Encounter (Signed)
Message from [place] sent at 12/03/2022  3:06 PM EDT  Summary: Pt reports that her fasting blood sugar level was over 200   Pt reports that her fasting blood sugar level was over 200 and she would like to speak with a nurse. Pt requests call back. Cb# [phone]         Chief Complaint: BS >200 and BS <40 Symptoms: fatigue and sweaty hands now- h/o shaking and had syncopal episode in mental hospital  Frequency: had hypoglycemia during a [facility]pt [facility]  Pertinent Negatives: Patient denies  Disposition: [] ED /[] Urgent Care (no appt availability in office) / [x] Appointment(In office/virtual)/ []  [place] [facility]/ [] Home Care/ [] Refused Recommended Disposition /[] [place] [facility]/ []  Follow-up with PCP Additional Notes: pt has meter to check sugars at home- advised pt to check sugar when having sx (sweating, shakiness)  Reason for Disposition  Blood glucose 70-240 mg/dL (3.9 -09.8 mmol/L)  Answer Assessment - Initial Assessment Questions 1. BLOOD GLUCOSE: "What is your blood glucose level?"      208 before breakfast PC 169-   2. ONSET: "When did you check the blood glucose?"     This am  3. USUAL RANGE: "What is your glucose level usually?" (e.g., usual fasting morning value, usual evening value)     Fluctuates up and down - has a decrease in appetite 4. KETONES: "Do you check for ketones (urine or blood test strips)?" If Yes, ask: "What does the test show now?"      no 5. TYPE 1 or 2:  "Do you know what type of diabetes you have?"  (e.g., Type 1, Type 2, Gestational; doesn't know)      H/o hypoglycemia h/o gestational  82. INSULIN: "Do you take insulin?" "What type of insulin(s) do you use? What is the mode of delivery? (syringe, pen; injection or pump)?"      no 7. DIABETES PILLS: "Do you take any pills for your diabetes?" If Yes, ask: "Have you missed taking any pills recently?"     no 8. OTHER SYMPTOMS: "Do you have any symptoms?" (e.g., fever,  frequent urination, difficulty breathing, dizziness, weakness, vomiting)     Fatigue, shaking, sweaty hands - had syncopal episode in mental hospital  Protocols used: Diabetes - High Blood Sugar-A-AH

## 2022-12-05 DIAGNOSIS — F250 Schizoaffective disorder, bipolar type: Secondary | ICD-10-CM | POA: Diagnosis not present

## 2022-12-05 DIAGNOSIS — F411 Generalized anxiety disorder: Secondary | ICD-10-CM | POA: Diagnosis not present

## 2022-12-05 NOTE — Progress Notes (Signed)
   Acute Office Visit  Subjective:     Patient ID: Anna Flores, female    DOB: 04-30-1991, 30 y.o.   MRN: [id number]  Chief Complaint  Patient presents with   Blood sugars    HPI Patient is in today for concern about blood sugar. Patient has been checking fasting sugars for the last 2 to 3 weeks, they are ranging 125-140. Woke up in the morning the other day and had a fasting sugar of 208. Has been feeling very fatigued. Post-prandial sugars ranging 135-180. Patient endorses polydipsia and polyuria. Patient didn't eat all day yesterday was fasting sugar was in the 130's, ate a peanut butter sandwich was still in the 130's.  Does have a strong family history of diabetes.  Also has a personal history of gestational diabetes.  Denies any recent changes in medications or steroid use.  Review of Systems  Constitutional:  Positive for malaise/fatigue.  Endo/Heme/Allergies:  Positive for polydipsia.        Objective:    BP 124/80   Pulse (!) 101   Temp 98.4 F (36.9 C) (Oral)   Resp 16   Ht 5\' 5"  (1.651 m)   Wt 228 lb 14.4 oz (103.8 kg)   SpO2 99%   BMI 38.09 kg/m  BP Readings from Last 3 Encounters:  12/06/22 124/80  11/15/22 102/68  10/07/22 126/70   Wt Readings from Last 3 Encounters:  12/06/22 228 lb 14.4 oz (103.8 kg)  11/15/22 226 lb 9.6 oz (102.8 kg)  10/07/22 225 lb (102.1 kg)      Physical Exam Constitutional:      Appearance: Normal appearance.  HENT:     Head: Normocephalic and atraumatic.  Eyes:     Conjunctiva/sclera: Conjunctivae normal.  Cardiovascular:     Rate and Rhythm: Normal rate and regular rhythm.  Pulmonary:     Effort: Pulmonary effort is normal.     Breath sounds: Normal breath sounds.  Skin:    General: Skin is warm and dry.  Neurological:     General: No focal deficit present.     Mental Status: She is alert. Mental status is at baseline.  Psychiatric:        Mood and Affect: Mood normal.        Behavior: Behavior normal.     No  results found for any visits on 12/06/22.      Assessment & Plan:   1. Hyperglycemia/Polydipsia: Fasting blood sugars at home elevated, postprandials under 200.  A1c here in the office today 5.6.  Discussed insulin resistance and decreasing sugar and carbs in the diet for better glycemic control.  She does have a strong family history of diabetes and personal history of gestational diabetes.  Refill test strips and lancets so she can continue monitoring her blood sugar at home, will have her follow-up in 3 months to recheck another A1c.  Briefly discussed starting low-dose metformin, at this point lets plan to recheck an A1c prior to starting medication and work on diet change.  - POCT HgB A1C - glucose blood (ACCU-CHEK AVIVA PLUS) test strip; Use as instructed  Dispense: 100 each; Refill: 12 - Accu-Chek Softclix Lancets lancets; Use as instructed  Dispense: 100 each; Refill: 12   Return in about 3 months (around 03/08/2023) for CPE w/Pap .  Margarita Mail, DO

## 2022-12-06 ENCOUNTER — Other Ambulatory Visit: Payer: Self-pay | Admitting: Internal Medicine

## 2022-12-06 ENCOUNTER — Encounter: Payer: Self-pay | Admitting: Internal Medicine

## 2022-12-06 ENCOUNTER — Ambulatory Visit: Payer: Medicaid Other | Admitting: Internal Medicine

## 2022-12-06 VITALS — BP 124/80 | HR 101 | Temp 98.4°F | Resp 16 | Ht 65.0 in | Wt 228.9 lb

## 2022-12-06 DIAGNOSIS — R631 Polydipsia: Secondary | ICD-10-CM

## 2022-12-06 DIAGNOSIS — R739 Hyperglycemia, unspecified: Secondary | ICD-10-CM

## 2022-12-06 LAB — POCT GLYCOSYLATED HEMOGLOBIN (HGB A1C): Hemoglobin A1C: 5.6 % (ref 4.0–5.6)

## 2022-12-06 MED ORDER — ACCU-CHEK SOFTCLIX LANCETS MISC
12 refills | Status: DC
Start: 2022-12-06 — End: 2022-12-06

## 2022-12-06 MED ORDER — ACCU-CHEK AVIVA PLUS VI STRP
ORAL_STRIP | 12 refills | Status: DC
Start: 2022-12-06 — End: 2022-12-16

## 2022-12-06 NOTE — Telephone Encounter (Signed)
Requested medication (s) are due for refill today: no  Requested medication (s) are on the active medication list: yes  Last refill:  today  Future visit scheduled: yes  Notes to clinic:  pharmacy needing updated script with frequency, not just as instructed.     Requested Prescriptions  Pending Prescriptions Disp Refills   Accu-Chek Softclix Lancets lancets [Pharmacy Med Name: ACCU-CHEK SOFTCLIX LANCETS] 100 each 12    Sig: Use as instructed     Endocrinology: Diabetes - Testing Supplies Passed - 12/06/2022  8:33 AM      Passed - Valid encounter within last 12 months    Recent Outpatient Visits           Today Hyperglycemia   [facility] Margarita Mail, DO   3 weeks ago Right shoulder injury, initial encounter   [facility] Mecum, Oswaldo Conroy, PA-C   2 months ago Hyperglycemia   [facility] Margarita Mail, DO   8 months ago Hyperglycemia   [facility] Margarita Mail, DO   1 year ago Hordeolum externum of left upper eyelid   [facility] Mecum, Oswaldo Conroy, PA-C       Future Appointments             In 3 months Margarita Mail, DO [facility], [facility]

## 2022-12-06 NOTE — Patient Instructions (Signed)
A1c 5.6% today Continue to monitor sugars   Insulin Resistance  Insulin is a hormone that is made by the pancreas. Insulin allows blood sugar (glucose) to enter the cells in the body. Insulin helps the body use glucose for energy. Normally, the body is insulin sensitive, which means the cells in the body are effective at absorbing glucose. Insulin resistance is when the cells in the body do not respond properly to insulin and are not able to absorb glucose. The pancreas makes more insulin, but over time the body cannot make enough insulin to keep glucose at normal levels. Insulin resistance results in high blood glucose levels (hyperglycemia) and can lead to problems, including: Prediabetes. Type 2 diabetes (diabetes mellitus). Heart disease. High blood pressure (hypertension). Stroke. Polycystic ovary syndrome (PCOS). Nonalcoholic fatty liver disease. What are the causes? The exact cause of insulin resistance is not known. What increases the risk? The following factors may make you more likely to develop insulin resistance: Being overweight or obese, especially if a lot of your weight is in your waist area. Having an inactive (sedentary) lifestyle. Having above-normal glucose levels. Having abnormal cholesterol levels. Having sleep apnea. Being older than age 52. Using steroids. What are the signs or symptoms? This condition usually does not cause symptoms. A waist measurement of more than 35 inches (88.9 cm) for women and more than 40 inches (101.6 cm) for men may be a sign of insulin resistance. How is this diagnosed? There is no test to diagnose insulin resistance. However, your health care provider may diagnose insulin resistance based on: A physical exam. Your medical history. Blood tests that check your blood glucose level. How is this treated? Insulin resistance is treated with nutrition and lifestyle changes. These changes may include: Eating a healthy balance of nutritious  foods. Getting more physical activity. Maintaining a healthy weight. Stopping the use of any tobacco products. Your health care provider will work with you to change your nutrition and lifestyle as needed. In some cases, treatment may also include medicine to improve your insulin sensitivity. Follow these instructions at home: Activity Be physically active. Do moderate-intensity physical activity for at least 30 minutes on 5 or more days of the week, or as told by your health care provider. This could include brisk walking, biking, or water aerobics. Ask your health care provider what activities are safe for you. A mix of physical activities may be best, such as walking, swimming, biking, and strength training. Eating and drinking  Follow a healthy meal plan. This includes eating: Lean proteins. Complex carbohydrates. Examples of these include whole grains, starchy vegetables (potatoes, corn, peas), and beans. Fresh fruits and vegetables. Low-fat dairy products. Healthy fats. Follow instructions from your health care provider about eating or drinking restrictions. Make an appointment to see a diet and nutrition specialist (registered dietitian) to help you create a healthy eating plan. General instructions Check your blood glucose levels as told by your health care provider. Take over-the-counter and prescription medicines only as told by your health care provider. Lose weight as told by your health care provider. Losing 5-7% of your body weight can reverse insulin resistance. Your health care provider can determine how much weight loss is best for you and can help you lose weight safely. Do not use any products that contain nicotine or tobacco. These products include cigarettes, chewing tobacco, and vaping devices, such as e-cigarettes. If you need help quitting, ask your health care provider. Keep all follow-up visits. This is important. Contact  a health care provider if: You have  trouble losing weight or maintaining your goal weight. You gain weight. You have trouble following your prescribed meal plan. You have trouble exercising more. Summary Insulin resistance occurs when cells in the body do not respond properly to insulin and are not able to absorb blood sugar (glucose). The body makes more insulin, but over time the body cannot make enough insulin to keep blood sugar at normal levels. Insulin resistance is treated with nutrition and lifestyle changes, including eating a healthy balance of nutritious foods, getting more physical activity, and maintaining a healthy weight. Your health care provider will work with you to change your nutrition and lifestyle as needed. Treatment may also include medicine to improve your insulin sensitivity. Check your blood glucose levels as told by your health care provider. Keep all follow-up visits. This is important. This information is not intended to replace advice given to you by your health care provider. Make sure you discuss any questions you have with your health care provider. Document Revised: 04/10/2020 Document Reviewed: 04/10/2020 Elsevier Patient Education  2023 [employer].

## 2022-12-12 ENCOUNTER — Encounter: Payer: Self-pay | Admitting: Internal Medicine

## 2022-12-16 ENCOUNTER — Other Ambulatory Visit: Payer: Self-pay

## 2022-12-16 DIAGNOSIS — R631 Polydipsia: Secondary | ICD-10-CM

## 2022-12-16 DIAGNOSIS — R739 Hyperglycemia, unspecified: Secondary | ICD-10-CM

## 2022-12-16 MED ORDER — ACCU-CHEK AVIVA PLUS VI STRP
ORAL_STRIP | 12 refills | Status: DC
Start: 2022-12-16 — End: 2023-06-09

## 2022-12-19 DIAGNOSIS — F411 Generalized anxiety disorder: Secondary | ICD-10-CM | POA: Diagnosis not present

## 2022-12-19 DIAGNOSIS — F250 Schizoaffective disorder, bipolar type: Secondary | ICD-10-CM | POA: Diagnosis not present

## 2023-02-16 DIAGNOSIS — Z881 Allergy status to other antibiotic agents status: Secondary | ICD-10-CM | POA: Diagnosis not present

## 2023-02-16 DIAGNOSIS — M25562 Pain in left knee: Secondary | ICD-10-CM | POA: Diagnosis not present

## 2023-02-16 DIAGNOSIS — S8002XA Contusion of left knee, initial encounter: Secondary | ICD-10-CM | POA: Diagnosis not present

## 2023-02-16 DIAGNOSIS — M79672 Pain in left foot: Secondary | ICD-10-CM | POA: Diagnosis not present

## 2023-02-16 DIAGNOSIS — Z888 Allergy status to other drugs, medicaments and biological substances status: Secondary | ICD-10-CM | POA: Diagnosis not present

## 2023-02-16 DIAGNOSIS — S93402A Sprain of unspecified ligament of left ankle, initial encounter: Secondary | ICD-10-CM | POA: Diagnosis not present

## 2023-02-16 DIAGNOSIS — Z880 Allergy status to penicillin: Secondary | ICD-10-CM | POA: Diagnosis not present

## 2023-02-16 DIAGNOSIS — J302 Other seasonal allergic rhinitis: Secondary | ICD-10-CM | POA: Diagnosis not present

## 2023-02-16 DIAGNOSIS — M25572 Pain in left ankle and joints of left foot: Secondary | ICD-10-CM | POA: Diagnosis not present

## 2023-02-16 DIAGNOSIS — S99912A Unspecified injury of left ankle, initial encounter: Secondary | ICD-10-CM | POA: Diagnosis not present

## 2023-02-16 DIAGNOSIS — S93602A Unspecified sprain of left foot, initial encounter: Secondary | ICD-10-CM | POA: Diagnosis not present

## 2023-03-06 DIAGNOSIS — F411 Generalized anxiety disorder: Secondary | ICD-10-CM | POA: Diagnosis not present

## 2023-03-06 DIAGNOSIS — F250 Schizoaffective disorder, bipolar type: Secondary | ICD-10-CM | POA: Diagnosis not present
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
## 2023-03-11 ENCOUNTER — Encounter: Payer: Medicaid Other | Admitting: Internal Medicine

## 2023-03-18 ENCOUNTER — Ambulatory Visit: Payer: Self-pay | Admitting: *Deleted

## 2023-03-18 NOTE — Telephone Encounter (Signed)
  Chief Complaint: left foot pain  Symptoms: fell 02/16/23 twisted foot hitting left hip . Went to ED. Pain in left side of foot and pinky toe and toe beside it swollen. Difficulty bearing weight on foot without wearing "boot" for support. Ibuprofen not managing pain.  Frequency: 02/16/23 Pertinent Negatives: Patient denies fever no redness or discoloration of foot  Disposition: [] ED /[] Urgent Care (no appt availability in office) / [x] Appointment(In office/virtual)/ []  [place] [facility]/ [] Home Care/ [] Refused Recommended Disposition /[] [place] [facility]/ []  Follow-up with PCP Additional Notes:   Appt scheduled for tomorrow. Recommended if sx worsen go to ED.      Reason for Disposition  [1] SEVERE pain (e.g., excruciating, unable to do any normal activities) AND [2] not improved after 2 hours of pain medicine  Answer Assessment - Initial Assessment Questions 1. ONSET: "When did the pain start?"      02/16/23 2. LOCATION: "Where is the pain located?"      Left foot side outside  3. PAIN: "How bad is the pain?"    (Scale 1-10; or mild, moderate, severe)  - MILD (1-3): doesn't interfere with normal activities.   - MODERATE (4-7): interferes with normal activities (e.g., work or school) or awakens from sleep, limping.   - SEVERE (8-10): excruciating pain, unable to do any normal activities, unable to walk.      Left foot difficulty walking on foot unable to twist foot no weight bearing without "boot"  4. WORK OR EXERCISE: "Has there been any recent work or exercise that involved this part of the body?"      Fell 02/16/23 5. CAUSE: "What do you think is causing the foot pain?"     Fell  6. OTHER SYMPTOMS: "Do you have any other symptoms?" (e.g., leg pain, rash, fever, numbness)     Pinky toe swollen and toe beside it swollen severe pain walking on foot. Ibuprofen not helping for pain 7. PREGNANCY: "Is there any chance you are pregnant?" "When was your last menstrual period?"      na  Protocols used: Foot Pain-A-AH

## 2023-03-19 ENCOUNTER — Ambulatory Visit: Payer: Medicaid Other | Admitting: Internal Medicine

## 2023-03-19 ENCOUNTER — Encounter: Payer: Self-pay | Admitting: Internal Medicine

## 2023-03-19 ENCOUNTER — Other Ambulatory Visit: Payer: Self-pay | Admitting: Internal Medicine

## 2023-03-19 VITALS — BP 130/72 | HR 90 | Temp 98.2°F | Resp 18 | Ht 65.0 in | Wt 229.0 lb

## 2023-03-19 DIAGNOSIS — M79672 Pain in left foot: Secondary | ICD-10-CM

## 2023-03-19 DIAGNOSIS — S92352A Displaced fracture of fifth metatarsal bone, left foot, initial encounter for closed fracture: Secondary | ICD-10-CM | POA: Diagnosis not present

## 2023-03-19 NOTE — Progress Notes (Signed)
   Acute Office Visit  Subjective:     Patient ID: Anna Flores, female    DOB: [birth year], 32 y.o.   MRN: [id number]  Chief Complaint  Patient presents with   Foot Pain    Left, normal xrays hurting worse    HPI Patient is in today for left foot pain. Slipped and twisted her ankel and foot externally in July. Went to Urgent Care, x-rays normal but I am unable to view the films. Pain is mid fifth digit to the distal end. Patient is concerned because after a month she cannot bear weight on the foot and is still having severe swelling and pain. Walking with a boot but not improving pain, taking Ibuprofen and Tylenol.   ROS      Objective:    BP 130/72   Pulse 90   Temp 98.2 F (36.8 C)   Resp 18   Ht 5\' 5"  (1.651 m)   Wt 229 lb (103.9 kg)   LMP 03/12/2023   SpO2 100%   BMI 38.11 kg/m    Physical Exam  No results found for any visits on 03/19/23.      Assessment & Plan:   Concern for undetected fracture, will send over to [facility] urgent care for immediate evaluation.   Margarita Mail, DO

## 2023-04-02 ENCOUNTER — Ambulatory Visit: Payer: Self-pay | Admitting: *Deleted

## 2023-04-02 NOTE — Telephone Encounter (Signed)
Pt notified, Dr. Caralee Ates does not give pain meds.  May need to contact ortho for sooner appt

## 2023-04-02 NOTE — Telephone Encounter (Signed)
  Chief Complaint: Foot Pain Symptoms: Left foot pain. States displaced Fx per [facility] Frequency: 02/16/23 Pertinent Negatives: Patient denies redness warmth, slight swelling Disposition: [] ED /[] Urgent Care (no appt availability in office) / [] Appointment(In office/virtual)/ []  [place] [facility]/ [] Home Care/ [] Refused Recommended Disposition /[] [place] [facility]/ [x]  Follow-up with PCP Additional Notes:  Pt reports went to [facility] after seeing Dr. Caralee Ates as referred by her. States told displaced fx. Pt reports sever pain, "Cannot turn foot outward to left, very severe pain. Has "Post-op boot" States pain worse when off at HS. States she called [facility] today to ask about pain medication. Questioning if PCP would prescribe, also is referral needed. Please advise.  Has not heard back from [facility].   Reason for Disposition  [1] Caller requesting NON-URGENT health information AND [2] PCP's office is the best resource  Answer Assessment - Initial Assessment Questions 1. REASON FOR CALL or QUESTION: "What is your reason for calling today?" or "How can I best help you?" or "What question do you have that I can help answer?"     Pain management.  Protocols used: Information Only Call - No Triage-A-AH

## 2023-04-03 ENCOUNTER — Other Ambulatory Visit: Payer: Self-pay | Admitting: Internal Medicine

## 2023-04-03 ENCOUNTER — Telehealth: Payer: Self-pay | Admitting: Internal Medicine

## 2023-04-03 DIAGNOSIS — M79672 Pain in left foot: Secondary | ICD-10-CM

## 2023-04-03 NOTE — Telephone Encounter (Signed)
Patient called and stated that her orthopedic referral needs to be changed over to a Podiatry referral as patient stated the orthopedic office does not treat her diagnoses for foot. Please advise.  Patients callback # [phone]

## 2023-04-04 DIAGNOSIS — M79672 Pain in left foot: Secondary | ICD-10-CM | POA: Diagnosis not present
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
## 2023-05-06 DIAGNOSIS — F411 Generalized anxiety disorder: Secondary | ICD-10-CM | POA: Diagnosis not present

## 2023-05-06 DIAGNOSIS — F250 Schizoaffective disorder, bipolar type: Secondary | ICD-10-CM | POA: Diagnosis not present

## 2023-05-07 ENCOUNTER — Encounter: Payer: Self-pay | Admitting: Internal Medicine

## 2023-05-09 ENCOUNTER — Encounter: Payer: Medicaid Other | Admitting: Internal Medicine

## 2023-05-09 DIAGNOSIS — Z124 Encounter for screening for malignant neoplasm of cervix: Secondary | ICD-10-CM

## 2023-05-14 ENCOUNTER — Encounter: Payer: Self-pay | Admitting: Physician Assistant

## 2023-05-14 ENCOUNTER — Telehealth: Payer: Self-pay | Admitting: Internal Medicine

## 2023-05-14 ENCOUNTER — Ambulatory Visit: Payer: Medicaid Other | Admitting: Physician Assistant

## 2023-05-14 VITALS — BP 108/72 | HR 84 | Temp 98.4°F | Resp 16 | Ht 65.0 in | Wt 220.0 lb

## 2023-05-14 DIAGNOSIS — R55 Syncope and collapse: Secondary | ICD-10-CM | POA: Diagnosis not present

## 2023-05-14 DIAGNOSIS — E162 Hypoglycemia, unspecified: Secondary | ICD-10-CM | POA: Insufficient documentation

## 2023-05-14 MED ORDER — DEXCOM G7 SENSOR MISC
1.0000 | 2 refills | Status: DC
Start: 2023-05-14 — End: 2023-09-25

## 2023-05-14 MED ORDER — GVOKE HYPOPEN 2-PACK 1 MG/0.2ML ~~LOC~~ SOAJ
1.0000 mg | SUBCUTANEOUS | 1 refills | Status: DC | PRN
Start: 2023-05-14 — End: 2023-05-15

## 2023-05-14 NOTE — Progress Notes (Signed)
Acute Office Visit   Patient: Anna Flores   DOB: October 13, 1990   32 y.o. Female  MRN: [id number] Visit Date: 05/14/2023  Today's healthcare provider: Oswaldo Conroy Sergey Ishler, PA-C  Introduced myself to the patient as a Secondary school teacher and provided education on APPs in clinical practice.    Chief Complaint  Patient presents with   Dizziness   Shaking    Sx appeared along with low sugars   Hyperglycemia    Blood sugars are low and has fainted. Last night BS was at 89   Subjective    HPI HPI     Shaking    Additional comments: Sx appeared along with low sugars        Hyperglycemia    Additional comments: Blood sugars are low and has fainted. Last night BS was at 89      Last edited by Forde Radon, CMA on 05/14/2023  8:37 AM.        Concern for Hypoglycemic events   She reports her blood glucose has been dropping a lot over the past 2 weeks She states she was on a mental health retreat and passed out 3 times over the course of the weekend, her glucose levels were in 60s when she checked them. A friend on the retreat gave her glucose tablets and she ate some chocolate but it took about 45  minutes to get glucose up to 100  She states the presyncope events, dizziness and fainting have happened at various times throughout the day  She reports she has been feeling dizzy and spacey when she wakes up- like she has had her head upside down a lot  She denies changes to her diet recently  Diet: She reports normal dietary intake, she is not cutting sugar or carbs. She usually skips breakfast but eats 2 meals per day, she does not snack throughout the day.  She has given up soda (diet sodas)  Water intake: 3-4 bottles (16 oz ) per day  She has had 9 lbs weight loss since Aug without trying to lose weight Exercise: she has not been exercising but is active with her children  She reports she has been having a lot of headaches  She denies chance for pregnancy- tubal removal in 2022     Medications: Outpatient Medications Prior to Visit  Medication Sig   Accu-Chek Softclix Lancets lancets USE AS INSTRUCTED   ARIPiprazole (ABILIFY) 30 MG tablet Take 30 mg by mouth daily.   busPIRone (BUSPAR) 10 MG tablet Take 10 mg by mouth 3 (three) times daily.   clonazePAM (KLONOPIN) 2 MG tablet Take 2 mg by mouth daily as needed.   gabapentin (NEURONTIN) 300 MG capsule Take 100 mg by mouth 3 (three) times daily as needed.   glucose blood (ACCU-CHEK AVIVA PLUS) test strip Check 3 times daily   lamoTRIgine (LAMICTAL) 200 MG tablet Take 200 mg by mouth at bedtime.   OLANZapine zydis (ZYPREXA) 5 MG disintegrating tablet Take 5 mg by mouth daily.   traZODone (DESYREL) 100 MG tablet Take 150 mg by mouth at bedtime as needed.   ALPRAZolam (XANAX) 0.5 MG tablet Take 0.25 mg by mouth 2 (two) times daily as needed for sleep or anxiety. (Patient not taking: Reported on 05/14/2023)   meloxicam (MOBIC) 15 MG tablet Take 1 tablet (15 mg total) by mouth daily. (Patient not taking: Reported on 05/14/2023)   tiZANidine (ZANAFLEX) 4 MG tablet Take 1 tablet (4 mg  total) by mouth every 6 (six) hours as needed for muscle spasms. (Patient not taking: Reported on 05/14/2023)   [DISCONTINUED] ARIPiprazole (ABILIFY) 20 MG tablet Take 20 mg by mouth daily. (Patient not taking: Reported on 05/14/2023)   No facility-administered medications prior to visit.    Review of Systems  Neurological:  Positive for dizziness, syncope, weakness and light-headedness.        Objective    BP 108/72   Pulse 84   Temp 98.4 F (36.9 C) (Oral)   Resp 16   Ht 5\' 5"  (1.651 m)   Wt 220 lb (99.8 kg)   SpO2 100%   BMI 36.61 kg/m     Physical Exam Vitals reviewed.  Constitutional:      General: She is awake.     Appearance: Normal appearance. She is well-developed and well-groomed.  HENT:     Head: Normocephalic and atraumatic.  Cardiovascular:     Rate and Rhythm: Normal rate and regular rhythm.     Pulses:  Normal pulses.     Heart sounds: Normal heart sounds. No murmur heard.    No friction rub. No gallop.  Musculoskeletal:     Cervical back: Normal range of motion.     Right lower leg: No edema.     Left lower leg: No edema.  Skin:    General: Skin is warm and dry.  Neurological:     General: No focal deficit present.     Mental Status: She is alert and oriented to person, place, and time. Mental status is at baseline.  Psychiatric:        Mood and Affect: Mood normal.        Behavior: Behavior normal. Behavior is cooperative.        Thought Content: Thought content normal.        Judgment: Judgment normal.       No results found for any visits on 05/14/23.  Assessment & Plan      Return in about 2 weeks (around 05/28/2023) for Hypoglycemia, syncope.        Problem List Items Addressed This Visit       Endocrine   Hypoglycemia - Primary    Acute, new concern, recurrent and episodic Patient reports multiple episodes of hypoglycemia over the last 2 weeks.  She states that on several occasions she has lost consciousness and her blood sugar levels during those episodes were in the 60s She has been managing at home with glucose tablets, candy to help raise blood sugar levels. We reviewed her diet.  She reports that she is eating about 2 meals per day but is not restricting carbs or glucose.  She has given up drinking diet sodas and is drinking 3 to 416 ounce bottles of water per day. Will check CMP, CBC, A1c, insulin, C-peptide. Provided patient with Dexcom sensor for glucose monitoring as well as Gvoke hypopens for more rapid improvement in hypoglycemia especially if she loses consciousness Recommend that she does not drive until we have this resolved given how quickly her syncopal events have been occurring Results of above lab testing to dictate further management Follow-up in about 2 weeks to assess progress and discuss further management      Relevant Medications    Continuous Glucose Sensor (DEXCOM G7 SENSOR) MISC   GVOKE HYPOPEN 2-PACK 1 MG/0.2ML SOAJ   Other Relevant Orders   COMPLETE METABOLIC PANEL WITH GFR   CBC w/Diff/Platelet   HgB A1c   Insulin, Free (  Bioactive)   C-peptide     Other   Syncope    Acute, recurrent Suspect syncopal events are likely secondary to hypoglycemia as she reports glucose levels in the low 60s on multiple occasions Reviewed dietary changes (eating 3-4 meals per day and snacking several times throughout the day to prevent lows, having high sugar foods and drinks on hand in the event that she starts developing hypoglycemia. Provided Dexcom sensor for glucose monitoring and will run lab work for further rule out If she does not experience hypoglycemic syncope again recommend that she calls emergency services so that they can provide stabilization Follow-up in 2 weeks      Relevant Medications   Continuous Glucose Sensor (DEXCOM G7 SENSOR) MISC   GVOKE HYPOPEN 2-PACK 1 MG/0.2ML SOAJ   Other Relevant Orders   COMPLETE METABOLIC PANEL WITH GFR   CBC w/Diff/Platelet   HgB A1c   Insulin, Free (Bioactive)   C-peptide     Return in about 2 weeks (around 05/28/2023) for Hypoglycemia, syncope.   I, Leisa Gault E Densil Ottey, PA-C, have reviewed all documentation for this visit. The documentation on 05/14/23 for the exam, diagnosis, procedures, and orders are all accurate and complete.   Jacquelin Hawking, MHS, PA-C [facility]

## 2023-05-14 NOTE — Telephone Encounter (Signed)
Pt is calling in because she was given a script for a Dexcom Monitor and she says her pharmacy is requiring a prior authorization before they can fill it.

## 2023-05-14 NOTE — Assessment & Plan Note (Signed)
Acute, recurrent Suspect syncopal events are likely secondary to hypoglycemia as she reports glucose levels in the low 60s on multiple occasions Reviewed dietary changes (eating 3-4 meals per day and snacking several times throughout the day to prevent lows, having high sugar foods and drinks on hand in the event that she starts developing hypoglycemia. Provided Dexcom sensor for glucose monitoring and will run lab work for further rule out If she does not experience hypoglycemic syncope again recommend that she calls emergency services so that they can provide stabilization Follow-up in 2 weeks

## 2023-05-14 NOTE — Telephone Encounter (Signed)
Copied from CRM [id number]. Topic: General - Other >> May 14, 2023  3:49 PM Anna Flores wrote: Reason for CRM: The patient has been instructed by their pharmacy to contact their PCP and request a prior authorization for their Continuous Glucose Sensor (DEXCOM G7 SENSOR) MISC [[id number]] prescription   Please contact further when possible

## 2023-05-14 NOTE — Patient Instructions (Addendum)
  Please refrain from driving until we have figured out what is causing your fainting and dizziness For now I recommend eating 3 - 4 meals per day and snacking in between to make sure your glucose levels are staying up and  level  Make sure you keep orange juice or a full sugar soda and snacks with you to help if you start to feel like your glucose is dropping   I have sent in a script for a continuous glucose monitor- this should connect to your phone to provide easy and fast glucose data. It should also alert you to low sugar readings before you feel bad.  I have also sent in a script for glucose injections to use in an emergency if you faint again and are not able to ingest anything for a while. If this happens you should call emergency services for evaluation and stabilization   We will keep you updated on you labs once they come back

## 2023-05-14 NOTE — Telephone Encounter (Signed)
PA done through cover my meds, waiting on insurance determination.

## 2023-05-14 NOTE — Telephone Encounter (Signed)
Anna Flores (KeyRae Lips) - [id number] Dexcom G7 Sensor status: PA Response - DeniedCreated: October 9th, 2024 [phone]: October 9th, 2024 Open  Archive   Pt notified verbalized understanding, pt stated she is feeling shaky and her bs recently was 38 FYI. Per Denial PA form says you can do peer to peer if you do not agree with decision.

## 2023-05-14 NOTE — Assessment & Plan Note (Signed)
Acute, new concern, recurrent and episodic Patient reports multiple episodes of hypoglycemia over the last 2 weeks.  She states that on several occasions she has lost consciousness and her blood sugar levels during those episodes were in the 60s She has been managing at home with glucose tablets, candy to help raise blood sugar levels. We reviewed her diet.  She reports that she is eating about 2 meals per day but is not restricting carbs or glucose.  She has given up drinking diet sodas and is drinking 3 to 416 ounce bottles of water per day. Will check CMP, CBC, A1c, insulin, C-peptide. Provided patient with Dexcom sensor for glucose monitoring as well as Gvoke hypopens for more rapid improvement in hypoglycemia especially if she loses consciousness Recommend that she does not drive until we have this resolved given how quickly her syncopal events have been occurring Results of above lab testing to dictate further management Follow-up in about 2 weeks to assess progress and discuss further management

## 2023-05-15 ENCOUNTER — Other Ambulatory Visit: Payer: Self-pay | Admitting: Physician Assistant

## 2023-05-15 ENCOUNTER — Telehealth: Payer: Self-pay | Admitting: Internal Medicine

## 2023-05-15 ENCOUNTER — Other Ambulatory Visit: Payer: Self-pay

## 2023-05-15 ENCOUNTER — Ambulatory Visit: Payer: Self-pay | Admitting: *Deleted

## 2023-05-15 DIAGNOSIS — R55 Syncope and collapse: Secondary | ICD-10-CM

## 2023-05-15 DIAGNOSIS — E162 Hypoglycemia, unspecified: Secondary | ICD-10-CM | POA: Diagnosis present

## 2023-05-15 DIAGNOSIS — E11649 Type 2 diabetes mellitus with hypoglycemia without coma: Secondary | ICD-10-CM | POA: Diagnosis not present

## 2023-05-15 LAB — CBG MONITORING, ED: Glucose-Capillary: 108 mg/dL — ABNORMAL HIGH (ref 70–99)

## 2023-05-15 MED ORDER — GVOKE HYPOPEN 1-PACK 0.5 MG/0.1ML ~~LOC~~ SOAJ: 1.0000 mg | SUBCUTANEOUS | 1 refills | Status: DC | PRN

## 2023-05-15 NOTE — Telephone Encounter (Signed)
Copied from CRM [id number]. Topic: General - Other >> May 15, 2023  1:23 PM Franchot Heidelberg wrote: Reason for CRM: Pt called requesting for her PCP to appeal the denial of her recent dexcom order.

## 2023-05-15 NOTE — ED Triage Notes (Addendum)
Pt to ED via POV c/o hypoglycemia. Pt reports cbg levels have been dropping all day today. Has been feeling more tired than usual and has been having headaches, nausea. Pt CBG 85 ago. Lowest cbg at home was 73

## 2023-05-15 NOTE — ED Notes (Signed)
CBG 108. 
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
## 2023-05-15 NOTE — Telephone Encounter (Signed)
Dexcom has been denied

## 2023-05-16 ENCOUNTER — Emergency Department
Admission: EM | Admit: 2023-05-16 | Discharge: 2023-05-16 | Disposition: A | Discharge status: Home / Self Care | Payer: Medicaid Other | Attending: Emergency Medicine | Admitting: Emergency Medicine

## 2023-05-16 DIAGNOSIS — Z139 Encounter for screening, unspecified: Secondary | ICD-10-CM

## 2023-05-16 DIAGNOSIS — E162 Hypoglycemia, unspecified: Secondary | ICD-10-CM

## 2023-05-16 LAB — CBG MONITORING, ED: Glucose-Capillary: 105 mg/dL — ABNORMAL HIGH (ref 70–99)

## 2023-05-16 MED ORDER — ONDANSETRON 4 MG PO TBDP: 4.0000 mg | ORAL_TABLET | Freq: Three times a day (TID) | ORAL | 0 refills | Status: DC | PRN

## 2023-05-16 MED ORDER — KETOROLAC TROMETHAMINE 30 MG/ML IJ SOLN
30.0000 mg | Freq: Once | INTRAMUSCULAR | Status: AC
  Administered 2023-05-16: 30 mg via INTRAMUSCULAR
  Filled 2023-05-16: qty 1

## 2023-05-16 MED ORDER — ONDANSETRON 4 MG PO TBDP
4.0000 mg | ORAL_TABLET | Freq: Once | ORAL | Status: AC
  Administered 2023-05-16: 4 mg via ORAL
  Filled 2023-05-16: qty 1

## 2023-05-16 NOTE — Telephone Encounter (Signed)
Pt.notified

## 2023-05-16 NOTE — Telephone Encounter (Signed)
Pt picked up monitor today during lunch

## 2023-05-16 NOTE — ED Notes (Signed)
Pt provided drink  

## 2023-05-16 NOTE — ED Provider Notes (Signed)
[facility] Provider Note    Event Date/Time   First MD Initiated Contact with Patient 05/16/23 0124     (approximate)   History   Hypoglycemia   HPI  Anna Flores is a 32 y.o. female who presents to the ED for evaluation of Hypoglycemia   Review of PCP visit from 10/9.  Seen for the same, concerns for hypoglycemia.  Patient is not diabetic.  Subacute concerns for episodic shaking spells and dizziness while awake.  I review outpatient blood work that was sent for this including A1c, C-peptide, CBC and CMP that were all normal. Patient otherwise has a history of bipolar disorder and anxiety  Patient presents to the ED for evaluation of hypoglycemia.  She has a CGM in place and shows me her phone Dexcom readings where her glucose remains 70-110.  She reports feeling presyncopal and nauseous when her glucose levels get relatively low, 70s-80s.  No fevers, emesis or syncopal episodes.  Reports nausea that sometimes precludes glucose administration in this setting.  Physical Exam   Triage Vital Signs: ED Triage Vitals  Encounter Vitals Group     BP 05/15/23 2315 124/87     Systolic BP Percentile --      Diastolic BP Percentile --      Pulse Rate 05/15/23 2315 77     Resp 05/15/23 2315 18     Temp 05/15/23 2315 97.9 F (36.6 C)     Temp Source 05/15/23 2315 Oral     SpO2 05/15/23 2315 100 %     Weight 05/15/23 2312 220 lb (99.8 kg)     Height 05/15/23 2312 5\' 5"  (1.651 m)     Head Circumference --      Peak Flow --      Pain Score 05/15/23 2312 0     Pain Loc --      Pain Education --      Exclude from Growth Chart --     Most recent vital signs: Vitals:   05/15/23 2315  BP: 124/87  Pulse: 77  Resp: 18  Temp: 97.9 F (36.6 C)  SpO2: 100%    General: Awake, no distress.  Obese, well-appearing, anxious CV:  Good peripheral perfusion.  Resp:  Normal effort.  Abd:  No distention.  Soft and benign MSK:  No deformity noted.  Neuro:  No focal  deficits appreciated. Other:     ED Results / Procedures / Treatments   Labs (all labs ordered are listed, but only abnormal results are displayed) Labs Reviewed  CBG MONITORING, ED - Abnormal; Notable for the following components:      Result Value   Glucose-Capillary 108 (*)    All other components within normal limits  POC URINE PREG, ED    EKG   RADIOLOGY   Official radiology report(s): No results found.  PROCEDURES and INTERVENTIONS:  Procedures  Medications - No data to display   IMPRESSION / MDM / ASSESSMENT AND PLAN / ED COURSE  I reviewed the triage vital signs and the nursing notes.  Differential diagnosis includes, but is not limited to, poor intake, insulin, spinal urea or other medication intake/overdose, anxiety  {Patient presents with symptoms of an acute illness or injury that is potentially life-threatening.  Patient presents with concerns for hyperglycemia suitable for outpatient management.  Normal vitals and exam.  Looks well to me.  Multiple CBG rechecks are normal.  She tolerated p.o. and suitable for outpatient management with PCP follow-up.  Clinical Course as of 05/16/23 0252  Fri May 16, 2023  0250 Reassessed, feeling better.  CBG remains normal/high.  Discussed management at home and return precautions. [DS]    Clinical Course User Index [DS] Delton Prairie, MD     FINAL CLINICAL IMPRESSION(S) / ED DIAGNOSES   Final diagnoses:  None     Rx / DC Orders   ED Discharge Orders     None        Note:  This document was prepared using Dragon voice recognition software and may include unintentional dictation errors.   Delton Prairie, MD 05/16/23 [phone]

## 2023-05-16 NOTE — ED Notes (Signed)
Patient discharged at this time. Ambulated to lobby with independent and steady gait. Breathing unlabored speaking in full sentences. Verbalized understanding of all discharge, follow up, and medication teaching. Discharged homed with all belongings.   

## 2023-05-21 LAB — HEMOGLOBIN A1C
Hgb A1c MFr Bld: 5.4 %{Hb} (ref <5.7)
Mean Plasma Glucose: 108 mg/dL
eAG (mmol/L): 6 mmol/L

## 2023-05-21 LAB — CBC WITH DIFFERENTIAL/PLATELET
Absolute Lymphocytes: 2781 {cells}/uL (ref 850–3900)
Absolute Monocytes: 600 {cells}/uL (ref 200–950)
Basophils Absolute: 24 {cells}/uL (ref 0–200)
Basophils Relative: 0.3 %
Eosinophils Absolute: 79 {cells}/uL (ref 15–500)
Eosinophils Relative: 1 %
HCT: 42.9 % (ref 35.0–45.0)
Hemoglobin: 14.3 g/dL (ref 11.7–15.5)
MCH: 29.5 pg (ref 27.0–33.0)
MCHC: 33.3 g/dL (ref 32.0–36.0)
MCV: 88.6 fL (ref 80.0–100.0)
MPV: 9.9 fL (ref 7.5–12.5)
Monocytes Relative: 7.6 %
Neutro Abs: 4416 {cells}/uL (ref 1500–7800)
Neutrophils Relative %: 55.9 %
Platelets: 313 10*3/uL (ref 140–400)
RBC: 4.84 10*6/uL (ref 3.80–5.10)
RDW: 12.8 % (ref 11.0–15.0)
Total Lymphocyte: 35.2 %
WBC: 7.9 10*3/uL (ref 3.8–10.8)

## 2023-05-21 LAB — COMPLETE METABOLIC PANEL WITH GFR
AG Ratio: 1.9 (calc) (ref 1.0–2.5)
ALT: 30 U/L — ABNORMAL HIGH (ref 6–29)
AST: 20 U/L (ref 10–30)
Albumin: 5.1 g/dL (ref 3.6–5.1)
Alkaline phosphatase (APISO): 73 U/L (ref 31–125)
BUN: 8 mg/dL (ref 7–25)
CO2: 27 mmol/L (ref 20–32)
Calcium: 10.1 mg/dL (ref 8.6–10.2)
Chloride: 101 mmol/L (ref 98–110)
Creat: 0.89 mg/dL (ref 0.50–0.97)
Globulin: 2.7 g/dL (ref 1.9–3.7)
Glucose, Bld: 100 mg/dL — ABNORMAL HIGH (ref 65–99)
Potassium: 4.3 mmol/L (ref 3.5–5.3)
Sodium: 139 mmol/L (ref 135–146)
Total Bilirubin: 0.7 mg/dL (ref 0.2–1.2)
Total Protein: 7.8 g/dL (ref 6.1–8.1)
eGFR: 89 mL/min/{1.73_m2} (ref >=60)

## 2023-05-21 LAB — INSULIN, FREE (BIOACTIVE): Insulin, Free: 20.9 u[IU]/mL — ABNORMAL HIGH (ref 1.5–14.9)

## 2023-05-21 LAB — C-PEPTIDE: C-Peptide: 3.55 ng/mL (ref 0.80–3.85)

## 2023-05-21 NOTE — Progress Notes (Signed)
Your lab results are back Your glucose was actually elevated. Your electrolytes, liver and kidney function all appear to be in normal limits Your CBC was overall normal, no signs of anemia Your A1c was 5.4 which is in normal range Your other lab work was normal as well which does not seem to indicate an insulin problem at this time.  For now I recommend continuing with management plan as discussed at your appointment.  Please let us know if you have further questions or concerns

## 2023-05-23 ENCOUNTER — Telehealth: Payer: Self-pay | Admitting: Internal Medicine

## 2023-05-23 ENCOUNTER — Ambulatory Visit: Payer: Self-pay

## 2023-05-23 NOTE — Progress Notes (Signed)
Your free insulin was a bit high. If you are still having episodes of hypoglycemia despite following the management plan discussed at your apt we may need to pursue other testing. If you are still having episodes please schedule a follow up with [place] to discuss further.

## 2023-05-23 NOTE — Telephone Encounter (Signed)
ummary: dizziness/low sugar   Patient called stated her sugar dropped to 69 last night and she has an appt on 10/24 but she was not sure if she needed to be seen sooner. She is also has been experiencing the shakes, dizziness and weekness. Her numbers dropped from 113 to 69 within 5 mins. She was sitting the entire time so she is not sure why it dropped so low in that amount of time     Chief Complaint: Last night blood glucose dropped from 113 to 69 in 5 minutes. Felt woozy. Symptoms: Above Frequency: Last night Pertinent Negatives: Patient denies  Disposition: [] ED /[] Urgent Care (no appt availability in office) / [x] Appointment(In office/virtual)/ []  [place] [facility]/ [] Home Care/ [] Refused Recommended Disposition /[] [place] [facility]/ []  Follow-up with PCP Additional Notes: Pt. Agrees with appointment.  Reason for Disposition  [1] Blood glucose 70  mg/dL (3.9 mmol/L) or below OR symptomatic, now improved with Care Advice AND [2] cause unknown  Answer Assessment - Initial Assessment Questions 1. SYMPTOMS: "What symptoms are you concerned about?"     Felt like she was going to pass out 2. ONSET:  "When did the symptoms start?"     Last night 3. BLOOD GLUCOSE: "What is your blood glucose level?"      69 4. USUAL RANGE: "What is your blood glucose level usually?" (e.g., usual fasting morning value, usual evening value)     N/a 5. TYPE 1 or 2:  "Do you know what type of diabetes you have?"  (e.g., Type 1, Type 2, Gestational; doesn't know)      N/a 6. INSULIN: "Do you take insulin?" "What type of insulin(s) do you use? What is the mode of delivery? (syringe, pen; injection or pump) "When did you last give yourself an insulin dose?" (i.e., time or hours/minutes ago) "How much did you give?" (i.e., how many units)     No 7. DIABETES PILLS: "Do you take any pills for your diabetes?" If Yes, ask: "What is the name of the medicine(s) that you take for high blood sugar?"     No 8.  OTHER SYMPTOMS: "Do you have any symptoms?" (e.g., fever, frequent urination, difficulty breathing, vomiting)     No 9. LOW BLOOD GLUCOSE TREATMENT: "What have you done so far to treat the low blood glucose level?"     Drank a soda and it went up 10. FOOD: "When did you last eat or drink?"       N/a 11. ALONE: "Are you alone right now or is someone with you?"        No 12. PREGNANCY: "Is there any chance you are pregnant?" "When was your last menstrual period?"       No  Protocols used: Diabetes - Low Blood Sugar-A-AH

## 2023-05-26 ENCOUNTER — Encounter: Payer: Self-pay | Admitting: Nurse Practitioner

## 2023-05-26 ENCOUNTER — Ambulatory Visit: Payer: Medicaid Other | Admitting: Nurse Practitioner

## 2023-05-26 VITALS — BP 114/76 | HR 89 | Temp 97.8°F | Resp 16 | Ht 65.0 in | Wt 224.4 lb

## 2023-05-26 DIAGNOSIS — E162 Hypoglycemia, unspecified: Secondary | ICD-10-CM | POA: Diagnosis not present

## 2023-05-26 LAB — GLUCOSE, POCT (MANUAL RESULT ENTRY): POC Glucose: 104 mg/dL — AB (ref 70–99)

## 2023-05-26 NOTE — Progress Notes (Signed)
BP 114/76 (BP Location: Right Arm, Patient Position: Sitting, Cuff Size: Normal)   Pulse 89   Temp 97.8 F (36.6 C) (Oral)   Resp 16   Ht 5\' 5"  (1.651 m) Comment: per patient  Wt 224 lb 6.4 oz (101.8 kg)   LMP 05/13/2023 (Exact Date)   SpO2 100%   BMI 37.34 kg/m    Subjective:    Patient ID: Anna Flores, female    DOB: 1991/05/13, 32 y.o.   MRN: [id number]  HPI: Anna Flores is a 32 y.o. female  Chief Complaint  Patient presents with   Hypoglycemia   Hypoglycemia: patient reports that her blood sugar has been running low.  She has been seen for this in the past. Most recently on 05/15/2023 and had labs done. She was also seen in the er for same on 05/16/2023. She says that a few days ago her blood glucose dropped 64.  She reports that she ate around 6 pm, spaghetti.  She says her dexcom alarmed and it was down to 64 at 9:45 pm.  She reports that when this happens she gets dizzy and feels faint. She reports her hands get real shaky and sweaty.  Patient reports she has a glucometer to verify dexcom.  She denies any new prescriptions or over the counter medications.  Discussed eating a high protein diet and less carbohydrate diet.   Her glucose this am was 104.  She has a follow up appointment with her PCP Dr. Caralee Ates,  she is going to try high protein diet and follow up if no improvement.    Relevant past medical, surgical, family and social history reviewed and updated as indicated. Interim medical history since our last visit reviewed. Allergies and medications reviewed and updated.  Review of Systems  Ten systems reviewed and is negative except as mentioned in HPI       Objective:    BP 114/76 (BP Location: Right Arm, Patient Position: Sitting, Cuff Size: Normal)   Pulse 89   Temp 97.8 F (36.6 C) (Oral)   Resp 16   Ht 5\' 5"  (1.651 m) Comment: per patient  Wt 224 lb 6.4 oz (101.8 kg)   LMP 05/13/2023 (Exact Date)   SpO2 100%   BMI 37.34 kg/m   Wt Readings from Last 3  Encounters:  05/26/23 224 lb 6.4 oz (101.8 kg)  05/15/23 220 lb (99.8 kg)  05/14/23 220 lb (99.8 kg)    Physical Exam  Constitutional: Patient appears well-developed and well-nourished. Obese  No distress.  HEENT: head atraumatic, normocephalic, pupils equal and reactive to light, neck supple Cardiovascular: Normal rate, regular rhythm and normal heart sounds.  No murmur heard. No BLE edema. Pulmonary/Chest: Effort normal and breath sounds normal. No respiratory distress. Abdominal: Soft.  There is no tenderness. Psychiatric: Patient has a normal mood and affect. behavior is normal. Judgment and thought content normal.  Results for orders placed or performed in visit on 05/26/23  POCT Glucose (CBG)  Result Value Ref Range   POC Glucose 104 (A) 70 - 99 mg/dl      Assessment & Plan:   Problem List Items Addressed This Visit       Endocrine   Hypoglycemia - Primary    Patient continues to have hypoglycemic episodes.  Recommend increasing protein in diet.  Continue to monitor blood sugar.       Relevant Orders   POCT Glucose (CBG) (Completed)     Follow up plan: Return for follow up.  Has an appointment scheduled with Dr. Caralee Ates, she will keep appointment if no improvement of symptoms.

## 2023-05-26 NOTE — Assessment & Plan Note (Signed)
Patient continues to have hypoglycemic episodes.  Recommend increasing protein in diet.  Continue to monitor blood sugar.

## 2023-05-27 NOTE — Progress Notes (Deleted)
Established Patient Office Visit  Subjective:  Patient ID: Anna Flores, female    DOB: October 08, 1990  Age: 32 y.o. MRN: [id number]  CC:  No chief complaint on file.   HPI Anna Flores presents for follow up. Had been seen recently for hypoglycemia. POC glucose yesterday 104. Last A1c 5.4% 10/24. Insulin elevated, c-peptide normal.   Hx of Iron deficiency anemia - not currently on oral iron, couldn't tolerate. She had to have Venofer infusions during last pregnancy in 2021. Last CBC hgb 13.6, MCV normal. Periods are regular. History of tubal ligation. Not on contraception.   Bipolar/GAD: Currently on Abilify 20 mg, Lamictal 200 mg, Buspar 10 TID, Trazodone 50 mg PRN. Xanax as needed and at bedtime. Follows with Psychiatry. Was started on Gabapentin 100 mg TID for anxiety, feels like it is causing side effects.   History of gestational diabetes in third pregnancy - never on medication, just controlled with diet. No issues with last 2 pregnancies. Glucose on last BMP 2/23 normal. She has had a few episodes of hypoglycemia - feeling shaky, dizzy and diaphoretic. Will check sugar and the lowest its been is 73.   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 Does have to lift and roll patients, sometimes exacerbating back pain. -Taking Zanaflex PRN which is helping  Hypertriglyceridemia: -Not on any medication currently  Lipid Panel     Component Value Date/Time   CHOL 153 09/26/2021 1108   TRIG 224 (H) 09/26/2021 1108   HDL 35 (L) 09/26/2021 1108   CHOLHDL 4.4 09/26/2021 1108   LDLCALC 87 09/26/2021 1108   Health Maintenance: -Blood work due  -Pap will be due will schedule   Past Medical History:  Diagnosis Date   Abdominal pain 10/26/2013   Anemia 2013   Anxiety    Back pain affecting pregnancy in third trimester 02/28/2020   Bipolar disorder (HCC)    Depression    History of Papanicolaou smear of  cervix 10/02/2016   NEG   IDA (iron deficiency anemia) 02/29/2020   Indication for care in labor and delivery, antepartum 03/07/2020   Mastitis 01/19/2021   right   Menometrorrhagia 10/26/2013   PONV (postoperative nausea and vomiting)    Postpartum galactocele 02/01/2021   Pregnancy    DELIVERED 08/12/16 - [facility]    Past Surgical History:  Procedure Laterality Date   DIAGNOSTIC LAPAROSCOPY  2018   suspected ectopic pregnancy-[facility]   DILATION AND EVACUATION N/A 06/11/2021   Procedure: D & C;  Surgeon: Conard Novak, MD;  Location: [facility] ORS;  Service: Gynecology;  Laterality: N/A;   INCISION AND DRAINAGE ABSCESS Right 02/06/2021   Procedure: INCISION AND DRAINAGE ABSCESS;  Surgeon: Leafy Ro, MD;  Location: [facility] ORS;  Service: General;  Laterality: Right;   LAPAROSCOPY N/A 06/11/2021   Procedure: LAPAROSCOPY DIAGNOSTIC;  Surgeon: Conard Novak, MD;  Location: [facility] ORS;  Service: Gynecology;  Laterality: N/A;   TONSILLECTOMY     XI ROBOTIC ASSISTED SALPINGECTOMY Bilateral 07/24/2021   Procedure: XI ROBOTIC ASSISTED SALPINGECTOMY;  Surgeon: [name], MD;  Location: [facility] OR[facility];  Service: Gynecology;  Laterality: Bilateral;    Family History  Adopted: Yes  Problem Relation Age of Onset   Nevi Sister        DISPLASTIC NEVUS   Ovarian cancer Mother    Cirrhosis Father     Social History   Socioeconomic History   Marital status:  Married    Spouse name: [name]    Number of children: 5   Years of education: 3   Highest education level: Not on file  Occupational History   Occupation: HOMEMAKER  Tobacco Use   Smoking status: Never   Smokeless tobacco: Never  Vaping Use   Vaping status: Never Used  Substance and Sexual Activity   Alcohol use: Yes    Comment: occassional   Drug use: No   Sexual activity: Yes    Birth control/protection: Surgical    Comment: Vasectomy  Other Topics Concern   Not on file  Social History Narrative   Not on file   Social  Determinants of Health   Financial Resource Strain: Low Risk  (07/22/2018)   Overall Financial Resource Strain (CARDIA)    Difficulty of Paying Living Expenses: Not hard at all  Food Insecurity: No Food Insecurity (07/22/2018)   Hunger Vital Sign    Worried About Running Out of Food in the Last Year: Never true    Ran Out of Food in the Last Year: Never true  Transportation Needs: No Transportation Needs (07/22/2018)   PRAPARE - Administrator, Civil Service (Medical): No    Lack of Transportation (Non-Medical): No  Physical Activity: Insufficiently Active (07/22/2018)   Exercise Vital Sign    Days of Exercise per Week: 4 days    Minutes of Exercise per Session: 30 min  Stress: Stress Concern Present (07/22/2018)   [employer] of Occupational Health - Occupational Stress Questionnaire    Feeling of Stress : Rather much  Social Connections: Unknown (11/25/2022)   Received [facility]   Social Network    Social Network: Not on file  Intimate Partner Violence: Unknown (11/25/2022)   Received from [facility]   HITS    Physically Hurt: Not on file    Insult or Talk Down To: Not on file    Threaten Physical Harm: Not on file    Scream or Curse: Not on file    ROS Review of Systems  Constitutional:  Negative for chills and fever.  Respiratory:  Negative for shortness of breath.   Cardiovascular:  Negative for chest pain.  Gastrointestinal:  Negative for abdominal pain, nausea and vomiting.    Objective:   Today's Vitals: LMP 05/13/2023 (Exact Date)   Physical Exam Constitutional:      Appearance: Normal appearance.  HENT:     Head: Normocephalic and atraumatic.  Eyes:     Conjunctiva/sclera: Conjunctivae normal.  Cardiovascular:     Rate and Rhythm: Normal rate and regular rhythm.  Pulmonary:     Effort: Pulmonary effort is normal.     Breath sounds: Normal breath sounds.  Chest:  Breasts:    Right: Normal. No swelling, bleeding, inverted  nipple, mass, nipple discharge, skin change or tenderness.  Skin:    General: Skin is warm and dry.  Neurological:     General: No focal deficit present.     Mental Status: She is alert. Mental status is at baseline.  Psychiatric:        Mood and Affect: Mood normal.        Behavior: Behavior normal.     Assessment & Plan:   1. Hyperglycemia: Now with reactive hypoglycemia. When she returns for CPE we will check A1c, glucose. She does occasionally check sugars, will refill lancets and test strips. Discussed decreasing sugar and carbs and increasing protein in the diet to keep her blood sugar stable.   -  Accu-Chek Softclix Lancets lancets; Use as instructed  Dispense: 100 each; Refill: 12 - glucose blood (ACCU-CHEK GUIDE) test strip; Use as instructed  Dispense: 100 each; Refill: 12  2. Compression fracture of T12 vertebra with routine healing, subsequent encounter: Improved but pain flares with lifting/rolling patients at work. Muscle relaxer working well for her, will refill. Taking as needed.   - tiZANidine (ZANAFLEX) 4 MG tablet; Take 1 tablet (4 mg total) by mouth every 6 (six) hours as needed for muscle spasms.  Dispense: 30 tablet; Refill: 1  3. Bipolar 1 disorder (HCC): Following with Psychiatry, was started on Gabapentin 100 mg TID for anxiety but appears to be causing side effects, will discuss with Psychiatrist.   4. Other iron deficiency anemia: Stable, periods are now regular. Plan to recheck CBC with CPE.    Follow-up: No follow-ups on file.   Margarita Mail, DO

## 2023-05-29 ENCOUNTER — Ambulatory Visit: Payer: Medicaid Other | Admitting: Internal Medicine

## 2023-06-02 ENCOUNTER — Telehealth: Payer: Self-pay | Admitting: Internal Medicine

## 2023-06-02 NOTE — Telephone Encounter (Signed)
Pt called asking if there were any samples of the Dexcom G7 sensor.  If not she will need a refill.  Her insurance usually dont cover the Dexcom G7.  [facility]

## 2023-06-02 NOTE — Telephone Encounter (Signed)
Pt notified, sample placed up front

## 2023-06-05 ENCOUNTER — Other Ambulatory Visit: Payer: Self-pay

## 2023-06-05 ENCOUNTER — Encounter: Payer: Self-pay | Admitting: Internal Medicine

## 2023-06-05 DIAGNOSIS — E162 Hypoglycemia, unspecified: Secondary | ICD-10-CM

## 2023-06-05 MED ORDER — ACCU-CHEK AVIVA PLUS W/DEVICE KIT: 1.0000 | PACK | Freq: Every day | Status: DC

## 2023-06-09 ENCOUNTER — Other Ambulatory Visit: Payer: Self-pay

## 2023-06-09 MED ORDER — ACCU-CHEK GUIDE VI STRP: ORAL_STRIP | 12 refills | Status: DC

## 2023-06-11 ENCOUNTER — Other Ambulatory Visit: Payer: Self-pay | Admitting: Internal Medicine

## 2023-06-11 DIAGNOSIS — E161 Other hypoglycemia: Secondary | ICD-10-CM

## 2023-06-11 DIAGNOSIS — E162 Hypoglycemia, unspecified: Secondary | ICD-10-CM

## 2023-06-11 MED ORDER — ACCU-CHEK GUIDE VI STRP
ORAL_STRIP | 12 refills | Status: DC
Start: 2023-06-11 — End: 2023-09-25

## 2023-06-13 ENCOUNTER — Emergency Department
Admission: EM | Admit: 2023-06-13 | Discharge: 2023-06-13 | Disposition: A | Discharge status: Home / Self Care | Payer: Medicaid Other | Attending: Student in an Organized Health Care Education/Training Program | Admitting: Student in an Organized Health Care Education/Training Program

## 2023-06-13 ENCOUNTER — Ambulatory Visit: Payer: Self-pay | Admitting: *Deleted

## 2023-06-13 ENCOUNTER — Emergency Department: Payer: Medicaid Other

## 2023-06-13 ENCOUNTER — Other Ambulatory Visit: Payer: Self-pay

## 2023-06-13 ENCOUNTER — Encounter: Payer: Self-pay | Admitting: Emergency Medicine

## 2023-06-13 ENCOUNTER — Encounter: Payer: Self-pay | Admitting: Internal Medicine

## 2023-06-13 DIAGNOSIS — E162 Hypoglycemia, unspecified: Secondary | ICD-10-CM | POA: Insufficient documentation

## 2023-06-13 DIAGNOSIS — R051 Acute cough: Secondary | ICD-10-CM

## 2023-06-13 DIAGNOSIS — J40 Bronchitis, not specified as acute or chronic: Secondary | ICD-10-CM | POA: Diagnosis not present

## 2023-06-13 DIAGNOSIS — R059 Cough, unspecified: Secondary | ICD-10-CM | POA: Diagnosis not present

## 2023-06-13 DIAGNOSIS — R42 Dizziness and giddiness: Secondary | ICD-10-CM | POA: Diagnosis not present

## 2023-06-13 DIAGNOSIS — R1111 Vomiting without nausea: Secondary | ICD-10-CM | POA: Diagnosis not present

## 2023-06-13 DIAGNOSIS — R110 Nausea: Secondary | ICD-10-CM | POA: Diagnosis not present

## 2023-06-13 DIAGNOSIS — Z20822 Contact with and (suspected) exposure to covid-19: Secondary | ICD-10-CM | POA: Insufficient documentation

## 2023-06-13 LAB — CBC WITH DIFFERENTIAL/PLATELET
Abs Immature Granulocytes: 0.03 10*3/uL (ref 0.00–0.07)
Basophils Absolute: 0 10*3/uL (ref 0.0–0.1)
Basophils Relative: 0 %
Eosinophils Absolute: 0 10*3/uL (ref 0.0–0.5)
Eosinophils Relative: 0 %
HCT: 35.8 % — ABNORMAL LOW (ref 36.0–46.0)
Hemoglobin: 12.5 g/dL (ref 12.0–15.0)
Immature Granulocytes: 0 %
Lymphocytes Relative: 24 %
Lymphs Abs: 1.8 10*3/uL (ref 0.7–4.0)
MCH: 30.1 pg (ref 26.0–34.0)
MCHC: 34.9 g/dL (ref 30.0–36.0)
MCV: 86.3 fL (ref 80.0–100.0)
Monocytes Absolute: 0.5 10*3/uL (ref 0.1–1.0)
Monocytes Relative: 6 %
Neutro Abs: 5.2 10*3/uL (ref 1.7–7.7)
Neutrophils Relative %: 70 %
Platelets: 304 10*3/uL (ref 150–400)
RBC: 4.15 MIL/uL (ref 3.87–5.11)
RDW: 12.6 % (ref 11.5–15.5)
WBC: 7.5 10*3/uL (ref 4.0–10.5)
nRBC: 0 % (ref 0.0–0.2)

## 2023-06-13 LAB — COMPREHENSIVE METABOLIC PANEL
ALT: 27 U/L (ref 0–44)
AST: 25 U/L (ref 15–41)
Albumin: 4.4 g/dL (ref 3.5–5.0)
Alkaline Phosphatase: 64 U/L (ref 38–126)
Anion gap: 9 (ref 5–15)
BUN: 9 mg/dL (ref 6–20)
CO2: 24 mmol/L (ref 22–32)
Calcium: 9 mg/dL (ref 8.9–10.3)
Chloride: 104 mmol/L (ref 98–111)
Creatinine, Ser: 0.76 mg/dL (ref 0.44–1.00)
GFR, Estimated: >60 mL/min (ref >60)
Glucose, Bld: 115 mg/dL — ABNORMAL HIGH (ref 70–99)
Potassium: 3.4 mmol/L — ABNORMAL LOW (ref 3.5–5.1)
Sodium: 137 mmol/L (ref 135–145)
Total Bilirubin: 1 mg/dL (ref <1.2)
Total Protein: 7.6 g/dL (ref 6.5–8.1)

## 2023-06-13 LAB — SARS CORONAVIRUS 2 BY RT PCR: SARS Coronavirus 2 by RT PCR: NEGATIVE

## 2023-06-13 LAB — POC URINE PREG, ED: Preg Test, Ur: NEGATIVE

## 2023-06-13 LAB — CBG MONITORING, ED: Glucose-Capillary: 72 mg/dL (ref 70–99)

## 2023-06-13 MED ORDER — ALBUTEROL SULFATE HFA 108 (90 BASE) MCG/ACT IN AERS: 2.0000 | INHALATION_SPRAY | Freq: Four times a day (QID) | RESPIRATORY_TRACT | 2 refills | Status: DC | PRN

## 2023-06-13 MED ORDER — IPRATROPIUM-ALBUTEROL 0.5-2.5 (3) MG/3ML IN SOLN
3.0000 mL | Freq: Once | RESPIRATORY_TRACT | Status: AC
  Administered 2023-06-13: 3 mL via RESPIRATORY_TRACT
  Filled 2023-06-13: qty 3

## 2023-06-13 MED ORDER — CLONAZEPAM 0.5 MG PO TABS
1.0000 mg | ORAL_TABLET | Freq: Once | ORAL | Status: AC
  Administered 2023-06-13: 1 mg via ORAL
  Filled 2023-06-13: qty 2

## 2023-06-13 MED ORDER — AZITHROMYCIN 250 MG PO TABS: ORAL_TABLET | ORAL | 0 refills | Status: AC

## 2023-06-13 MED ORDER — ONDANSETRON 4 MG PO TBDP: 4.0000 mg | ORAL_TABLET | Freq: Three times a day (TID) | ORAL | 0 refills | Status: DC | PRN

## 2023-06-13 MED ORDER — ACETAMINOPHEN 325 MG PO TABS
650.0000 mg | ORAL_TABLET | Freq: Once | ORAL | Status: AC
  Administered 2023-06-13: 650 mg via ORAL
  Filled 2023-06-13: qty 2

## 2023-06-13 NOTE — ED Provider Notes (Signed)
[facility] Provider Note    Event Date/Time   First MD Initiated Contact with Patient 06/13/23 1107     (approximate)   History   Hypoglycemia and Dizziness (/)   HPI  Anna Flores is a 32 y.o. female who presents to the ER for evaluation of cough congestion feeling of anxiety jitteriness and concern for low blood sugars.  Patient is not on any sort of insulin or antihyperglycemic medication.  States that she does have 2 family members that are sick at home both with negative COVID test.  She denies any chest pain but has been feeling a rattle in her chest productive congested cough and wheezing.  Has been diagnosed with bronchitis before but denies any history of asthma.     Physical Exam   Triage Vital Signs: ED Triage Vitals [06/13/23 1110]  Encounter Vitals Group     BP 126/72     Systolic BP Percentile      Diastolic BP Percentile      Pulse Rate 77     Resp 16     Temp 99.9 F (37.7 C)     Temp Source Oral     SpO2 100 %     Weight 224 lb 3.3 oz (101.7 kg)     Height 5\' 5"  (1.651 m)     Head Circumference      Peak Flow      Pain Score 0     Pain Loc      Pain Education      Exclude from Growth Chart     Most recent vital signs: Vitals:   06/13/23 1110  BP: 126/72  Pulse: 77  Resp: 16  Temp: 99.9 F (37.7 C)  SpO2: 100%     Constitutional: Alert  Eyes: Conjunctivae are normal.  Head: Atraumatic. Nose: No congestion/rhinnorhea. Mouth/Throat: Mucous membranes are moist.   Neck: Painless ROM.  Cardiovascular:   Good peripheral circulation. Respiratory: Normal respiratory effort.  Speaking complete sentences.  There is coarse wheeze heard on the left upper lobes. Gastrointestinal: Soft and nontender.  Musculoskeletal:  no deformity Neurologic:  MAE spontaneously. No gross focal neurologic deficits are appreciated.  Skin:  Skin is warm, dry and intact. No rash noted. Psychiatric: Mood and affect are normal. Speech and  behavior are normal.    ED Results / Procedures / Treatments   Labs (all labs ordered are listed, but only abnormal results are displayed) Labs Reviewed  CBC WITH DIFFERENTIAL/PLATELET - Abnormal; Notable for the following components:      Result Value   HCT 35.8 (*)    All other components within normal limits  COMPREHENSIVE METABOLIC PANEL - Abnormal; Notable for the following components:   Potassium 3.4 (*)    Glucose, Bld 115 (*)    All other components within normal limits  SARS CORONAVIRUS 2 BY RT PCR  CBG MONITORING, ED  POC URINE PREG, ED     EKG  ED ECG REPORT I, Willy Eddy, the attending physician, personally viewed and interpreted this ECG.   Date: 06/13/2023  EKG Time: 11:45  Rate: 70  Rhythm: sinus  Axis: normal  Intervals: normal  ST&T Change:  nonspecific t wave abn    RADIOLOGY Please see ED Course for my review and interpretation.  I personally reviewed all radiographic images ordered to evaluate for the above acute complaints and reviewed radiology reports and findings.  These findings were personally discussed with the patient.  Please see  medical record for radiology report.    PROCEDURES:  Critical Care performed: No  Procedures   MEDICATIONS ORDERED IN ED: Medications  clonazePAM (KLONOPIN) tablet 1 mg (1 mg Oral Given 06/13/23 1232)  ipratropium-albuterol (DUONEB) 0.5-2.5 (3) MG/3ML nebulizer solution 3 mL (3 mLs Nebulization Given 06/13/23 1233)  acetaminophen (TYLENOL) tablet 650 mg (650 mg Oral Given 06/13/23 1313)     IMPRESSION / MDM / ASSESSMENT AND PLAN / ED COURSE  I reviewed the triage vital signs and the nursing notes.                              Differential diagnosis includes, but is not limited to, congestion, sepsis, bronchitis, asthma, COPD, hypoglycemia, electrolyte abnormality, anxiety  Patient presenting to the ER for evaluation of symptoms as described above.  Based on symptoms, risk factors and  considered above differential, this presenting complaint could reflect a potentially life-threatening illness therefore the patient will be placed on continuous pulse oximetry and telemetry for monitoring.  Laboratory evaluation will be sent to evaluate for the above complaints.  Chest x-ray on my review and interpretation without evidence of dense consolidation or effusion.  Based on exam we will give nebulizer treatment.  Have high suspicion for bronchitis and given recent sick contacts low-grade temperature will likely plan on treating with azithromycin given uptake in walking pneumonia if her viral panel is negative.  She is not hypoglycemic.  Blood work otherwise reassuring.  Does not clinically consistent with PE.  Patient states that she does feel very anxious will give dose of her Klonopin.     Clinical Course as of 06/13/23 1323  Fri Jun 13, 2023  1322 Patient reassessed.  Her work is reassuring.  Given her exam findings and presentation I will cover for bronchitis with Z-Pak.  Given her history of anxiety do not feel that prednisone Lynder Parents the risks at this time.  She is not hypoxic no respiratory distress.  Does have access to albuterol will give refill.  Is having some complaint of nausea but abdominal exam remains benign.  Her blood sugars have remained normal.  She does appear stable and appropriate for outpatient follow-up. [PR]    Clinical Course User Index [PR] Willy Eddy, MD     FINAL CLINICAL IMPRESSION(S) / ED DIAGNOSES   Final diagnoses:  Acute cough  Bronchitis     Rx / DC Orders   ED Discharge Orders          Ordered    azithromycin (ZITHROMAX Z-PAK) 250 MG tablet        06/13/23 1321    ondansetron (ZOFRAN-ODT) 4 MG disintegrating tablet  Every 8 hours PRN        06/13/23 1321    albuterol (VENTOLIN HFA) 108 (90 Base) MCG/ACT inhaler  Every 6 hours PRN        06/13/23 1321             Note:  This document was prepared using Dragon voice  recognition software and may include unintentional dictation errors.    Willy Eddy, MD 06/13/23 1323

## 2023-06-13 NOTE — ED Triage Notes (Signed)
Presents via EMS from home  States she feels dizzy  slight nausea  and low blood sugar  States her blood sugar 43 around midnight   Gave herself some glucagon  and b/s was 156
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
## 2023-06-23 NOTE — Progress Notes (Unsigned)
Established Patient Office Visit  Subjective:  Patient ID: Anna Flores, female    DOB: 02-03-1991  Age: 32 y.o. MRN: [id number]  CC:  No chief complaint on file.   HPI Finnegan Litzinger presents for follow up. Had been seen recently for hypoglycemia. POC glucose yesterday 104. Last A1c 5.4% 10/24. Insulin elevated, c-peptide normal.   Hx of Iron deficiency anemia - not currently on oral iron, couldn't tolerate. She had to have Venofer infusions during last pregnancy in 2021. Last CBC hgb 13.6, MCV normal. Periods are regular. History of tubal ligation. Not on contraception.   Bipolar/GAD: Currently on Abilify 20 mg, Lamictal 200 mg, Buspar 10 TID, Trazodone 50 mg PRN. Xanax as needed and at bedtime. Follows with Psychiatry. Was started on Gabapentin 100 mg TID for anxiety, feels like it is causing side effects.   History of gestational diabetes in third pregnancy - never on medication, just controlled with diet. No issues with last 2 pregnancies. Glucose on last BMP 2/23 normal. She has had a few episodes of hypoglycemia - feeling shaky, dizzy and diaphoretic. Will check sugar and the lowest its been is 73.   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 Does have to lift and roll patients, sometimes exacerbating back pain. -Taking Zanaflex PRN which is helping  Hypertriglyceridemia: -Not on any medication currently  Lipid Panel     Component Value Date/Time   CHOL 153 09/26/2021 1108   TRIG 224 (H) 09/26/2021 1108   HDL 35 (L) 09/26/2021 1108   CHOLHDL 4.4 09/26/2021 1108   LDLCALC 87 09/26/2021 1108   Health Maintenance: -Blood work due  -Pap will be due will schedule   Past Medical History:  Diagnosis Date   Abdominal pain 10/26/2013   Anemia 2013   Anxiety    Back pain affecting pregnancy in third trimester 02/28/2020   Bipolar disorder (HCC)    Depression    History of Papanicolaou smear of  cervix 10/02/2016   NEG   IDA (iron deficiency anemia) 02/29/2020   Indication for care in labor and delivery, antepartum 03/07/2020   Mastitis 01/19/2021   right   Menometrorrhagia 10/26/2013   PONV (postoperative nausea and vomiting)    Postpartum galactocele 02/01/2021   Pregnancy    DELIVERED 08/12/16 - [facility]    Past Surgical History:  Procedure Laterality Date   DIAGNOSTIC LAPAROSCOPY  2018   suspected ectopic pregnancy-[facility]   DILATION AND EVACUATION N/A 06/11/2021   Procedure: D & C;  Surgeon: Conard Novak, MD;  Location: [facility] ORS;  Service: Gynecology;  Laterality: N/A;   INCISION AND DRAINAGE ABSCESS Right 02/06/2021   Procedure: INCISION AND DRAINAGE ABSCESS;  Surgeon: Leafy Ro, MD;  Location: [facility];  Service: General;  Laterality: Right;   LAPAROSCOPY N/A 06/11/2021   Procedure: LAPAROSCOPY DIAGNOSTIC;  Surgeon: Conard Novak, MD;  Location: [facility];  Service: Gynecology;  Laterality: N/A;   TONSILLECTOMY     XI ROBOTIC ASSISTED SALPINGECTOMY Bilateral 07/24/2021   Procedure: XI ROBOTIC ASSISTED SALPINGECTOMY;  Surgeon: [name], MD;  Location: [facility];  Service: Gynecology;  Laterality: Bilateral;    Family History  Adopted: Yes  Problem Relation Age of Onset   Nevi Sister        DISPLASTIC NEVUS   Ovarian cancer Mother    Cirrhosis Father     Social History   Socioeconomic History   Marital status:  Married    Spouse name: [name]    Number of children: 5   Years of education: 3   Highest education level: Not on file  Occupational History   Occupation: HOMEMAKER  Tobacco Use   Smoking status: Never   Smokeless tobacco: Never  Vaping Use   Vaping status: Never Used  Substance and Sexual Activity   Alcohol use: Yes    Comment: occassional   Drug use: No   Sexual activity: Yes    Birth control/protection: Surgical    Comment: Vasectomy  Other Topics Concern   Not on file  Social History Narrative   Not on file   Social  Determinants of Health   Financial Resource Strain: Low Risk  (07/22/2018)   Overall Financial Resource Strain (CARDIA)    Difficulty of Paying Living Expenses: Not hard at all  Food Insecurity: No Food Insecurity (07/22/2018)   Hunger Vital Sign    Worried About Running Out of Food in the Last Year: Never true    Ran Out of Food in the Last Year: Never true  Transportation Needs: No Transportation Needs (07/22/2018)   PRAPARE - Administrator, Civil Service (Medical): No    Lack of Transportation (Non-Medical): No  Physical Activity: Insufficiently Active (07/22/2018)   Exercise Vital Sign    Days of Exercise per Week: 4 days    Minutes of Exercise per Session: 30 min  Stress: Stress Concern Present (07/22/2018)   [employer] of Occupational Health - Occupational Stress Questionnaire    Feeling of Stress : Rather much  Social Connections: Unknown (11/25/2022)   Received from [facility]   Social Network    Social Network: Not on file  Intimate Partner Violence: Unknown (11/25/2022)   [facility], [facility]   HITS    Physically Hurt: Not on file    Insult or Talk Down To: Not on file    Threaten Physical Harm: Not on file    Scream or Curse: Not on file    ROS Review of Systems  Constitutional:  Negative for chills and fever.  Respiratory:  Negative for shortness of breath.   Cardiovascular:  Negative for chest pain.  Gastrointestinal:  Negative for abdominal pain, nausea and vomiting.    Objective:   Today's Vitals: LMP 06/09/2023 (Exact Date)   Physical Exam Constitutional:      Appearance: Normal appearance.  HENT:     Head: Normocephalic and atraumatic.  Eyes:     Conjunctiva/sclera: Conjunctivae normal.  Cardiovascular:     Rate and Rhythm: Normal rate and regular rhythm.  Pulmonary:     Effort: Pulmonary effort is normal.     Breath sounds: Normal breath sounds.  Chest:  Breasts:    Right: Normal. No  swelling, bleeding, inverted nipple, mass, nipple discharge, skin change or tenderness.  Skin:    General: Skin is warm and dry.  Neurological:     General: No focal deficit present.     Mental Status: She is alert. Mental status is at baseline.  Psychiatric:        Mood and Affect: Mood normal.        Behavior: Behavior normal.     Assessment & Plan:   1. Hyperglycemia: Now with reactive hypoglycemia. When she returns for CPE we will check A1c, glucose. She does occasionally check sugars, will refill lancets and test strips. Discussed decreasing sugar and carbs and increasing protein in the diet to keep  her blood sugar stable.   - Accu-Chek Softclix Lancets lancets; Use as instructed  Dispense: 100 each; Refill: 12 - glucose blood (ACCU-CHEK GUIDE) test strip; Use as instructed  Dispense: 100 each; Refill: 12  2. Compression fracture of T12 vertebra with routine healing, subsequent encounter: Improved but pain flares with lifting/rolling patients at work. Muscle relaxer working well for her, will refill. Taking as needed.   - tiZANidine (ZANAFLEX) 4 MG tablet; Take 1 tablet (4 mg total) by mouth every 6 (six) hours as needed for muscle spasms.  Dispense: 30 tablet; Refill: 1  3. Bipolar 1 disorder (HCC): Following with Psychiatry, was started on Gabapentin 100 mg TID for anxiety but appears to be causing side effects, will discuss with Psychiatrist.   4. Other iron deficiency anemia: Stable, periods are now regular. Plan to recheck CBC with CPE.    Follow-up: No follow-ups on file.   Margarita Mail, DO

## 2023-06-25 ENCOUNTER — Ambulatory Visit: Payer: Medicaid Other | Admitting: Internal Medicine

## 2023-06-25 ENCOUNTER — Encounter: Payer: Self-pay | Admitting: Internal Medicine

## 2023-06-25 VITALS — BP 126/78 | HR 100 | Temp 98.0°F | Resp 18 | Ht 65.0 in | Wt 218.3 lb

## 2023-06-25 DIAGNOSIS — R42 Dizziness and giddiness: Secondary | ICD-10-CM

## 2023-06-25 DIAGNOSIS — R110 Nausea: Secondary | ICD-10-CM | POA: Diagnosis not present

## 2023-06-25 DIAGNOSIS — F331 Major depressive disorder, recurrent, moderate: Secondary | ICD-10-CM | POA: Diagnosis not present

## 2023-06-25 DIAGNOSIS — E161 Other hypoglycemia: Secondary | ICD-10-CM | POA: Diagnosis not present

## 2023-06-25 DIAGNOSIS — R051 Acute cough: Secondary | ICD-10-CM

## 2023-06-25 DIAGNOSIS — H8303 Labyrinthitis, bilateral: Secondary | ICD-10-CM

## 2023-06-25 MED ORDER — BENZONATATE 100 MG PO CAPS
100.0000 mg | ORAL_CAPSULE | Freq: Two times a day (BID) | ORAL | 0 refills | Status: DC | PRN
Start: 2023-06-25 — End: 2023-09-25

## 2023-06-25 MED ORDER — METHYLPREDNISOLONE 4 MG PO TBPK
ORAL_TABLET | ORAL | 0 refills | Status: DC
Start: 2023-06-25 — End: 2023-09-16

## 2023-06-25 MED ORDER — PROMETHAZINE HCL 12.5 MG PO TABS
12.5000 mg | ORAL_TABLET | Freq: Three times a day (TID) | ORAL | 0 refills | Status: DC | PRN
Start: 2023-06-25 — End: 2023-09-16

## 2023-07-03 LAB — PROINSULIN: Proinsulin: 7.8 pmol/L (ref <=18.8)

## 2023-07-03 LAB — BASIC METABOLIC PANEL
BUN: 7 mg/dL (ref 7–25)
CO2: 28 mmol/L (ref 20–32)
Calcium: 10 mg/dL (ref 8.6–10.2)
Chloride: 100 mmol/L (ref 98–110)
Creat: 0.95 mg/dL (ref 0.50–0.97)
Glucose, Bld: 100 mg/dL — ABNORMAL HIGH (ref 65–99)
Potassium: 4.5 mmol/L (ref 3.5–5.3)
Sodium: 139 mmol/L (ref 135–146)

## 2023-07-03 LAB — INSULIN, FREE (BIOACTIVE): Insulin, Free: 15.9 u[IU]/mL — ABNORMAL HIGH (ref 1.5–14.9)

## 2023-07-03 LAB — CBC WITH DIFFERENTIAL/PLATELET
Absolute Lymphocytes: 2489 {cells}/uL (ref 850–3900)
Absolute Monocytes: 500 {cells}/uL (ref 200–950)
Basophils Absolute: 29 {cells}/uL (ref 0–200)
Basophils Relative: 0.3 %
Eosinophils Absolute: 29 {cells}/uL (ref 15–500)
Eosinophils Relative: 0.3 %
HCT: 41.7 % (ref 35.0–45.0)
Hemoglobin: 13.9 g/dL (ref 11.7–15.5)
MCH: 29.6 pg (ref 27.0–33.0)
MCHC: 33.3 g/dL (ref 32.0–36.0)
MCV: 88.7 fL (ref 80.0–100.0)
MPV: 9.7 fL (ref 7.5–12.5)
Monocytes Relative: 5.1 %
Neutro Abs: 6752 {cells}/uL (ref 1500–7800)
Neutrophils Relative %: 68.9 %
Platelets: 337 10*3/uL (ref 140–400)
RBC: 4.7 10*6/uL (ref 3.80–5.10)
RDW: 12.6 % (ref 11.0–15.0)
Total Lymphocyte: 25.4 %
WBC: 9.8 10*3/uL (ref 3.8–10.8)

## 2023-07-03 LAB — C-PEPTIDE: C-Peptide: 3.73 ng/mL (ref 0.80–3.85)

## 2023-07-03 LAB — BETA-HYDROXYBUTYRATE: Beta-Hydroxybutyric Acid: 0.27 mmol/L

## 2023-07-17 DIAGNOSIS — F331 Major depressive disorder, recurrent, moderate: Secondary | ICD-10-CM | POA: Diagnosis not present

## 2023-07-17 DIAGNOSIS — Z3202 Encounter for pregnancy test, result negative: Secondary | ICD-10-CM | POA: Diagnosis not present

## 2023-07-17 DIAGNOSIS — M79632 Pain in left forearm: Secondary | ICD-10-CM | POA: Diagnosis not present

## 2023-07-17 DIAGNOSIS — M62838 Other muscle spasm: Secondary | ICD-10-CM | POA: Diagnosis not present

## 2023-07-17 DIAGNOSIS — W109XXA Fall (on) (from) unspecified stairs and steps, initial encounter: Secondary | ICD-10-CM | POA: Diagnosis not present

## 2023-07-22 DIAGNOSIS — F411 Generalized anxiety disorder: Secondary | ICD-10-CM | POA: Diagnosis not present

## 2023-07-22 DIAGNOSIS — F4312 Post-traumatic stress disorder, chronic: Secondary | ICD-10-CM | POA: Diagnosis not present

## 2023-07-22 DIAGNOSIS — F250 Schizoaffective disorder, bipolar type: Secondary | ICD-10-CM | POA: Diagnosis not present

## 2023-08-04 DIAGNOSIS — F331 Major depressive disorder, recurrent, moderate: Secondary | ICD-10-CM | POA: Diagnosis not present

## 2023-08-14 DIAGNOSIS — F331 Major depressive disorder, recurrent, moderate: Secondary | ICD-10-CM | POA: Diagnosis not present

## 2023-08-15 DIAGNOSIS — F331 Major depressive disorder, recurrent, moderate: Secondary | ICD-10-CM | POA: Diagnosis not present

## 2023-08-21 DIAGNOSIS — F331 Major depressive disorder, recurrent, moderate: Secondary | ICD-10-CM | POA: Diagnosis not present

## 2023-08-23 DIAGNOSIS — F331 Major depressive disorder, recurrent, moderate: Secondary | ICD-10-CM | POA: Diagnosis not present

## 2023-08-27 DIAGNOSIS — F331 Major depressive disorder, recurrent, moderate: Secondary | ICD-10-CM | POA: Diagnosis not present

## 2023-08-30 DIAGNOSIS — F331 Major depressive disorder, recurrent, moderate: Secondary | ICD-10-CM | POA: Diagnosis not present

## 2023-09-01 DIAGNOSIS — F331 Major depressive disorder, recurrent, moderate: Secondary | ICD-10-CM | POA: Diagnosis not present

## 2023-09-05 ENCOUNTER — Ambulatory Visit: Payer: Medicaid Other | Admitting: Internal Medicine

## 2023-09-05 DIAGNOSIS — F331 Major depressive disorder, recurrent, moderate: Secondary | ICD-10-CM | POA: Diagnosis not present

## 2023-09-08 DIAGNOSIS — F331 Major depressive disorder, recurrent, moderate: Secondary | ICD-10-CM | POA: Diagnosis not present

## 2023-09-09 DIAGNOSIS — E162 Hypoglycemia, unspecified: Secondary | ICD-10-CM | POA: Diagnosis not present

## 2023-09-09 DIAGNOSIS — F314 Bipolar disorder, current episode depressed, severe, without psychotic features: Secondary | ICD-10-CM | POA: Diagnosis not present

## 2023-09-09 DIAGNOSIS — Z62810 Personal history of physical and sexual abuse in childhood: Secondary | ICD-10-CM | POA: Diagnosis not present

## 2023-09-09 DIAGNOSIS — Z9151 Personal history of suicidal behavior: Secondary | ICD-10-CM | POA: Diagnosis not present

## 2023-09-09 DIAGNOSIS — Z91018 Allergy to other foods: Secondary | ICD-10-CM | POA: Diagnosis not present

## 2023-09-09 DIAGNOSIS — Z9152 Personal history of nonsuicidal self-harm: Secondary | ICD-10-CM | POA: Diagnosis not present

## 2023-09-09 DIAGNOSIS — N809 Endometriosis, unspecified: Secondary | ICD-10-CM | POA: Diagnosis not present

## 2023-09-09 DIAGNOSIS — G4700 Insomnia, unspecified: Secondary | ICD-10-CM | POA: Diagnosis not present

## 2023-09-09 DIAGNOSIS — F4310 Post-traumatic stress disorder, unspecified: Secondary | ICD-10-CM | POA: Diagnosis not present

## 2023-09-09 DIAGNOSIS — R45851 Suicidal ideations: Secondary | ICD-10-CM | POA: Diagnosis not present

## 2023-09-09 DIAGNOSIS — F333 Major depressive disorder, recurrent, severe with psychotic symptoms: Secondary | ICD-10-CM | POA: Diagnosis not present

## 2023-09-09 DIAGNOSIS — F419 Anxiety disorder, unspecified: Secondary | ICD-10-CM | POA: Diagnosis not present

## 2023-09-10 DIAGNOSIS — F333 Major depressive disorder, recurrent, severe with psychotic symptoms: Secondary | ICD-10-CM | POA: Diagnosis not present

## 2023-09-10 DIAGNOSIS — F419 Anxiety disorder, unspecified: Secondary | ICD-10-CM | POA: Diagnosis not present

## 2023-09-10 DIAGNOSIS — F4310 Post-traumatic stress disorder, unspecified: Secondary | ICD-10-CM | POA: Diagnosis not present

## 2023-09-10 DIAGNOSIS — F319 Bipolar disorder, unspecified: Secondary | ICD-10-CM | POA: Diagnosis not present

## 2023-09-10 DIAGNOSIS — R45851 Suicidal ideations: Secondary | ICD-10-CM | POA: Diagnosis not present

## 2023-09-10 DIAGNOSIS — R441 Visual hallucinations: Secondary | ICD-10-CM | POA: Diagnosis not present

## 2023-09-11 DIAGNOSIS — F4310 Post-traumatic stress disorder, unspecified: Secondary | ICD-10-CM | POA: Diagnosis not present

## 2023-09-11 DIAGNOSIS — F331 Major depressive disorder, recurrent, moderate: Secondary | ICD-10-CM | POA: Diagnosis not present

## 2023-09-11 DIAGNOSIS — R45851 Suicidal ideations: Secondary | ICD-10-CM | POA: Diagnosis not present

## 2023-09-11 DIAGNOSIS — F419 Anxiety disorder, unspecified: Secondary | ICD-10-CM | POA: Diagnosis not present

## 2023-09-11 DIAGNOSIS — R441 Visual hallucinations: Secondary | ICD-10-CM | POA: Diagnosis not present

## 2023-09-11 DIAGNOSIS — F319 Bipolar disorder, unspecified: Secondary | ICD-10-CM | POA: Diagnosis not present

## 2023-09-11 DIAGNOSIS — F333 Major depressive disorder, recurrent, severe with psychotic symptoms: Secondary | ICD-10-CM | POA: Diagnosis not present

## 2023-09-12 DIAGNOSIS — R441 Visual hallucinations: Secondary | ICD-10-CM | POA: Diagnosis not present

## 2023-09-12 DIAGNOSIS — R45851 Suicidal ideations: Secondary | ICD-10-CM | POA: Diagnosis not present

## 2023-09-12 DIAGNOSIS — F319 Bipolar disorder, unspecified: Secondary | ICD-10-CM | POA: Diagnosis not present

## 2023-09-12 DIAGNOSIS — F4310 Post-traumatic stress disorder, unspecified: Secondary | ICD-10-CM | POA: Diagnosis not present

## 2023-09-12 DIAGNOSIS — F333 Major depressive disorder, recurrent, severe with psychotic symptoms: Secondary | ICD-10-CM | POA: Diagnosis not present

## 2023-09-12 DIAGNOSIS — F419 Anxiety disorder, unspecified: Secondary | ICD-10-CM | POA: Diagnosis not present

## 2023-09-13 DIAGNOSIS — F333 Major depressive disorder, recurrent, severe with psychotic symptoms: Secondary | ICD-10-CM | POA: Diagnosis not present

## 2023-09-14 DIAGNOSIS — F333 Major depressive disorder, recurrent, severe with psychotic symptoms: Secondary | ICD-10-CM | POA: Diagnosis not present

## 2023-09-15 DIAGNOSIS — R45851 Suicidal ideations: Secondary | ICD-10-CM | POA: Diagnosis not present

## 2023-09-15 DIAGNOSIS — F4310 Post-traumatic stress disorder, unspecified: Secondary | ICD-10-CM | POA: Diagnosis not present

## 2023-09-15 DIAGNOSIS — R441 Visual hallucinations: Secondary | ICD-10-CM | POA: Diagnosis not present

## 2023-09-15 DIAGNOSIS — F419 Anxiety disorder, unspecified: Secondary | ICD-10-CM | POA: Diagnosis not present

## 2023-09-15 DIAGNOSIS — F333 Major depressive disorder, recurrent, severe with psychotic symptoms: Secondary | ICD-10-CM | POA: Diagnosis not present

## 2023-09-15 DIAGNOSIS — F319 Bipolar disorder, unspecified: Secondary | ICD-10-CM | POA: Diagnosis not present

## 2023-09-16 ENCOUNTER — Ambulatory Visit: Payer: Medicaid Other | Admitting: Obstetrics and Gynecology

## 2023-09-16 ENCOUNTER — Encounter: Payer: Self-pay | Admitting: Obstetrics and Gynecology

## 2023-09-16 VITALS — BP 127/86 | HR 79 | Ht 65.0 in | Wt 225.6 lb

## 2023-09-16 DIAGNOSIS — N809 Endometriosis, unspecified: Secondary | ICD-10-CM | POA: Diagnosis not present

## 2023-09-16 DIAGNOSIS — N946 Dysmenorrhea, unspecified: Secondary | ICD-10-CM

## 2023-09-16 DIAGNOSIS — R102 Pelvic and perineal pain: Secondary | ICD-10-CM

## 2023-09-16 NOTE — Progress Notes (Signed)
Patient presents today to discuss surgical options. She states a history of painful cycles along with breakthrough bleeding and endometriosis. Currently uses BTL for birth control. Patient would like a hysterectomy at this time.
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
## 2023-09-17 DIAGNOSIS — F3112 Bipolar disorder, current episode manic without psychotic features, moderate: Secondary | ICD-10-CM | POA: Diagnosis not present

## 2023-09-17 DIAGNOSIS — F4312 Post-traumatic stress disorder, chronic: Secondary | ICD-10-CM | POA: Diagnosis not present

## 2023-09-17 DIAGNOSIS — F331 Major depressive disorder, recurrent, moderate: Secondary | ICD-10-CM | POA: Diagnosis not present

## 2023-09-17 DIAGNOSIS — F250 Schizoaffective disorder, bipolar type: Secondary | ICD-10-CM | POA: Diagnosis not present

## 2023-09-17 DIAGNOSIS — F411 Generalized anxiety disorder: Secondary | ICD-10-CM | POA: Diagnosis not present

## 2023-09-20 DIAGNOSIS — F331 Major depressive disorder, recurrent, moderate: Secondary | ICD-10-CM | POA: Diagnosis not present

## 2023-09-23 DIAGNOSIS — F4312 Post-traumatic stress disorder, chronic: Secondary | ICD-10-CM | POA: Diagnosis not present

## 2023-09-23 DIAGNOSIS — F250 Schizoaffective disorder, bipolar type: Secondary | ICD-10-CM | POA: Diagnosis not present

## 2023-09-23 DIAGNOSIS — F411 Generalized anxiety disorder: Secondary | ICD-10-CM | POA: Diagnosis not present

## 2023-09-24 DIAGNOSIS — F331 Major depressive disorder, recurrent, moderate: Secondary | ICD-10-CM | POA: Diagnosis not present

## 2023-09-25 ENCOUNTER — Encounter: Payer: Self-pay | Admitting: Oncology

## 2023-09-25 ENCOUNTER — Ambulatory Visit (INDEPENDENT_AMBULATORY_CARE_PROVIDER_SITE_OTHER): Payer: Medicaid Other | Admitting: Obstetrics and Gynecology

## 2023-09-25 ENCOUNTER — Encounter: Payer: Self-pay | Admitting: Obstetrics and Gynecology

## 2023-09-25 ENCOUNTER — Encounter: Payer: Medicaid Other | Admitting: Obstetrics and Gynecology

## 2023-09-25 VITALS — BP 129/84 | HR 79 | Ht 65.0 in | Wt 228.2 lb

## 2023-09-25 DIAGNOSIS — Z01818 Encounter for other preprocedural examination: Secondary | ICD-10-CM

## 2023-09-25 DIAGNOSIS — N809 Endometriosis, unspecified: Secondary | ICD-10-CM

## 2023-09-25 DIAGNOSIS — F411 Generalized anxiety disorder: Secondary | ICD-10-CM | POA: Diagnosis not present

## 2023-09-25 DIAGNOSIS — R102 Pelvic and perineal pain: Secondary | ICD-10-CM | POA: Diagnosis not present

## 2023-09-25 DIAGNOSIS — F3112 Bipolar disorder, current episode manic without psychotic features, moderate: Secondary | ICD-10-CM | POA: Diagnosis not present

## 2023-09-25 DIAGNOSIS — N946 Dysmenorrhea, unspecified: Secondary | ICD-10-CM | POA: Diagnosis not present

## 2023-09-25 DIAGNOSIS — F331 Major depressive disorder, recurrent, moderate: Secondary | ICD-10-CM | POA: Diagnosis not present

## 2023-09-25 DIAGNOSIS — F250 Schizoaffective disorder, bipolar type: Secondary | ICD-10-CM | POA: Diagnosis not present

## 2023-09-25 DIAGNOSIS — F4312 Post-traumatic stress disorder, chronic: Secondary | ICD-10-CM | POA: Diagnosis not present

## 2023-09-25 MED ORDER — METRONIDAZOLE 500 MG PO TABS
500.0000 mg | ORAL_TABLET | Freq: Two times a day (BID) | ORAL | 0 refills | Status: DC
Start: 2023-09-25 — End: 2023-10-20
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
## 2023-09-25 NOTE — Progress Notes (Signed)
Patient presents today for a pre-op exam prior to hysterectomy and oophorectomy. She states no additional concerns today.

## 2023-10-01 DIAGNOSIS — F4312 Post-traumatic stress disorder, chronic: Secondary | ICD-10-CM | POA: Diagnosis not present

## 2023-10-01 DIAGNOSIS — F3112 Bipolar disorder, current episode manic without psychotic features, moderate: Secondary | ICD-10-CM | POA: Diagnosis not present

## 2023-10-01 DIAGNOSIS — F250 Schizoaffective disorder, bipolar type: Secondary | ICD-10-CM | POA: Diagnosis not present

## 2023-10-01 DIAGNOSIS — F411 Generalized anxiety disorder: Secondary | ICD-10-CM | POA: Diagnosis not present

## 2023-10-03 ENCOUNTER — Other Ambulatory Visit: Payer: Self-pay

## 2023-10-03 ENCOUNTER — Emergency Department: Payer: Medicaid Other

## 2023-10-03 ENCOUNTER — Emergency Department
Admission: EM | Admit: 2023-10-03 | Discharge: 2023-10-03 | Disposition: A | Discharge status: Home / Self Care | Payer: Medicaid Other | Attending: Emergency Medicine | Admitting: Emergency Medicine

## 2023-10-03 DIAGNOSIS — R079 Chest pain, unspecified: Secondary | ICD-10-CM | POA: Diagnosis not present

## 2023-10-03 DIAGNOSIS — R6889 Other general symptoms and signs: Secondary | ICD-10-CM

## 2023-10-03 DIAGNOSIS — R0981 Nasal congestion: Secondary | ICD-10-CM | POA: Diagnosis not present

## 2023-10-03 DIAGNOSIS — J029 Acute pharyngitis, unspecified: Secondary | ICD-10-CM | POA: Diagnosis not present

## 2023-10-03 DIAGNOSIS — F4312 Post-traumatic stress disorder, chronic: Secondary | ICD-10-CM | POA: Diagnosis not present

## 2023-10-03 DIAGNOSIS — F411 Generalized anxiety disorder: Secondary | ICD-10-CM | POA: Diagnosis not present

## 2023-10-03 DIAGNOSIS — R0789 Other chest pain: Secondary | ICD-10-CM | POA: Diagnosis not present

## 2023-10-03 DIAGNOSIS — R112 Nausea with vomiting, unspecified: Secondary | ICD-10-CM | POA: Diagnosis not present

## 2023-10-03 DIAGNOSIS — R509 Fever, unspecified: Secondary | ICD-10-CM | POA: Insufficient documentation

## 2023-10-03 DIAGNOSIS — R0602 Shortness of breath: Secondary | ICD-10-CM | POA: Diagnosis not present

## 2023-10-03 DIAGNOSIS — F331 Major depressive disorder, recurrent, moderate: Secondary | ICD-10-CM | POA: Diagnosis not present

## 2023-10-03 DIAGNOSIS — F250 Schizoaffective disorder, bipolar type: Secondary | ICD-10-CM | POA: Diagnosis not present

## 2023-10-03 LAB — HEPATIC FUNCTION PANEL
ALT: 25 U/L (ref 0–44)
AST: 20 U/L (ref 15–41)
Albumin: 4.3 g/dL (ref 3.5–5.0)
Alkaline Phosphatase: 58 U/L (ref 38–126)
Bilirubin, Direct: 0.1 mg/dL (ref 0.0–0.2)
Indirect Bilirubin: 0.9 mg/dL (ref 0.3–0.9)
Total Bilirubin: 1 mg/dL (ref 0.0–1.2)
Total Protein: 7.6 g/dL (ref 6.5–8.1)

## 2023-10-03 LAB — GROUP A STREP BY PCR: Group A Strep by PCR: NOT DETECTED

## 2023-10-03 LAB — BASIC METABOLIC PANEL
Anion gap: 12 (ref 5–15)
BUN: 14 mg/dL (ref 6–20)
CO2: 23 mmol/L (ref 22–32)
Calcium: 9.6 mg/dL (ref 8.9–10.3)
Chloride: 103 mmol/L (ref 98–111)
Creatinine, Ser: 0.83 mg/dL (ref 0.44–1.00)
GFR, Estimated: >60 mL/min (ref >60)
Glucose, Bld: 118 mg/dL — ABNORMAL HIGH (ref 70–99)
Potassium: 3.5 mmol/L (ref 3.5–5.1)
Sodium: 138 mmol/L (ref 135–145)

## 2023-10-03 LAB — TROPONIN I (HIGH SENSITIVITY): Troponin I (High Sensitivity): <2 ng/L (ref <18)

## 2023-10-03 LAB — CBC
HCT: 40.1 % (ref 36.0–46.0)
Hemoglobin: 13.6 g/dL (ref 12.0–15.0)
MCH: 29.5 pg (ref 26.0–34.0)
MCHC: 33.9 g/dL (ref 30.0–36.0)
MCV: 87 fL (ref 80.0–100.0)
Platelets: 276 10*3/uL (ref 150–400)
RBC: 4.61 MIL/uL (ref 3.87–5.11)
RDW: 12.8 % (ref 11.5–15.5)
WBC: 9.5 10*3/uL (ref 4.0–10.5)
nRBC: 0 % (ref 0.0–0.2)

## 2023-10-03 LAB — RESP PANEL BY RT-PCR (RSV, FLU A&B, COVID)  RVPGX2
Influenza A by PCR: NEGATIVE
Influenza B by PCR: NEGATIVE
Resp Syncytial Virus by PCR: NEGATIVE
SARS Coronavirus 2 by RT PCR: NEGATIVE

## 2023-10-03 LAB — HCG, QUANTITATIVE, PREGNANCY: hCG, Beta Chain, Quant, S: <1 m[IU]/mL (ref <5)

## 2023-10-03 MED ORDER — DEXAMETHASONE SODIUM PHOSPHATE 10 MG/ML IJ SOLN
10.0000 mg | Freq: Once | INTRAMUSCULAR | Status: AC
  Administered 2023-10-03: 10 mg via INTRAVENOUS
  Filled 2023-10-03: qty 1

## 2023-10-03 MED ORDER — ONDANSETRON 4 MG PO TBDP
4.0000 mg | ORAL_TABLET | Freq: Once | ORAL | Status: AC
  Administered 2023-10-03: 4 mg via ORAL
  Filled 2023-10-03: qty 1

## 2023-10-03 MED ORDER — IBUPROFEN 800 MG PO TABS
800.0000 mg | ORAL_TABLET | Freq: Once | ORAL | Status: AC
  Administered 2023-10-03: 800 mg via ORAL
  Filled 2023-10-03: qty 1

## 2023-10-03 MED ORDER — ONDANSETRON 4 MG PO TBDP: 4.0000 mg | ORAL_TABLET | Freq: Four times a day (QID) | ORAL | 0 refills | Status: DC | PRN

## 2023-10-03 MED ORDER — LIDOCAINE VISCOUS HCL 2 % MT SOLN
15.0000 mL | Freq: Once | OROMUCOSAL | Status: AC
  Administered 2023-10-03: 15 mL via OROMUCOSAL
  Filled 2023-10-03: qty 15

## 2023-10-03 MED ORDER — SODIUM CHLORIDE 0.9 % IV BOLUS (SEPSIS)
1000.0000 mL | Freq: Once | INTRAVENOUS | Status: AC
  Administered 2023-10-03: 1000 mL via INTRAVENOUS

## 2023-10-03 NOTE — Discharge Instructions (Addendum)
 You may alternate Tylenol 1000 mg every 6 hours as needed for fever and pain and ibuprofen 800 mg every 6-8 hours as needed for fever and pain. Please rest and drink plenty of fluids. This is a viral illness causing your symptoms. You do not need antibiotics for a virus. You may use over-the-counter nasal saline spray and Afrin nasal saline spray as needed for nasal congestion. Please do not use Afrin for more than 3 days in a row. You may use guaifenesin and dextromethorphan as needed for cough.  You may use lozenges and Chloraseptic spray to help with sore throat.  Warm salt water gargles can also help with sore throat.  You may use over-the-counter Unisom (doxyalamine) or Benadryl (diphenhydramine) to help with sleep.  Please note that some combination medicines such as DayQuil and NyQuil have multiple medications in them.  Please make sure you look at all labels to ensure that you are not taking too much of any one particular medication.  Symptoms from a virus may take 7-14 days to run its course.  You may also use honey and salt water gargles to help with sore throat, cough, postnasal drainage.  The flu is treated like any other virus with supportive measures as listed above. At this time you are outside the treatment window for Tamiflu or Xofluza. These medications have to be taken within the first 48 hours of symptoms.  Tamiflu has many side effects including nausea, vomiting and diarrhea.

## 2023-10-03 NOTE — ED Triage Notes (Signed)
 Pt c/o chest pain shortness of breath cough congestion that she woke up with this morning. Pt reports hx asthma

## 2023-10-03 NOTE — ED Provider Notes (Signed)
 [facility] Provider Note    Event Date/Time   First MD Initiated Contact with Patient 10/03/23 [phone]     (approximate)   History   Chest Pain   HPI  Anna Flores is a 32 y.o. female with history of depression, bipolar disorder, anemia, endometriosis who presents to the emergency department with complaints of flulike symptoms for several days.  Reports fevers of 102, cough, congestion, sore throat, vomiting.  No diarrhea.  No abdominal pain, abnormal vaginal bleeding or discharge, dysuria or hematuria.  Patient reports she took a home flu test that was positive for influenza B.   History provided by patient.    Past Medical History:  Diagnosis Date   Abdominal pain 10/26/2013   Anemia 2013   Anxiety    Back pain affecting pregnancy in third trimester 02/28/2020   Bipolar disorder (HCC)    Depression    History of Papanicolaou smear of cervix 10/02/2016   NEG   IDA (iron deficiency anemia) 02/29/2020   Indication for care in labor and delivery, antepartum 03/07/2020   Mastitis 01/19/2021   right   Menometrorrhagia 10/26/2013   PONV (postoperative nausea and vomiting)    Postpartum galactocele 02/01/2021   Pregnancy    DELIVERED 08/12/16 - JEG    Past Surgical History:  Procedure Laterality Date   DIAGNOSTIC LAPAROSCOPY  2018   suspected ectopic pregnancy-[facility]   DILATION AND EVACUATION N/A 06/11/2021   Procedure: D & C;  Surgeon: Conard Novak, MD;  Location: [facility];  Service: Gynecology;  Laterality: N/A;   INCISION AND DRAINAGE ABSCESS Right 02/06/2021   Procedure: INCISION AND DRAINAGE ABSCESS;  Surgeon: Leafy Ro, MD;  Location: [facility];  Service: General;  Laterality: Right;   LAPAROSCOPY N/A 06/11/2021   Procedure: LAPAROSCOPY DIAGNOSTIC;  Surgeon: Conard Novak, MD;  Location: [facility];  Service: Gynecology;  Laterality: N/A;   TONSILLECTOMY     XI ROBOTIC ASSISTED SALPINGECTOMY Bilateral 07/24/2021   Procedure: XI ROBOTIC  ASSISTED SALPINGECTOMY;  Surgeon: [name], MD;  Location: [facility];  Service: Gynecology;  Laterality: Bilateral;    MEDICATIONS:  Prior to Admission medications   Medication Sig Start Date End Date Taking? Authorizing Provider  Accu-Chek Softclix Lancets lancets USE AS INSTRUCTED 12/06/22   Margarita Mail, DO  albuterol (VENTOLIN HFA) 108 (90 Base) MCG/ACT inhaler Inhale 2 puffs into the lungs every 6 (six) hours as needed for wheezing or shortness of breath. 06/13/23   Willy Eddy, MD  ARIPiprazole (ABILIFY) 30 MG tablet Take 30 mg by mouth daily. 04/28/23   [provider]  busPIRone (BUSPAR) 10 MG tablet Take 10 mg by mouth 3 (three) times daily. 03/18/22   [provider]  clonazePAM (KLONOPIN) 2 MG tablet Take 2 mg by mouth daily as needed. 04/11/23   [provider]  lamoTRIgine (LAMICTAL) 200 MG tablet Take 200 mg by mouth at bedtime. 09/16/21   [provider]  metroNIDAZOLE (FLAGYL) 500 MG tablet Take 1 tablet (500 mg total) by mouth 2 (two) times daily. Begin 5 days prior to scheduled surgery as directed. 09/25/23   Linzie Collin, MD  OLANZapine zydis (ZYPREXA) 5 MG disintegrating tablet Take 5 mg by mouth daily. 04/28/23   [provider]  ondansetron (ZOFRAN-ODT) 4 MG disintegrating tablet Take 1 tablet (4 mg total) by mouth every 8 (eight) hours as needed for nausea or vomiting. 06/13/23   Willy Eddy, MD  traZODone (DESYREL) 100 MG tablet Take  150 mg by mouth at bedtime as needed. 11/07/21   [provider]    Physical Exam   Triage Vital Signs: ED Triage Vitals  Encounter Vitals Group     BP 10/03/23 0430 (!) 138/90     Systolic BP Percentile --      Diastolic BP Percentile --      Pulse Rate 10/03/23 0430 99     Resp 10/03/23 0430 20     Temp 10/03/23 0430 98.5 F (36.9 C)     Temp Source 10/03/23 0430 Oral     SpO2 10/03/23 0430 98 %     Weight 10/03/23 0430 224 lb (101.6 kg)     Height  10/03/23 0430 5\' 5"  (1.651 m)     Head Circumference --      Peak Flow --      Pain Score 10/03/23 0440 5     Pain Loc --      Pain Education --      Exclude from Growth Chart --     Most recent vital signs: Vitals:   10/03/23 0700 10/03/23 0730  BP: 124/78 117/70  Pulse: 96 82  Resp: (!) 24 15  Temp:    SpO2: 99% 95%    CONSTITUTIONAL: Alert, responds appropriately to questions. Well-appearing; well-nourished HEAD: Normocephalic, atraumatic EYES: Conjunctivae clear, pupils appear equal, sclera nonicteric ENT: normal nose; moist mucous membranes; + posterior pharyngeal erythema without petechiae, no tonsillar hypertrophy or exudate, no uvular deviation, no unilateral swelling in posterior oropharynx, no trismus or drooling, no muffled voice, normal phonation, no stridor, airway patent. NECK: Supple, normal ROM, no meningismus CARD: RRR; S1 and S2 appreciated RESP: Normal chest excursion without splinting or tachypnea; breath sounds clear and equal bilaterally; no wheezes, no rhonchi, no rales, no hypoxia or respiratory distress, speaking full sentences ABD/GI: Non-distended; soft, non-tender, no rebound, no guarding, no peritoneal signs BACK: The back appears normal EXT: Normal ROM in all joints; no deformity noted, no edema SKIN: Normal color for age and race; warm; no rash on exposed skin NEURO: Moves all extremities equally, normal speech PSYCH: The patient's mood and manner are appropriate.   ED Results / Procedures / Treatments   LABS: (all labs ordered are listed, but only abnormal results are displayed) Labs Reviewed  BASIC METABOLIC PANEL - Abnormal; Notable for the following components:      Result Value   Glucose, Bld 118 (*)    All other components within normal limits  RESP PANEL BY RT-PCR (RSV, FLU A&B, COVID)  [id number]  GROUP A STREP BY PCR  CBC  HCG, QUANTITATIVE, PREGNANCY  HEPATIC FUNCTION PANEL  TROPONIN I (HIGH SENSITIVITY)     EKG:  EKG  Interpretation Date/Time:  Friday October 03 2023 04:34:47 EST Ventricular Rate:  96 PR Interval:  130 QRS Duration:  88 QT Interval:  340 QTC Calculation: 429 R Axis:   55  Text Interpretation: Normal sinus rhythm ST & T wave abnormality, consider lateral ischemia Abnormal ECG When compared with ECG of 13-Jun-2023 11:45, Nonspecific T wave abnormality has replaced inverted T waves in Anterior leads Inverted T waves have replaced nonspecific T wave abnormality in Lateral leads Confirmed by Rochele Raring [id number]) on 10/03/2023 6:23:40 AM         RADIOLOGY: My personal review and interpretation of imaging: Chest x-ray clear.  I have personally reviewed all radiology reports.   DG Chest 2 View Result Date: 10/03/2023 CLINICAL DATA:  Chest pain and shortness of breath  EXAM: CHEST - 2 VIEW COMPARISON:  06/13/2023 FINDINGS: The heart size and mediastinal contours are within normal limits. Both lungs are clear. The visualized skeletal structures are unremarkable. IMPRESSION: No active cardiopulmonary disease. Electronically Signed   By: Judie Petit.  Shick M.D.   On: 10/03/2023 07:28     PROCEDURES:  Critical Care performed: No     .1-3 Lead EKG Interpretation  Performed by: Marquise Wicke, Layla Maw, DO Authorized by: Nikolina Simerson, Layla Maw, DO     Interpretation: normal     ECG rate:  82   ECG rate assessment: normal     Rhythm: sinus rhythm     Ectopy: none     Conduction: normal       IMPRESSION / MDM / ASSESSMENT AND PLAN / ED COURSE  I reviewed the triage vital signs and the nursing notes.    Patient here with flulike symptoms.  The patient is on the cardiac monitor to evaluate for evidence of arrhythmia and/or significant heart rate changes.   DIFFERENTIAL DIAGNOSIS (includes but not limited to):   Viral illness, pneumonia, pharyngitis, strep, no signs of tonsillitis or deep space neck infection, doubt PTA, doubt ACS, PE, dissection, CHF, pneumothorax   Patient's presentation is most  consistent with acute complicated illness / injury requiring diagnostic workup.   PLAN: Workup initiated from triage.  No leukocytosis or leukopenia.  Normal hemoglobin, electrolytes.  COVID, flu and RSV negative here but states she had a positive flu test at home.  Troponin negative.  Low suspicion for ACS.  No indication for serial enzymes.  Will obtain strep swab.  Chest x-ray reviewed and interpreted by myself and the radiologist and is clear.  Will give ibuprofen, Zofran, p.o. challenge.  Patient outside treatment window for antivirals.   MEDICATIONS GIVEN IN ED: Medications  sodium chloride 0.9 % bolus 1,000 mL (has no administration in time range)  dexamethasone (DECADRON) injection 10 mg (has no administration in time range)  lidocaine (XYLOCAINE) 2 % viscous mouth solution 15 mL (has no administration in time range)  ibuprofen (ADVIL) tablet 800 mg (800 mg Oral Given 10/03/23 0736)  ondansetron (ZOFRAN-ODT) disintegrating tablet 4 mg (4 mg Oral Given 10/03/23 0736)     ED COURSE: Strep test negative.  Patient reports she has now feeling lightheaded.  She states she feels like she has a hard time keeping down fluids at home because of her vomiting and her sore throat.  Will give lidocaine, Decadron for symptomatic relief for her sore throat.  No signs of deep space neck infection, PTA and strep test is negative.  Will give IV fluids for lightheadedness.  Anticipate when she is feeling better that she can be discharged home.  At this time, I do not feel there is any life-threatening condition present. I reviewed all nursing notes, vitals, pertinent previous records.  All lab and urine results, EKGs, imaging ordered have been independently reviewed and interpreted by myself.  I reviewed all available radiology reports from any imaging ordered this visit.  Based on my assessment, I feel the patient is safe to be discharged home without further emergent workup and can continue workup as an  outpatient as needed. Discussed all findings, treatment plan as well as usual and customary return precautions.  They verbalize understanding and are comfortable with this plan.  Outpatient follow-up has been provided as needed.  All questions have been answered.    CONSULTS:  none   OUTSIDE RECORDS REVIEWED: Reviewed recent OB/GYN notes.  FINAL CLINICAL IMPRESSION(S) / ED DIAGNOSES   Final diagnoses:  Flu-like symptoms  Nausea and vomiting in adult     Rx / DC Orders   ED Discharge Orders          Ordered    ondansetron (ZOFRAN-ODT) 4 MG disintegrating tablet  Every 6 hours PRN        10/03/23 0838             Note:  This document was prepared using Dragon voice recognition software and may include unintentional dictation errors.   Kyen Taite, Layla Maw, [place] 10/03/23 [phone]

## 2023-10-03 NOTE — ED Notes (Signed)
 Pt ambulatory to waiting room. Pt verbalized understanding of discharge instructions.

## 2023-10-04 DIAGNOSIS — F331 Major depressive disorder, recurrent, moderate: Secondary | ICD-10-CM | POA: Diagnosis not present

## 2023-10-07 DIAGNOSIS — F331 Major depressive disorder, recurrent, moderate: Secondary | ICD-10-CM | POA: Diagnosis not present

## 2023-10-08 DIAGNOSIS — F331 Major depressive disorder, recurrent, moderate: Secondary | ICD-10-CM | POA: Diagnosis not present

## 2023-10-13 ENCOUNTER — Encounter
Admission: RE | Admit: 2023-10-13 | Discharge: 2023-10-13 | Disposition: A | Discharge status: Home / Self Care | Payer: Medicaid Other | Source: Ambulatory Visit | Attending: Obstetrics and Gynecology | Admitting: Obstetrics and Gynecology

## 2023-10-13 ENCOUNTER — Other Ambulatory Visit: Payer: Self-pay

## 2023-10-13 VITALS — Ht 64.0 in | Wt 224.0 lb

## 2023-10-13 DIAGNOSIS — Z01812 Encounter for preprocedural laboratory examination: Secondary | ICD-10-CM

## 2023-10-13 NOTE — Patient Instructions (Addendum)
 Your procedure is scheduled on: Monday 10/20/23 Report to the Registration Desk on the 1st floor of the [facility]. To find out your arrival time, please call [phone] between 1PM - 3PM on: Friday 10/17/23 If your arrival time is 6:00 am, do not arrive before that time as the [facility] entrance doors do not open until 6:00 am.  REMEMBER: Instructions that are not followed completely may result in serious medical risk, up to and including death; or upon the discretion of your surgeon and anesthesiologist your surgery may need to be rescheduled.  Do not eat food or drink fluids after midnight the night before surgery.  No gum chewing or hard candies.   One week prior to surgery: Stop Anti-inflammatories (NSAIDS) such as Advil, Aleve, Ibuprofen, Motrin, Naproxen, Naprosyn and Aspirin based products such as Excedrin, Goody's Powder, BC Powder. Stop ANY OVER THE COUNTER supplements until after surgery.  You may however, continue to take Tylenol if needed for pain up until the day of surgery.  Continue taking all of your other prescription medications up until the day of surgery.  ON THE DAY OF SURGERY ONLY TAKE THESE MEDICATIONS WITH SIPS OF WATER:  ARIPiprazole (ABILIFY) 30 MG  busPIRone (BUSPAR) 10 MG  hydrOXYzine (ATARAX) 25 MG   Use inhalers on the day of surgery and bring to the hospital. albuterol (VENTOLIN HFA) 108 (90 Base) MCG/ACT inhaler   No Alcohol for 24 hours before or after surgery.  No Smoking including e-cigarettes for 24 hours before surgery.  No chewable tobacco products for at least 6 hours before surgery.  No nicotine patches on the day of surgery.  Do not use any "recreational" drugs for at least a week (preferably 2 weeks) before your surgery.  Please be advised that the combination of cocaine and anesthesia may have negative outcomes, up to and including death. If you test positive for cocaine, your surgery will be cancelled.  On the morning of  surgery brush your teeth with toothpaste and water, you may rinse your mouth with mouthwash if you wish. Do not swallow any toothpaste or mouthwash.  Use CHG Soap as directed on instruction sheet.  Do not wear jewelry, make-up, hairpins, clips or nail polish.  For welded (permanent) jewelry: bracelets, anklets, waist bands, etc.  Please have this removed prior to surgery.  If it is not removed, there is a chance that hospital personnel will need to cut it off on the day of surgery.  Do not wear lotions, powders, or perfumes.   Do not shave body hair from the neck down 48 hours before surgery.  Contact lenses, hearing aids and dentures may not be worn into surgery.  Do not bring valuables to the hospital. [facility] is not responsible for any missing/lost belongings or valuables.   Notify your doctor if there is any change in your medical condition (cold, fever, infection).  Wear comfortable clothing (specific to your surgery type) to the hospital.  After surgery, you can help prevent lung complications by doing breathing exercises.  Take deep breaths and cough every 1-2 hours. Your doctor may order a device called an Incentive Spirometer to help you take deep breaths. When coughing or sneezing, hold a pillow firmly against your incision with both hands. This is called "splinting." Doing this helps protect your incision. It also decreases belly discomfort.  If you are being admitted to the hospital overnight, leave your suitcase in the car. After surgery it may be brought to your room.  In case of increased patient census, it may be necessary for you, the patient, to continue your postoperative care in the Same Day Surgery department.  If you are being discharged the day of surgery, you will not be allowed to drive home. You will need a responsible individual to drive you home and stay with you for 24 hours after surgery.   If you are taking public transportation, you will need to  have a responsible individual with you.  Please call the Pre-admissions Testing Dept. at [phone] if you have any questions about these instructions.  Surgery Visitation Policy:  Patients having surgery or a procedure may have two visitors.  Children under the age of 31 must have an adult with them who is not the patient.  Temporary Visitor Restrictions Due to increasing cases of flu, RSV and COVID-19: Children ages 73 and under will not be able to visit patients in [facility] hospitals under most circumstances.  Inpatient Visitation:    Visiting hours are 7 a.m. to 8 p.m. Up to four visitors are allowed at one time in a patient room. The visitors may rotate out with other people during the day.  One visitor age 38 or older may stay with the patient overnight and must be in the room by 8 p.m.     Preparing for Surgery with CHLORHEXIDINE GLUCONATE (CHG) Soap  Chlorhexidine Gluconate (CHG) Soap  o An antiseptic cleaner that kills germs and bonds with the skin to continue killing germs even after washing  o Used for showering the night before surgery and morning of surgery  Before surgery, you can play an important role by reducing the number of germs on your skin.  CHG (Chlorhexidine gluconate) soap is an antiseptic cleanser which kills germs and bonds with the skin to continue killing germs even after washing.  Please do not use if you have an allergy to CHG or antibacterial soaps. If your skin becomes reddened/irritated stop using the CHG.  1. Shower the NIGHT BEFORE SURGERY and the MORNING OF SURGERY with CHG soap.  2. If you choose to wash your hair, wash your hair first as usual with your normal shampoo.  3. After shampooing, rinse your hair and body thoroughly to remove the shampoo.  4. Use CHG as you would any other liquid soap. You can apply CHG directly to the skin and wash gently with a scrungie or a clean washcloth.  5. Apply the CHG soap to your body only  from the neck down. Do not use on open wounds or open sores. Avoid contact with your eyes, ears, mouth, and genitals (private parts). Wash face and genitals (private parts) with your normal soap.  6. Wash thoroughly, paying special attention to the area where your surgery will be performed.  7. Thoroughly rinse your body with warm water.  8. Do not shower/wash with your normal soap after using and rinsing off the CHG soap.  9. Pat yourself dry with a clean towel.  10. Wear clean pajamas to bed the night before surgery.  12. Place clean sheets on your bed the night of your first shower and do not sleep with pets.  13. Shower again with the CHG soap on the day of surgery prior to arriving at the hospital.  14. Do not apply any deodorants/lotions/powders.  15. Please wear clean clothes to the hospital.

## 2023-10-14 ENCOUNTER — Encounter
Admission: RE | Admit: 2023-10-14 | Discharge: 2023-10-14 | Disposition: A | Discharge status: Home / Self Care | Source: Ambulatory Visit | Attending: Obstetrics and Gynecology | Admitting: Obstetrics and Gynecology

## 2023-10-14 DIAGNOSIS — R102 Pelvic and perineal pain: Secondary | ICD-10-CM | POA: Insufficient documentation

## 2023-10-14 DIAGNOSIS — Z01818 Encounter for other preprocedural examination: Secondary | ICD-10-CM

## 2023-10-14 DIAGNOSIS — Z01812 Encounter for preprocedural laboratory examination: Secondary | ICD-10-CM | POA: Insufficient documentation

## 2023-10-14 LAB — TYPE AND SCREEN
ABO/RH(D): O POS
Antibody Screen: NEGATIVE

## 2023-10-15 DIAGNOSIS — F4312 Post-traumatic stress disorder, chronic: Secondary | ICD-10-CM | POA: Diagnosis not present

## 2023-10-15 DIAGNOSIS — F250 Schizoaffective disorder, bipolar type: Secondary | ICD-10-CM | POA: Diagnosis not present

## 2023-10-15 DIAGNOSIS — F411 Generalized anxiety disorder: Secondary | ICD-10-CM | POA: Diagnosis not present

## 2023-10-16 DIAGNOSIS — F331 Major depressive disorder, recurrent, moderate: Secondary | ICD-10-CM | POA: Diagnosis not present

## 2023-10-19 MED ORDER — ORAL CARE MOUTH RINSE: 15.0000 mL | Freq: Once | OROMUCOSAL | Status: AC

## 2023-10-19 MED ORDER — POVIDONE-IODINE 10 % EX SWAB
2.0000 | Freq: Once | CUTANEOUS | Status: AC
  Administered 2023-10-20: 2 via TOPICAL

## 2023-10-19 MED ORDER — ACETAMINOPHEN 500 MG PO TABS
1000.0000 mg | ORAL_TABLET | ORAL | Status: AC
  Administered 2023-10-20: 1000 mg via ORAL

## 2023-10-19 MED ORDER — LACTATED RINGERS IV SOLN
INTRAVENOUS | Status: DC
Start: 2023-10-19 — End: 2023-10-20

## 2023-10-19 MED ORDER — CEFAZOLIN SODIUM-DEXTROSE 2-4 GM/100ML-% IV SOLN
2.0000 g | INTRAVENOUS | Status: AC
Start: 2023-10-20 — End: 2023-10-21
  Administered 2023-10-20: 2 g via INTRAVENOUS

## 2023-10-19 MED ORDER — CELECOXIB 200 MG PO CAPS
400.0000 mg | ORAL_CAPSULE | ORAL | Status: AC
  Administered 2023-10-20: 400 mg via ORAL

## 2023-10-19 MED ORDER — GABAPENTIN 300 MG PO CAPS
300.0000 mg | ORAL_CAPSULE | ORAL | Status: AC
  Administered 2023-10-20: 300 mg via ORAL

## 2023-10-19 MED ORDER — CHLORHEXIDINE GLUCONATE 0.12 % MT SOLN
15.0000 mL | Freq: Once | OROMUCOSAL | Status: AC
  Administered 2023-10-20: 15 mL via OROMUCOSAL

## 2023-10-20 ENCOUNTER — Other Ambulatory Visit: Payer: Self-pay

## 2023-10-20 ENCOUNTER — Ambulatory Visit

## 2023-10-20 ENCOUNTER — Encounter: Payer: Self-pay | Admitting: Obstetrics and Gynecology

## 2023-10-20 ENCOUNTER — Ambulatory Visit
Admission: RE | Admit: 2023-10-20 | Discharge: 2023-10-20 | Disposition: A | Discharge status: Home / Self Care | Payer: Medicaid Other | Attending: Obstetrics and Gynecology | Admitting: Obstetrics and Gynecology

## 2023-10-20 ENCOUNTER — Encounter
Admission: RE | Disposition: A | Discharge status: Home / Self Care | Payer: Self-pay | Source: Home / Self Care | Attending: Obstetrics and Gynecology

## 2023-10-20 DIAGNOSIS — N83202 Unspecified ovarian cyst, left side: Secondary | ICD-10-CM | POA: Diagnosis not present

## 2023-10-20 DIAGNOSIS — N289 Disorder of kidney and ureter, unspecified: Secondary | ICD-10-CM | POA: Insufficient documentation

## 2023-10-20 DIAGNOSIS — N809 Endometriosis, unspecified: Secondary | ICD-10-CM | POA: Diagnosis not present

## 2023-10-20 DIAGNOSIS — D509 Iron deficiency anemia, unspecified: Secondary | ICD-10-CM | POA: Diagnosis not present

## 2023-10-20 DIAGNOSIS — G8929 Other chronic pain: Secondary | ICD-10-CM | POA: Insufficient documentation

## 2023-10-20 DIAGNOSIS — F331 Major depressive disorder, recurrent, moderate: Secondary | ICD-10-CM | POA: Diagnosis not present

## 2023-10-20 DIAGNOSIS — R102 Pelvic and perineal pain: Secondary | ICD-10-CM

## 2023-10-20 DIAGNOSIS — Z01812 Encounter for preprocedural laboratory examination: Secondary | ICD-10-CM

## 2023-10-20 DIAGNOSIS — N83201 Unspecified ovarian cyst, right side: Secondary | ICD-10-CM | POA: Diagnosis not present

## 2023-10-20 HISTORY — PX: LAPAROSCOPIC VAGINAL HYSTERECTOMY WITH SALPINGO OOPHORECTOMY: SHX6681

## 2023-10-20 LAB — POCT PREGNANCY, URINE: Preg Test, Ur: NEGATIVE

## 2023-10-20 SURGERY — HYSTERECTOMY, VAGINAL, LAPAROSCOPY-ASSISTED, WITH SALPINGO-OOPHORECTOMY
Anesthesia: General

## 2023-10-20 MED ORDER — ROCURONIUM BROMIDE 100 MG/10ML IV SOLN
INTRAVENOUS | Status: DC | PRN
  Administered 2023-10-20 (×2): 50 mg via INTRAVENOUS

## 2023-10-20 MED ORDER — LACTATED RINGERS IV SOLN: INTRAVENOUS | Status: DC

## 2023-10-20 MED ORDER — LIDOCAINE HCL (CARDIAC) PF 100 MG/5ML IV SOSY
PREFILLED_SYRINGE | INTRAVENOUS | Status: DC | PRN
  Administered 2023-10-20: 100 mg via INTRAVENOUS

## 2023-10-20 MED ORDER — LACTATED RINGERS IV SOLN: INTRAVENOUS | Status: DC | PRN

## 2023-10-20 MED ORDER — ONDANSETRON HCL 4 MG/2ML IJ SOLN
INTRAMUSCULAR | Status: AC
  Filled 2023-10-20: qty 2

## 2023-10-20 MED ORDER — DROPERIDOL 2.5 MG/ML IJ SOLN
0.6250 mg | Freq: Once | INTRAMUSCULAR | Status: AC
Start: 2023-10-20 — End: 2023-10-20
  Administered 2023-10-20: 0.625 mg via INTRAVENOUS

## 2023-10-20 MED ORDER — GABAPENTIN 300 MG PO CAPS
ORAL_CAPSULE | ORAL | Status: AC
  Filled 2023-10-20: qty 1

## 2023-10-20 MED ORDER — DEXAMETHASONE SODIUM PHOSPHATE 10 MG/ML IJ SOLN
INTRAMUSCULAR | Status: AC
  Filled 2023-10-20: qty 1

## 2023-10-20 MED ORDER — FENTANYL CITRATE (PF) 100 MCG/2ML IJ SOLN
INTRAMUSCULAR | Status: DC | PRN
  Administered 2023-10-20 (×2): 50 ug via INTRAVENOUS

## 2023-10-20 MED ORDER — IBUPROFEN 800 MG PO TABS: 800.0000 mg | ORAL_TABLET | Freq: Three times a day (TID) | ORAL | 0 refills | Status: DC | PRN

## 2023-10-20 MED ORDER — DEXAMETHASONE SODIUM PHOSPHATE 10 MG/ML IJ SOLN
INTRAMUSCULAR | Status: DC | PRN
Start: 2023-10-20 — End: 2023-10-20
  Administered 2023-10-20: 10 mg via INTRAVENOUS

## 2023-10-20 MED ORDER — MIDAZOLAM HCL 2 MG/2ML IJ SOLN
INTRAMUSCULAR | Status: DC | PRN
  Administered 2023-10-20: 2 mg via INTRAVENOUS

## 2023-10-20 MED ORDER — PROPOFOL 1000 MG/100ML IV EMUL
INTRAVENOUS | Status: AC
  Filled 2023-10-20: qty 100

## 2023-10-20 MED ORDER — DROPERIDOL 2.5 MG/ML IJ SOLN
INTRAMUSCULAR | Status: AC
  Filled 2023-10-20: qty 2

## 2023-10-20 MED ORDER — ACETAMINOPHEN 500 MG PO TABS
ORAL_TABLET | ORAL | Status: AC
  Filled 2023-10-20: qty 2

## 2023-10-20 MED ORDER — SODIUM CHLORIDE (PF) 0.9 % IJ SOLN
INTRAMUSCULAR | Status: AC
  Filled 2023-10-20: qty 50

## 2023-10-20 MED ORDER — MIDAZOLAM HCL 2 MG/2ML IJ SOLN
INTRAMUSCULAR | Status: AC
  Filled 2023-10-20: qty 2

## 2023-10-20 MED ORDER — PROPOFOL 500 MG/50ML IV EMUL
INTRAVENOUS | Status: DC | PRN
  Administered 2023-10-20: 180 ug/kg/min via INTRAVENOUS

## 2023-10-20 MED ORDER — VASOPRESSIN 20 UNIT/ML IV SOLN
INTRAVENOUS | Status: AC
  Filled 2023-10-20: qty 1

## 2023-10-20 MED ORDER — 0.9 % SODIUM CHLORIDE (POUR BTL) OPTIME
TOPICAL | Status: DC | PRN
  Administered 2023-10-20: 1000 mL

## 2023-10-20 MED ORDER — HYDROCODONE-ACETAMINOPHEN 5-325 MG PO TABS
1.0000 | ORAL_TABLET | Freq: Four times a day (QID) | ORAL | 0 refills | Status: DC | PRN
Start: 2023-10-20 — End: 2023-12-02

## 2023-10-20 MED ORDER — BUPIVACAINE HCL 0.5 % IJ SOLN
INTRAMUSCULAR | Status: DC | PRN
  Administered 2023-10-20: 17 mL

## 2023-10-20 MED ORDER — FENTANYL CITRATE (PF) 100 MCG/2ML IJ SOLN
25.0000 ug | INTRAMUSCULAR | Status: DC | PRN
Start: 2023-10-20 — End: 2023-10-20
  Administered 2023-10-20 (×2): 25 ug via INTRAVENOUS

## 2023-10-20 MED ORDER — ROCURONIUM BROMIDE 10 MG/ML (PF) SYRINGE
PREFILLED_SYRINGE | INTRAVENOUS | Status: AC
  Filled 2023-10-20: qty 10

## 2023-10-20 MED ORDER — SUGAMMADEX SODIUM 200 MG/2ML IV SOLN
INTRAVENOUS | Status: DC | PRN
  Administered 2023-10-20: 200 mg via INTRAVENOUS

## 2023-10-20 MED ORDER — GLYCOPYRROLATE 0.2 MG/ML IJ SOLN
INTRAMUSCULAR | Status: DC | PRN
  Administered 2023-10-20: .2 mg via INTRAVENOUS

## 2023-10-20 MED ORDER — CEFAZOLIN SODIUM-DEXTROSE 2-4 GM/100ML-% IV SOLN
INTRAVENOUS | Status: AC
  Filled 2023-10-20: qty 100

## 2023-10-20 MED ORDER — BUPIVACAINE HCL (PF) 0.5 % IJ SOLN
INTRAMUSCULAR | Status: AC
  Filled 2023-10-20: qty 30

## 2023-10-20 MED ORDER — ESTRADIOL 2 MG PO TABS: 2.0000 mg | ORAL_TABLET | Freq: Every day | ORAL | 0 refills | Status: DC

## 2023-10-20 MED ORDER — OXYCODONE HCL 5 MG PO TABS
ORAL_TABLET | ORAL | Status: AC
  Filled 2023-10-20: qty 1

## 2023-10-20 MED ORDER — KETOROLAC TROMETHAMINE 30 MG/ML IJ SOLN
INTRAMUSCULAR | Status: AC
  Filled 2023-10-20: qty 1

## 2023-10-20 MED ORDER — VASOPRESSIN 20 UNIT/ML IV SOLN
INTRAVENOUS | Status: DC | PRN
  Administered 2023-10-20: 18 mL via INTRAMUSCULAR

## 2023-10-20 MED ORDER — LIDOCAINE HCL (PF) 2 % IJ SOLN
INTRAMUSCULAR | Status: AC
  Filled 2023-10-20: qty 5

## 2023-10-20 MED ORDER — CELECOXIB 200 MG PO CAPS
ORAL_CAPSULE | ORAL | Status: AC
  Filled 2023-10-20: qty 2

## 2023-10-20 MED ORDER — GLYCOPYRROLATE 0.2 MG/ML IJ SOLN
INTRAMUSCULAR | Status: AC
Start: 2023-10-20 — End: ?
  Filled 2023-10-20: qty 1

## 2023-10-20 MED ORDER — ONDANSETRON HCL 4 MG/2ML IJ SOLN
INTRAMUSCULAR | Status: DC | PRN
  Administered 2023-10-20: 4 mg via INTRAVENOUS

## 2023-10-20 MED ORDER — SUGAMMADEX SODIUM 200 MG/2ML IV SOLN
INTRAVENOUS | Status: AC
  Filled 2023-10-20: qty 2

## 2023-10-20 MED ORDER — FENTANYL CITRATE (PF) 100 MCG/2ML IJ SOLN
INTRAMUSCULAR | Status: AC
  Filled 2023-10-20: qty 2

## 2023-10-20 MED ORDER — OXYCODONE HCL 5 MG PO TABS
5.0000 mg | ORAL_TABLET | Freq: Once | ORAL | Status: AC | PRN
  Administered 2023-10-20: 5 mg via ORAL

## 2023-10-20 MED ORDER — CHLORHEXIDINE GLUCONATE 0.12 % MT SOLN
OROMUCOSAL | Status: AC
  Filled 2023-10-20: qty 15

## 2023-10-20 MED ORDER — OXYCODONE HCL 5 MG/5ML PO SOLN: 5.0000 mg | Freq: Once | ORAL | Status: AC | PRN

## 2023-10-20 SURGICAL SUPPLY — 46 items
ADHESIVE MASTISOL STRL (MISCELLANEOUS) IMPLANT
BAG URINE DRAIN 2000ML AR STRL (UROLOGICAL SUPPLIES) ×1 IMPLANT
BLADE SURG SZ11 CARB STEEL (BLADE) ×1 IMPLANT
CATH URTH 16FR FL 2W BLN LF (CATHETERS) ×1 IMPLANT
CHLORAPREP W/TINT 26 (MISCELLANEOUS) ×1 IMPLANT
DERMABOND ADVANCED .7 DNX12 (GAUZE/BANDAGES/DRESSINGS) ×1 IMPLANT
ELECT REM PT RETURN 9FT ADLT (ELECTROSURGICAL) ×1 IMPLANT
ELECTRODE REM PT RTRN 9FT ADLT (ELECTROSURGICAL) ×1 IMPLANT
GAUZE 4X4 16PLY ~~LOC~~+RFID DBL (SPONGE) ×1 IMPLANT
GLOVE PI ORTHO PRO STRL 7.5 (GLOVE) ×2 IMPLANT
GOWN STRL REUS W/ TWL LRG LVL3 (GOWN DISPOSABLE) ×1 IMPLANT
GOWN STRL REUS W/ TWL XL LVL3 (GOWN DISPOSABLE) ×2 IMPLANT
HANDLE YANKAUER SUCT BULB TIP (MISCELLANEOUS) IMPLANT
IRRIGATION STRYKERFLOW (MISCELLANEOUS) IMPLANT
IRRIGATOR STRYKERFLOW (MISCELLANEOUS) IMPLANT
IV LACTATED RINGERS 1000ML (IV SOLUTION) ×1 IMPLANT
KIT PINK PAD W/HEAD ARE REST (MISCELLANEOUS) ×1 IMPLANT
KIT PINK PAD W/HEAD ARM REST (MISCELLANEOUS) ×1 IMPLANT
KIT TURNOVER CYSTO (KITS) ×1 IMPLANT
LIGASURE LAP MARYLAND 5MM 37CM (ELECTROSURGICAL) IMPLANT
MANIFOLD NEPTUNE II (INSTRUMENTS) ×1 IMPLANT
NDL SPNL 22GX3.5 QUINCKE BK (NEEDLE) ×1 IMPLANT
NEEDLE SPNL 22GX3.5 QUINCKE BK (NEEDLE) ×1 IMPLANT
NS IRRIG 1000ML POUR BTL (IV SOLUTION) IMPLANT
PACK BASIN MINOR ARMC (MISCELLANEOUS) ×1 IMPLANT
PACK GYN LAPAROSCOPIC (MISCELLANEOUS) ×1 IMPLANT
PAD OB MATERNITY 11 LF (PERSONAL CARE ITEMS) ×1 IMPLANT
PAD PREP OB/GYN DISP 24X41 (PERSONAL CARE ITEMS) ×1 IMPLANT
PENCIL SMOKE EVACUATOR (MISCELLANEOUS) IMPLANT
RETRACTOR PHONTONGUIDE ADAPT (ADAPTER) IMPLANT
SCRUB CHG 4% DYNA-HEX 4OZ (MISCELLANEOUS) ×1 IMPLANT
SLEEVE Z-THREAD 5X100MM (TROCAR) ×2 IMPLANT
SOL ELECTROSURG ANTI STICK (MISCELLANEOUS) IMPLANT
SOLUTION ELECTROSURG ANTI STCK (MISCELLANEOUS) IMPLANT
SPONGE T-LAP 18X18 ~~LOC~~+RFID (SPONGE) IMPLANT
STRIP CLOSURE SKIN 1/2X4 (GAUZE/BANDAGES/DRESSINGS) IMPLANT
SUT VIC AB 0 CT1 27XCR 8 STRN (SUTURE) ×2 IMPLANT
SUT VIC AB 0 CT1 36 (SUTURE) ×1 IMPLANT
SUT VIC AB 4-0 FS2 27 (SUTURE) IMPLANT
SYR 10ML LL (SYRINGE) ×1 IMPLANT
SYR CONTROL 10ML LL (SYRINGE) ×1 IMPLANT
TAPE TRANSPORE STRL 2 31045 (GAUZE/BANDAGES/DRESSINGS) ×1 IMPLANT
TRAP FLUID SMOKE EVACUATOR (MISCELLANEOUS) ×1 IMPLANT
TROCAR Z-THREAD FIOS 5X100MM (TROCAR) ×1 IMPLANT
TUBING EVAC SMOKE HEATED PNEUM (TUBING) ×1 IMPLANT
WATER STERILE IRR 500ML POUR (IV SOLUTION) ×1 IMPLANT

## 2023-10-20 NOTE — Anesthesia Procedure Notes (Signed)
 Procedure Name: Intubation Date/Time: 10/20/2023 7:47 AM  Performed by: Lysbeth Penner, CRNAPre-anesthesia Checklist: Patient identified, Emergency Drugs available, Suction available and Patient being monitored Patient Re-evaluated:Patient Re-evaluated prior to induction Oxygen Delivery Method: Circle system utilized Preoxygenation: Pre-oxygenation with 100% oxygen Induction Type: IV induction Ventilation: Mask ventilation without difficulty Laryngoscope Size: McGrath and 3 Grade View: Grade I Tube type: Oral Tube size: 6.5 mm Number of attempts: 1 Airway Equipment and Method: Stylet and Oral airway Placement Confirmation: ETT inserted through vocal cords under direct vision, positive ETCO2 and breath sounds checked- equal and bilateral Secured at: 20 cm Tube secured with: Tape Dental Injury: Teeth and Oropharynx as per pre-operative assessment

## 2023-10-20 NOTE — Interval H&P Note (Signed)
 History and Physical Interval Note:  10/20/2023 7:34 AM  Anna Flores  has presented today for surgery, with the diagnosis of Chronic pelvic pain-endometriosis.  The various methods of treatment have been discussed with the patient and family. After consideration of risks, benefits and other options for treatment, the patient has consented to  Procedure(s): Laparoscopic Assisted Vaginal Hysterectomy with Bilateral Salpingo-oophorectomy (N/A) as a surgical intervention.  The patient's history has been reviewed, patient examined, no change in status, stable for surgery.  I have reviewed the patient's chart and labs.  Questions were answered to the patient's satisfaction.     Brennan Bailey

## 2023-10-20 NOTE — Anesthesia Postprocedure Evaluation (Signed)
 Anesthesia Post Note  Patient: Anna Flores  Procedure(s) Performed: Laparoscopic Assisted Vaginal Hysterectomy with Bilateral Salpingo-oophorectomy  Patient location during evaluation: PACU Anesthesia Type: General Level of consciousness: awake and alert Pain management: pain level controlled Vital Signs Assessment: post-procedure vital signs reviewed and stable Respiratory status: spontaneous breathing, nonlabored ventilation, respiratory function stable and patient connected to nasal cannula oxygen Cardiovascular status: blood pressure returned to baseline and stable Postop Assessment: no apparent nausea or vomiting Anesthetic complications: no   There were no known notable events for this encounter.   Last Vitals:  Vitals:   10/20/23 1124 10/20/23 1140  BP:  129/86  Pulse: 79 81  Resp: 18 17  Temp: (!) 36.1 C 36.6 C  SpO2: 98% 100%    Last Pain:  Vitals:   10/20/23 1140  TempSrc: Temporal  PainSc: 4                  Cleda Mccreedy Rykin Route

## 2023-10-20 NOTE — Addendum Note (Signed)
 Addendum  created 10/20/23 1321 by Lysbeth Penner, CRNA   Flowsheet accepted

## 2023-10-20 NOTE — Op Note (Signed)
 OPERATIVE NOTE 10/20/2023 9:25 AM  PRE-OPERATIVE DIAGNOSIS:  1) Chronic pelvic pain-endometriosis  POST-OPERATIVE DIAGNOSIS:  1) Same  OPERATION: Procedure(s) (LRB): Laparoscopic Assisted Vaginal Hysterectomy with Bilateral Salpingo-oophorectomy (N/A)    SURGEON(S): Surgeons and Role:    Linzie Collin, MD - Primary    * Hildred Laser, MD - Assisting   ANESTHESIA: Choice  ESTIMATED BLOOD LOSS: 5 mL   SPECIMEN:  ID Type Source Tests Collected by Time Destination  1 : Uterus with cervix and bilateral ovaries Tissue PATH Gyn benign resection SURGICAL PATHOLOGY Linzie Collin, MD 10/20/2023 [phone]     COMPLICATIONS: None  DRAINS: Foley to gravity  DISPOSITION: Stable to recovery room  DESCRIPTION OF PROCEDURE:      The patient was prepped and draped in the dorsolithotomy position and placed under general anesthesia. The bladder was emptied. The cervix was grasped with a multi-toothed tenaculum and a uterine manipulator was placed within the cervical os respecting the position and curvature of the uterus. After changing gloves we proceeded abdominally. A small infraumbilical incision was made and a 5 mm trocar port was placed within the abdominopelvic cavity. The opening pressure was less than 7 mmHg.  Approximately 3 and 1/2 L of carbon dioxide gas was instilled within the abdominal pelvic cavity. The laparoscope was placed and the pelvis and abdomen were carefully inspected. In the usual manner, under direct visualization right and left lower quadrant ports of 5 mm size were placed. Both ureters were identified in the pelvis prior to dissection or clamping and cutting of pedicles. The infundibulopelvic ligaments were carefully identified. The ureters were again identified out of the operative field. The ligaments were triply coagulated and divided. Hemostasis of the pedicles was noted.  The round ligaments were coagulated and divided and a bladder flap was created. The  upper aspect of the broad ligament was clamped coagulated and divided. The uterine arteries were skeletonized, triply coagulated and divided. Careful inspection of all pedicles and the remainder of the pelvis was performed. Hemostasis was noted. The lower quadrant ports were removed, hemostasis of the port sites was noted, and the incisions were closed in subcuticular manner. The laparoscope and trocar sleeve were removed from the infraumbilical incision, hemostasis was noted, and the incision was closed in a subcuticular manner. A long-acting anesthetic was employed in the skin incisions. We then proceeded vaginally. A weighted speculum was placed posteriorly. A multi-toothed tenaculum was used to grasp the cervix and the cervix was injected in a circumferential manner with a dilute Pitressin solution. An incision was made around the cervix and the vaginal mucosa was dissected off of the cervix. The posterior cul-de-sac was identified and entered and the weighted speculum was placed within this. The anterior cul-de-sac was identified and entered and a retractor was placed and used to retract the bladder anteriorly keeping it out of the operative field. The uterosacral ligaments were clamped divided and suture ligated. The cardinal ligaments were clamped divided and suture ligated. The small remaining pedicle was clamped divided and suture ligated bilaterally allowing delivery of the specimen. Angle sutures were placed in the usual manner. A culdoplasty was performed. The peritoneum was identified anteriorly and then incorporating the left upper pedicle left lower pedicle right lower pedicle right upper pedicle and anterior peritoneum a pursestring suture was placed exteriorizing all pedicles. Hemostasis of all pedicles was noted at this time. The vaginal mucosa was then closed with a running suture of Vicryl. The patient went to  recovery room in stable condition. Clear urine was noted in the Foley at the  conclusion of the procedure. Dr. Valentino Saxon provided exposure, dissection, suctioning, retraction, and general support and assistance during the procedure.   Elonda Husky, M.D. 10/20/2023 9:25 AM

## 2023-10-20 NOTE — Addendum Note (Signed)
 Addendum  created 10/20/23 1224 by Lysbeth Penner, CRNA   Intraprocedure Event edited

## 2023-10-20 NOTE — Transfer of Care (Signed)
 Immediate Anesthesia Transfer of Care Note  Patient: Anna Flores  Procedure(s) Performed: Laparoscopic Assisted Vaginal Hysterectomy with Bilateral Salpingo-oophorectomy  Patient Location: PACU  Anesthesia Type:General  Level of Consciousness: sedated  Airway & Oxygen Therapy: Patient Spontanous Breathing and Patient connected to face mask oxygen  Post-op Assessment: Report given to RN and Post -op Vital signs reviewed and stable  Post vital signs: Reviewed and stable  Last Vitals:  Vitals Value Taken Time  BP 115/55 10/20/23 0936  Temp    Pulse 81 10/20/23 0940  Resp 18 10/20/23 0940  SpO2 100 % 10/20/23 0940  Vitals shown include unfiled device data.  Last Pain:  Vitals:   10/20/23 0627  TempSrc: Temporal  PainSc: 0-No pain         Complications: There were no known notable events for this encounter.

## 2023-10-20 NOTE — Anesthesia Preprocedure Evaluation (Addendum)
 Anesthesia Evaluation  Patient identified by MRN, date of birth, ID band Patient awake    Reviewed: Allergy & Precautions, NPO status , Patient's Chart, lab work & pertinent test results  History of Anesthesia Complications (+) PONV and history of anesthetic complications  Airway Mallampati: III  TM Distance: <3 FB Neck ROM: full    Dental  (+) Chipped, Caps   Pulmonary neg pulmonary ROS, neg shortness of breath   Pulmonary exam normal        Cardiovascular Exercise Tolerance: Good (-) angina (-) Past MI negative cardio ROS Normal cardiovascular exam     Neuro/Psych  PSYCHIATRIC DISORDERS      negative neurological ROS     GI/Hepatic negative GI ROS, Neg liver ROS,,,  Endo/Other  negative endocrine ROS    Renal/GU Renal disease     Musculoskeletal   Abdominal   Peds  Hematology negative hematology ROS (+)   Anesthesia Other Findings Past Medical History: 10/26/2013: Abdominal pain 2013: Anemia No date: Anxiety 02/28/2020: Back pain affecting pregnancy in third trimester No date: Bipolar disorder (HCC) No date: Depression 10/02/2016: History of Papanicolaou smear of cervix     Comment:  NEG 02/29/2020: IDA (iron deficiency anemia) 03/07/2020: Indication for care in labor and delivery, antepartum 01/19/2021: Mastitis     Comment:  right 10/26/2013: Menometrorrhagia No date: PONV (postoperative nausea and vomiting) 02/01/2021: Postpartum galactocele No date: Pregnancy     Comment:  DELIVERED 08/12/16 - JEG  Past Surgical History: 2018: DIAGNOSTIC LAPAROSCOPY     Comment:  suspected ectopic pregnancy-[facility] 06/11/2021: DILATION AND EVACUATION; N/A     Comment:  Procedure: D & C;  Surgeon: Conard Novak, MD;                Location: [facility] OR[facility];  Service: Gynecology;  Laterality:               N/A; 02/06/2021: INCISION AND DRAINAGE ABSCESS; Right     Comment:  Procedure: INCISION AND DRAINAGE ABSCESS;   Surgeon:               Leafy Ro, MD;  Location: [facility];  Service:               General;  Laterality: Right; 06/11/2021: LAPAROSCOPY; N/A     Comment:  Procedure: LAPAROSCOPY DIAGNOSTIC;  Surgeon: Conard Novak, MD;  Location: [facility];  Service: Gynecology;              Laterality: N/A; No date: TONSILLECTOMY 07/24/2021: XI ROBOTIC ASSISTED SALPINGECTOMY; Bilateral     Comment:  Procedure: XI ROBOTIC ASSISTED SALPINGECTOMY;  Surgeon:               [name], MD;  Location: [facility];  Service:              Gynecology;  Laterality: Bilateral;  BMI    Body Mass Index: 38.45 kg/m      Reproductive/Obstetrics negative OB ROS                             Anesthesia Physical Anesthesia Plan  ASA: 3  Anesthesia Plan: General ETT   Post-op Pain Management:    Induction: Intravenous  PONV Risk Score and Plan: Ondansetron, Dexamethasone, Midazolam and Treatment may vary due to age or medical condition  Airway Management Planned: Oral ETT  Additional Equipment:   Intra-op Plan:   Post-operative Plan: Extubation in OR  Informed Consent: I have reviewed the patients History and Physical, chart, labs and discussed the procedure including the risks, benefits and alternatives for the proposed anesthesia with the patient or authorized representative who has indicated his/her understanding and acceptance.     Dental Advisory Given  Plan Discussed with: Anesthesiologist, CRNA and Surgeon  Anesthesia Plan Comments: (Patient reports that she has had dental work done to her front incisor from it cracking. She was consented that we would be as careful as we could with it but that in these scenarios there is always the risk that the damage could get worse or reoccur. She voiced assent.  Patient consented for risks of anesthesia including but not limited to:  - adverse reactions to medications - damage to eyes, teeth, lips or  other oral mucosa - nerve damage due to positioning  - sore throat or hoarseness - Damage to heart, brain, nerves, lungs, other parts of body or loss of life  Patient voiced understanding and assent.)       Anesthesia Quick Evaluation

## 2023-10-21 ENCOUNTER — Encounter: Payer: Self-pay | Admitting: Obstetrics and Gynecology

## 2023-10-21 DIAGNOSIS — F331 Major depressive disorder, recurrent, moderate: Secondary | ICD-10-CM | POA: Diagnosis not present

## 2023-10-22 ENCOUNTER — Other Ambulatory Visit: Payer: Self-pay | Admitting: Obstetrics and Gynecology

## 2023-10-22 ENCOUNTER — Telehealth: Payer: Self-pay

## 2023-10-22 DIAGNOSIS — R102 Pelvic and perineal pain: Secondary | ICD-10-CM

## 2023-10-22 MED ORDER — OXYCODONE-ACETAMINOPHEN 5-325 MG PO TABS: 1.0000 | ORAL_TABLET | Freq: Four times a day (QID) | ORAL | 0 refills | Status: DC | PRN

## 2023-10-22 NOTE — Telephone Encounter (Signed)
 Spoke with patient regarding pain concerns. She states mainly the medication only benefits her for 1 hour and then she has terrible pain in her belly button. After speaking with Dr. Logan Bores, he has agreed to send in additional pain medication short term. Patient has been made aware and is thankful. Advised to call back with any additional concerns.

## 2023-10-22 NOTE — Telephone Encounter (Signed)
 Pt left msg on triage for Dr. Logan Bores. Had surgery on Monday 3/17 and the HYDROcodone-acetaminophen (NORCO/VICODIN) 5-325 MG tablet  & ibuprofen (ADVIL) 800 MG tablet are not managing her pain well. She is asking if there is another dose of HYDROcodone-acetaminophen (NORCO/VICODIN) 5-325 MG tablet that could be called in.

## 2023-10-23 DIAGNOSIS — F250 Schizoaffective disorder, bipolar type: Secondary | ICD-10-CM | POA: Diagnosis not present

## 2023-10-23 DIAGNOSIS — F3112 Bipolar disorder, current episode manic without psychotic features, moderate: Secondary | ICD-10-CM | POA: Diagnosis not present

## 2023-10-23 DIAGNOSIS — F4312 Post-traumatic stress disorder, chronic: Secondary | ICD-10-CM | POA: Diagnosis not present

## 2023-10-23 DIAGNOSIS — F411 Generalized anxiety disorder: Secondary | ICD-10-CM | POA: Diagnosis not present

## 2023-10-23 LAB — SURGICAL PATHOLOGY

## 2023-10-28 ENCOUNTER — Encounter: Payer: Self-pay | Admitting: Obstetrics and Gynecology

## 2023-10-28 ENCOUNTER — Ambulatory Visit (INDEPENDENT_AMBULATORY_CARE_PROVIDER_SITE_OTHER): Payer: Medicaid Other | Admitting: Obstetrics and Gynecology

## 2023-10-28 VITALS — BP 116/80 | HR 93 | Ht 64.0 in | Wt 226.9 lb

## 2023-10-28 DIAGNOSIS — R300 Dysuria: Secondary | ICD-10-CM | POA: Diagnosis not present

## 2023-10-28 DIAGNOSIS — Z9889 Other specified postprocedural states: Secondary | ICD-10-CM

## 2023-10-28 DIAGNOSIS — Z4889 Encounter for other specified surgical aftercare: Secondary | ICD-10-CM | POA: Diagnosis not present

## 2023-10-28 LAB — POCT URINALYSIS DIPSTICK
Bilirubin, UA: NEGATIVE
Glucose, UA: NEGATIVE
Ketones, UA: NEGATIVE
Nitrite, UA: NEGATIVE
Protein, UA: NEGATIVE
Spec Grav, UA: 1.01 (ref 1.010–1.025)
Urobilinogen, UA: 0.2 U/dL
pH, UA: 5 (ref 5.0–8.0)

## 2023-10-28 MED ORDER — NITROFURANTOIN MONOHYD MACRO 100 MG PO CAPS: 100.0000 mg | ORAL_CAPSULE | Freq: Two times a day (BID) | ORAL | 0 refills | Status: DC

## 2023-10-28 NOTE — Progress Notes (Signed)
 Patient presents for 1 week postop follow-up following LAVH w/ BSO. She states still having gas pains from surgery mainly in her shoulder now, reports some incision soreness. Complaints of burning with urination, UA ordered. She has been taking Gas X and Colace for BM
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
## 2023-10-30 DIAGNOSIS — F331 Major depressive disorder, recurrent, moderate: Secondary | ICD-10-CM | POA: Diagnosis not present

## 2023-10-31 LAB — URINE CULTURE

## 2023-11-03 ENCOUNTER — Other Ambulatory Visit: Payer: Self-pay

## 2023-11-03 DIAGNOSIS — B952 Enterococcus as the cause of diseases classified elsewhere: Secondary | ICD-10-CM

## 2023-11-03 DIAGNOSIS — N390 Urinary tract infection, site not specified: Secondary | ICD-10-CM

## 2023-11-03 DIAGNOSIS — A498 Other bacterial infections of unspecified site: Secondary | ICD-10-CM

## 2023-11-03 MED ORDER — SULFAMETHOXAZOLE-TRIMETHOPRIM 800-160 MG PO TABS
1.0000 | ORAL_TABLET | Freq: Two times a day (BID) | ORAL | 1 refills | Status: DC
Start: 2023-11-03 — End: 2023-11-11

## 2023-11-04 DIAGNOSIS — F3112 Bipolar disorder, current episode manic without psychotic features, moderate: Secondary | ICD-10-CM | POA: Diagnosis not present

## 2023-11-04 DIAGNOSIS — F250 Schizoaffective disorder, bipolar type: Secondary | ICD-10-CM | POA: Diagnosis not present

## 2023-11-04 DIAGNOSIS — F411 Generalized anxiety disorder: Secondary | ICD-10-CM | POA: Diagnosis not present

## 2023-11-04 DIAGNOSIS — F4312 Post-traumatic stress disorder, chronic: Secondary | ICD-10-CM | POA: Diagnosis not present

## 2023-11-05 ENCOUNTER — Ambulatory Visit: Payer: Self-pay

## 2023-11-05 ENCOUNTER — Other Ambulatory Visit: Payer: Self-pay

## 2023-11-05 ENCOUNTER — Encounter: Payer: Self-pay | Admitting: Obstetrics and Gynecology

## 2023-11-05 ENCOUNTER — Emergency Department

## 2023-11-05 ENCOUNTER — Emergency Department
Admission: EM | Admit: 2023-11-05 | Discharge: 2023-11-06 | Disposition: A | Discharge status: Home / Self Care | Attending: Emergency Medicine | Admitting: Emergency Medicine

## 2023-11-05 DIAGNOSIS — R300 Dysuria: Secondary | ICD-10-CM | POA: Diagnosis not present

## 2023-11-05 DIAGNOSIS — R112 Nausea with vomiting, unspecified: Secondary | ICD-10-CM | POA: Diagnosis not present

## 2023-11-05 DIAGNOSIS — R1033 Periumbilical pain: Secondary | ICD-10-CM | POA: Diagnosis not present

## 2023-11-05 DIAGNOSIS — R110 Nausea: Secondary | ICD-10-CM | POA: Diagnosis not present

## 2023-11-05 DIAGNOSIS — R1031 Right lower quadrant pain: Secondary | ICD-10-CM | POA: Diagnosis not present

## 2023-11-05 DIAGNOSIS — R109 Unspecified abdominal pain: Secondary | ICD-10-CM

## 2023-11-05 LAB — CBC
HCT: 38 % (ref 36.0–46.0)
Hemoglobin: 13.1 g/dL (ref 12.0–15.0)
MCH: 29.9 pg (ref 26.0–34.0)
MCHC: 34.5 g/dL (ref 30.0–36.0)
MCV: 86.8 fL (ref 80.0–100.0)
Platelets: 325 10*3/uL (ref 150–400)
RBC: 4.38 MIL/uL (ref 3.87–5.11)
RDW: 12.5 % (ref 11.5–15.5)
WBC: 10.8 10*3/uL — ABNORMAL HIGH (ref 4.0–10.5)
nRBC: 0 % (ref 0.0–0.2)

## 2023-11-05 LAB — URINALYSIS, ROUTINE W REFLEX MICROSCOPIC
Bilirubin Urine: NEGATIVE
Glucose, UA: NEGATIVE mg/dL
Ketones, ur: NEGATIVE mg/dL
Nitrite: NEGATIVE
Protein, ur: NEGATIVE mg/dL
Specific Gravity, Urine: 1.003 — ABNORMAL LOW (ref 1.005–1.030)
pH: 6 (ref 5.0–8.0)

## 2023-11-05 LAB — LACTIC ACID, PLASMA: Lactic Acid, Venous: 1.3 mmol/L (ref 0.5–1.9)

## 2023-11-05 LAB — COMPREHENSIVE METABOLIC PANEL WITH GFR
ALT: 16 U/L (ref 0–44)
AST: 17 U/L (ref 15–41)
Albumin: 4.7 g/dL (ref 3.5–5.0)
Alkaline Phosphatase: 74 U/L (ref 38–126)
Anion gap: 10 (ref 5–15)
BUN: 8 mg/dL (ref 6–20)
CO2: 23 mmol/L (ref 22–32)
Calcium: 9.5 mg/dL (ref 8.9–10.3)
Chloride: 103 mmol/L (ref 98–111)
Creatinine, Ser: 1.01 mg/dL — ABNORMAL HIGH (ref 0.44–1.00)
GFR, Estimated: >60 mL/min (ref >60)
Glucose, Bld: 99 mg/dL (ref 70–99)
Potassium: 3.6 mmol/L (ref 3.5–5.1)
Sodium: 136 mmol/L (ref 135–145)
Total Bilirubin: 1.1 mg/dL (ref 0.0–1.2)
Total Protein: 8.1 g/dL (ref 6.5–8.1)

## 2023-11-05 LAB — LIPASE, BLOOD: Lipase: 46 U/L (ref 11–51)

## 2023-11-05 MED ORDER — IOHEXOL 300 MG/ML  SOLN
100.0000 mL | Freq: Once | INTRAMUSCULAR | Status: AC | PRN
  Administered 2023-11-05: 100 mL via INTRAVENOUS

## 2023-11-05 MED ORDER — SODIUM CHLORIDE 0.9 % IV BOLUS
1000.0000 mL | Freq: Once | INTRAVENOUS | Status: AC
  Administered 2023-11-05: 1000 mL via INTRAVENOUS

## 2023-11-05 MED ORDER — OXYCODONE-ACETAMINOPHEN 5-325 MG PO TABS: 1.0000 | ORAL_TABLET | Freq: Four times a day (QID) | ORAL | 0 refills | Status: DC | PRN

## 2023-11-05 MED ORDER — SULFAMETHOXAZOLE-TRIMETHOPRIM 800-160 MG PO TABS: 1.0000 | ORAL_TABLET | Freq: Two times a day (BID) | ORAL | 0 refills | Status: DC

## 2023-11-05 MED ORDER — ONDANSETRON HCL 4 MG/2ML IJ SOLN
4.0000 mg | Freq: Once | INTRAMUSCULAR | Status: AC
  Administered 2023-11-05: 4 mg via INTRAVENOUS
  Filled 2023-11-05: qty 2

## 2023-11-05 MED ORDER — MORPHINE SULFATE (PF) 4 MG/ML IV SOLN
4.0000 mg | Freq: Once | INTRAVENOUS | Status: AC
  Administered 2023-11-05: 4 mg via INTRAVENOUS
  Filled 2023-11-05: qty 1

## 2023-11-05 MED ORDER — HYDROMORPHONE HCL 1 MG/ML IJ SOLN
0.5000 mg | Freq: Once | INTRAMUSCULAR | Status: AC
  Administered 2023-11-05: 0.5 mg via INTRAVENOUS
  Filled 2023-11-05: qty 0.5

## 2023-11-05 NOTE — Telephone Encounter (Signed)
 Spoke to Dr. Logan Bores and called patient. He recommended patient start AZO over the counter to assist with additional pain. She states she will do this and call back tomorrow if she remains to have issue.

## 2023-11-05 NOTE — Discharge Instructions (Addendum)
 You were seen in the ER for your abdominal pain and nausea.  Your testing here was fortunately overall reassuring.  I do recommend extending your antibiotic course to 14 days.  Have sent an additional 7 days to your pharmacy to take when you have finished your current course.  Use your nausea medication as needed.  I have also sent a short course of pain medication for breakthrough pain.  Follow-up with your surgeon for further evaluation.  Return to the ER for new or worsening symptoms.

## 2023-11-05 NOTE — Telephone Encounter (Signed)
  Chief Complaint: post surgical pain Symptoms: pain Frequency: constant Pertinent Negatives: Patient denies fever Disposition: [] ED /[] Urgent Care (no appt availability in office) / [] Appointment(In office/virtual)/ []  [facility]/ [] Home Care/ [] Refused Recommended Disposition /[] [facility]/ [x]  Follow-up with surgeon Additional Notes:   Hysterectomy on 10/20/23. Had one week post op follow up appointment with OB on 3/25, during that visit she reported pelvic pain, diagnosed with bladder infection, started medication. Calling today because she is still having right sided flank pain and dysuria. Sharp pain around umbilicus. She has contacted OB but feels she is not getting any help, requesting recommendations by Dr. Caralee Ates. Advised she will likely need to contact surgeon for post op symptoms, called to [facility] and spoke with Cassandra, explained situation, verified patient will need follow up with surgeon. Genessa aware she will need to follow up with OB/Surgeon. Advised her to call back with new symptoms unrelated to surgery, with questions, or if unable to connect with OB. Educated on care advice as documented in protocol, patient verbalized understanding.    Copied from CRM [id number]. Topic: Clinical - Red Word Triage >> Nov 05, 2023  2:30 PM Geneva B wrote: Red Word that prompted transfer to Nurse Triage: patient had a hysterectomy on 03/17 and patient is having a lot of pain in her side what's to ask some questions to nurse Reason for Disposition  [1] MILD-MODERATE post-op pain (e.g., pain scale 1-7) AND [2] not controlled with pain medications  Protocols used: Post-Op Symptoms and Questions-A-AH

## 2023-11-05 NOTE — ED Triage Notes (Signed)
 Pt reports she had a hysterectomy on 3/17 and was dx with a bladder infection on 3/25, pt was given rx for Septra that she has been taking since Monday. Pt reports a few days ago she developed pain near her umbilicus that radiates around right side into her back, and today n/v. Pt also reports dysuria.

## 2023-11-05 NOTE — ED Provider Notes (Signed)
 [facility] Provider Note    Event Date/Time   First MD Initiated Contact with Patient 11/05/23 2215     (approximate)   History   Abdominal Pain   HPI  Anna Flores is a 33 year old female with history of endometriosis s/p hysterectomy on 3/17 presenting to the emergency department for evaluation of abdominal pain.  At the postop visit on 3/25, she was noted to have a UTI for which she was placed on antibiotics.  Her urine grew Klebsiella resistant to her initial antibiotic, so she was placed on Bactrim 3 days ago which she has been taking.  However, over the past few days she has had worsening pain near her umbilicus radiating to her right flank with associated nausea without vomiting.  Reports continued dysuria.     Physical Exam   Triage Vital Signs: ED Triage Vitals  Encounter Vitals Group     BP 11/05/23 2111 137/79     Systolic BP Percentile --      Diastolic BP Percentile --      Pulse Rate 11/05/23 2111 75     Resp 11/05/23 2111 16     Temp 11/05/23 2111 98.4 F (36.9 C)     Temp Source 11/05/23 2111 Oral     SpO2 11/05/23 2111 100 %     Weight 11/05/23 2110 226 lb 14.4 oz (102.9 kg)     Height 11/05/23 2110 5\' 4"  (1.626 m)     Head Circumference --      Peak Flow --      Pain Score 11/05/23 2110 8     Pain Loc --      Pain Education --      Exclude from Growth Chart --     Most recent vital signs: Vitals:   11/05/23 2111  BP: 137/79  Pulse: 75  Resp: 16  Temp: 98.4 F (36.9 C)  SpO2: 100%     General: Awake, interactive  CV:  Regular rate, good peripheral perfusion.  Resp:  Unlabored respirations Abd:  Nondistended, soft, tender palpation most notably over the right side of the abdomen without rebound or guarding Neuro:  Symmetric facial movement, fluid speech   ED Results / Procedures / Treatments   Labs (all labs ordered are listed, but only abnormal results are displayed) Labs Reviewed  COMPREHENSIVE METABOLIC  PANEL WITH GFR - Abnormal; Notable for the following components:      Result Value   Creatinine, Ser 1.01 (*)    All other components within normal limits  CBC - Abnormal; Notable for the following components:   WBC 10.8 (*)    All other components within normal limits  URINALYSIS, ROUTINE W REFLEX MICROSCOPIC - Abnormal; Notable for the following components:   Color, Urine STRAW (*)    APPearance CLEAR (*)    Specific Gravity, Urine 1.003 (*)    Hgb urine dipstick MODERATE (*)    Leukocytes,Ua MODERATE (*)    Bacteria, UA RARE (*)    All other components within normal limits  LIPASE, BLOOD  LACTIC ACID, PLASMA     EKG EKG independently reviewed interpreted by myself (ER attending) demonstrates:    RADIOLOGY Imaging independently reviewed and interpreted by myself demonstrates:  CT abdomen pelvis without acute findings  PROCEDURES:  Critical Care performed: No  Procedures   MEDICATIONS ORDERED IN ED: Medications  HYDROmorphone (DILAUDID) injection 0.5 mg (has no administration in time range)  ondansetron (ZOFRAN) injection 4 mg (4 mg Intravenous  Given 11/05/23 2235)  morphine (PF) 4 MG/ML injection 4 mg (4 mg Intravenous Given 11/05/23 2235)  sodium chloride 0.9 % bolus 1,000 mL (1,000 mLs Intravenous New Bag/Given 11/05/23 2236)  iohexol (OMNIPAQUE) 300 MG/ML solution 100 mL (100 mLs Intravenous Contrast Given 11/05/23 2251)     IMPRESSION / MDM / ASSESSMENT AND PLAN / ED COURSE  I reviewed the triage vital signs and the nursing notes.  Differential diagnosis includes, but is not limited to, pyelonephritis, renal stone, postop complication, appendicitis  Patient's presentation is most consistent with acute presentation with potential threat to life or bodily function.  33 year old female presenting to the emergency department for evaluation of abdominal pain.  Stable vitals on presentation.  Labs with mild leukocytosis and mildly elevated creatinine.  Urine remains  somewhat concerning for infection but difficult to interpret in the setting of incomplete treatment.  CT resulted without acute findings.  Clinical history is somewhat concerning for ascending urinary infection as the patient did have a UTI that was initially being treated with an antibiotic she was resistant to.  With this, I did recommend extending her antibiotic course to 2 weeks.  After initial treatment with morphine and Zofran, patient did have some ongoing pain.  Will trial further pain control.  If she feels improved and is tolerating p.o., suspect she will be stable for discharge with outpatient follow-up.  Signed out to oncoming physician pending reevaluation and disposition.      FINAL CLINICAL IMPRESSION(S) / ED DIAGNOSES   Final diagnoses:  Right sided abdominal pain  Nausea     Rx / DC Orders   ED Discharge Orders          Ordered    sulfamethoxazole-trimethoprim (BACTRIM DS) 800-160 MG tablet  2 times daily        11/05/23 2323    oxyCODONE-acetaminophen (PERCOCET) 5-325 MG tablet  Every 6 hours PRN        11/05/23 2323             Note:  This document was prepared using Dragon voice recognition software and may include unintentional dictation errors.   Trinna Post, MD 11/05/23 [phone]

## 2023-11-05 NOTE — ED Provider Notes (Signed)
-----------------------------------------   11:59 PM on 11/05/2023 ----------------------------------------- Patient states she is feeling much better.  Reassuring workup including largely normal CBC chemistry urinalysis does show mild infection patient currently taking Bactrim which has been extended to a full 2-week course.  CT scan with contrast shows no complicating feature from her recent hysterectomy no acute finding.  Patient states she is feeling much better after pain control.  Will discharge with a short course of pain medication patient will continue taking her antibiotic and follow-up with Dr. Logan Bores of OB.  Patient agreeable to plan.   Minna Antis, MD 11/05/23 [phone]

## 2023-11-08 ENCOUNTER — Encounter: Payer: Self-pay | Admitting: Emergency Medicine

## 2023-11-08 ENCOUNTER — Emergency Department

## 2023-11-08 ENCOUNTER — Other Ambulatory Visit: Payer: Self-pay

## 2023-11-08 ENCOUNTER — Inpatient Hospital Stay
Admission: EM | Admit: 2023-11-08 | Discharge: 2023-11-11 | DRG: 690 | Disposition: A | Discharge status: Home / Self Care | Attending: Student | Admitting: Student

## 2023-11-08 DIAGNOSIS — E860 Dehydration: Secondary | ICD-10-CM | POA: Diagnosis present

## 2023-11-08 DIAGNOSIS — F418 Other specified anxiety disorders: Secondary | ICD-10-CM | POA: Diagnosis present

## 2023-11-08 DIAGNOSIS — X58XXXA Exposure to other specified factors, initial encounter: Secondary | ICD-10-CM | POA: Diagnosis present

## 2023-11-08 DIAGNOSIS — E669 Obesity, unspecified: Secondary | ICD-10-CM | POA: Diagnosis not present

## 2023-11-08 DIAGNOSIS — E66812 Obesity, class 2: Secondary | ICD-10-CM | POA: Diagnosis present

## 2023-11-08 DIAGNOSIS — F419 Anxiety disorder, unspecified: Secondary | ICD-10-CM | POA: Diagnosis present

## 2023-11-08 DIAGNOSIS — S22089A Unspecified fracture of T11-T12 vertebra, initial encounter for closed fracture: Secondary | ICD-10-CM | POA: Diagnosis present

## 2023-11-08 DIAGNOSIS — E559 Vitamin D deficiency, unspecified: Secondary | ICD-10-CM | POA: Diagnosis present

## 2023-11-08 DIAGNOSIS — E876 Hypokalemia: Secondary | ICD-10-CM | POA: Diagnosis not present

## 2023-11-08 DIAGNOSIS — N10 Acute tubulo-interstitial nephritis: Principal | ICD-10-CM | POA: Diagnosis present

## 2023-11-08 DIAGNOSIS — N12 Tubulo-interstitial nephritis, not specified as acute or chronic: Secondary | ICD-10-CM | POA: Diagnosis not present

## 2023-11-08 DIAGNOSIS — Z79899 Other long term (current) drug therapy: Secondary | ICD-10-CM

## 2023-11-08 DIAGNOSIS — F319 Bipolar disorder, unspecified: Secondary | ICD-10-CM | POA: Diagnosis not present

## 2023-11-08 DIAGNOSIS — Z90710 Acquired absence of both cervix and uterus: Secondary | ICD-10-CM

## 2023-11-08 DIAGNOSIS — R160 Hepatomegaly, not elsewhere classified: Secondary | ICD-10-CM | POA: Diagnosis not present

## 2023-11-08 DIAGNOSIS — Z885 Allergy status to narcotic agent status: Secondary | ICD-10-CM

## 2023-11-08 DIAGNOSIS — Z888 Allergy status to other drugs, medicaments and biological substances status: Secondary | ICD-10-CM

## 2023-11-08 DIAGNOSIS — K760 Fatty (change of) liver, not elsewhere classified: Secondary | ICD-10-CM | POA: Diagnosis present

## 2023-11-08 DIAGNOSIS — Y929 Unspecified place or not applicable: Secondary | ICD-10-CM

## 2023-11-08 DIAGNOSIS — Z6838 Body mass index (BMI) 38.0-38.9, adult: Secondary | ICD-10-CM

## 2023-11-08 DIAGNOSIS — I10 Essential (primary) hypertension: Secondary | ICD-10-CM | POA: Diagnosis not present

## 2023-11-08 DIAGNOSIS — R1031 Right lower quadrant pain: Secondary | ICD-10-CM | POA: Diagnosis not present

## 2023-11-08 DIAGNOSIS — Z880 Allergy status to penicillin: Secondary | ICD-10-CM

## 2023-11-08 DIAGNOSIS — R1030 Lower abdominal pain, unspecified: Secondary | ICD-10-CM | POA: Diagnosis not present

## 2023-11-08 DIAGNOSIS — N179 Acute kidney failure, unspecified: Secondary | ICD-10-CM | POA: Diagnosis not present

## 2023-11-08 LAB — URINALYSIS, ROUTINE W REFLEX MICROSCOPIC
Bilirubin Urine: NEGATIVE
Glucose, UA: NEGATIVE mg/dL
Ketones, ur: NEGATIVE mg/dL
Nitrite: POSITIVE — AB
Protein, ur: NEGATIVE mg/dL
Specific Gravity, Urine: 1.005 (ref 1.005–1.030)
pH: 5 (ref 5.0–8.0)

## 2023-11-08 LAB — CBC WITH DIFFERENTIAL/PLATELET
Abs Immature Granulocytes: 0.03 10*3/uL (ref 0.00–0.07)
Basophils Absolute: 0 10*3/uL (ref 0.0–0.1)
Basophils Relative: 0 %
Eosinophils Absolute: 0.1 10*3/uL (ref 0.0–0.5)
Eosinophils Relative: 2 %
HCT: 36.8 % (ref 36.0–46.0)
Hemoglobin: 12.6 g/dL (ref 12.0–15.0)
Immature Granulocytes: 0 %
Lymphocytes Relative: 39 %
Lymphs Abs: 3.4 10*3/uL (ref 0.7–4.0)
MCH: 30 pg (ref 26.0–34.0)
MCHC: 34.2 g/dL (ref 30.0–36.0)
MCV: 87.6 fL (ref 80.0–100.0)
Monocytes Absolute: 0.5 10*3/uL (ref 0.1–1.0)
Monocytes Relative: 5 %
Neutro Abs: 4.8 10*3/uL (ref 1.7–7.7)
Neutrophils Relative %: 54 %
Platelets: 330 10*3/uL (ref 150–400)
RBC: 4.2 MIL/uL (ref 3.87–5.11)
RDW: 12.4 % (ref 11.5–15.5)
WBC: 8.8 10*3/uL (ref 4.0–10.5)
nRBC: 0 % (ref 0.0–0.2)

## 2023-11-08 LAB — COMPREHENSIVE METABOLIC PANEL WITH GFR
ALT: 18 U/L (ref 0–44)
AST: 19 U/L (ref 15–41)
Albumin: 4.6 g/dL (ref 3.5–5.0)
Alkaline Phosphatase: 68 U/L (ref 38–126)
Anion gap: 11 (ref 5–15)
BUN: 13 mg/dL (ref 6–20)
CO2: 22 mmol/L (ref 22–32)
Calcium: 9.5 mg/dL (ref 8.9–10.3)
Chloride: 101 mmol/L (ref 98–111)
Creatinine, Ser: 1.25 mg/dL — ABNORMAL HIGH (ref 0.44–1.00)
GFR, Estimated: 59 mL/min — ABNORMAL LOW (ref >60)
Glucose, Bld: 110 mg/dL — ABNORMAL HIGH (ref 70–99)
Potassium: 3.3 mmol/L — ABNORMAL LOW (ref 3.5–5.1)
Sodium: 134 mmol/L — ABNORMAL LOW (ref 135–145)
Total Bilirubin: 0.9 mg/dL (ref 0.0–1.2)
Total Protein: 7.7 g/dL (ref 6.5–8.1)

## 2023-11-08 LAB — MAGNESIUM: Magnesium: 2.3 mg/dL (ref 1.7–2.4)

## 2023-11-08 LAB — PHOSPHORUS: Phosphorus: 2.7 mg/dL (ref 2.5–4.6)

## 2023-11-08 MED ORDER — OXYCODONE-ACETAMINOPHEN 5-325 MG PO TABS
1.0000 | ORAL_TABLET | ORAL | Status: DC | PRN
  Administered 2023-11-09 – 2023-11-11 (×8): 1 via ORAL
  Filled 2023-11-08 (×9): qty 1

## 2023-11-08 MED ORDER — KETOROLAC TROMETHAMINE 15 MG/ML IJ SOLN
15.0000 mg | Freq: Once | INTRAMUSCULAR | Status: AC
  Administered 2023-11-08: 15 mg via INTRAVENOUS
  Filled 2023-11-08: qty 1

## 2023-11-08 MED ORDER — ARIPIPRAZOLE 15 MG PO TABS
30.0000 mg | ORAL_TABLET | Freq: Every day | ORAL | Status: DC
  Administered 2023-11-09 – 2023-11-11 (×3): 30 mg via ORAL
  Filled 2023-11-08 (×3): qty 2

## 2023-11-08 MED ORDER — MORPHINE SULFATE (PF) 4 MG/ML IV SOLN
4.0000 mg | Freq: Once | INTRAVENOUS | Status: AC
  Administered 2023-11-08: 4 mg via INTRAVENOUS
  Filled 2023-11-08: qty 1

## 2023-11-08 MED ORDER — LAMOTRIGINE 100 MG PO TABS
300.0000 mg | ORAL_TABLET | Freq: Every day | ORAL | Status: DC
  Administered 2023-11-09 – 2023-11-10 (×3): 300 mg via ORAL
  Filled 2023-11-08: qty 3
  Filled 2023-11-08: qty 12
  Filled 2023-11-08 (×2): qty 3

## 2023-11-08 MED ORDER — ONDANSETRON HCL 4 MG/2ML IJ SOLN
4.0000 mg | Freq: Three times a day (TID) | INTRAMUSCULAR | Status: DC | PRN
Start: 2023-11-08 — End: 2023-11-09
  Administered 2023-11-09: 4 mg via INTRAVENOUS
  Filled 2023-11-08: qty 2

## 2023-11-08 MED ORDER — ESTRADIOL 0.5 MG PO TABS
2.0000 mg | ORAL_TABLET | Freq: Every day | ORAL | Status: DC
  Filled 2023-11-08 (×3): qty 4

## 2023-11-08 MED ORDER — SODIUM CHLORIDE 0.9 % IV BOLUS
1000.0000 mL | Freq: Once | INTRAVENOUS | Status: AC
  Administered 2023-11-08: 1000 mL via INTRAVENOUS

## 2023-11-08 MED ORDER — OLANZAPINE 5 MG PO TBDP: 5.0000 mg | ORAL_TABLET | Freq: Two times a day (BID) | ORAL | Status: DC | PRN

## 2023-11-08 MED ORDER — QUETIAPINE FUMARATE 25 MG PO TABS
25.0000 mg | ORAL_TABLET | Freq: Every day | ORAL | Status: DC
  Administered 2023-11-09 – 2023-11-10 (×3): 25 mg via ORAL
  Filled 2023-11-08 (×3): qty 1

## 2023-11-08 MED ORDER — LITHIUM CARBONATE ER 300 MG PO TBCR
300.0000 mg | EXTENDED_RELEASE_TABLET | Freq: Every day | ORAL | Status: DC
  Administered 2023-11-09 – 2023-11-10 (×3): 300 mg via ORAL
  Filled 2023-11-08 (×3): qty 1

## 2023-11-08 MED ORDER — ACETAMINOPHEN 325 MG PO TABS: 650.0000 mg | ORAL_TABLET | Freq: Four times a day (QID) | ORAL | Status: DC | PRN

## 2023-11-08 MED ORDER — TRAZODONE HCL 50 MG PO TABS
150.0000 mg | ORAL_TABLET | Freq: Every day | ORAL | Status: DC
  Administered 2023-11-09 – 2023-11-10 (×3): 150 mg via ORAL
  Filled 2023-11-08 (×3): qty 1

## 2023-11-08 MED ORDER — MORPHINE SULFATE (PF) 2 MG/ML IV SOLN
2.0000 mg | INTRAVENOUS | Status: DC | PRN
  Administered 2023-11-09 (×3): 2 mg via INTRAVENOUS
  Filled 2023-11-08 (×3): qty 1

## 2023-11-08 MED ORDER — SODIUM CHLORIDE 0.9 % IV SOLN
1.0000 g | Freq: Three times a day (TID) | INTRAVENOUS | Status: DC
  Administered 2023-11-09: 1 g via INTRAVENOUS
  Filled 2023-11-08: qty 20

## 2023-11-08 MED ORDER — SIMETHICONE 80 MG PO CHEW: 125.0000 mg | CHEWABLE_TABLET | Freq: Four times a day (QID) | ORAL | Status: DC | PRN

## 2023-11-08 MED ORDER — ONDANSETRON HCL 4 MG/2ML IJ SOLN
4.0000 mg | Freq: Once | INTRAMUSCULAR | Status: AC
  Administered 2023-11-08: 4 mg via INTRAVENOUS
  Filled 2023-11-08: qty 2

## 2023-11-08 MED ORDER — CLONAZEPAM 1 MG PO TABS
2.0000 mg | ORAL_TABLET | Freq: Every day | ORAL | Status: DC
  Administered 2023-11-09 – 2023-11-10 (×3): 2 mg via ORAL
  Filled 2023-11-08: qty 2
  Filled 2023-11-08 (×2): qty 4

## 2023-11-08 MED ORDER — ENOXAPARIN SODIUM 60 MG/0.6ML IJ SOSY: 50.0000 mg | PREFILLED_SYRINGE | INTRAMUSCULAR | Status: DC

## 2023-11-08 MED ORDER — IOHEXOL 300 MG/ML  SOLN
100.0000 mL | Freq: Once | INTRAMUSCULAR | Status: AC | PRN
  Administered 2023-11-08: 100 mL via INTRAVENOUS

## 2023-11-08 MED ORDER — HYDROXYZINE HCL 25 MG PO TABS: 25.0000 mg | ORAL_TABLET | Freq: Four times a day (QID) | ORAL | Status: DC | PRN

## 2023-11-08 MED ORDER — SODIUM CHLORIDE 0.9 % IV BOLUS
500.0000 mL | Freq: Once | INTRAVENOUS | Status: AC
  Administered 2023-11-08: 500 mL via INTRAVENOUS

## 2023-11-08 MED ORDER — SODIUM CHLORIDE 0.9 % IV SOLN
1.0000 g | Freq: Once | INTRAVENOUS | Status: AC
  Administered 2023-11-08: 1 g via INTRAVENOUS
  Filled 2023-11-08: qty 20

## 2023-11-08 MED ORDER — POTASSIUM CHLORIDE CRYS ER 20 MEQ PO TBCR
40.0000 meq | EXTENDED_RELEASE_TABLET | Freq: Once | ORAL | Status: AC
  Administered 2023-11-08: 40 meq via ORAL
  Filled 2023-11-08: qty 2

## 2023-11-08 MED ORDER — BUSPIRONE HCL 10 MG PO TABS
10.0000 mg | ORAL_TABLET | Freq: Three times a day (TID) | ORAL | Status: DC
  Administered 2023-11-09 – 2023-11-11 (×8): 10 mg via ORAL
  Filled 2023-11-08 (×3): qty 2
  Filled 2023-11-08: qty 1
  Filled 2023-11-08 (×2): qty 2
  Filled 2023-11-08 (×2): qty 1

## 2023-11-08 NOTE — ED Triage Notes (Addendum)
 Hysterectomy on 3/17 was dx with bladder infection but has not gotten better. Pain with urination. Was seen on 4/2 for same. States has been continuing antibiotics but pain and nausea is getting worse. C/o dizziness.

## 2023-11-08 NOTE — Progress Notes (Signed)
 Pharmacy Antibiotic Note  Anna Flores is a 33 y.o. female admitted on 11/08/2023 with UTI.  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 Pharmacy has been consulted for Meropenem dosing.  Plan: Meropenem 1 gm q8hr per indication & renal fxn.  Pharmacy will continue to follow and will adjust abx dosing whenever warranted.  Temp (24hrs), Avg:98.7 F (37.1 C), Min:98.6 F (37 C), Max:98.8 F (37.1 C)   Recent Labs  Lab 11/05/23 2112 11/08/23 1945  WBC 10.8* 8.8  CREATININE 1.01* 1.25*  LATICACIDVEN 1.3  --     Estimated Creatinine Clearance: 76.9 mL/min (A) (by C-G formula based on SCr of 1.25 mg/dL (H)).    Allergies  Allergen Reactions   Amoxicillin Hives   Hydrocodone Shortness Of Breath and Other (See Comments)    "loss of consciousness"; "felt really weird" when she was given 10 mgm hydrocodone. Did well with 5 mgm tablet   Vancomycin Itching    C/O head burning and itching   Reglan [Metoclopramide] Anxiety and Other (See Comments)    Severe agitation/akathisia (reglan IV formulation)   Cherry Hives    Antimicrobials this admission: 4/05 Meropenem >>  Microbiology results: 4/05 UCx: Pending   Thank you for allowing pharmacy to be a part of this patient's care.  Otelia Sergeant, PharmD, MBA 11/08/2023 11:08 PM

## 2023-11-08 NOTE — ED Provider Notes (Signed)
 [facility] Provider Note    Event Date/Time   First MD Initiated Contact with Patient 11/08/23 2026     (approximate)   History   Abdominal Pain   HPI  Anna Flores is a 33 y.o. female past medical history significant for endometriosis, prior hysterectomy, resistant urinary tract infections, who presents to the emergency department with right-sided abdominal pain.  Patient states that she was recently evaluated in the emergency department and diagnosed with a urinary tract infection was started on Bactrim.  States that since that time she has had worsening pain to her right side.  Endorses urinary urgency, frequency with nausea and vomiting.  Denies any fever.  Denies any history of kidney stone.  States that she has been compliant with her antibiotics.  States that she had a hysterectomy secondary to severe endometriosis.     Physical Exam   Triage Vital Signs: ED Triage Vitals  Encounter Vitals Group     BP 11/08/23 1941 134/74     Systolic BP Percentile --      Diastolic BP Percentile --      Pulse Rate 11/08/23 1941 75     Resp 11/08/23 1941 19     Temp 11/08/23 1941 98.6 F (37 C)     Temp Source 11/08/23 1941 Oral     SpO2 11/08/23 1941 97 %     Weight 11/08/23 2109 227 lb 1.2 oz (103 kg)     Height 11/08/23 2109 5\' 4"  (1.626 m)     Head Circumference --      Peak Flow --      Pain Score 11/08/23 2113 7     Pain Loc --      Pain Education --      Exclude from Growth Chart --     Most recent vital signs: Vitals:   11/08/23 2117 11/08/23 2150  BP: 122/79 121/70  Pulse: 64 78  Resp: 16   Temp: 98.8 F (37.1 C)   SpO2: 99%     Physical Exam Constitutional:      Appearance: She is well-developed.  HENT:     Head: Atraumatic.  Eyes:     Conjunctiva/sclera: Conjunctivae normal.  Cardiovascular:     Rate and Rhythm: Regular rhythm.  Pulmonary:     Effort: No respiratory distress.  Abdominal:     General: There is no distension.      Tenderness: There is abdominal tenderness in the right lower quadrant. There is right CVA tenderness.  Musculoskeletal:        General: Normal range of motion.     Cervical back: Normal range of motion.  Skin:    General: Skin is warm.     Capillary Refill: Capillary refill takes less than 2 seconds.  Neurological:     Mental Status: She is alert. Mental status is at baseline.     IMPRESSION / MDM / ASSESSMENT AND PLAN / ED COURSE  I reviewed the triage vital signs and the nursing notes.  On chart review patient had a recent urine culture that was sensitive to Bactrim.  Differential diagnosis including complicated urinary tract infection, pyelonephritis, kidney stone, intra-abdominal abscess   EKG  I, Corena Herter, the attending physician, personally viewed and interpreted this ECG.   Rate: Normal  Rhythm: Normal sinus  Axis: Normal  Intervals: Normal  ST&T Change: None  No tachycardic or bradycardic dysrhythmias while on cardiac telemetry.  RADIOLOGY I independently reviewed imaging, my interpretation of  imaging: CT scan with no findings of hydronephrosis.  Read as no acute findings.  LABS (all labs ordered are listed, but only abnormal results are displayed) Labs interpreted as -    Labs Reviewed  COMPREHENSIVE METABOLIC PANEL WITH GFR - Abnormal; Notable for the following components:      Result Value   Sodium 134 (*)    Potassium 3.3 (*)    Glucose, Bld 110 (*)    Creatinine, Ser 1.25 (*)    GFR, Estimated 59 (*)    All other components within normal limits  URINALYSIS, ROUTINE W REFLEX MICROSCOPIC - Abnormal; Notable for the following components:   Color, Urine AMBER (*)    APPearance CLEAR (*)    Hgb urine dipstick MODERATE (*)    Nitrite POSITIVE (*)    Leukocytes,Ua MODERATE (*)    Bacteria, UA RARE (*)    All other components within normal limits  URINE CULTURE  CBC WITH DIFFERENTIAL/PLATELET  MAGNESIUM  PHOSPHORUS  HIV ANTIBODY (ROUTINE  TESTING W REFLEX)  BASIC METABOLIC PANEL WITH GFR  CBC  LITHIUM LEVEL     MDM    No leukocytosis.  Creatinine appears close to her baseline.  No significant electrolyte abnormality.  UA does not appear infected with positive nitrites and leukocyte esterase.  Repeat urine was sent for culture.  CT scan of the abdomen and pelvis without findings of an obstructing kidney stone.  Read as no acute findings.  Discussed the patient's case with the pharmacist on-call who reviewed her prior urinary cultures.  Stated that based on her culture data recommended meropenem.  Will start the patient on a dose of meropenem.  Consulted hospitalist for admission for complicated urinary tract infection with pyelonephritis.   PROCEDURES:  Critical Care performed: No  Procedures  Patient's presentation is most consistent with acute presentation with potential threat to life or bodily function.   MEDICATIONS ORDERED IN ED: Medications  oxyCODONE-acetaminophen (PERCOCET/ROXICET) 5-325 MG per tablet 1 tablet (has no administration in time range)  morphine (PF) 2 MG/ML injection 2 mg (has no administration in time range)  ondansetron (ZOFRAN) injection 4 mg (has no administration in time range)  acetaminophen (TYLENOL) tablet 650 mg (has no administration in time range)  meropenem (MERREM) 1 g in sodium chloride 0.9 % 100 mL IVPB (has no administration in time range)  ARIPiprazole (ABILIFY) tablet 30 mg (has no administration in time range)  busPIRone (BUSPAR) tablet 10 mg (has no administration in time range)  lithium carbonate (LITHOBID) ER tablet 300 mg (has no administration in time range)  OLANZapine zydis (ZYPREXA) disintegrating tablet 5 mg (has no administration in time range)  QUEtiapine (SEROQUEL) tablet 25 mg (has no administration in time range)  traZODone (DESYREL) tablet 150 mg (has no administration in time range)  estradiol (ESTRACE) tablet 2 mg (has no administration in time range)   simethicone (MYLICON) chewable tablet 120 mg (has no administration in time range)  clonazePAM (KLONOPIN) tablet 2 mg (has no administration in time range)  lamoTRIgine (LAMICTAL) tablet 300 mg (has no administration in time range)  hydrOXYzine (ATARAX) tablet 25 mg (has no administration in time range)  sodium chloride 0.9 % bolus 1,000 mL (0 mLs Intravenous Stopped 11/08/23 2222)  ondansetron (ZOFRAN) injection 4 mg (4 mg Intravenous Given 11/08/23 2111)  ketorolac (TORADOL) 15 MG/ML injection 15 mg (15 mg Intravenous Given 11/08/23 2113)  iohexol (OMNIPAQUE) 300 MG/ML solution 100 mL (100 mLs Intravenous Contrast Given 11/08/23 2120)  ondansetron (ZOFRAN) injection  4 mg (4 mg Intravenous Given 11/08/23 2223)  meropenem (MERREM) 1 g in sodium chloride 0.9 % 100 mL IVPB (0 g Intravenous Stopped 11/08/23 2323)  morphine (PF) 4 MG/ML injection 4 mg (4 mg Intravenous Given 11/08/23 2225)  potassium chloride SA (KLOR-CON M) CR tablet 40 mEq (40 mEq Oral Given 11/08/23 2311)  sodium chloride 0.9 % bolus 500 mL (500 mLs Intravenous New Bag/Given 11/08/23 2313)    FINAL CLINICAL IMPRESSION(S) / ED DIAGNOSES   Final diagnoses:  None     Rx / DC Orders   ED Discharge Orders     None        Note:  This document was prepared using Dragon voice recognition software and may include unintentional dictation errors.   Corena Herter, MD 11/09/23 [phone]

## 2023-11-08 NOTE — H&P (Signed)
 History and Physical    Anna Flores VWU:[id number] DOB: 1991-01-19 DOA: 11/08/2023  Referring MD/NP/PA:   PCP: Margarita Mail, DO   Patient coming from:  The patient is coming from home.     Chief Complaint: Dysuria, abdominal pain  HPI: Anna Flores is a 33 y.o. female with medical history significant of depression with anxiety, bipolar, obesity, endometriosis and menometrorrhagia, s/p of hysterectomy 10/20/23, who presents with dysuria and abdominal pain.  Patient states that she had hysterectomy on 10/20/2023.  She then developed UTI. Patient was initially treated with Macrobid, then switched to Bactrim on 3/31, without no improvement.  Patient was seen in ED on 4/2 and had CT scan of abdomen/pelvis which was negative for acute abnormalities.  Patient was continued on Bactrim on 4/7, still taking this medication currently.  Patient states that she does not feel better, and continues to have dysuria, burning on urination, urinary frequency and hematuria.  Patient also has nausea, vomiting and abdominal pain, which is located at the right side of abdomen, constant, aching, moderate, radiating to right flank area, not aggravated or alleviated by any known factors.  Patient has chills, but no fever.  He reports pink-colored vaginal discharge.   Of note, patient had positive urine culture for Enterobacter cloacae complex on 10/28/2023, which has multiple resistance.   Data reviewed independently and ED Course: pt was found to have WBC 8.8, positive UA (clear appearance, moderate amount leukocyte, positive nitrite, rare bacteria, WBC 11-20, squamous cells 6/10), potassium 3.3, AKI with creatinine 1.25, BUN 13 and GFR 59 (recent baseline creatinine 1.01 on 11/05/2023).  Temperature normal, blood pressure 121/70, heart rate 78, RR 19, oxygen saturation 99% on room air.  CT scan of abdomen/pelvis is negative for acute intracranial abnormalities.  Patient is placed in MedSurg bed for observation.  CT  abdomen/pelvis: 1. No acute intra-abdominal pathology identified. No definite radiographic explanation for the patient's reported symptoms. 2. Mild hepatomegaly and hepatic steatosis.    EKG:  Not done in ED, will get one.     Review of Systems:   General: no fevers, chills, no body weight gain, has poor appetite, has fatigue HEENT: no blurry vision, hearing changes or sore throat Respiratory: no dyspnea, coughing, wheezing CV: no chest pain, no palpitations GI: has nausea, vomiting, abdominal pain, no diarrhea, constipation GU: has dysuria, burning on urination, increased urinary frequency, hematuria. Has pink-colored vaginal discharge. Ext: no leg edema Neuro: no unilateral weakness, numbness, or tingling, no vision change or hearing loss Skin: no rash, no skin tear. MSK: No muscle spasm, no deformity, no limitation of range of movement in spin Heme: No easy bruising.  Travel history: No recent long distant travel.   Allergy:  Allergies  Allergen Reactions   Amoxicillin Hives   Hydrocodone Shortness Of Breath and Other (See Comments)    "loss of consciousness"; "felt really weird" when she was given 10 mgm hydrocodone. Did well with 5 mgm tablet   Vancomycin Itching    C/O head burning and itching   Reglan [Metoclopramide] Anxiety and Other (See Comments)    Severe agitation/akathisia (reglan IV formulation)   Cherry Hives    Past Medical History:  Diagnosis Date   Abdominal pain 10/26/2013   Anemia 2013   Anxiety    Back pain affecting pregnancy in third trimester 02/28/2020   Bipolar disorder (HCC)    Depression    History of Papanicolaou smear of cervix 10/02/2016   NEG   IDA (iron deficiency anemia) 02/29/2020  Indication for care in labor and delivery, antepartum 03/07/2020   Mastitis 01/19/2021   right   Menometrorrhagia 10/26/2013   PONV (postoperative nausea and vomiting)    Postpartum galactocele 02/01/2021   Pregnancy    DELIVERED 08/12/16 - [facility]     Past Surgical History:  Procedure Laterality Date   DIAGNOSTIC LAPAROSCOPY  2018   suspected ectopic pregnancy-[facility]   DILATION AND EVACUATION N/A 06/11/2021   Procedure: D & C;  Surgeon: Conard Novak, MD;  Location: [facility] OR[facility];  Service: Gynecology;  Laterality: N/A;   INCISION AND DRAINAGE ABSCESS Right 02/06/2021   Procedure: INCISION AND DRAINAGE ABSCESS;  Surgeon: Leafy Ro, MD;  Location: [facility];  Service: General;  Laterality: Right;   LAPAROSCOPIC VAGINAL HYSTERECTOMY WITH SALPINGO OOPHORECTOMY N/A 10/20/2023   Procedure: Laparoscopic Assisted Vaginal Hysterectomy with Bilateral Salpingo-oophorectomy;  Surgeon: Linzie Collin, MD;  Location: [facility];  Service: Gynecology;  Laterality: N/A;   LAPAROSCOPY N/A 06/11/2021   Procedure: LAPAROSCOPY DIAGNOSTIC;  Surgeon: Conard Novak, MD;  Location: [facility];  Service: Gynecology;  Laterality: N/A;   TONSILLECTOMY     XI ROBOTIC ASSISTED SALPINGECTOMY Bilateral 07/24/2021   Procedure: XI ROBOTIC ASSISTED SALPINGECTOMY;  Surgeon: [name], MD;  Location: [facility] OR[facility];  Service: Gynecology;  Laterality: Bilateral;    Social History:  reports that she has never smoked. She has never used smokeless tobacco. She reports current alcohol use. She reports that she does not use drugs.  Family History:  Family History  Adopted: Yes  Problem Relation Age of Onset   Nevi Sister        DISPLASTIC NEVUS   Ovarian cancer Mother    Cirrhosis Father      Prior to Admission medications   Medication Sig Start Date End Date Taking? Authorizing Provider  Accu-Chek Softclix Lancets lancets USE AS INSTRUCTED 12/06/22   Margarita Mail, DO  albuterol (VENTOLIN HFA) 108 (90 Base) MCG/ACT inhaler Inhale 2 puffs into the lungs every 6 (six) hours as needed for wheezing or shortness of breath. 06/13/23   Willy Eddy, MD  ARIPiprazole (ABILIFY) 30 MG tablet Take 30 mg by mouth in the morning. 04/28/23   [provider]  busPIRone (BUSPAR) 10 MG tablet Take 10 mg by mouth 3 (three) times daily. 03/18/22   [provider]  clonazePAM (KLONOPIN) 2 MG tablet Take 2 mg by mouth at bedtime. 04/11/23   [provider]  docusate sodium (COLACE) 50 MG capsule Take 50 mg by mouth 2 (two) times daily.    [provider]  estradiol (ESTRACE) 2 MG tablet Take 1 tablet (2 mg total) by mouth daily. 10/20/23 01/18/24  Linzie Collin, MD  HYDROcodone-acetaminophen (NORCO/VICODIN) 5-325 MG tablet Take 1-2 tablets by mouth every 6 (six) hours as needed for moderate pain (pain score 4-6). 10/20/23   Linzie Collin, MD  hydrOXYzine (ATARAX) 25 MG tablet Take 25 mg by mouth every 6 (six) hours as needed for anxiety. 09/23/23   [provider]  ibuprofen (ADVIL) 800 MG tablet Take 1 tablet (800 mg total) by mouth every 8 (eight) hours as needed. 10/20/23   Linzie Collin, MD  lamoTRIgine (LAMICTAL) 150 MG tablet Take 300 mg by mouth at bedtime. 09/04/23   [provider]  lithium carbonate (LITHOBID) 300 MG ER tablet Take 300 mg by mouth at bedtime. 09/23/23   [provider]  nitrofurantoin, macrocrystal-monohydrate, (MACROBID) 100 MG capsule Take 1 capsule (100 mg total) by  mouth 2 (two) times daily. 10/28/23   Linzie Collin, MD  OLANZapine zydis (ZYPREXA) 5 MG disintegrating tablet Take 5 mg by mouth 2 (two) times daily as needed (anxiety/agitation.). 04/28/23   [provider]  oxyCODONE-acetaminophen (PERCOCET) 5-325 MG tablet Take 1 tablet by mouth every 6 (six) hours as needed for up to 3 days for severe pain (pain score 7-10). 11/05/23 11/08/23  Trinna Post, MD  oxyCODONE-acetaminophen (PERCOCET/ROXICET) 5-325 MG tablet Take 1-2 tablets by mouth every 6 (six) hours as needed. 10/22/23   Linzie Collin, MD  QUEtiapine (SEROQUEL) 25 MG tablet Take 25 mg by mouth at bedtime. 09/23/23   [provider]  simethicone (MYLICON) 125 MG chewable tablet Chew 125 mg  by mouth every 6 (six) hours as needed for flatulence.    [provider]  sulfamethoxazole-trimethoprim (BACTRIM DS) 800-160 MG tablet Take 1 tablet by mouth 2 (two) times daily. 11/03/23   Linzie Collin, MD  sulfamethoxazole-trimethoprim (BACTRIM DS) 800-160 MG tablet Take 1 tablet by mouth 2 (two) times daily for 7 days. Start taking after completing the first week of your bactrim rx 11/10/23 11/17/23  Trinna Post, MD  traZODone (DESYREL) 150 MG tablet Take 150 mg by mouth at bedtime. 11/07/21   [provider]    Physical Exam: Vitals:   11/08/23 1941 11/08/23 2109 11/08/23 2117 11/08/23 2150  BP: 134/74  122/79 121/70  Pulse: 75  64 78  Resp: 19  16   Temp: 98.6 F (37 C)  98.8 F (37.1 C)   TempSrc: Oral  Oral   SpO2: 97%  99%   Weight:  103 kg    Height:  5\' 4"  (1.626 m)     General: Not in acute distress HEENT:       Eyes: PERRL, EOMI, no jaundice       ENT: No discharge from the ears and nose, no pharynx injection, no tonsillar enlargement.        Neck: No JVD, no bruit, no mass felt. Heme: No neck lymph node enlargement. Cardiac: S1/S2, RRR, No murmurs, No gallops or rubs. Respiratory: No rales, wheezing, rhonchi or rubs. GI: Soft, nondistended, has tenderness in right middle quadrant no rebound pain, BS present. GU: Has positive right CVA tenderness Ext: No pitting leg edema bilaterally. 1+DP/PT pulse bilaterally. Musculoskeletal: No joint deformities, No joint redness or warmth, no limitation of ROM in spin. Skin: No rashes.  Neuro: Alert, oriented X3, cranial nerves II-XII grossly intact, moves all extremities normally. Psych: Patient is not psychotic, no suicidal or hemocidal ideation.  Labs on Admission: I have personally reviewed following labs and imaging studies  CBC: Recent Labs  Lab 11/05/23 2112 11/08/23 1945  WBC 10.8* 8.8  NEUTROABS  --  4.8  HGB 13.1 12.6  HCT 38.0 36.8  MCV 86.8 87.6  PLT 325 330   Basic Metabolic  Panel: Recent Labs  Lab 11/05/23 2112 11/08/23 1945  NA 136 134*  K 3.6 3.3*  CL 103 101  CO2 23 22  GLUCOSE 99 110*  BUN 8 13  CREATININE 1.01* 1.25*  CALCIUM 9.5 9.5  MG  --  2.3  PHOS  --  2.7   GFR: Estimated Creatinine Clearance: 76.9 mL/min (A) (by C-G formula based on SCr of 1.25 mg/dL (H)). Liver Function Tests: Recent Labs  Lab 11/05/23 2112 11/08/23 1945  AST 17 19  ALT 16 18  ALKPHOS 74 68  BILITOT 1.1 0.9  PROT 8.1 7.7  ALBUMIN  4.7 4.6   Recent Labs  Lab 11/05/23 2112  LIPASE 46   No results for input(s): "AMMONIA" in the last 168 hours. Coagulation Profile: No results for input(s): "INR", "PROTIME" in the last 168 hours. Cardiac Enzymes: No results for input(s): "CKTOTAL", "CKMB", "CKMBINDEX", "TROPONINI" in the last 168 hours. BNP (last 3 results) No results for input(s): "PROBNP" in the last 8760 hours. HbA1C: No results for input(s): "HGBA1C" in the last 72 hours. CBG: No results for input(s): "GLUCAP" in the last 168 hours. Lipid Profile: No results for input(s): "CHOL", "HDL", "LDLCALC", "TRIG", "CHOLHDL", "LDLDIRECT" in the last 72 hours. Thyroid Function Tests: No results for input(s): "TSH", "T4TOTAL", "FREET4", "T3FREE", "THYROIDAB" in the last 72 hours. Anemia Panel: No results for input(s): "VITAMINB12", "FOLATE", "FERRITIN", "TIBC", "IRON", "RETICCTPCT" in the last 72 hours. Urine analysis:    Component Value Date/Time   COLORURINE AMBER (A) 11/08/2023 2110   APPEARANCEUR CLEAR (A) 11/08/2023 2110   APPEARANCEUR Clear 10/27/2014 0200   LABSPEC 1.005 11/08/2023 2110   LABSPEC 1.012 10/27/2014 0200   PHURINE 5.0 11/08/2023 2110   GLUCOSEU NEGATIVE 11/08/2023 2110   GLUCOSEU Negative 10/27/2014 0200   HGBUR MODERATE (A) 11/08/2023 2110   BILIRUBINUR NEGATIVE 11/08/2023 2110   BILIRUBINUR neg 10/28/2023 0953   BILIRUBINUR Negative 10/27/2014 0200   KETONESUR NEGATIVE 11/08/2023 2110   PROTEINUR NEGATIVE 11/08/2023 2110    UROBILINOGEN 0.2 10/28/2023 0953   NITRITE POSITIVE (A) 11/08/2023 2110   LEUKOCYTESUR MODERATE (A) 11/08/2023 2110   LEUKOCYTESUR Negative 10/27/2014 0200   Sepsis Labs: @LABRCNTIP (procalcitonin:4,lacticidven:4) )No results found for this or any previous visit (from the past 240 hours).   Radiological Exams on Admission:   Assessment/Plan Principal Problem:   Acute pyelonephritis Active Problems:   AKI (acute kidney injury) (HCC)   Hypokalemia   Bipolar 1 disorder (HCC)   Depression with anxiety   Obesity (BMI 30-39.9)   Assessment and Plan:  Acute pyelonephritis: Patient has typical symptoms of urine infection, positive right CVA tenderness and positive UA, clinically consistent with pyelonephritis.  Patient does not have fever or leukocytosis.  No sepsis.  Patient failed outpatient oral antibiotic treatment.  Patient had positive urine culture for Enterobacter cloacae complex which has multiple resistance.  -Place in med-surg bed for observation - IV meropenem - Follow-up urine culture - As needed Zofran for nausea vomiting - As needed Percocet, morphine, Tylenol for pain  AKI (acute kidney injury) [facility]): Patient has some mild AKI, likely due to combination of pyelonephritis and Bactrim use. -Discontinue Bactrim - IV fluid: 1.5 L normal saline - Avoid using renal toxic medications  Hypokalemia: Potassium 3.3 - Repleted potassium - Check magnesium level - Check phosphorus level  Bipolar 1 disorder (HCC), depression with anxiety: Patient is calm now - Current home medications: Klonopin, Abilify, BuSpar, lithium, Seroquel, Lamictal, trazodone -as needed olanzapine for agitation - As needed hydroxyzine for anxiety -check lithium level  Obesity (BMI 30-39.9): Body weight well 3 kg, BMI 38.98 - Encourage losing weight - Exercise healthy diet       DVT ppx: SCD  Code Status: Full code  Family Communication:     not done, no family member is at bed side.     Disposition Plan:  Anticipate discharge back to previous environment  Consults called:  none  Admission status and Level of care: Med-Surg:    for obs    Dispo: The patient is from: Home  Anticipated d/c is to: Home              Anticipated d/c date is: 1 day              Patient currently is not medically stable to d/c.    Severity of Illness:  The appropriate patient status for this patient is OBSERVATION. Observation status is judged to be reasonable and necessary in order to provide the required intensity of service to ensure the patient's safety. The patient's presenting symptoms, physical exam findings, and initial radiographic and laboratory data in the context of their medical condition is felt to place them at decreased risk for further clinical deterioration. Furthermore, it is anticipated that the patient will be medically stable for discharge from the hospital within 2 midnights of admission.        Date of Service 11/08/2023    Lorretta Harp [facility]   If 7PM-7AM, please contact night-coverage [email or web address] 11/08/2023, 11:34 PM

## 2023-11-08 NOTE — Group Note (Deleted)
 Date:  11/08/2023 Time:  9:53 PM  Group Topic/Focus:  Wrap-Up Group:   The focus of this group is to help patients review their daily goal of treatment and discuss progress on daily workbooks.     Participation Level:  {BHH PARTICIPATION WUJWJ:19147}  Participation Quality:  {BHH PARTICIPATION QUALITY:22265}  Affect:  {BHH AFFECT:22266}  Cognitive:  {BHH COGNITIVE:22267}  Insight: {BHH Insight2:20797}  Engagement in Group:  {BHH ENGAGEMENT IN WGNFA:21308}  Modes of Intervention:  {BHH MODES OF INTERVENTION:22269}  Additional Comments:  ***  Maglione,Angell Honse E 11/08/2023, 9:53 PM

## 2023-11-08 NOTE — ED Notes (Signed)
 Patient transported to CT

## 2023-11-08 NOTE — Progress Notes (Signed)
 PHARMACIST - PHYSICIAN COMMUNICATION  CONCERNING:  Enoxaparin (Lovenox) for DVT Prophylaxis    RECOMMENDATION: Patient was prescribed enoxaprin 40mg  q24 hours for VTE prophylaxis.   Filed Weights   11/08/23 2109  Weight: 103 kg (227 lb 1.2 oz)    Body mass index is 38.98 kg/m.  Estimated Creatinine Clearance: 76.9 mL/min (A) (by C-G formula based on SCr of 1.25 mg/dL (H)).   Based on [facility] policy patient is candidate for enoxaparin 0.5mg /kg TBW SQ every 24 hours based on BMI being >30.  DESCRIPTION: Pharmacy has adjusted enoxaparin dose per [facility] policy.  Patient is now receiving enoxaparin 0.5 mg/kg every 24 hours   Otelia Sergeant, PharmD, [facility] 11/08/2023 10:57 PM

## 2023-11-09 DIAGNOSIS — Z888 Allergy status to other drugs, medicaments and biological substances status: Secondary | ICD-10-CM | POA: Diagnosis not present

## 2023-11-09 DIAGNOSIS — F419 Anxiety disorder, unspecified: Secondary | ICD-10-CM | POA: Diagnosis not present

## 2023-11-09 DIAGNOSIS — R1030 Lower abdominal pain, unspecified: Secondary | ICD-10-CM | POA: Diagnosis not present

## 2023-11-09 DIAGNOSIS — E860 Dehydration: Secondary | ICD-10-CM | POA: Diagnosis not present

## 2023-11-09 DIAGNOSIS — N12 Tubulo-interstitial nephritis, not specified as acute or chronic: Secondary | ICD-10-CM | POA: Diagnosis not present

## 2023-11-09 DIAGNOSIS — N10 Acute tubulo-interstitial nephritis: Secondary | ICD-10-CM | POA: Diagnosis not present

## 2023-11-09 DIAGNOSIS — Z6838 Body mass index (BMI) 38.0-38.9, adult: Secondary | ICD-10-CM | POA: Diagnosis not present

## 2023-11-09 DIAGNOSIS — S22089A Unspecified fracture of T11-T12 vertebra, initial encounter for closed fracture: Secondary | ICD-10-CM | POA: Diagnosis not present

## 2023-11-09 DIAGNOSIS — Z880 Allergy status to penicillin: Secondary | ICD-10-CM | POA: Diagnosis not present

## 2023-11-09 DIAGNOSIS — E66812 Obesity, class 2: Secondary | ICD-10-CM | POA: Diagnosis not present

## 2023-11-09 DIAGNOSIS — F319 Bipolar disorder, unspecified: Secondary | ICD-10-CM | POA: Diagnosis not present

## 2023-11-09 DIAGNOSIS — Z885 Allergy status to narcotic agent status: Secondary | ICD-10-CM | POA: Diagnosis not present

## 2023-11-09 DIAGNOSIS — E669 Obesity, unspecified: Secondary | ICD-10-CM | POA: Diagnosis not present

## 2023-11-09 DIAGNOSIS — X58XXXA Exposure to other specified factors, initial encounter: Secondary | ICD-10-CM | POA: Diagnosis present

## 2023-11-09 DIAGNOSIS — R1031 Right lower quadrant pain: Secondary | ICD-10-CM | POA: Diagnosis not present

## 2023-11-09 DIAGNOSIS — Z90710 Acquired absence of both cervix and uterus: Secondary | ICD-10-CM | POA: Diagnosis not present

## 2023-11-09 DIAGNOSIS — Y929 Unspecified place or not applicable: Secondary | ICD-10-CM | POA: Diagnosis not present

## 2023-11-09 DIAGNOSIS — R160 Hepatomegaly, not elsewhere classified: Secondary | ICD-10-CM | POA: Diagnosis not present

## 2023-11-09 DIAGNOSIS — N179 Acute kidney failure, unspecified: Secondary | ICD-10-CM | POA: Diagnosis not present

## 2023-11-09 DIAGNOSIS — E559 Vitamin D deficiency, unspecified: Secondary | ICD-10-CM | POA: Diagnosis not present

## 2023-11-09 DIAGNOSIS — Z79899 Other long term (current) drug therapy: Secondary | ICD-10-CM | POA: Diagnosis not present

## 2023-11-09 DIAGNOSIS — K760 Fatty (change of) liver, not elsewhere classified: Secondary | ICD-10-CM | POA: Diagnosis not present

## 2023-11-09 DIAGNOSIS — E876 Hypokalemia: Secondary | ICD-10-CM | POA: Diagnosis not present

## 2023-11-09 DIAGNOSIS — I10 Essential (primary) hypertension: Secondary | ICD-10-CM | POA: Diagnosis not present

## 2023-11-09 DIAGNOSIS — F418 Other specified anxiety disorders: Secondary | ICD-10-CM | POA: Diagnosis not present

## 2023-11-09 LAB — BASIC METABOLIC PANEL WITH GFR
Anion gap: 7 (ref 5–15)
BUN: 11 mg/dL (ref 6–20)
CO2: 24 mmol/L (ref 22–32)
Calcium: 8.8 mg/dL — ABNORMAL LOW (ref 8.9–10.3)
Chloride: 105 mmol/L (ref 98–111)
Creatinine, Ser: 1.04 mg/dL — ABNORMAL HIGH (ref 0.44–1.00)
GFR, Estimated: >60 mL/min (ref >60)
Glucose, Bld: 92 mg/dL (ref 70–99)
Potassium: 3.8 mmol/L (ref 3.5–5.1)
Sodium: 136 mmol/L (ref 135–145)

## 2023-11-09 LAB — CBC
HCT: 33.6 % — ABNORMAL LOW (ref 36.0–46.0)
Hemoglobin: 11.5 g/dL — ABNORMAL LOW (ref 12.0–15.0)
MCH: 29.2 pg (ref 26.0–34.0)
MCHC: 34.2 g/dL (ref 30.0–36.0)
MCV: 85.3 fL (ref 80.0–100.0)
Platelets: 285 10*3/uL (ref 150–400)
RBC: 3.94 MIL/uL (ref 3.87–5.11)
RDW: 12.5 % (ref 11.5–15.5)
WBC: 6 10*3/uL (ref 4.0–10.5)
nRBC: 0 % (ref 0.0–0.2)

## 2023-11-09 LAB — LITHIUM LEVEL: Lithium Lvl: 0.37 mmol/L — ABNORMAL LOW (ref 0.60–1.20)

## 2023-11-09 LAB — CBG MONITORING, ED: Glucose-Capillary: 120 mg/dL — ABNORMAL HIGH (ref 70–99)

## 2023-11-09 LAB — HIV ANTIBODY (ROUTINE TESTING W REFLEX): HIV Screen 4th Generation wRfx: NONREACTIVE

## 2023-11-09 MED ORDER — ONDANSETRON HCL 4 MG/2ML IJ SOLN
4.0000 mg | Freq: Four times a day (QID) | INTRAMUSCULAR | Status: DC | PRN
  Administered 2023-11-09 – 2023-11-10 (×3): 4 mg via INTRAVENOUS
  Filled 2023-11-09 (×3): qty 2

## 2023-11-09 MED ORDER — ENOXAPARIN SODIUM 60 MG/0.6ML IJ SOSY
50.0000 mg | PREFILLED_SYRINGE | INTRAMUSCULAR | Status: DC
  Filled 2023-11-09 (×2): qty 0.6

## 2023-11-09 MED ORDER — SODIUM CHLORIDE 0.9 % IV SOLN
2.0000 g | Freq: Three times a day (TID) | INTRAVENOUS | Status: DC
  Administered 2023-11-09 – 2023-11-11 (×6): 2 g via INTRAVENOUS
  Filled 2023-11-09 (×8): qty 12.5

## 2023-11-09 MED ORDER — HYDROMORPHONE HCL 1 MG/ML IJ SOLN
0.5000 mg | INTRAMUSCULAR | Status: DC | PRN
  Administered 2023-11-09 – 2023-11-10 (×6): 0.5 mg via INTRAVENOUS
  Filled 2023-11-09 (×6): qty 0.5

## 2023-11-09 MED ORDER — ENOXAPARIN SODIUM 60 MG/0.6ML IJ SOSY: 0.5000 mg/kg | PREFILLED_SYRINGE | INTRAMUSCULAR | Status: DC

## 2023-11-09 MED ORDER — PROMETHAZINE HCL 25 MG/ML IJ SOLN
6.2500 mg | Freq: Four times a day (QID) | INTRAMUSCULAR | Status: DC | PRN
  Filled 2023-11-09: qty 1

## 2023-11-09 MED ORDER — PROMETHAZINE HCL 25 MG PO TABS
12.5000 mg | ORAL_TABLET | Freq: Four times a day (QID) | ORAL | Status: DC | PRN
Start: 2023-11-09 — End: 2023-11-11
  Administered 2023-11-09: 12.5 mg via ORAL
  Filled 2023-11-09 (×3): qty 1

## 2023-11-09 NOTE — Progress Notes (Addendum)
 Patient reports still feeling hot and shaky. No temp. Blood sugar checked and normal. Temperature in room adjusted. Ice pack refilled. Patient given ginger ale and saltines to try and encourage fluid intake.

## 2023-11-09 NOTE — Progress Notes (Signed)
 Notified provider about nausea. Patient not due for zofran. IM phenergan ordered.

## 2023-11-09 NOTE — Progress Notes (Signed)
 Patient up to go to the bathroom. She became a little dizzy when first standing up. Patient sat back down. Dizziness improved. Assisted patient to bathroom due to some unsteadiness from residual dizziness. Patient not eating or drinking much due to nausea.

## 2023-11-09 NOTE — ED Notes (Signed)
 Advised nurse that patient has ready bed

## 2023-11-09 NOTE — Progress Notes (Signed)
 PROGRESS NOTE    Anna Flores  ZOX:[id number] DOB: 1990-10-03 DOA: 11/08/2023 PCP: Margarita Mail, DO  Chief Complaint  Patient presents with   Abdominal Pain    Hospital Course:  Anna Flores is 33 y.o. female with pression, anxiety, bipolar, obesity, endometriosis, status post hysterectomy on 3/17 who presents with dysuria and abdominal pain.  Patient reports after her hysterectomy she developed UTI and was initially treated with Macrobid though due to resistance she was switched to Bactrim on 3/31.  She reports no significant symptom improvement since that time.  She was seen on 4/2 in the ED and had CT abdomen pelvis which was negative for acute abnormalities.  She was continued on Bactrim and was still taking at the time of this admission.  Patient reports continued dysuria, urinary frequency, hematuria.  She also endorses nausea, vomiting, right flank pain. Chart review reveals patient had positive urine culture on 3/25 with Enterobacter cloacae complex with resistance. In the ED patient had no leukocytosis, UA with leukocytes, nitrites, WBCs, AKI with creatinine of 1.2, vital signs stable.  CT abdomen pelvis negative for acute abnormalities.  She was started on meropenem and admitted.  Subjective: Patient endorses ongoing flank pain.  Minimal pain relief morphine.  She is requesting Dilaudid.  She is also requesting Phenergan. Bedside RN reports and patient was ambulating earlier she had a near syncopal episode.  Objective: Vitals:   11/09/23 0000 11/09/23 0331 11/09/23 0400 11/09/23 0829  BP: 107/69 109/65 118/79 119/66  Pulse: 65 63 63 85  Resp: 18 15 14 17   Temp: 98 F (36.7 C) 98.1 F (36.7 C)  98 F (36.7 C)  TempSrc: Oral Oral  Oral  SpO2: 97% 100% 100% 100%  Weight:      Height:        Intake/Output Summary (Last 24 hours) at 11/09/2023 0954 Last data filed at 11/09/2023 0500 Gross per 24 hour  Intake 1580 ml  Output --  Net 1580 ml   Filed Weights   11/08/23 2109   Weight: 103 kg    Examination: General exam: Appears calm and comfortable, NAD  Respiratory system: No work of breathing, symmetric chest wall expansion Cardiovascular system: S1 & S2 heard, RRR.  Gastrointestinal system: Abdomen is nondistended, soft and nontender.  Neuro: Alert and oriented. No focal neurological deficits. Extremities: Symmetric, expected ROM Skin: No rashes, lesions Psychiatry: Demonstrates appropriate judgement and insight. Mood & affect appropriate for situation.   Assessment & Plan:  Principal Problem:   Acute pyelonephritis Active Problems:   AKI (acute kidney injury) (HCC)   Hypokalemia   Bipolar 1 disorder (HCC)   Depression with anxiety   Obesity (BMI 30-39.9)    Acute pyelonephritis - Patient has been on antibiotics since 3/14.  Concern for growing resistance.  Initially on Macrobid, switch to Bactrim 3/31 which she has been taking consistently. - Has now failed outpatient oral antibiotic treatment. - Follow new urine culture - For now we will continue with cefepime.  Have discussed with pharmacology.  Increasing resistance with Enterobacter.  De-escalate antibiotics when able - Continue with as needed antiemetics and pain control  AKI - Likely secondary to pyelonephritis, potentially worsened by Bactrim - Continue with IV fluids - Avoid nephrotoxic meds - Trend CMP, creatinine resolving now  Near syncopal episode - CMP this morning unconcerning, BUN 11, not consistent with dehydration - Have encouraged p.o. intake - Suspect syncope secondary to pain meds and orthostasis - Will order orthostatic vitals.  Hypokalemia - Replace as  needed  Bipolar 1 disorder Depression with anxiety - Resume home meds  BMI 38 Obesity - Outpatient follow up for lifestyle modification and risk factor management  Hepatic medically antibiotic steatosis - Incidentally seen on CT. - Needs outpatient follow-up with PCP for surveillance lifestyle  modifications  T12 fracture - Incidentally seen on CT.  Remote T12 superior endplate fracture with minimal height loss - PT/OT   DVT prophylaxis: Lovenox   Code Status: Full Code Family Communication:  Discussed directly with patient Disposition: Will make inpatient, still hospitalized for IV antibiotics, pain control.  Pending urine culture.  Will discharge when able to tolerate p.o. and de-escalate to p.o. antibiotics. Consultants:    Procedures:    Antimicrobials:  Anti-infectives (From admission, onward)    Start     Dose/Rate Route Frequency Ordered Stop   11/09/23 0600  meropenem (MERREM) 1 g in sodium chloride 0.9 % 100 mL IVPB        1 g 200 mL/hr over 30 Minutes Intravenous Every 8 hours 11/08/23 2307     11/08/23 2230  meropenem (MERREM) 1 g in sodium chloride 0.9 % 100 mL IVPB        1 g 200 mL/hr over 30 Minutes Intravenous  Once 11/08/23 2215 11/08/23 2323       Data Reviewed: I have personally reviewed following labs and imaging studies CBC: Recent Labs  Lab 11/05/23 2112 11/08/23 1945 11/09/23 0613  WBC 10.8* 8.8 6.0  NEUTROABS  --  4.8  --   HGB 13.1 12.6 11.5*  HCT 38.0 36.8 33.6*  MCV 86.8 87.6 85.3  PLT 325 330 285   Basic Metabolic Panel: Recent Labs  Lab 11/05/23 2112 11/08/23 1945 11/09/23 0613  NA 136 134* 136  K 3.6 3.3* 3.8  CL 103 101 105  CO2 23 22 24   GLUCOSE 99 110* 92  BUN 8 13 11   CREATININE 1.01* 1.25* 1.04*  CALCIUM 9.5 9.5 8.8*  MG  --  2.3  --   PHOS  --  2.7  --    GFR: Estimated Creatinine Clearance: 92.4 mL/min (A) (by C-G formula based on SCr of 1.04 mg/dL (H)). Liver Function Tests: Recent Labs  Lab 11/05/23 2112 11/08/23 1945  AST 17 19  ALT 16 18  ALKPHOS 74 68  BILITOT 1.1 0.9  PROT 8.1 7.7  ALBUMIN 4.7 4.6   CBG: No results for input(s): "GLUCAP" in the last 168 hours.  No results found for this or any previous visit (from the past 240 hours).   Radiology Studies: CT ABDOMEN PELVIS W  CONTRAST Result Date: 11/08/2023 CLINICAL DATA:  Right lower quadrant abdominal pain. Status post hysterectomy 10/20/2023. Urinary tract infection, dysuria. EXAM: CT ABDOMEN AND PELVIS WITH CONTRAST TECHNIQUE: Multidetector CT imaging of the abdomen and pelvis was performed using the standard protocol following bolus administration of intravenous contrast. RADIATION DOSE REDUCTION: This exam was performed according to the departmental dose-optimization program which includes automated exposure control, adjustment of the mA and/or kV according to patient size and/or use of iterative reconstruction technique. CONTRAST:  OMNIPAQUE IOHEXOL 300 MG/ML  SOLN COMPARISON:  None Available. FINDINGS: Lower chest: No acute abnormality. Hepatobiliary: Mild hepatomegaly and hepatic steatosis. No enhancing intrahepatic mass. No intra or extrahepatic biliary ductal dilation. Gallbladder unremarkable. Pancreas: Unremarkable Spleen: Unremarkable Adrenals/Urinary Tract: Adrenal glands are unremarkable. Kidneys are normal, without renal calculi, focal lesion, or hydronephrosis. Bladder is unremarkable. Stomach/Bowel: Stomach is within normal limits. Appendix appears normal. No evidence of bowel  wall thickening, distention, or inflammatory changes. Vascular/Lymphatic: No significant vascular findings are present. No enlarged abdominal or pelvic lymph nodes. Reproductive: Status post hysterectomy. No adnexal masses. Other: No abdominal wall hernia or abnormality. No abdominopelvic ascites. Musculoskeletal: No acute or significant osseous findings. Remote T12 superior endplate fracture with minimal loss of height anteriorly. IMPRESSION: 1. No acute intra-abdominal pathology identified. No definite radiographic explanation for the patient's reported symptoms. 2. Mild hepatomegaly and hepatic steatosis. Electronically Signed   By: Helyn Numbers M.D.   On: 11/08/2023 21:40    Scheduled Meds:  ARIPiprazole  30 mg Oral Q breakfast    busPIRone  10 mg Oral TID   clonazePAM  2 mg Oral QHS   estradiol  2 mg Oral Daily   lamoTRIgine  300 mg Oral QHS   lithium carbonate  300 mg Oral QHS   QUEtiapine  25 mg Oral QHS   traZODone  150 mg Oral QHS   Continuous Infusions:  meropenem (MERREM) IV Stopped (11/09/23 0717)     LOS: 0 days  MDM: Patient is high risk for one or more organ failure.  They necessitate ongoing hospitalization for continued IV therapies and subsequent lab monitoring. Total time spent interpreting labs and vitals, coordinating care amongst consultants and care team members, directly assessing and discussing care with the patient and/or family: 55 min    Debarah Crape, DO [facility]  To contact the attending physician between 7A-7P please use Epic Chat. To contact the covering physician during after hours 7P-7A, please review Amion.   11/09/2023, 9:54 AM   *This document has been created with the assistance of dictation software. Please excuse typographical errors. *

## 2023-11-09 NOTE — Progress Notes (Signed)
 Patient afraid of shots. Provider messaged for zofran frequency change so patient could get anti-emetic now. Patient unable to eat breakfast.

## 2023-11-09 NOTE — Progress Notes (Addendum)
 Patient ambulating back from bathroom and became very dizzy and pale. Patient assisted to sit on bedside commode that was in hall right outside bathroom. Patient sat for a few minutes then color returned and she was assisted back to bed in hall where she continued to feel dizzy. It was felt that patient should not ambulate  that distance again so she was moved into a open room in area to allow usage of bedside commode. VSS. Patient given cool cloth for head and ice pack for back of neck with some relief. Provider notified and asked for fluids due to poor PO intake. Patient has had about 60 ml orange juice and sips of water. Per provider patient labs did not show dehydration continue to monitor.

## 2023-11-10 ENCOUNTER — Inpatient Hospital Stay

## 2023-11-10 ENCOUNTER — Encounter: Payer: Self-pay | Admitting: Family Medicine

## 2023-11-10 DIAGNOSIS — N179 Acute kidney failure, unspecified: Secondary | ICD-10-CM | POA: Diagnosis not present

## 2023-11-10 DIAGNOSIS — N10 Acute tubulo-interstitial nephritis: Secondary | ICD-10-CM | POA: Diagnosis not present

## 2023-11-10 LAB — URINE CULTURE: Culture: 30000 — AB

## 2023-11-10 LAB — BASIC METABOLIC PANEL WITH GFR
Anion gap: 10 (ref 5–15)
BUN: 9 mg/dL (ref 6–20)
CO2: 26 mmol/L (ref 22–32)
Calcium: 9.7 mg/dL (ref 8.9–10.3)
Chloride: 100 mmol/L (ref 98–111)
Creatinine, Ser: 1.04 mg/dL — ABNORMAL HIGH (ref 0.44–1.00)
GFR, Estimated: >60 mL/min (ref >60)
Glucose, Bld: 95 mg/dL (ref 70–99)
Potassium: 4.3 mmol/L (ref 3.5–5.1)
Sodium: 136 mmol/L (ref 135–145)

## 2023-11-10 LAB — CBC
HCT: 38.3 % (ref 36.0–46.0)
Hemoglobin: 12.6 g/dL (ref 12.0–15.0)
MCH: 29.2 pg (ref 26.0–34.0)
MCHC: 32.9 g/dL (ref 30.0–36.0)
MCV: 88.9 fL (ref 80.0–100.0)
Platelets: 292 10*3/uL (ref 150–400)
RBC: 4.31 MIL/uL (ref 3.87–5.11)
RDW: 12.2 % (ref 11.5–15.5)
WBC: 5.2 10*3/uL (ref 4.0–10.5)
nRBC: 0 % (ref 0.0–0.2)

## 2023-11-10 LAB — IRON AND TIBC
Iron: 66 ug/dL (ref 28–170)
Saturation Ratios: 18 % (ref 10.4–31.8)
TIBC: 364 ug/dL (ref 250–450)
UIBC: 298 ug/dL

## 2023-11-10 LAB — VITAMIN D 25 HYDROXY (VIT D DEFICIENCY, FRACTURES): Vit D, 25-Hydroxy: 27.42 ng/mL — ABNORMAL LOW (ref 30–100)

## 2023-11-10 LAB — VITAMIN B12: Vitamin B-12: 366 pg/mL (ref 180–914)

## 2023-11-10 LAB — PHOSPHORUS: Phosphorus: 2.7 mg/dL (ref 2.5–4.6)

## 2023-11-10 LAB — MAGNESIUM: Magnesium: 2.2 mg/dL (ref 1.7–2.4)

## 2023-11-10 LAB — FOLATE: Folate: 11.3 ng/mL (ref >5.9)

## 2023-11-10 MED ORDER — SODIUM CHLORIDE 0.9 % IV SOLN
12.5000 mg | Freq: Four times a day (QID) | INTRAVENOUS | Status: DC | PRN
  Administered 2023-11-10: 12.5 mg via INTRAVENOUS
  Filled 2023-11-10: qty 12.5

## 2023-11-10 MED ORDER — ACETAMINOPHEN 325 MG PO TABS
650.0000 mg | ORAL_TABLET | Freq: Four times a day (QID) | ORAL | Status: DC | PRN
  Administered 2023-11-10 – 2023-11-11 (×2): 650 mg via ORAL
  Filled 2023-11-10 (×2): qty 2

## 2023-11-10 MED ORDER — METHOCARBAMOL 1000 MG/10ML IJ SOLN
500.0000 mg | Freq: Once | INTRAMUSCULAR | Status: AC
  Administered 2023-11-10: 500 mg via INTRAVENOUS
  Filled 2023-11-10: qty 10

## 2023-11-10 NOTE — Progress Notes (Signed)
   11/10/23 2034  Vitals  Temp 98.1 F (36.7 C)  BP (!) 137/96  MAP (mmHg) 107  BP Location Right Arm  BP Method Automatic  Patient Position (if appropriate) Sitting  Pulse Rate 82  Pulse Rate Source Monitor  Resp 18  MEWS COLOR  MEWS Score Color Green  Oxygen Therapy  SpO2 100 %  O2 Device Room Air  MEWS Score  MEWS Temp 0  MEWS Systolic 0  MEWS Pulse 0  MEWS RR 0  MEWS LOC 0  MEWS Score 0   Patient having persistent nausea with vomiting; attempted to give PO Phenergan but pt unable to tolerate at this time.  Provider notified via secure chat with request for route change; ordered by provider.  Plan of care continues.

## 2023-11-10 NOTE — Plan of Care (Signed)

## 2023-11-10 NOTE — Progress Notes (Signed)
 [facility] Progress Note  Patient: Anna Flores    ZOX:[id number]  DOA: 11/08/2023     Date of Service: the patient was seen and examined on 11/10/2023  Chief Complaint  Patient presents with   Abdominal Pain   Brief hospital course: Anna Flores is 33 y.o. female with pression, anxiety, bipolar, obesity, endometriosis, status post hysterectomy on 3/17 who presents with dysuria and abdominal pain.  Patient reports after her hysterectomy she developed UTI and was initially treated with Macrobid though due to resistance she was switched to Bactrim on 3/31.  She reports no significant symptom improvement since that time.  She was seen on 4/2 in the ED and had CT abdomen pelvis which was negative for acute abnormalities.  She was continued on Bactrim and was still taking at the time of this admission.  Patient reports continued dysuria, urinary frequency, hematuria.  She also endorses nausea, vomiting, right flank pain. Chart review reveals patient had positive urine culture on 3/25 with Enterobacter cloacae complex with resistance. In the ED patient had no leukocytosis, UA with leukocytes, nitrites, WBCs, AKI with creatinine of 1.2, vital signs stable.  CT abdomen pelvis negative for acute abnormalities.  She was started on meropenem and admitted.   Assessment and Plan:    Acute pyelonephritis - Patient has been on antibiotics since 3/14.  Concern for growing resistance.  Initially on Macrobid, switch to Bactrim 3/31 which she has been taking consistently. - Has now failed outpatient oral antibiotic treatment. - Follow new urine culture - For now we will continue with cefepime.  Have discussed with pharmacology.  Increasing resistance with Enterobacter.  De-escalate antibiotics when able - Continue with as needed antiemetics and pain control Urine culture growing 30K staph epi 4/7 right flank pain most likely musculoskeletal, Robaxin one-time dose given   AKI - Likely secondary to  pyelonephritis, potentially worsened by Bactrim - Continue with IV fluids - Avoid nephrotoxic meds - Trend CMP, creatinine resolving now Creatinine 0.83 baseline, Cr 1.25---1.04 gradually improving  Renal sonogram  Near syncopal episode - CMP this morning unconcerning, BUN 11, not consistent with dehydration - Have encouraged p.o. intake - Suspect syncope secondary to pain meds and orthostasis - Check orthostatic vitals.   Hypokalemia, resolved - Replace as needed   Bipolar 1 disorder Depression with anxiety - Resume home meds   - Outpatient follow up for lifestyle modification and risk factor management   Hepatic medically antibiotic steatosis - Incidentally seen on CT. - Needs outpatient follow-up with PCP for surveillance lifestyle modifications   T12 fracture - Incidentally seen on CT.  Remote T12 superior endplate fracture with minimal height loss - PT/OT  Mild Anemia H 11.5 on 4/6 Iron profile and folic acid level within normal range   Obesity class II Body mass index is 38.98 kg/m.  Interventions: Calorie restricted diet and daily exercise advised to lose body weight.  Lifestyle modification discussed.   Diet: Regular diet DVT Prophylaxis: Subcutaneous Lovenox   Advance goals of care discussion: Full code  Family Communication: family was not present at bedside, at the time of interview.  The pt provided permission to discuss medical plan with the family. Opportunity was given to ask question and all questions were answered satisfactorily.   Disposition:  Pt is from home, admitted with UTI, still on IV antibiotics, which precludes a safe discharge. Discharge to home, when stable, most likely tomorrow a.m., awaiting for urine culture..  Subjective: No significant events overnight, patient is complaining of pain  in the right flank area which got worse during renal sonogram.  Patient feels pain in the abdomen while voiding.  Denied any other complaints, no  chest pain or palpitation, no shortness of breath.  Physical Exam: General: NAD, lying comfortably Appear in no distress, affect appropriate Eyes: PERRLA ENT: Oral Mucosa Clear, moist  Neck: no JVD,  Cardiovascular: S1 and S2 Present, no Murmur,  Respiratory: good respiratory effort, Bilateral Air entry equal and Decreased, no Crackles, no wheezes Abdomen: Bowel Sound present, Soft and right flank and back tenderness,  Skin: no rashes Extremities: no Pedal edema, no calf tenderness Neurologic: without any new focal findings Gait not checked due to patient safety concerns  Vitals:   11/10/23 1000 11/10/23 1100 11/10/23 1210 11/10/23 1305  BP: 111/68 107/71 115/74 (!) 152/87  Pulse: 63 (!) 59  77  Resp: 13 10 15 16   Temp:   98.3 F (36.8 C) 98.8 F (37.1 C)  TempSrc:   Oral Oral  SpO2: 98% 98% 99% 100%  Weight:      Height:        Intake/Output Summary (Last 24 hours) at 11/10/2023 1548 Last data filed at 11/09/2023 1720 Gross per 24 hour  Intake --  Output 725 ml  Net -725 ml   Filed Weights   11/08/23 2109  Weight: 103 kg    Data Reviewed: I have personally reviewed and interpreted daily labs, tele strips, imagings as discussed above. I reviewed all nursing notes, pharmacy notes, vitals, pertinent old records I have discussed plan of care as described above with RN and patient/family.  CBC: Recent Labs  Lab 11/05/23 2112 11/08/23 1945 11/09/23 0613 11/10/23 1119  WBC 10.8* 8.8 6.0 5.2  NEUTROABS  --  4.8  --   --   HGB 13.1 12.6 11.5* 12.6  HCT 38.0 36.8 33.6* 38.3  MCV 86.8 87.6 85.3 88.9  PLT 325 330 285 292   Basic Metabolic Panel: Recent Labs  Lab 11/05/23 2112 11/08/23 1945 11/09/23 0613 11/10/23 1105 11/10/23 1119  NA 136 134* 136 136  --   K 3.6 3.3* 3.8 4.3  --   CL 103 101 105 100  --   CO2 23 22 24 26   --   GLUCOSE 99 110* 92 95  --   BUN 8 13 11 9   --   CREATININE 1.01* 1.25* 1.04* 1.04*  --   CALCIUM 9.5 9.5 8.8* 9.7  --   MG  --   2.3  --   --  2.2  PHOS  --  2.7  --   --  2.7    Studies: US RENAL Result Date: 11/10/2023 CLINICAL DATA:  Acute kidney injury EXAM: RENAL / URINARY TRACT ULTRASOUND COMPLETE COMPARISON:  CT abdomen pelvis with contrast 11/08/2023 FINDINGS: Right Kidney: Renal measurements: 12.1 cm = volume: 131 mL. Echogenicity within normal limits. No mass or hydronephrosis visualized. Left Kidney: Renal measurements: 11.5 cm = volume: 147 mL. Echogenicity within normal limits. No mass or hydronephrosis visualized. Bladder: Appears normal for degree of bladder distention. Other: Diffuse increased echogenicity of the visualized portions of the hepatic parenchyma are a nonspecific indicator of hepatocellular dysfunction, most commonly steatosis. IMPRESSION: No significant sonographic abnormality of the kidneys. Electronically Signed   By: Acquanetta Belling M.D.   On: 11/10/2023 12:15    Scheduled Meds:  ARIPiprazole  30 mg Oral Q breakfast   busPIRone  10 mg Oral TID   clonazePAM  2 mg Oral QHS   enoxaparin (  LOVENOX) injection  50 mg Subcutaneous Q24H   estradiol  2 mg Oral Daily   lamoTRIgine  300 mg Oral QHS   lithium carbonate  300 mg Oral QHS   QUEtiapine  25 mg Oral QHS   traZODone  150 mg Oral QHS   Continuous Infusions:  ceFEPime (MAXIPIME) IV Stopped (11/10/23 1610)   PRN Meds: acetaminophen, HYDROmorphone (DILAUDID) injection, hydrOXYzine, OLANZapine zydis, ondansetron (ZOFRAN) IV, oxyCODONE-acetaminophen, promethazine, simethicone  Time spent: 35 minutes  Author: Gillis Santa. MD [facility] 11/10/2023 3:48 PM  To reach On-call, see care teams to locate the attending and reach out to them via [email or web address]. If 7PM-7AM, please contact night-coverage If you still have difficulty reaching the attending provider, please page the [facility] (Director on Call) for [facility] on amion for assistance.

## 2023-11-10 NOTE — ED Notes (Signed)
 Pt lying on left side resting with eyes closed, appears to be sleeping. Color appropriate for ethnicity. Respirations even, unlabored. NAD noted. Did not disturb.

## 2023-11-11 ENCOUNTER — Encounter: Payer: Self-pay | Admitting: Oncology

## 2023-11-11 ENCOUNTER — Other Ambulatory Visit: Payer: Self-pay

## 2023-11-11 DIAGNOSIS — N10 Acute tubulo-interstitial nephritis: Secondary | ICD-10-CM | POA: Diagnosis not present

## 2023-11-11 LAB — BASIC METABOLIC PANEL WITH GFR
Anion gap: 8 (ref 5–15)
BUN: 8 mg/dL (ref 6–20)
CO2: 25 mmol/L (ref 22–32)
Calcium: 9.7 mg/dL (ref 8.9–10.3)
Chloride: 105 mmol/L (ref 98–111)
Creatinine, Ser: 0.96 mg/dL (ref 0.44–1.00)
GFR, Estimated: >60 mL/min (ref >60)
Glucose, Bld: 95 mg/dL (ref 70–99)
Potassium: 3.8 mmol/L (ref 3.5–5.1)
Sodium: 138 mmol/L (ref 135–145)

## 2023-11-11 LAB — CBC
HCT: 36.5 % (ref 36.0–46.0)
Hemoglobin: 12.6 g/dL (ref 12.0–15.0)
MCH: 29.4 pg (ref 26.0–34.0)
MCHC: 34.5 g/dL (ref 30.0–36.0)
MCV: 85.1 fL (ref 80.0–100.0)
Platelets: 335 10*3/uL (ref 150–400)
RBC: 4.29 MIL/uL (ref 3.87–5.11)
RDW: 12.3 % (ref 11.5–15.5)
WBC: 6.3 10*3/uL (ref 4.0–10.5)
nRBC: 0 % (ref 0.0–0.2)

## 2023-11-11 LAB — MAGNESIUM: Magnesium: 2.1 mg/dL (ref 1.7–2.4)

## 2023-11-11 MED ORDER — BISACODYL 10 MG RE SUPP: 10.0000 mg | Freq: Every day | RECTAL | Status: DC | PRN

## 2023-11-11 MED ORDER — BISACODYL 5 MG PO TBEC
10.0000 mg | DELAYED_RELEASE_TABLET | Freq: Once | ORAL | Status: AC
  Administered 2023-11-11: 10 mg via ORAL
  Filled 2023-11-11: qty 2

## 2023-11-11 MED ORDER — VITAMIN D (ERGOCALCIFEROL) 1.25 MG (50000 UNIT) PO CAPS
50000.0000 [IU] | ORAL_CAPSULE | ORAL | 0 refills | Status: DC
Start: 2023-11-18 — End: 2023-12-02
  Filled 2023-11-11: qty 12, 84d supply, fill #0

## 2023-11-11 MED ORDER — POLYETHYLENE GLYCOL 3350 17 G PO PACK
17.0000 g | PACK | Freq: Two times a day (BID) | ORAL | Status: DC
  Administered 2023-11-11: 17 g via ORAL
  Filled 2023-11-11: qty 1

## 2023-11-11 MED ORDER — LEVOFLOXACIN 750 MG PO TABS
750.0000 mg | ORAL_TABLET | Freq: Every day | ORAL | 0 refills | Status: AC
  Filled 2023-11-11: qty 2, 2d supply, fill #1
  Filled 2023-11-11: qty 7, 7d supply, fill #0
  Filled 2023-11-14: qty 2, 2d supply, fill #1

## 2023-11-11 MED ORDER — VITAMIN B-12 1000 MCG PO TABS
500.0000 ug | ORAL_TABLET | Freq: Every day | ORAL | Status: DC
  Administered 2023-11-11: 500 ug via ORAL
  Filled 2023-11-11: qty 1

## 2023-11-11 MED ORDER — VITAMIN D (ERGOCALCIFEROL) 1.25 MG (50000 UNIT) PO CAPS
50000.0000 [IU] | ORAL_CAPSULE | ORAL | Status: DC
  Administered 2023-11-11: 50000 [IU] via ORAL
  Filled 2023-11-11: qty 1

## 2023-11-11 MED ORDER — BISACODYL 5 MG PO TBEC
10.0000 mg | DELAYED_RELEASE_TABLET | Freq: Every day | ORAL | 0 refills | Status: DC | PRN
  Filled 2023-11-11: qty 30, 15d supply, fill #0

## 2023-11-11 MED ORDER — BISACODYL 5 MG PO TBEC: 10.0000 mg | DELAYED_RELEASE_TABLET | Freq: Every day | ORAL | Status: DC

## 2023-11-11 MED ORDER — POLYETHYLENE GLYCOL 3350 17 GM/SCOOP PO POWD
17.0000 g | Freq: Two times a day (BID) | ORAL | 0 refills | Status: DC
  Filled 2023-11-11: qty 238, 7d supply, fill #0

## 2023-11-11 MED ORDER — CYANOCOBALAMIN 500 MCG PO TABS
500.0000 ug | ORAL_TABLET | Freq: Every day | ORAL | 0 refills | Status: AC
  Filled 2023-11-11: qty 90, 90d supply, fill #0

## 2023-11-11 NOTE — Discharge Summary (Signed)
 [facility] Discharge Summary   Patient: Anna Flores ZOX:[id number]  PCP: Margarita Mail, DO  Date of admission: 11/08/2023   Date of discharge:  11/11/2023     Discharge Diagnoses:  Principal Problem:   Acute pyelonephritis Active Problems:   AKI (acute kidney injury) (HCC)   Hypokalemia   Bipolar 1 disorder (HCC)   Depression with anxiety   Obesity (BMI 30-39.9)   Pyelonephritis   Admitted From: Home Disposition:  Home   Recommendations for Outpatient Follow-up:  Follow-up with PCP in 1 week.  Continue to biotics for 5 to 7 days, may stop after 5 days if no symptoms. Repeat vitamin D level and B12 level after 3 to 6 months. Follow up LABS/TEST:  As above   Follow-up Information     Margarita Mail, DO Follow up in 1 week(s).   Specialty: Internal Medicine Contact information: [address] [place] [address] [phone]                Diet recommendation: Cardiac diet  Activity: The patient is advised to gradually reintroduce usual activities, as tolerated  Discharge Condition: stable  Code Status: Full code   History of present illness: As per the H and P dictated on admission Hospital Course:  Anna Flores is 33 y.o. female with pression, anxiety, bipolar, obesity, endometriosis, status post hysterectomy on 3/17 who presents with dysuria and abdominal pain.  Patient reports after her hysterectomy she developed UTI and was initially treated with Macrobid though due to resistance she was switched to Bactrim on 3/31.  She reports no significant symptom improvement since that time.  She was seen on 4/2 in the ED and had CT abdomen pelvis which was negative for acute abnormalities.  She was continued on Bactrim and was still taking at the time of this admission.  Patient reports continued dysuria, urinary frequency, hematuria.  She also endorses nausea, vomiting, right flank pain. Chart review reveals patient had positive urine culture  on 3/25 with Enterobacter cloacae complex with resistance. In the ED patient had no leukocytosis, UA with leukocytes, nitrites, WBCs, AKI with creatinine of 1.2, vital signs stable.  CT abdomen pelvis negative for acute abnormalities.  She was started on meropenem and admitted.     Assessment and Plan:   # Acute pyelonephritis Patient has been on antibiotics since 3/14.  Concern for growing resistance.  Initially on Macrobid, switch to Bactrim 3/31 which she has been taking consistently. She has now failed outpatient oral antibiotic treatment. S/p cefepime IV given during hospital stay.  Pharmacy was consulted due to prior urine culture and increasing resistance with Enterobacter. S/p antiemetics and pain control prn given during hospital stay. Urine culture growing 30K staph epi.  4/7 right flank pain most likely musculoskeletal, Robaxin one-time dose given Patient was discharged on levofloxacin 750 mg p.o. daily for 7 days.  # AKI: Likely secondary to pyelonephritis, potentially worsened by Bactrim Creatinine 0.83 baseline, Cr 1.25---1.04 gradually improved s/p IVF Advised to continue oral hydration. Renal sonogram: No significant sonographic abnormality of the kidneys.    # Near syncopal episode: Most likely due to dehydration. Symptoms resolved, patient ambulated well without any complaints. IV fluids given, recommended to continue oral hydration.  # Hypokalemia, potassium repleted.  Resolved.   # Bipolar 1 disorder # Depression with anxiety - Resume home meds - Outpatient follow up for lifestyle modification and risk factor management   # Hepatic steatosis: Incidentally seen on CT. Needs outpatient follow-up with PCP for  surveillance lifestyle modifications   # T12 fracture: Incidentally seen on CT.  Remote T12 superior endplate fracture with minimal height loss. Vitamin D insufficiency: Vitamin D level 27,  started vitamin D 50,000 units p.o. weekly, follow with PCP to repeat  vitamin D level after 3 to 6 months. B12 level 336, goal >400.  Started oral supplement.    # Mild Anemia H 11.5 on 4/6 Iron profile and folic acid level within normal range    # Obesity class II Body mass index is 38.98 kg/m.  Interventions: Calorie restricted diet and daily exercise advised to lose body weight.  Lifestyle modification discussed.  Body mass index is 38.98 kg/m.  Nutrition Interventions:  - Patient was instructed, not to drive, operate heavy machinery, perform activities at heights, swimming or participation in water activities or provide baby sitting services while on Pain, Sleep and Anxiety Medications; until her outpatient Physician has advised to do so again.  - Also recommended to not to take more than prescribed Pain, Sleep and Anxiety Medications.  Patient was ambulatory without any assistance. On the day of the discharge the patient's vitals were stable, and no other acute medical condition were reported by patient. the patient was felt safe to be discharge at Home.  Consultants: None Procedures: None  Discharge Exam: General: Appear in no distress, no Rash; Oral Mucosa Clear, moist. Cardiovascular: S1 and S2 Present, no Murmur, Respiratory: normal respiratory effort, Bilateral Air entry present and no Crackles, no wheezes Abdomen: Bowel Sound present, Soft and no tenderness, no hernia Extremities: no Pedal edema, no calf tenderness Neurology: alert and oriented to time, place, and person affect appropriate.  Filed Weights   11/08/23 2109  Weight: 103 kg   Vitals:   11/11/23 0727 11/11/23 0903  BP: 135/70 117/65  Pulse: 71 63  Resp: 16 16  Temp: 98.2 F (36.8 C)   SpO2: 100% 100%    DISCHARGE MEDICATION: Allergies as of 11/11/2023       Reactions   Amoxicillin Hives   Hydrocodone Shortness Of Breath, Other (See Comments)   "loss of consciousness"; "felt really weird" when she was given 10 mgm hydrocodone. Did well with 5 mgm tablet    Vancomycin Itching   C/O head burning and itching   Reglan [metoclopramide] Anxiety, Other (See Comments)   Severe agitation/akathisia (reglan IV formulation)   Cherry Hives        Medication List     STOP taking these medications    albuterol 108 (90 Base) MCG/ACT inhaler Commonly known as: VENTOLIN HFA   docusate sodium 50 MG capsule Commonly known as: COLACE   estradiol 2 MG tablet Commonly known as: ESTRACE   nitrofurantoin (macrocrystal-monohydrate) 100 MG capsule Commonly known as: Macrobid   oxyCODONE-acetaminophen 5-325 MG tablet Commonly known as: Percocet   sulfamethoxazole-trimethoprim 800-160 MG tablet Commonly known as: BACTRIM DS       TAKE these medications    Accu-Chek Softclix Lancets lancets USE AS INSTRUCTED   ARIPiprazole 30 MG tablet Commonly known as: ABILIFY Take 30 mg by mouth in the morning.   bisacodyl 5 MG EC tablet Commonly known as: DULCOLAX Take 2 tablets (10 mg total) by mouth daily as needed for moderate constipation or severe constipation.   busPIRone 10 MG tablet Commonly known as: BUSPAR Take 10 mg by mouth 3 (three) times daily.   clonazePAM 2 MG tablet Commonly known as: KLONOPIN Take 2 mg by mouth at bedtime.   cyanocobalamin 500 MCG tablet Commonly known  as: VITAMIN B12 Take 1 tablet (500 mcg total) by mouth daily. Start taking on: November 12, 2023   HYDROcodone-acetaminophen 5-325 MG tablet Commonly known as: NORCO/VICODIN Take 1-2 tablets by mouth every 6 (six) hours as needed for moderate pain (pain score 4-6).   hydrOXYzine 25 MG tablet Commonly known as: ATARAX Take 25 mg by mouth every 6 (six) hours as needed for anxiety.   ibuprofen 800 MG tablet Commonly known as: ADVIL Take 1 tablet (800 mg total) by mouth every 8 (eight) hours as needed.   lamoTRIgine 150 MG tablet Commonly known as: LAMICTAL Take 300 mg by mouth at bedtime.   levofloxacin 750 MG tablet Commonly known as: Levaquin Take 1  tablet (750 mg total) by mouth daily for 7 days.   lithium carbonate 300 MG ER tablet Commonly known as: LITHOBID Take 300 mg by mouth at bedtime.   OLANZapine zydis 5 MG disintegrating tablet Commonly known as: ZYPREXA Take 5 mg by mouth 2 (two) times daily as needed (anxiety/agitation.).   polyethylene glycol 17 g packet Commonly known as: MIRALAX / GLYCOLAX Take 17 g by mouth 2 (two) times daily.   QUEtiapine 25 MG tablet Commonly known as: SEROQUEL Take 25 mg by mouth at bedtime.   simethicone 125 MG chewable tablet Commonly known as: MYLICON Chew 125 mg by mouth every 6 (six) hours as needed for flatulence.   traZODone 150 MG tablet Commonly known as: DESYREL Take 150 mg by mouth at bedtime.   Vitamin D (Ergocalciferol) 1.25 MG (50000 UNIT) Caps capsule Commonly known as: DRISDOL Take 1 capsule (50,000 Units total) by mouth every 7 (seven) days. Start taking on: November 18, 2023       Allergies  Allergen Reactions   Amoxicillin Hives   Hydrocodone Shortness Of Breath and Other (See Comments)    "loss of consciousness"; "felt really weird" when she was given 10 mgm hydrocodone. Did well with 5 mgm tablet   Vancomycin Itching    C/O head burning and itching   Reglan [Metoclopramide] Anxiety and Other (See Comments)    Severe agitation/akathisia (reglan IV formulation)   Cherry Hives   Discharge Instructions     Call MD for:  difficulty breathing, headache or visual disturbances   Complete by: As directed    Call MD for:  extreme fatigue   Complete by: As directed    Call MD for:  persistant dizziness or light-headedness   Complete by: As directed    Call MD for:  persistant nausea and vomiting   Complete by: As directed    Call MD for:  severe uncontrolled pain   Complete by: As directed    Call MD for:  temperature >100.4   Complete by: As directed    Diet - low sodium heart healthy   Complete by: As directed    Discharge instructions   Complete by: As  directed    Follow-up with PCP in 1 week.  Continue to biotics for 5 to 7 days, may stop after 5 days if no symptoms. Repeat vitamin D level and B12 level after 3 to 6 months.   Increase activity slowly   Complete by: As directed        The results of significant diagnostics from this hospitalization (including imaging, microbiology, ancillary and laboratory) are listed below for reference.    Significant Diagnostic Studies: US RENAL Result Date: 11/10/2023 CLINICAL DATA:  Acute kidney injury EXAM: RENAL / URINARY TRACT ULTRASOUND COMPLETE COMPARISON:  CT  abdomen pelvis with contrast 11/08/2023 FINDINGS: Right Kidney: Renal measurements: 12.1 cm = volume: 131 mL. Echogenicity within normal limits. No mass or hydronephrosis visualized. Left Kidney: Renal measurements: 11.5 cm = volume: 147 mL. Echogenicity within normal limits. No mass or hydronephrosis visualized. Bladder: Appears normal for degree of bladder distention. Other: Diffuse increased echogenicity of the visualized portions of the hepatic parenchyma are a nonspecific indicator of hepatocellular dysfunction, most commonly steatosis. IMPRESSION: No significant sonographic abnormality of the kidneys. Electronically Signed   By: Acquanetta Belling M.D.   On: 11/10/2023 12:15   CT ABDOMEN PELVIS W CONTRAST Result Date: 11/08/2023 CLINICAL DATA:  Right lower quadrant abdominal pain. Status post hysterectomy 10/20/2023. Urinary tract infection, dysuria. EXAM: CT ABDOMEN AND PELVIS WITH CONTRAST TECHNIQUE: Multidetector CT imaging of the abdomen and pelvis was performed using the standard protocol following bolus administration of intravenous contrast. RADIATION DOSE REDUCTION: This exam was performed according to the departmental dose-optimization program which includes automated exposure control, adjustment of the mA and/or kV according to patient size and/or use of iterative reconstruction technique. CONTRAST:  OMNIPAQUE IOHEXOL 300 MG/ML  SOLN  COMPARISON:  None Available. FINDINGS: Lower chest: No acute abnormality. Hepatobiliary: Mild hepatomegaly and hepatic steatosis. No enhancing intrahepatic mass. No intra or extrahepatic biliary ductal dilation. Gallbladder unremarkable. Pancreas: Unremarkable Spleen: Unremarkable Adrenals/Urinary Tract: Adrenal glands are unremarkable. Kidneys are normal, without renal calculi, focal lesion, or hydronephrosis. Bladder is unremarkable. Stomach/Bowel: Stomach is within normal limits. Appendix appears normal. No evidence of bowel wall thickening, distention, or inflammatory changes. Vascular/Lymphatic: No significant vascular findings are present. No enlarged abdominal or pelvic lymph nodes. Reproductive: Status post hysterectomy. No adnexal masses. Other: No abdominal wall hernia or abnormality. No abdominopelvic ascites. Musculoskeletal: No acute or significant osseous findings. Remote T12 superior endplate fracture with minimal loss of height anteriorly. IMPRESSION: 1. No acute intra-abdominal pathology identified. No definite radiographic explanation for the patient's reported symptoms. 2. Mild hepatomegaly and hepatic steatosis. Electronically Signed   By: Helyn Numbers M.D.   On: 11/08/2023 21:40   CT ABDOMEN PELVIS W CONTRAST Result Date: 11/05/2023 CLINICAL DATA:  Right lower quadrant pain, right flank pain EXAM: CT ABDOMEN AND PELVIS WITH CONTRAST TECHNIQUE: Multidetector CT imaging of the abdomen and pelvis was performed using the standard protocol following bolus administration of intravenous contrast. RADIATION DOSE REDUCTION: This exam was performed according to the departmental dose-optimization program which includes automated exposure control, adjustment of the mA and/or kV according to patient size and/or use of iterative reconstruction technique. CONTRAST:  OMNIPAQUE IOHEXOL 300 MG/ML  SOLN COMPARISON:  07/26/2021 FINDINGS: Lower chest: Dependent atelectasis in the lower lobes. No effusions.  Hepatobiliary: No focal hepatic abnormality. Gallbladder unremarkable. Pancreas: No focal abnormality or ductal dilatation. Spleen: No focal abnormality.  Normal size. Adrenals/Urinary Tract: No adrenal abnormality. No focal renal abnormality. No stones or hydronephrosis. Urinary bladder is unremarkable. Stomach/Bowel: Normal appendix. Stomach, large and small bowel grossly unremarkable. Vascular/Lymphatic: No evidence of aneurysm or adenopathy. Reproductive: Prior hysterectomy.  No adnexal masses. Other: No free fluid or free air. Musculoskeletal: No acute bony abnormality. IMPRESSION: Prior hysterectomy.  No complicating feature. No acute findings in the abdomen or pelvis. Electronically Signed   By: Charlett Nose M.D.   On: 11/05/2023 23:04    Microbiology: Recent Results (from the past 240 hours)  Urine Culture (for pregnant, neutropenic or urologic patients or patients with an indwelling urinary catheter)     Status: Abnormal   Collection Time: 11/08/23  9:10 PM  Specimen: Urine, Clean Catch  Result Value Ref Range Status   Specimen Description   Final    URINE, CLEAN CATCH Performed at [facility], [address], [place], [place] [address]    Special Requests   Final    NONE Performed at [facility], [address] [place], [place] [address]    Culture (A)  Final    30,000 COLONIES/mL STAPHYLOCOCCUS EPIDERMIDIS CALL MICROBIOLOGY LAB IF SENSITIVITIES ARE REQUIRED. Performed at [facility], [address] [place], [place] [address]    Report Status 11/10/2023 FINAL  Final     Labs: CBC: Recent Labs  Lab 11/05/23 2112 11/08/23 1945 11/09/23 0613 11/10/23 1119 11/11/23 0427  WBC 10.8* 8.8 6.0 5.2 6.3  NEUTROABS  --  4.8  --   --   --   HGB 13.1 12.6 11.5* 12.6 12.6  HCT 38.0 36.8 33.6* 38.3 36.5  MCV 86.8 87.6 85.3 88.9 85.1  PLT 325 330 285 292 335   Basic Metabolic Panel: Recent Labs  Lab 11/05/23 2112 11/08/23 1945 11/09/23 0613  11/10/23 1105 11/10/23 1119 11/11/23 0427  NA 136 134* 136 136  --  138  K 3.6 3.3* 3.8 4.3  --  3.8  CL 103 101 105 100  --  105  CO2 23 22 24 26   --  25  GLUCOSE 99 110* 92 95  --  95  BUN 8 13 11 9   --  8  CREATININE 1.01* 1.25* 1.04* 1.04*  --  0.96  CALCIUM 9.5 9.5 8.8* 9.7  --  9.7  MG  --  2.3  --   --  2.2 2.1  PHOS  --  2.7  --   --  2.7  --    Liver Function Tests: Recent Labs  Lab 11/05/23 2112 11/08/23 1945  AST 17 19  ALT 16 18  ALKPHOS 74 68  BILITOT 1.1 0.9  PROT 8.1 7.7  ALBUMIN 4.7 4.6   Recent Labs  Lab 11/05/23 2112  LIPASE 46   No results for input(s): "AMMONIA" in the last 168 hours. Cardiac Enzymes: No results for input(s): "CKTOTAL", "CKMB", "CKMBINDEX", "TROPONINI" in the last 168 hours. BNP (last 3 results) No results for input(s): "BNP" in the last 8760 hours. CBG: Recent Labs  Lab 11/09/23 1326  GLUCAP 120*    Time spent: 35 minutes  Signed:  Gillis Santa  [facility] 11/11/2023 1:18 PM

## 2023-11-11 NOTE — TOC CM/SW Note (Signed)
 Transition of Care [facility]) - Inpatient Brief Assessment   Patient Details  Name: Anna Flores MRN: [id number] Date of Birth: 1990/08/28  Transition of Care [facility]) CM/SW Contact:    Margarito Liner, LCSW Phone Number: 11/11/2023, 10:44 AM   Clinical Narrative: CSW reviewed chart. No TOC needs identified so far. CSW will continue to follow progress. Please place [facility] consult if any needs arise.  Transition of Care Asessment: Insurance and Status: Insurance coverage has been reviewed Patient has primary care physician: Yes Home environment has been reviewed: Single family home Prior level of function:: Not documented Prior/Current Home Services: No current home services Social Drivers of Health Review: SDOH reviewed no interventions necessary Readmission risk has been reviewed: Yes Transition of care needs: no transition of care needs at this time

## 2023-11-11 NOTE — Plan of Care (Signed)

## 2023-11-12 DIAGNOSIS — F250 Schizoaffective disorder, bipolar type: Secondary | ICD-10-CM | POA: Diagnosis not present

## 2023-11-12 DIAGNOSIS — F4312 Post-traumatic stress disorder, chronic: Secondary | ICD-10-CM | POA: Diagnosis not present

## 2023-11-12 DIAGNOSIS — F411 Generalized anxiety disorder: Secondary | ICD-10-CM | POA: Diagnosis not present

## 2023-11-14 ENCOUNTER — Other Ambulatory Visit: Payer: Self-pay

## 2023-11-14 ENCOUNTER — Encounter: Payer: Self-pay | Admitting: Internal Medicine

## 2023-11-14 ENCOUNTER — Ambulatory Visit: Admitting: Internal Medicine

## 2023-11-14 ENCOUNTER — Encounter: Payer: Self-pay | Admitting: Oncology

## 2023-11-14 VITALS — BP 126/78 | HR 74 | Temp 98.4°F | Resp 18 | Ht 64.0 in | Wt 220.8 lb

## 2023-11-14 DIAGNOSIS — N12 Tubulo-interstitial nephritis, not specified as acute or chronic: Secondary | ICD-10-CM | POA: Diagnosis not present

## 2023-11-14 DIAGNOSIS — Z09 Encounter for follow-up examination after completed treatment for conditions other than malignant neoplasm: Secondary | ICD-10-CM | POA: Diagnosis not present

## 2023-11-14 LAB — POCT URINALYSIS DIPSTICK
Bilirubin, UA: NEGATIVE
Blood, UA: NEGATIVE
Glucose, UA: NEGATIVE
Ketones, UA: NEGATIVE
Leukocytes, UA: NEGATIVE
Nitrite, UA: NEGATIVE
Protein, UA: NEGATIVE
Spec Grav, UA: 1.02 (ref 1.010–1.025)
Urobilinogen, UA: 0.2 U/dL
pH, UA: 5 (ref 5.0–8.0)

## 2023-11-14 NOTE — Progress Notes (Signed)
 Established Patient Office Visit  Subjective:  Patient ID: Anna Flores, female    DOB: 1991/01/20  Age: 33 y.o. MRN: [id number]  CC:  No chief complaint on file.   HPI Anna Flores presents for hospital follow up.   Discharge Date: 11/08/23 Diagnosis: right pyelonephritis, s/p hysterectomy in March Procedures/tests: CT A/P with normal kidneys, Renal US without significant abnormality. Labs normal but Urine culture growing staph epidermatitis  New medications: Levaquin x 5 days, on day 3 of treatment, Robaxin  Status: better, no more flank pain or dysuria, no fevers. Healing well from hysterectomy, has post-op appointment with Gynecology on 4/29   Past Medical History:  Diagnosis Date   Abdominal pain 10/26/2013   Anemia 2013   Anxiety    Back pain affecting pregnancy in third trimester 02/28/2020   Bipolar disorder (HCC)    Depression    History of Papanicolaou smear of cervix 10/02/2016   NEG   IDA (iron deficiency anemia) 02/29/2020   Indication for care in labor and delivery, antepartum 03/07/2020   Mastitis 01/19/2021   right   Menometrorrhagia 10/26/2013   PONV (postoperative nausea and vomiting)    Postpartum galactocele 02/01/2021   Pregnancy    DELIVERED 08/12/16 - [facility]    Past Surgical History:  Procedure Laterality Date   DIAGNOSTIC LAPAROSCOPY  2018   suspected ectopic pregnancy-[facility]   DILATION AND EVACUATION N/A 06/11/2021   Procedure: D & C;  Surgeon: Conard Novak, MD;  Location: [facility] ORS;  Service: Gynecology;  Laterality: N/A;   INCISION AND DRAINAGE ABSCESS Right 02/06/2021   Procedure: INCISION AND DRAINAGE ABSCESS;  Surgeon: Leafy Ro, MD;  Location: [facility] ORS;  Service: General;  Laterality: Right;   LAPAROSCOPIC VAGINAL HYSTERECTOMY WITH SALPINGO OOPHORECTOMY N/A 10/20/2023   Procedure: Laparoscopic Assisted Vaginal Hysterectomy with Bilateral Salpingo-oophorectomy;  Surgeon: Linzie Collin, MD;  Location: [facility] ORS;  Service: Gynecology;   Laterality: N/A;   LAPAROSCOPY N/A 06/11/2021   Procedure: LAPAROSCOPY DIAGNOSTIC;  Surgeon: Conard Novak, MD;  Location: [facility] ORS;  Service: Gynecology;  Laterality: N/A;   TONSILLECTOMY     XI ROBOTIC ASSISTED SALPINGECTOMY Bilateral 07/24/2021   Procedure: XI ROBOTIC ASSISTED SALPINGECTOMY;  Surgeon: [name], MD;  Location: [facility] ORS;  Service: Gynecology;  Laterality: Bilateral;    Family History  Adopted: Yes  Problem Relation Age of Onset   Nevi Sister        DISPLASTIC NEVUS   Ovarian cancer Mother    Cirrhosis Father     Social History   Socioeconomic History   Marital status: Married    Spouse name: [name]    Number of children: 5   Years of education: 3   Highest education level: Not on file  Occupational History   Occupation: HOMEMAKER  Tobacco Use   Smoking status: Never   Smokeless tobacco: Never  Vaping Use   Vaping status: Never Used  Substance and Sexual Activity   Alcohol use: Yes    Comment: occassional   Drug use: No   Sexual activity: Not Currently    Birth control/protection: Surgical    Comment: Vasectomy/BTL/hysterectomy 10/20/23  Other Topics Concern   Not on file  Social History Narrative   Not on file   Social Drivers of Health   Financial Resource Strain: Low Risk  (07/22/2018)   Overall Financial Resource Strain (CARDIA)    Difficulty of Paying Living Expenses: Not hard at all  Food Insecurity: No Food Insecurity (11/10/2023)  Hunger Vital Sign    Worried About Running Out of Food in the Last Year: Never true    Ran Out of Food in the Last Year: Never true  Transportation Needs: No Transportation Needs (11/10/2023)   PRAPARE - Administrator, Civil Service (Medical): No    Lack of Transportation (Non-Medical): No  Physical Activity: Insufficiently Active (07/22/2018)   Exercise Vital Sign    Days of Exercise per Week: 4 days    Minutes of Exercise per Session: 30 min  Stress: Stress Concern Present  (07/22/2018)   [employer] of Occupational Health - Occupational Stress Questionnaire    Feeling of Stress : Rather much  Social Connections: Moderately Integrated (11/10/2023)   Social Connection and Isolation Panel [NHANES]    Frequency of Communication with Friends and Family: More than three times a week    Frequency of Social Gatherings with Friends and Family: More than three times a week    Attends Religious Services: Never    Database administrator or Organizations: Yes    Attends Banker Meetings: Never    Marital Status: Married  Catering manager Violence: Not At Risk (11/10/2023)   Humiliation, Afraid, Rape, and Kick questionnaire    Fear of Current or Ex-Partner: No    Emotionally Abused: No    Physically Abused: No    Sexually Abused: No    ROS Review of Systems  Constitutional:  Negative for chills and fever.  Gastrointestinal:  Negative for abdominal pain.  Genitourinary:  Negative for dysuria, flank pain, frequency and hematuria.    Objective:   Today's Vitals: BP 126/78   Pulse 74   Temp 98.4 F (36.9 C)   Resp 18   Ht 5\' 4"  (1.626 m)   Wt 220 lb 12.8 oz (100.2 kg)   LMP 10/02/2023 (Approximate)   SpO2 100%   BMI 37.90 kg/m   Physical Exam Constitutional:      Appearance: Normal appearance.  HENT:     Head: Normocephalic and atraumatic.  Eyes:     Conjunctiva/sclera: Conjunctivae normal.  Cardiovascular:     Rate and Rhythm: Normal rate and regular rhythm.  Pulmonary:     Effort: Pulmonary effort is normal.     Breath sounds: Normal breath sounds.  Abdominal:     Tenderness: There is no right CVA tenderness or left CVA tenderness.  Skin:    General: Skin is warm and dry.  Neurological:     General: No focal deficit present.     Mental Status: She is alert. Mental status is at baseline.  Psychiatric:        Mood and Affect: Mood normal.        Behavior: Behavior normal.     Assessment & Plan:   1. Hospital discharge  follow-up (Primary)/Pyelonephritis: Reviewed hospital discharge summary from 11/11/23, imaging and labs. UA here clear, will complete full 5 days of Levaquin.   - POCT Urinalysis Dipstick  Follow-up: Return in about 6 months (around 05/15/2024).   Margarita Mail, DO

## 2023-11-17 NOTE — Addendum Note (Signed)
 Addended by: Rockney Cid on: 11/17/2023 07:49 AM   Modules accepted: Level of Service

## 2023-11-18 DIAGNOSIS — F411 Generalized anxiety disorder: Secondary | ICD-10-CM | POA: Diagnosis not present

## 2023-11-18 DIAGNOSIS — F250 Schizoaffective disorder, bipolar type: Secondary | ICD-10-CM | POA: Diagnosis not present

## 2023-11-18 DIAGNOSIS — F4312 Post-traumatic stress disorder, chronic: Secondary | ICD-10-CM | POA: Diagnosis not present

## 2023-11-18 DIAGNOSIS — F3112 Bipolar disorder, current episode manic without psychotic features, moderate: Secondary | ICD-10-CM | POA: Diagnosis not present

## 2023-11-25 DIAGNOSIS — F411 Generalized anxiety disorder: Secondary | ICD-10-CM | POA: Diagnosis not present

## 2023-11-25 DIAGNOSIS — F3112 Bipolar disorder, current episode manic without psychotic features, moderate: Secondary | ICD-10-CM | POA: Diagnosis not present

## 2023-11-25 DIAGNOSIS — F4312 Post-traumatic stress disorder, chronic: Secondary | ICD-10-CM | POA: Diagnosis not present

## 2023-11-25 DIAGNOSIS — F250 Schizoaffective disorder, bipolar type: Secondary | ICD-10-CM | POA: Diagnosis not present

## 2023-12-02 ENCOUNTER — Ambulatory Visit (INDEPENDENT_AMBULATORY_CARE_PROVIDER_SITE_OTHER): Admitting: Obstetrics and Gynecology

## 2023-12-02 ENCOUNTER — Encounter: Payer: Self-pay | Admitting: Obstetrics and Gynecology

## 2023-12-02 VITALS — BP 116/80 | HR 76 | Ht 64.0 in | Wt 227.5 lb

## 2023-12-02 DIAGNOSIS — F411 Generalized anxiety disorder: Secondary | ICD-10-CM | POA: Diagnosis not present

## 2023-12-02 DIAGNOSIS — F3112 Bipolar disorder, current episode manic without psychotic features, moderate: Secondary | ICD-10-CM | POA: Diagnosis not present

## 2023-12-02 DIAGNOSIS — F250 Schizoaffective disorder, bipolar type: Secondary | ICD-10-CM | POA: Diagnosis not present

## 2023-12-02 DIAGNOSIS — Z79890 Hormone replacement therapy (postmenopausal): Secondary | ICD-10-CM

## 2023-12-02 DIAGNOSIS — F4312 Post-traumatic stress disorder, chronic: Secondary | ICD-10-CM | POA: Diagnosis not present

## 2023-12-02 DIAGNOSIS — Z9889 Other specified postprocedural states: Secondary | ICD-10-CM

## 2023-12-02 MED ORDER — LEVONORGEST-ETH ESTRAD 91-DAY 0.15-0.03 &0.01 MG PO TABS: 1.0000 | ORAL_TABLET | Freq: Every day | ORAL | 1 refills | Status: DC
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
## 2023-12-02 NOTE — Progress Notes (Signed)
 Patient presents for 6 week postop follow-up following LAVH/BSO. She states doing much better than before. She has been taking daily OCP's and doing well with them.

## 2023-12-05 ENCOUNTER — Encounter: Payer: Self-pay | Admitting: Internal Medicine

## 2023-12-05 NOTE — Telephone Encounter (Unsigned)
 Copied from CRM [id number]. Topic: Clinical - Medication Question >> Dec 05, 2023  3:49 PM Star East wrote: Reason for CRM: Asking for a prescription for a Dexcom monitoring device- [phone]

## 2023-12-08 ENCOUNTER — Telehealth: Payer: Self-pay | Admitting: Internal Medicine

## 2023-12-08 ENCOUNTER — Other Ambulatory Visit: Payer: Self-pay | Admitting: Internal Medicine

## 2023-12-08 DIAGNOSIS — E161 Other hypoglycemia: Secondary | ICD-10-CM

## 2023-12-08 MED ORDER — DEXCOM G7 SENSOR MISC
1.0000 | 3 refills | Status: AC
Start: 2023-12-08 — End: ?

## 2023-12-08 MED ORDER — DEXCOM G7 RECEIVER DEVI: 1.0000 | Freq: Every day | 0 refills | Status: AC

## 2023-12-08 NOTE — Telephone Encounter (Signed)
 Copied from CRM [id number]. Topic: Clinical - Prescription Issue >> Dec 08, 2023 12:21 PM Stanly Early wrote: Reason for CRM: would like an appeal for Continuous Glucose Receiver (DEXCOM G7 RECEIVER) DEVI, patient would like communication through mychart

## 2023-12-09 ENCOUNTER — Telehealth: Payer: Self-pay | Admitting: Pharmacy Technician

## 2023-12-09 ENCOUNTER — Other Ambulatory Visit (HOSPITAL_COMMUNITY): Payer: Self-pay

## 2023-12-09 ENCOUNTER — Encounter: Payer: Self-pay | Admitting: Oncology

## 2023-12-09 DIAGNOSIS — F250 Schizoaffective disorder, bipolar type: Secondary | ICD-10-CM | POA: Diagnosis not present

## 2023-12-09 DIAGNOSIS — F3112 Bipolar disorder, current episode manic without psychotic features, moderate: Secondary | ICD-10-CM | POA: Diagnosis not present

## 2023-12-09 DIAGNOSIS — F4312 Post-traumatic stress disorder, chronic: Secondary | ICD-10-CM | POA: Diagnosis not present

## 2023-12-09 DIAGNOSIS — F411 Generalized anxiety disorder: Secondary | ICD-10-CM | POA: Diagnosis not present

## 2023-12-09 NOTE — Telephone Encounter (Signed)
 Pharmacy Patient Advocate Encounter   Received notification from Onbase that prior authorization for Dexcom G7 Sensor is required/requested.   Insurance verification completed.   The patient is insured through [facility] .   Per test claim: PA required; PA submitted to above mentioned insurance via CoverMyMeds Key/confirmation #/EOC [id number] Status is pending

## 2023-12-09 NOTE — Telephone Encounter (Signed)
 Pharmacy Patient Advocate Encounter  Received notification from [facility] that Prior Authorization for Dexcom G7 Sensor has been DENIED.  Full denial letter will be uploaded to the media tab. See denial reason below.     PA #/Case ID/Reference #: [id number]

## 2023-12-10 NOTE — Telephone Encounter (Signed)
 Spoke with insurance company, patient do not have a diagnosis that is warrant for them to pay this device. Must be diabetic  or insulin  dependent or gestational diabetes

## 2023-12-15 DIAGNOSIS — F4312 Post-traumatic stress disorder, chronic: Secondary | ICD-10-CM | POA: Diagnosis not present

## 2023-12-15 DIAGNOSIS — F411 Generalized anxiety disorder: Secondary | ICD-10-CM | POA: Diagnosis not present

## 2023-12-15 DIAGNOSIS — F250 Schizoaffective disorder, bipolar type: Secondary | ICD-10-CM | POA: Diagnosis not present

## 2023-12-16 DIAGNOSIS — F411 Generalized anxiety disorder: Secondary | ICD-10-CM | POA: Diagnosis not present

## 2023-12-16 DIAGNOSIS — F250 Schizoaffective disorder, bipolar type: Secondary | ICD-10-CM | POA: Diagnosis not present

## 2023-12-16 DIAGNOSIS — F4312 Post-traumatic stress disorder, chronic: Secondary | ICD-10-CM | POA: Diagnosis not present

## 2023-12-16 DIAGNOSIS — F3112 Bipolar disorder, current episode manic without psychotic features, moderate: Secondary | ICD-10-CM | POA: Diagnosis not present

## 2023-12-17 DIAGNOSIS — F29 Unspecified psychosis not due to a substance or known physiological condition: Secondary | ICD-10-CM | POA: Diagnosis not present

## 2023-12-17 DIAGNOSIS — F322 Major depressive disorder, single episode, severe without psychotic features: Secondary | ICD-10-CM | POA: Diagnosis not present

## 2023-12-18 DIAGNOSIS — F322 Major depressive disorder, single episode, severe without psychotic features: Secondary | ICD-10-CM | POA: Diagnosis not present

## 2023-12-19 ENCOUNTER — Telehealth: Payer: Self-pay

## 2023-12-19 ENCOUNTER — Telehealth: Payer: Self-pay | Admitting: *Deleted

## 2023-12-19 DIAGNOSIS — E162 Hypoglycemia, unspecified: Secondary | ICD-10-CM

## 2023-12-19 NOTE — Progress Notes (Signed)
 Complex Care Management Note Care Guide Note  12/19/2023 Name: Faraday Balster MRN: [id number] DOB: 03/05/1991   Complex Care Management Outreach Attempts: An unsuccessful telephone outreach was attempted today to offer the patient information about available complex care management services.  Follow Up Plan:  Additional outreach attempts will be made to offer the patient complex care management information and services.   Encounter Outcome:  No Answer  Barnie Bora  [facility]  [facility], [facility] Guide  Direct Dial: [phone]  Fax [phone]

## 2023-12-23 DIAGNOSIS — F250 Schizoaffective disorder, bipolar type: Secondary | ICD-10-CM | POA: Diagnosis not present

## 2023-12-23 DIAGNOSIS — F411 Generalized anxiety disorder: Secondary | ICD-10-CM | POA: Diagnosis not present

## 2023-12-23 DIAGNOSIS — F3112 Bipolar disorder, current episode manic without psychotic features, moderate: Secondary | ICD-10-CM | POA: Diagnosis not present

## 2023-12-23 DIAGNOSIS — F4312 Post-traumatic stress disorder, chronic: Secondary | ICD-10-CM | POA: Diagnosis not present

## 2023-12-24 NOTE — Progress Notes (Signed)
 Complex Care Management Note  Care Guide Note 12/24/2023 Name: Anna Flores MRN: [id number] DOB: [birth year]  Anna Flores is a 33 y.o. year old female who sees Rockney Cid, DO for primary care. I reached out to [place] by phone today to offer complex care management services.  Ms. Buell was given information about Complex Care Management services today including:   The Complex Care Management services include support from the care team which includes your Nurse Care Manager, Clinical Social Worker, or Pharmacist.  The Complex Care Management team is here to help remove barriers to the health concerns and goals most important to you. Complex Care Management services are voluntary, and the patient may decline or stop services at any time by request to their care team member.   Complex Care Management Consent Status: Patient agreed to services and verbal consent obtained.   Follow up plan:  Telephone appointment with complex care management team member scheduled for:  01/01/24  Encounter Outcome:  Patient Scheduled  Barnie Bora  [facility]  [facility], [facility] Guide  Direct Dial: [phone]  Fax [phone]

## 2023-12-27 DIAGNOSIS — F314 Bipolar disorder, current episode depressed, severe, without psychotic features: Secondary | ICD-10-CM | POA: Diagnosis not present

## 2023-12-30 DIAGNOSIS — F411 Generalized anxiety disorder: Secondary | ICD-10-CM | POA: Diagnosis not present

## 2023-12-30 DIAGNOSIS — F250 Schizoaffective disorder, bipolar type: Secondary | ICD-10-CM | POA: Diagnosis not present

## 2023-12-30 DIAGNOSIS — F3112 Bipolar disorder, current episode manic without psychotic features, moderate: Secondary | ICD-10-CM | POA: Diagnosis not present

## 2023-12-30 DIAGNOSIS — F4312 Post-traumatic stress disorder, chronic: Secondary | ICD-10-CM | POA: Diagnosis not present

## 2023-12-31 DIAGNOSIS — F411 Generalized anxiety disorder: Secondary | ICD-10-CM | POA: Diagnosis not present

## 2023-12-31 DIAGNOSIS — F4312 Post-traumatic stress disorder, chronic: Secondary | ICD-10-CM | POA: Diagnosis not present

## 2023-12-31 DIAGNOSIS — F250 Schizoaffective disorder, bipolar type: Secondary | ICD-10-CM | POA: Diagnosis not present

## 2024-01-01 ENCOUNTER — Other Ambulatory Visit: Payer: Self-pay | Admitting: *Deleted

## 2024-01-01 ENCOUNTER — Other Ambulatory Visit: Payer: Self-pay

## 2024-01-01 ENCOUNTER — Telehealth: Admitting: *Deleted

## 2024-01-01 ENCOUNTER — Encounter: Payer: Self-pay | Admitting: *Deleted

## 2024-01-01 VITALS — Wt 220.0 lb

## 2024-01-01 DIAGNOSIS — F418 Other specified anxiety disorders: Secondary | ICD-10-CM

## 2024-01-01 DIAGNOSIS — F319 Bipolar disorder, unspecified: Secondary | ICD-10-CM

## 2024-01-01 DIAGNOSIS — Z5986 Financial insecurity: Secondary | ICD-10-CM

## 2024-01-01 NOTE — Patient Instructions (Signed)
 Visit Information  Anna Flores was given information about Medicaid Managed Care team care coordination services as a part of their Healthy [facility] Medicaid benefit. Anna Flores verbally consented to engagement with the [facility] Managed Care team.   If you are experiencing a medical emergency, please call 911 or report to your local emergency department or urgent care.   If you have a non-emergency medical problem during routine business hours, please contact your provider's office and ask to speak with a nurse.   For questions related to your Healthy [facility] health plan, please call: [phone] or visit the homepage here: [email or web address]  If you would like to schedule transportation through your Healthy [facility] plan, please call the following number at least 2 days in advance of your appointment: [phone]  For information about your ride after you set it up, call Ride Assist at [phone]. Use this number to activate a Will Call pickup, or if your transportation is late for a scheduled pickup. Use this number, too, if you need to make a change or cancel a previously scheduled reservation.  If you need transportation services right away, call [phone]. The after-hours call center is staffed 24 hours to handle ride assistance and urgent reservation requests (including discharges) 365 days a year. Urgent trips include sick visits, hospital discharge requests and life-sustaining treatment.  Call the [facility] Line at [phone], at any time, 24 hours a day, 7 days a week. If you are in danger or need immediate medical attention call 911.  If you would like help to quit smoking, call 1-800-QUIT-NOW ((704)348-9023) OR Espaol: 1-855-Djelo-Ya (1-884-166-0630) o para ms informacin haga clic aqu or Text READY to 160-109 to register via text  Ms. Borkenhagen - following are the goals we discussed in your visit today:   Goals Addressed              This Visit's Progress    VBCI RN Care Plan: Financial & Food Insecurity, Hypoglycemia, Behavioral Health Needs   On track    Problems:  Care Coordination needs related to Financial Strain  and Food Insecurity  Chronic Disease Management support and education needs related to hypoglycemia No Advanced Directives in place Medication side effects Withdrawing from Abilify  (D/C by provider) Started on Caplyta which has decreased her appetite and subsequently caused weight loss of 8 lbs in a week. Prescriber aware.   Goal: Over the next 3 months the Anna Flores will demonstrate Improved health management independence as evidenced by eating meals regularly to maintain a normal blood sugar range as evidenced by chart review and Anna Flores report.         Over the next 3 months, the Anna Flores will maintain consistent contact with her behavioral health specialists in order to avoid exacerbations and hospitalizations as much as possible as evidenced by chart review and Anna Flores report Over the next 7 days, Anna Flores will talk with Care Management BSW regarding resource needs: food insecurity and financial strain that affects paying for medication copays and utilities.  Over the next 7 days, Anna Flores will talk with RN Care Manager regarding chronic care management needs Over the next 7 days, Anna Flores will reach out to providers or seek emergency attention for any new, worsening, or persistent behavioral health needs  Interventions:   SDOH Needs: Evaluated SDOH needs and identified areas of concern Food insecurity, social isolation, financial strain that affects ability to pay medication copays and utilities, caregiver strain (provides primary care for her 5 children and her disabled father  who lives with her, her husband, and her children) Assessed children's access to food at school. 3 of the 5 children receive free lunch and breakfast at school. Unsure if the school system will provide lunches during  the summer.  Assessed for SNAP benefits. Receives $400 per month for household of 8 Assessed total family income: $3600 per month for household of 8. Anna Flores does not receive SSI but is applying.  Utilize list of food pantries that was provided at child's pediatrician visit today Discussed stress and caregiver strain and options for personal care services and in-home care for her disabled father with Medicaid. Per Anna Flores, father has declined and will decline offers for in-home care.  Collaborated with BSW and scheduled appointment for 01/06/24  Behavioral Health Conditions: Discussed Advance Directives and mailed packet. Advised how to complete and asked to return to PCP office.  Encouraged Anna Flores to keep all appointments with her mental health professionals Advised to reach out to provider with any new or worsening symptoms.  Advised to seek emergency medical attention if necessary. Can call 911 if necessary.  PHQ-9 and GAD completed Positive for suicidal thoughts over the past 2 weeks. Denies any plan or intent.  Denies homicidal thoughts Discussed recent hospitalization and behavioral health visits for suicidal thoughts within the past 2 weeks Encouraged to utilize breathing techniques provided by behavioral health and other distraction techniques Questioned about safety and intent today.  Anna Flores denies having any plans for suicide and verbalizes that she will ask for help if needed. She has a history of asking for help and accessing resources when needed.  Added psychiatric nurse practitioner and contact information to Care Team  Hypoglycemia: Discussed brief pathophysiology of reactive hypoglycemia Reviewed and discussed medications and side effects Encouraged to check and record weight every few days, in the morning, with about the same amount of clothes on and to reach out to provider if she continues to lose weight Discussed importance of eating meals at regular intervals and to  include complex carbohydrates and protein. Recognize that food insecurity is a barrier.  Keep glucose tablets or hard candy on hand  Discussed blood sugar monitoring.  Dexcom was denied by Medicaid. Anna Flores is not diagnosed as diabetic and is not on insulin .  Has glucometer and will check as needed Discussed symptoms of hypoglycemia Anna Flores is aware of symptoms and can tell when her blood sugar is too low Lowest reading recently was in the 40s Sent information on hypoglycemia management through My Chart (Anna Flores preferred over mailed materials) Encouraged to reach out to provider with any new or worsening symptoms Reach out to RN Care Manager at [phone] as needed  Anna Flores Self-Care Activities:  Attend all scheduled provider appointments Call provider office for new concerns or questions  Take medications as prescribed   Work with the social worker to address care coordination needs and will continue to work with the clinical team to address health care and disease management related needs call the [place]  National Suicide Prevention Lifeline: (971)634-4692 or TTY: (307)548-2207 TTY 343-108-0309) to talk to a trained counselor call 911 if experiencing a Mental Health or Behavioral Health Crisis Reach out to established behavioral health providers for non-emergencies Eat 3 meals per day and keep glucose tablets or hard candy with her for hypoglycemic episodes  Plan:  Telephone follow up appointment with care management team member scheduled for:  01/06/24 with Valora Gear, BSW and 01/09/24 with RN Care Manager  Please see education materials related to hypoglycemia provided by MyChart link. and by mail.   Anna Flores verbalizes understanding of instructions and care plan provided today and agrees to view in MyChart. Active MyChart status and Anna Flores understanding of how to access instructions and care plan via MyChart confirmed with Anna Flores.     RN Care Manager will call  Anna Flores on 01/09/24 at 1:00  Social Worker will call Anna Flores on 01/06/24 at 2:30 .   Michele Ahle, RN, BSN [place]  [facility] RN Care Manager Direct Dial: [phone]  Fax: [phone]    Following is a copy of your plan of care:  There are no care plans that you recently modified to display for this Anna Flores.

## 2024-01-01 NOTE — Patient Outreach (Signed)
 Complex Care Management   Visit Note  01/01/2024  Name:  Anna Flores MRN: [id number] DOB: 1990/11/22  Situation: Referral received for Complex Care Management related to Mental/Behavioral Health diagnosis schizoaffective disorder, bipolar 1 disorder, anxiety, depression and SDOH Barriers:  Food insecurity Social Metallurgist Strain I obtained verbal consent from Patient.  Visit completed with Lenton Rail the phone  Background:   Past Medical History:  Diagnosis Date   Abdominal pain 10/26/2013   Anemia 2013   Anxiety    Back pain affecting pregnancy in third trimester 02/28/2020   Bipolar disorder (HCC)    Depression    History of Papanicolaou smear of cervix 10/02/2016   NEG   IDA (iron  deficiency anemia) 02/29/2020   Indication for care in labor and delivery, antepartum 03/07/2020   Mastitis 01/19/2021   right   Menometrorrhagia 10/26/2013   PONV (postoperative nausea and vomiting)    Postpartum galactocele 02/01/2021   Pregnancy    DELIVERED 08/12/16 - JEG    Assessment: Patient Reported Symptoms:  Cognitive Cognitive Status: Alert and oriented to person, place, and time, Normal speech and language skills   Health Maintenance Behaviors: Annual physical exam (Counseling) Healing Pattern: Unsure Health Facilitated by:  (Counseling and medication management)  Neurological Neurological Review of Symptoms: No symptoms reported    HEENT HEENT Symptoms Reported: Not assessed      Cardiovascular Cardiovascular Symptoms Reported: Not assessed Weight: 220 lb (99.8 kg)  Respiratory Respiratory Symptoms Reported: Not assesed    Endocrine Patient reports the following symptoms related to hypoglycemia or hyperglycemia : Shakiness, Weight loss, Nausea or vomiting, Fast heartbeat, Other Other symptoms related to hypoglycemia or hyperglycemia: lightheaded and syncopal episodes Is patient diabetic?: No Endocrine Conditions: Hypoglycemia (nondiabetic) Endocrine  Management Strategies: Routine screening, Diet modification Endocrine Self-Management Outcome: 3 (uncertain) Endocrine Comment: educational information on hypoglycemia management sent through My Chart  Gastrointestinal Gastrointestinal Symptoms Reported: Change in appetite Additional Gastrointestinal Details: decreased appetite since starting Caplyta while hospitalized a couple weeks ago Gastrointestinal Management Strategies: Diet modification Gastrointestinal Self-Management Outcome: 4 (good) Gastrointestinal Comment: reports losing 8 lbs within a week of startyin Caplyta. Saw prescriber yesterday and they are aware. I recommended that she check and record her weight every few days, in the morning, with about the same amount of clothes on so that she can monitor for stablization or continued loss. She should notify her provider if she continues to lose weight. Encouraged to eat 3 meals per day. Food insecurity is a barrier as well. Child's pediatrician provided them with a list of local food pantries at visit today. She will utilize them this weekend. Referrd to Care Managemetn BSW as well. Nutrition Risk Screen (CP): Reduced oral intake over the last month  Genitourinary Genitourinary Symptoms Reported: No symptoms reported Additional Genitourinary Details: Hospitalized for Pyelonephritis on 11/08/23. Treated with antibiotics. Followed up with PCP after discharge. Symptoms resolved. Genitourinary Self-Management Outcome: 4 (good) Genitourinary Comment: Advised to see PCP or seek medical attention for any future UTI symptoms. Drink plenty of water.  Integumentary Integumentary Symptoms Reported: No symptoms reported    Musculoskeletal Musculoskelatal Symptoms Reviewed: No symptoms reported   Falls in the past year?: No Number of falls in past year: 1 or less Was there an injury with Fall?: No Fall Risk Category Calculator: 0 Patient Fall Risk Level: Low Fall Risk    Psychosocial Psychosocial  Symptoms Reported: Anxiety - if selected complete GAD, Depression - if selected complete PHQ 2-9 Additional Psychological Details: Bipolar 1  disorder, schizoaffective disorder. Recent hospitalization for 6 days at behavioral health facility for suicidal ideations and depressive disorder, per patient. Records unavailable for review. 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 Father is easy to get along with but she has a lot to manage.) Behaviors When Feeling Stressed/Fearful: Can sometimes go in her room to take a few minutes, but it's difficult with small children at home. Counselor has gone over breathing techniques to help calm and ground her. Talks with counselor through [facility] weekly and medication is managed by psychiatric nurse practitioner at [facility] as well. Also accesses [facility] for behavioral health management as needed. Techniques to Cope with Loss/Stress/Change: Medication, Counseling, Diversional activities Quality of Family Relationships: helpful (husband is helpful when home from work. Father can be stressful.) Do you feel physically threatened by others?: No   PHQ2-9 Depression Screening   Little interest or pleasure in doing things Nearly every day  Feeling down, depressed, or hopeless Nearly every day  PHQ-2 - Total Score 6  Trouble falling or staying asleep, or sleeping too much Several days  Feeling tired or having little energy More than half the days  Poor appetite or  overeating  Nearly every day  Feeling bad about yourself - or that you are a failure or have let yourself or your family down Nearly every day  Trouble concentrating on things, such as reading the newspaper or watching television Nearly every day  Moving or speaking so slowly that other people could have noticed.  Or the opposite - being so fidgety or restless that you have been moving around a lot more than usual Nearly every day  Thoughts that you would be better off dead, or hurting yourself in some way More than half the days  PHQ2-9 Total Score 23  If you checked off any problems, how difficult have these problems made it for you to do your work, take care of things at home, or get along with other people Extremely dIfficult  Depression Interventions/Treatment Counseling, Currently on Treatment, Medication    Weight: 220 lbs There were no vitals filed for this visit.  Medications Reviewed Today     Reviewed by Ethelene Herald, RN (Registered Nurse) on 01/01/24 at [phone]  Med List Status: <None>   Medication Order Taking? Sig Documenting Provider Last Dose Status Informant  Accu-Chek Softclix Lancets lancets [id number] Yes USE AS INSTRUCTED Rockney Cid, DO Taking Active Self  ARIPiprazole  (ABILIFY ) 30 MG tablet [id number] No Take 30 mg by mouth in the morning.  Patient not taking: Reported on 01/01/2024   [provider] Not Taking Active Self  busPIRone  (BUSPAR ) 10 MG tablet [id number] Yes Take 10 mg by mouth 3 (three) times daily. [provider] Taking Active Self  clonazePAM  (KLONOPIN ) 2 MG tablet [id number] Yes Take 2 mg by mouth at bedtime. [provider] Taking Active Self  Continuous Glucose Receiver (DEXCOM G7 RECEIVER) DEVI [id number] No 1 each by Does not apply route daily.  Patient not taking: Reported on 01/01/2024   Rockney Cid, DO Not Taking Active   Continuous Glucose Sensor (DEXCOM G7 SENSOR) MISC [id number] No 1 each by Does not apply  route  every 14 (fourteen) days.  Patient not taking: Reported on 01/01/2024   Rockney Cid, DO Not Taking Active   cyanocobalamin  (VITAMIN B12) 500 MCG tablet [id number] Yes Take 1 tablet (500 mcg total) by mouth daily. Althia Atlas, MD Taking Active   hydrOXYzine  (ATARAX ) 25 MG tablet [id number] Yes Take 25 mg by mouth every 6 (six) hours as needed for anxiety. [provider] Taking Active Self  lamoTRIgine  (LAMICTAL ) 150 MG tablet [id number] Yes Take 300 mg by mouth at bedtime. [provider] Taking Active Self  Levonorgestrel -Ethinyl Estradiol  (AMETHIA) 0.15-0.03 &0.01 MG tablet [id number] Yes Take 1 tablet by mouth at bedtime. Zenobia Hila, MD Taking Active   lithium  carbonate (LITHOBID ) 300 MG ER tablet [id number] No Take 300 mg by mouth at bedtime.  Patient not taking: Reported on 01/01/2024   [provider] Not Taking Active Self  lumateperone tosylate (CAPLYTA) 42 MG capsule [id number] Yes Take 42 mg by mouth daily. [provider] Taking Active Self  OLANZapine  zydis (ZYPREXA ) 5 MG disintegrating tablet [id number] Yes Take 5 mg by mouth 2 (two) times daily as needed (anxiety/agitation.). [provider] Taking Active Self  QUEtiapine  (SEROQUEL ) 25 MG tablet [id number] Yes Take 25 mg by mouth at bedtime. [provider] Taking Active Self  traZODone  (DESYREL ) 150 MG tablet [id number] No Take 150 mg by mouth at bedtime.  Patient not taking: Reported on 01/01/2024   [provider] Not Taking Active Self            Recommendation:   Specialty provider follow-up counselor with [facility] Referral to: Care Management BSW for assistance with resources  Follow Up Plan:   Telephone follow up appointment date/time:  01/09/24 at 1:00  Michele Ahle, RN, BSN [place]  [facility] RN Care Manager Direct Dial: [phone]  Fax: [phone]

## 2024-01-02 ENCOUNTER — Other Ambulatory Visit: Payer: Self-pay

## 2024-01-06 ENCOUNTER — Telehealth: Payer: Self-pay

## 2024-01-06 DIAGNOSIS — F250 Schizoaffective disorder, bipolar type: Secondary | ICD-10-CM | POA: Diagnosis not present

## 2024-01-06 DIAGNOSIS — F411 Generalized anxiety disorder: Secondary | ICD-10-CM | POA: Diagnosis not present

## 2024-01-06 DIAGNOSIS — F4312 Post-traumatic stress disorder, chronic: Secondary | ICD-10-CM | POA: Diagnosis not present

## 2024-01-06 DIAGNOSIS — F3112 Bipolar disorder, current episode manic without psychotic features, moderate: Secondary | ICD-10-CM | POA: Diagnosis not present

## 2024-01-09 ENCOUNTER — Encounter: Payer: Self-pay | Admitting: *Deleted

## 2024-01-09 ENCOUNTER — Other Ambulatory Visit: Payer: Self-pay | Admitting: *Deleted

## 2024-01-09 ENCOUNTER — Other Ambulatory Visit: Payer: Self-pay

## 2024-01-09 DIAGNOSIS — F4312 Post-traumatic stress disorder, chronic: Secondary | ICD-10-CM | POA: Diagnosis not present

## 2024-01-09 DIAGNOSIS — F411 Generalized anxiety disorder: Secondary | ICD-10-CM | POA: Diagnosis not present

## 2024-01-09 DIAGNOSIS — F250 Schizoaffective disorder, bipolar type: Secondary | ICD-10-CM | POA: Diagnosis not present

## 2024-01-09 NOTE — Patient Outreach (Signed)
 Complex Care Management   Visit Note  01/09/2024  Name:  Anna Flores MRN: [id number] DOB: [birth year]  Situation: Referral received for Complex Care Management related to Mental/Behavioral Health diagnosis Anxiety, Depression, Bipolar Disorder, SDOH Barriers:  Food insecurity Financial Resource Strain, and hypoglycemia. I obtained verbal consent from Patient.  Visit completed with Mont Antis on the phone  Background:   Past Medical History:  Diagnosis Date   Abdominal pain 10/26/2013   Anemia 2013   Anxiety    Back pain affecting pregnancy in third trimester 02/28/2020   Bipolar disorder (HCC)    Depression    History of Papanicolaou smear of cervix 10/02/2016   NEG   IDA (iron  deficiency anemia) 02/29/2020   Indication for care in labor and delivery, antepartum 03/07/2020   Mastitis 01/19/2021   right   Menometrorrhagia 10/26/2013   PONV (postoperative nausea and vomiting)    Postpartum galactocele 02/01/2021   Pregnancy    DELIVERED 08/12/16 - JEG    Assessment: Patient Reported Symptoms:  Cognitive Cognitive Status: Alert and oriented to person, place, and time, Normal speech and language skills Cognitive/Intellectual Conditions Management [RPT]: None reported or documented in medical history or problem list   Health Maintenance Behaviors: Annual physical exam, Stress management (counseling) Healing Pattern: Unsure Health Facilitated by:  (counseling and mediation management)  Neurological Neurological Review of Symptoms: No symptoms reported    HEENT HEENT Symptoms Reported: No symptoms reported      Cardiovascular Cardiovascular Symptoms Reported: No symptoms reported    Respiratory Respiratory Symptoms Reported: No symptoms reported    Endocrine Patient reports the following symptoms related to hypoglycemia or hyperglycemia : Nausea or vomiting, Fast heartbeat, Shakiness Other symptoms related to hypoglycemia or hyperglycemia: lightheaded Is patient diabetic?:  No Endocrine Conditions: Hypoglycemia (nondiabetic) Endocrine Management Strategies: Diet modification, Routine screening Endocrine Self-Management Outcome: 3 (uncertain) Endocrine Comment: encouraged to review educational material on hypoglycemia management. Keep hard candy or something sweet to drink on hand to raise blood sugar if less than 60. Follow-up with PCP  Gastrointestinal Gastrointestinal Symptoms Reported: Change in appetite Additional Gastrointestinal Details: continues to have a decreased appetite since startying Caplyta. Gastrointestinal Management Strategies: Diet modification Gastrointestinal Self-Management Outcome: 4 (good) Gastrointestinal Comment: Discuss change in appetite with psychiatrist. Utilize food pantry list to pickup food. Assisted with signing her children ages 42-10 up for summer lunch program through [employer] online for weekly meal pickup. Will include breakfast and lunch for each child for the week. Email was sent to patient after sign-up complete. Missed Appt. with BSW rescheduled. Nutrition Risk Screen (CP): Reduced oral intake over the last month  Genitourinary Genitourinary Symptoms Reported: No symptoms reported    Integumentary Integumentary Symptoms Reported: No symptoms reported    Musculoskeletal Musculoskelatal Symptoms Reviewed: No symptoms reported   Falls in the past year?: No Number of falls in past year: 1 or less Was there an injury with Fall?: No Fall Risk Category Calculator: 0 Patient Fall Risk Level: Low Fall Risk    Psychosocial Psychosocial Symptoms Reported: Anxiety - if selected complete GAD, Depression - if selected complete PHQ 2-9 Additional Psychological Details: Reports feeling about the same as last week. Has not had any behavioral health emergencies. Denies suicidal thoughts today. Has behavioral health resources and will utilize them if needed. Behavioral Health Conditions: Bipolar affective disorder,  Depression, Anxiety Other Behavorial Health Conditions: schizoaffective disorder, bioplar 1 disorder Behavioral Management Strategies: Medication therapy, Counseling Behavioral Health Self-Management Outcome: 3 (uncertain) Behavioral Health Comment:  Met with psychiatrist this morning. She increased her clonazepam  so that it can be taken during the day as well as at night. Virtual counseling sessions each Tuesday night.          01/01/2024    9:59 AM  Depression screen PHQ 2/9  Decreased Interest 3  Down, Depressed, Hopeless 3  PHQ - 2 Score 6  Altered sleeping 1  Tired, decreased energy 2  Change in appetite 3  Feeling bad or failure about yourself  3  Trouble concentrating 3  Moving slowly or fidgety/restless 3  Suicidal thoughts 2  PHQ-9 Score 23  Difficult doing work/chores Extremely dIfficult    There were no vitals filed for this visit.  Medications Reviewed Today     Reviewed by Ethelene Herald, RN (Registered Nurse) on 01/09/24 at 1338  Med List Status: <None>   Medication Order Taking? Sig Documenting Provider Last Dose Status Informant  Accu-Chek Softclix Lancets lancets [id number] Yes USE AS INSTRUCTED Rockney Cid, DO Taking Active Self  ARIPiprazole  (ABILIFY ) 30 MG tablet [id number] No Take 30 mg by mouth in the morning.  Patient not taking: Reported on 01/01/2024   [provider] Not Taking Active Self  busPIRone  (BUSPAR ) 10 MG tablet [id number] Yes Take 10 mg by mouth 3 (three) times daily. [provider] Taking Active Self  clonazePAM  (KLONOPIN ) 2 MG tablet [id number] Yes Take 2 mg by mouth at bedtime. [provider] Taking Active Self           Med Note Gardenia Jump, [place]   Fri Jan 09, 2024  1:38 PM) Per patient, frequency increased during psychiatry visit today so that she can take during the day as well. Outside Medication record has not updated yet. Will update medication list once I know the dose and directions.   Continuous  Glucose Receiver (DEXCOM G7 RECEIVER) DEVI [id number] No 1 each by Does not apply route daily.  Patient not taking: Reported on 01/09/2024   Rockney Cid, DO Not Taking Active   Continuous Glucose Sensor (DEXCOM G7 SENSOR) MISC [id number] No 1 each by Does not apply route every 14 (fourteen) days.  Patient not taking: Reported on 01/09/2024   Rockney Cid, DO Not Taking Active   cyanocobalamin  (VITAMIN B12) 500 MCG tablet [id number] Yes Take 1 tablet (500 mcg total) by mouth daily. Althia Atlas, MD Taking Active   hydrOXYzine  (ATARAX ) 25 MG tablet [id number] Yes Take 25 mg by mouth every 6 (six) hours as needed for anxiety. [provider] Taking Active Self  lamoTRIgine  (LAMICTAL ) 150 MG tablet [id number] Yes Take 300 mg by mouth at bedtime. [provider] Taking Active Self  Levonorgestrel -Ethinyl Estradiol  (AMETHIA) 0.15-0.03 &0.01 MG tablet [id number] Yes Take 1 tablet by mouth at bedtime. Zenobia Hila, MD Taking Active   lithium  carbonate (LITHOBID ) 300 MG ER tablet [id number] No Take 300 mg by mouth at bedtime.  Patient not taking: Reported on 01/01/2024   [provider] Not Taking Active Self  lumateperone tosylate (CAPLYTA) 42 MG capsule [id number] Yes Take 42 mg by mouth daily. [provider] Taking Active Self  OLANZapine  zydis (ZYPREXA ) 5 MG disintegrating tablet [id number] Yes Take 5 mg by mouth 2 (two) times daily as needed (anxiety/agitation.). [provider] Taking Active Self  QUEtiapine  (SEROQUEL ) 25 MG tablet [id number] Yes Take 25 mg by mouth at bedtime. [provider] Taking Active Self  traZODone  (DESYREL ) 150 MG tablet [id number] No Take 150 mg by mouth at bedtime.  Patient  not taking: Reported on 01/01/2024   [provider] Not Taking Active Self            Recommendation:   PCP Follow-up Specialty provider follow-up psychiatrist and counselor BSW via telephone on 01/19/24 Pickup food at food  pantries and at [employer] food program  Follow Up Plan:   Telephone follow up appointment date/time:  02/03/24 at 9:15  Michele Ahle, RN, BSN [place]  [facility] RN Care Manager Direct Dial: [phone]  Fax: [phone]

## 2024-01-09 NOTE — Patient Instructions (Signed)
 Visit Information  Thank you for taking time to visit with me today. Please don't hesitate to contact me if I can be of assistance to you before our next scheduled appointment.  Your next care management appointment is by telephone on 02/03/24 at 9:15  Please call the care guide team at [phone] if you need to cancel, schedule, or reschedule an appointment.   Please call the [place]  National Suicide Prevention Lifeline: (662) 047-3731 or TTY: (205)223-7408 TTY [phone]) to talk to a trained counselor call 911 if you are experiencing a Mental Health or Behavioral Health Crisis or need someone to talk to.  Michele Ahle, RN, BSN [place]  [facility] RN Care Manager Direct Dial: [phone]  Fax: [phone]

## 2024-01-20 ENCOUNTER — Other Ambulatory Visit: Payer: Self-pay

## 2024-01-20 ENCOUNTER — Emergency Department

## 2024-01-20 ENCOUNTER — Emergency Department
Admission: EM | Admit: 2024-01-20 | Discharge: 2024-01-20 | Disposition: A | Discharge status: Home / Self Care | Attending: Emergency Medicine | Admitting: Emergency Medicine

## 2024-01-20 DIAGNOSIS — R519 Headache, unspecified: Secondary | ICD-10-CM | POA: Insufficient documentation

## 2024-01-20 DIAGNOSIS — R197 Diarrhea, unspecified: Secondary | ICD-10-CM | POA: Diagnosis not present

## 2024-01-20 DIAGNOSIS — R109 Unspecified abdominal pain: Secondary | ICD-10-CM

## 2024-01-20 DIAGNOSIS — R7401 Elevation of levels of liver transaminase levels: Secondary | ICD-10-CM | POA: Diagnosis not present

## 2024-01-20 DIAGNOSIS — R1011 Right upper quadrant pain: Secondary | ICD-10-CM | POA: Diagnosis not present

## 2024-01-20 DIAGNOSIS — R1031 Right lower quadrant pain: Secondary | ICD-10-CM | POA: Diagnosis not present

## 2024-01-20 DIAGNOSIS — R162 Hepatomegaly with splenomegaly, not elsewhere classified: Secondary | ICD-10-CM | POA: Diagnosis not present

## 2024-01-20 DIAGNOSIS — Z90710 Acquired absence of both cervix and uterus: Secondary | ICD-10-CM | POA: Diagnosis not present

## 2024-01-20 DIAGNOSIS — R112 Nausea with vomiting, unspecified: Secondary | ICD-10-CM | POA: Insufficient documentation

## 2024-01-20 LAB — CBC
HCT: 41.3 % (ref 36.0–46.0)
Hemoglobin: 13.8 g/dL (ref 12.0–15.0)
MCH: 28.6 pg (ref 26.0–34.0)
MCHC: 33.4 g/dL (ref 30.0–36.0)
MCV: 85.5 fL (ref 80.0–100.0)
Platelets: 287 10*3/uL (ref 150–400)
RBC: 4.83 MIL/uL (ref 3.87–5.11)
RDW: 12.8 % (ref 11.5–15.5)
WBC: 6.3 10*3/uL (ref 4.0–10.5)
nRBC: 0 % (ref 0.0–0.2)

## 2024-01-20 LAB — URINALYSIS, ROUTINE W REFLEX MICROSCOPIC
Bilirubin Urine: NEGATIVE
Glucose, UA: NEGATIVE mg/dL
Hgb urine dipstick: NEGATIVE
Ketones, ur: 5 mg/dL — AB
Leukocytes,Ua: NEGATIVE
Nitrite: NEGATIVE
Protein, ur: NEGATIVE mg/dL
Specific Gravity, Urine: 1.009 (ref 1.005–1.030)
pH: 7 (ref 5.0–8.0)

## 2024-01-20 LAB — POC URINE PREG, ED: Preg Test, Ur: NEGATIVE

## 2024-01-20 LAB — COMPREHENSIVE METABOLIC PANEL WITH GFR
ALT: 54 U/L — ABNORMAL HIGH (ref 0–44)
AST: 28 U/L (ref 15–41)
Albumin: 4.5 g/dL (ref 3.5–5.0)
Alkaline Phosphatase: 93 U/L (ref 38–126)
Anion gap: 12 (ref 5–15)
BUN: 5 mg/dL — ABNORMAL LOW (ref 6–20)
CO2: 23 mmol/L (ref 22–32)
Calcium: 9.9 mg/dL (ref 8.9–10.3)
Chloride: 103 mmol/L (ref 98–111)
Creatinine, Ser: 0.85 mg/dL (ref 0.44–1.00)
GFR, Estimated: >60 mL/min (ref >60)
Glucose, Bld: 114 mg/dL — ABNORMAL HIGH (ref 70–99)
Potassium: 3.8 mmol/L (ref 3.5–5.1)
Sodium: 138 mmol/L (ref 135–145)
Total Bilirubin: 1.5 mg/dL — ABNORMAL HIGH (ref 0.0–1.2)
Total Protein: 7.9 g/dL (ref 6.5–8.1)

## 2024-01-20 LAB — LIPASE, BLOOD: Lipase: 34 U/L (ref 11–51)

## 2024-01-20 MED ORDER — MORPHINE SULFATE (PF) 4 MG/ML IV SOLN
4.0000 mg | Freq: Once | INTRAVENOUS | Status: AC
  Administered 2024-01-20: 4 mg via INTRAVENOUS
  Filled 2024-01-20: qty 1

## 2024-01-20 MED ORDER — KETOROLAC TROMETHAMINE 15 MG/ML IJ SOLN
15.0000 mg | Freq: Once | INTRAMUSCULAR | Status: AC
  Administered 2024-01-20: 15 mg via INTRAVENOUS
  Filled 2024-01-20: qty 1

## 2024-01-20 MED ORDER — ONDANSETRON 4 MG PO TBDP: 4.0000 mg | ORAL_TABLET | Freq: Three times a day (TID) | ORAL | 0 refills | Status: DC | PRN

## 2024-01-20 MED ORDER — SODIUM CHLORIDE 0.9 % IV BOLUS
1000.0000 mL | Freq: Once | INTRAVENOUS | Status: AC
  Administered 2024-01-20: 1000 mL via INTRAVENOUS

## 2024-01-20 MED ORDER — ONDANSETRON HCL 4 MG/2ML IJ SOLN
4.0000 mg | Freq: Once | INTRAMUSCULAR | Status: AC
  Administered 2024-01-20: 4 mg via INTRAVENOUS
  Filled 2024-01-20: qty 2

## 2024-01-20 MED ORDER — DIPHENHYDRAMINE HCL 50 MG/ML IJ SOLN
12.5000 mg | Freq: Once | INTRAMUSCULAR | Status: AC
  Administered 2024-01-20: 12.5 mg via INTRAVENOUS
  Filled 2024-01-20: qty 1

## 2024-01-20 MED ORDER — IOHEXOL 300 MG/ML  SOLN
100.0000 mL | Freq: Once | INTRAMUSCULAR | Status: AC | PRN
Start: 2024-01-20 — End: 2024-01-20
  Administered 2024-01-20: 100 mL via INTRAVENOUS

## 2024-01-20 NOTE — ED Provider Notes (Signed)
 [facility] Provider Note    Event Date/Time   First MD Initiated Contact with Patient 01/20/24 1003     (approximate)   History   Abdominal Pain   HPI  Anna Flores is a 33 y.o. female with PMH of bipolar disorder, anxiety, IDA who presents for evaluation of nausea vomiting, headache and abdominal pain.  Patient states that her symptoms began with a headache last night on the right side of her head.  Then it progressed to nausea, vomiting and diarrhea.  She developed pain on her right side with some radiation to her back.  Patient had pyelonephritis in April and was concerned that she had it again.  She denies urinary symptoms but states she has some burning at the very end of her urine stream.  No fevers.      Physical Exam   Triage Vital Signs: ED Triage Vitals  Encounter Vitals Group     BP 01/20/24 0941 129/88     Girls Systolic BP Percentile --      Girls Diastolic BP Percentile --      Boys Systolic BP Percentile --      Boys Diastolic BP Percentile --      Pulse Rate 01/20/24 0941 88     Resp 01/20/24 0941 19     Temp 01/20/24 0941 97.9 F (36.6 C)     Temp Source 01/20/24 0941 Oral     SpO2 01/20/24 0941 100 %     Weight 01/20/24 0944 211 lb (95.7 kg)     Height 01/20/24 0944 5' 4 (1.626 m)     Head Circumference --      Peak Flow --      Pain Score 01/20/24 0944 7     Pain Loc --      Pain Education --      Exclude from Growth Chart --     Most recent vital signs: Vitals:   01/20/24 0941  BP: 129/88  Pulse: 88  Resp: 19  Temp: 97.9 F (36.6 C)  SpO2: 100%   General: Awake, no distress.  CV:  Good peripheral perfusion.  RRR. Resp:  Normal effort.  CTAB. Abd:  No distention.  Soft, tender to palpation throughout the right abdomen and upper and lower quadrants with right-sided CVA tenderness Other:  PERRL, EOM intact, no focal neurodeficits.   ED Results / Procedures / Treatments   Labs (all labs ordered are listed, but  only abnormal results are displayed) Labs Reviewed  COMPREHENSIVE METABOLIC PANEL WITH GFR - Abnormal; Notable for the following components:      Result Value   Glucose, Bld 114 (*)    BUN 5 (*)    ALT 54 (*)    Total Bilirubin 1.5 (*)    All other components within normal limits  URINALYSIS, ROUTINE W REFLEX MICROSCOPIC - Abnormal; Notable for the following components:   Color, Urine YELLOW (*)    APPearance CLEAR (*)    Ketones, ur 5 (*)    All other components within normal limits  LIPASE, BLOOD  CBC  POC URINE PREG, ED    RADIOLOGY  CT abdomen pelvis obtained, I interpreted the images as well as reviewed the radiologist report which was negative for acute abnormalities but did show hepatomegaly and splenomegaly.   PROCEDURES:  Critical Care performed: No  Procedures   MEDICATIONS ORDERED IN ED: Medications  sodium chloride  0.9 % bolus 1,000 mL (0 mLs Intravenous Stopped 01/20/24 1338)  ketorolac  (TORADOL ) 15 MG/ML injection 15 mg (15 mg Intravenous Given 01/20/24 1058)  diphenhydrAMINE  (BENADRYL ) injection 12.5 mg (12.5 mg Intravenous Given 01/20/24 1058)  ondansetron  (ZOFRAN ) injection 4 mg (4 mg Intravenous Given 01/20/24 1059)  iohexol  (OMNIPAQUE ) 300 MG/ML solution 100 mL (100 mLs Intravenous Contrast Given 01/20/24 1131)  morphine  (PF) 4 MG/ML injection 4 mg (4 mg Intravenous Given 01/20/24 1340)     IMPRESSION / MDM / ASSESSMENT AND PLAN / ED COURSE  I reviewed the triage vital signs and the nursing notes.                             33 year old female presents for evaluation of headache, nausea, vomiting, diarrhea and abdominal pain.  Vital signs are stable patient NAD on exam.  Differential diagnosis includes, but is not limited to, pyelonephritis, nephrolithiasis, cholecystitis, appendicitis, pancreatitis, migraine, viral gastritis.  Patient's presentation is most consistent with acute complicated illness / injury requiring diagnostic workup.  CBC is  unremarkable.  CMP notable for mild increase in ALT and total bilirubin.  Lipase within normal limits.  Urinalysis shows presence of ketones but no signs of infection.  Suspect that the elevated bilirubin may be due to vomiting but given that she is quite tender on exam we will proceed with a CT scan.  Patient is in agreement with this.  1:29 PM: patient reassessed, reported an improvement in her nausea but still having headache pain, will give patient IV  morphine   CT Abdomen pelvis was negative acute localizing findings but did note hepatomegaly and splenomegaly as incidental findings.  Blood work and imaging are both reassuring and patient has had an improvement in her pain.  Suspect that she may have a viral illness or migraine and abdominal pain was secondary to vomiting.  Will send her with a prescription for some nausea medication.  Recommended that she follow-up with primary care regarding the splenomegaly, hepatomegaly and have repeat blood work for the elevated ALT and total bilirubin.  Patient was agreeable to plan, voiced understanding and was stable at discharge.      FINAL CLINICAL IMPRESSION(S) / ED DIAGNOSES   Final diagnoses:  Abdominal pain, unspecified abdominal location  Acute nonintractable headache, unspecified headache type     Rx / DC Orders   ED Discharge Orders          Ordered    ondansetron  (ZOFRAN -ODT) 4 MG disintegrating tablet  Every 8 hours PRN        01/20/24 1403             Note:  This document was prepared using Dragon voice recognition software and may include unintentional dictation errors.   Phyliss Breen, PA-C 01/20/24 1405    Kandee Orion, MD 01/21/24 1530

## 2024-01-20 NOTE — Discharge Instructions (Addendum)
 Your blood work showed mildly elevated liver enzymes and bilirubin which may be the result of vomiting and a viral illness.  The blood work was otherwise normal aside from this.  The urinalysis did not show any signs of infection.  Your CT scan did not show any acute abnormalities but did show that your liver and spleen are enlarged.  Given that your lab work is normal this is not something to be concerned about.  Please follow-up with your primary care provider who can reassess this.  You can take tylenol  and motrin  as needed for headaches. I have sent some nausea medication to the pharmacy called Zofran . This can be take every 8 hours as needed.

## 2024-01-20 NOTE — ED Triage Notes (Signed)
 Pt comes with N/V, headache and belly pain that started last night. Pt states right lower pain. Pt states hx of kidney infection  and this is similar to that pain.

## 2024-01-22 ENCOUNTER — Telehealth: Payer: Self-pay

## 2024-01-23 ENCOUNTER — Other Ambulatory Visit (HOSPITAL_COMMUNITY): Payer: Self-pay

## 2024-01-29 ENCOUNTER — Encounter: Payer: Self-pay | Admitting: Oncology

## 2024-02-02 ENCOUNTER — Telehealth: Payer: Self-pay

## 2024-02-02 NOTE — Progress Notes (Signed)
   Telephone encounter was:  Unsuccessful.  02/02/2024 Name: Kaydince Towles MRN: [id number] DOB: 06/12/1991  Unsuccessful outbound call made today to assist with:  Food Insecurity  Outreach Attempt:  1st Attempt  A HIPAA compliant voice message was left requesting a return call.  Instructed patient to call back     Jon Colt [facility] Guide, Phone: [phone] Fax: [phone] Website: [email or web address]

## 2024-02-03 ENCOUNTER — Telehealth: Payer: Self-pay

## 2024-02-03 ENCOUNTER — Telehealth: Payer: Self-pay | Admitting: *Deleted

## 2024-02-03 NOTE — Progress Notes (Signed)
   Telephone encounter was:  Unsuccessful.  02/03/2024 Name: Anna Flores MRN: [id number] DOB: [birth year]  Unsuccessful outbound call made today to assist with:  Food Insecurity  Outreach Attempt:  2nd Attempt  No answer and unable to leave a message    Jon Colt [facility]  [facility] Guide, Phone: [phone] Fax: [phone] Website: [email or web address]

## 2024-02-05 ENCOUNTER — Telehealth: Payer: Self-pay

## 2024-02-05 NOTE — Progress Notes (Signed)
   Telephone encounter was:  Unsuccessful.  02/05/2024 Name: Anna Flores MRN: [id number] DOB: February 01, 1991  Unsuccessful outbound call made today to assist with:  Food Insecurity  Outreach Attempt:  3rd Attempt.  Referral closed unable to contact patient.  A HIPAA compliant voice message was left requesting a return call.  Instructed patient to call back    Jon Colt [facility] Guide, Phone: [phone] Fax: [phone] Website: [email or web address]

## 2024-02-16 ENCOUNTER — Other Ambulatory Visit (HOSPITAL_COMMUNITY): Payer: Self-pay

## 2024-02-16 ENCOUNTER — Encounter: Payer: Self-pay | Admitting: Oncology

## 2024-03-02 ENCOUNTER — Ambulatory Visit: Admitting: Obstetrics and Gynecology

## 2024-03-02 DIAGNOSIS — Z79890 Hormone replacement therapy (postmenopausal): Secondary | ICD-10-CM

## 2024-03-02 DIAGNOSIS — Z9889 Other specified postprocedural states: Secondary | ICD-10-CM

## 2024-03-02 NOTE — Progress Notes (Signed)
 [facility] guide has made 3 unsuccessful outreaches. No further outreach attempts will be made at this time. We have been unable to contact the patient to reschedule for complex care management services. 6/30, 7/1, & 7/3  Harlene Satterfield  [facility], [facility] Guide  Direct Dial: [phone]  Fax [phone]

## 2024-04-11 ENCOUNTER — Encounter: Payer: Self-pay | Admitting: Oncology

## 2024-04-11 ENCOUNTER — Other Ambulatory Visit: Payer: Self-pay

## 2024-04-11 ENCOUNTER — Encounter: Payer: Self-pay | Admitting: *Deleted

## 2024-04-11 ENCOUNTER — Emergency Department
Admission: EM | Admit: 2024-04-11 | Discharge: 2024-04-11 | Disposition: A | Discharge status: Home / Self Care | Payer: Self-pay | Attending: Emergency Medicine | Admitting: Emergency Medicine

## 2024-04-11 ENCOUNTER — Emergency Department: Payer: Self-pay

## 2024-04-11 DIAGNOSIS — S83421A Sprain of lateral collateral ligament of right knee, initial encounter: Secondary | ICD-10-CM | POA: Insufficient documentation

## 2024-04-11 DIAGNOSIS — W1839XA Other fall on same level, initial encounter: Secondary | ICD-10-CM | POA: Insufficient documentation

## 2024-04-11 DIAGNOSIS — S8001XA Contusion of right knee, initial encounter: Secondary | ICD-10-CM

## 2024-04-11 NOTE — ED Notes (Signed)
 Patient transported to X-ray

## 2024-04-11 NOTE — ED Triage Notes (Signed)
 Pt states that she was running with socks on in her hallway and fell yesterday. She c/o pain in the right knee. Taking ibuprofen /tylenol  without relief.

## 2024-04-11 NOTE — ED Provider Notes (Signed)
 [place] EMERGENCY DEPARTMENT AT [facility] Provider Note   CSN: [id number] Arrival date & time: 04/11/24  9344     Patient presents with: Knee Pain   Anna Flores is a 33 y.o. female.  Presents to the emergency department valuation of right knee pain.  Yesterday afternoon she was walking down the hall, quickly turned around and face the opposite direction and went down onto her left knee.  She describes blunt force trauma to the patella.  She has been able to walk but with a limp.  She has taken some over-the-counter medication with mild relief.  She denies any other trauma or injury to her body.  Pain is focal to the anterior knee along the patella and tibial tubercle.  She is able to perform a straight leg raise.  Knee feels a little unstable when she walks.      Prior to Admission medications   Medication Sig Start Date End Date Taking? Authorizing Provider  Accu-Chek Softclix Lancets lancets USE AS INSTRUCTED 12/06/22   Bernardo Fend, DO  ARIPiprazole  (ABILIFY ) 30 MG tablet Take 30 mg by mouth in the morning. Patient not taking: Reported on 01/01/2024 04/28/23   [provider]  busPIRone  (BUSPAR ) 10 MG tablet Take 10 mg by mouth 3 (three) times daily. 03/18/22   [provider]  clonazePAM  (KLONOPIN ) 2 MG tablet Take 2 mg by mouth at bedtime. 04/11/23   [provider]  Continuous Glucose Receiver (DEXCOM G7 RECEIVER) DEVI 1 each by Does not apply route daily. Patient not taking: Reported on 01/09/2024 12/08/23   Bernardo Fend, DO  Continuous Glucose Sensor (DEXCOM G7 SENSOR) MISC 1 each by Does not apply route every 14 (fourteen) days. Patient not taking: Reported on 01/09/2024 12/08/23   Bernardo Fend, DO  hydrOXYzine  (ATARAX ) 25 MG tablet Take 25 mg by mouth every 6 (six) hours as needed for anxiety. 09/23/23   [provider]  lamoTRIgine  (LAMICTAL ) 150 MG tablet Take 300 mg by mouth at bedtime. 09/04/23   [provider]   Levonorgestrel -Ethinyl Estradiol  (AMETHIA) 0.15-0.03 &0.01 MG tablet Take 1 tablet by mouth at bedtime. 12/02/23   Janit Alm Agent, MD  lithium  carbonate (LITHOBID ) 300 MG ER tablet Take 300 mg by mouth at bedtime. Patient not taking: Reported on 01/01/2024 09/23/23   [provider]  lumateperone tosylate (CAPLYTA) 42 MG capsule Take 42 mg by mouth daily.    [provider]  OLANZapine  zydis (ZYPREXA ) 5 MG disintegrating tablet Take 5 mg by mouth 2 (two) times daily as needed (anxiety/agitation.). 04/28/23   [provider]  ondansetron  (ZOFRAN -ODT) 4 MG disintegrating tablet Take 1 tablet (4 mg total) by mouth every 8 (eight) hours as needed for nausea or vomiting. 01/20/24   Cleaster Tinnie LABOR, PA-C  QUEtiapine  (SEROQUEL ) 25 MG tablet Take 25 mg by mouth at bedtime. 09/23/23   [provider]  traZODone  (DESYREL ) 150 MG tablet Take 150 mg by mouth at bedtime. Patient not taking: Reported on 01/01/2024 11/07/21   [provider]    Allergies: Amoxicillin, Hydrocodone , Vancomycin , Reglan  [metoclopramide ], and Cherry    Review of Systems  Updated Vital Signs BP (!) 145/81   Pulse 71   Temp 98 F (36.7 C) (Oral)   Resp 16   LMP 10/02/2023 (Approximate)   SpO2 100%   Physical Exam Constitutional:      Appearance: She is well-developed.  HENT:     Head: Normocephalic and atraumatic.  Eyes:  Conjunctiva/sclera: Conjunctivae normal.  Cardiovascular:     Rate and Rhythm: Normal rate.  Pulmonary:     Effort: Pulmonary effort is normal. No respiratory distress.  Musculoskeletal:        General: Normal range of motion.     Cervical back: Normal range of motion.     Comments: Right lower extremity shows full range of motion of the hip with no discomfort.  She is able to perform straight leg raise with mild pain.  No palpable defect in patellar tendon or quad tendon.  No defect in the patella.  Patella is tracking well.  She has pain with stress  testing of the LCL but no laxity.  No pain or laxity with MCL or ACL stress testing.  PCL stable with posterior drawer.  Negative McMurray's test.  No effusion noted.  Minimal bruising anterior knee.  Range of motion 0 to 100 degrees with pain.  She has no swelling or edema down the lower leg or calf tenderness.  Neurovascular intact in right lower extremity.  Skin:    General: Skin is warm.     Findings: No rash.  Neurological:     Mental Status: She is alert and oriented to person, place, and time.  Psychiatric:        Behavior: Behavior normal.        Thought Content: Thought content normal.     (all labs ordered are listed, but only abnormal results are displayed) Labs Reviewed - No data to display  EKG: None  Radiology: DG Knee Complete 4 Views Right Result Date: 04/11/2024 EXAM: 4 VIEW(S) XRAY OF THE RIGHT KNEE 04/11/2024 07:21:00 AM COMPARISON: 10/02/2015 CLINICAL HISTORY: Right knee pain. Running with socks on in her hallway and fell yesterday. States pain in the right knee with movement and at rest. States pain is below patella. States when she lifts her toes, pain shoots up her leg to her knee. FINDINGS: BONES AND JOINTS: No acute fracture. No focal osseous lesion. No joint dislocation. No significant joint effusion. No significant degenerative changes. SOFT TISSUES: The soft tissues are unremarkable. IMPRESSION: 1. No acute fracture or dislocation. 2. No significant soft tissue abnormality. Electronically signed by: Evalene Coho MD 04/11/2024 07:31 AM EDT RP Workstation: [id number]     Procedures   Medications Ordered in the ED - No data to display                                  Medical Decision Making Amount and/or Complexity of Data Reviewed Radiology: ordered.  33 year old female with focal right anterior knee pain consistent with contusion and mild sprain.  She has no effusion or ligamentous laxity on exam.  X-rays ordered and independently reviewed by me  today show no evidence of acute bony abnormality.  No signs of internal derangement on exam.  Patient placed into a knee immobilizer to help with ambulation.  She is also given crutches to help with ambulation.  She is educated on over-the-counter medications to take for pain/swelling as well as rest ice and elevation.  She will follow-up with orthopedics in 5 to 7 days if no improvement.  Final diagnoses:  Contusion of right knee, initial encounter  Sprain of lateral collateral ligament of right knee, initial encounter    ED Discharge Orders     None          Charlene Debby BROCKS, [place] 04/11/24 9248  Arlander Charleston, MD 04/11/24 [phone]

## 2024-04-11 NOTE — Discharge Instructions (Addendum)
 Please use knee immobilizer as needed for comfort.  Rest ice and elevate the knee is much as possible over the next few days.  Alternate Tylenol  and ibuprofen  as needed for pain.  Use crutches as needed for ambulation until you can walk without a limp.  If no improvement with knee pain over the next 5 to 7 days, follow-up with orthopedics.  Return to the ER for any worsening symptoms or any urgent changes in your health

## 2024-04-11 NOTE — ED Notes (Signed)
 PA, Chris at bedside.

## 2024-05-17 ENCOUNTER — Ambulatory Visit: Admitting: Internal Medicine

## 2024-06-09 ENCOUNTER — Other Ambulatory Visit: Payer: Self-pay

## 2024-06-09 ENCOUNTER — Other Ambulatory Visit: Payer: Self-pay | Admitting: Obstetrics and Gynecology

## 2024-06-09 DIAGNOSIS — Z79890 Hormone replacement therapy (postmenopausal): Secondary | ICD-10-CM

## 2024-06-09 MED ORDER — LEVONORGEST-ETH ESTRAD 91-DAY 0.15-0.03 &0.01 MG PO TABS: 1.0000 | ORAL_TABLET | Freq: Every day | ORAL | 4 refills | Status: AC

## 2024-06-28 ENCOUNTER — Other Ambulatory Visit: Payer: Self-pay

## 2024-06-28 ENCOUNTER — Other Ambulatory Visit: Payer: Self-pay | Admitting: Internal Medicine

## 2024-06-28 ENCOUNTER — Emergency Department: Payer: Self-pay

## 2024-06-28 ENCOUNTER — Emergency Department
Admission: EM | Admit: 2024-06-28 | Discharge: 2024-06-28 | Disposition: A | Discharge status: Home / Self Care | Payer: Self-pay | Attending: Emergency Medicine | Admitting: Emergency Medicine

## 2024-06-28 DIAGNOSIS — W19XXXA Unspecified fall, initial encounter: Secondary | ICD-10-CM

## 2024-06-28 DIAGNOSIS — W01198A Fall on same level from slipping, tripping and stumbling with subsequent striking against other object, initial encounter: Secondary | ICD-10-CM | POA: Insufficient documentation

## 2024-06-28 DIAGNOSIS — S20211A Contusion of right front wall of thorax, initial encounter: Secondary | ICD-10-CM | POA: Insufficient documentation

## 2024-06-28 DIAGNOSIS — S298XXA Other specified injuries of thorax, initial encounter: Secondary | ICD-10-CM

## 2024-06-28 MED ORDER — IBUPROFEN 600 MG PO TABS
600.0000 mg | ORAL_TABLET | Freq: Once | ORAL | Status: AC
  Administered 2024-06-28: 600 mg via ORAL
  Filled 2024-06-28: qty 1

## 2024-06-28 MED ORDER — ACETAMINOPHEN 325 MG PO TABS
650.0000 mg | ORAL_TABLET | Freq: Once | ORAL | Status: AC
Start: 2024-06-28 — End: 2024-06-28
  Administered 2024-06-28: 650 mg via ORAL
  Filled 2024-06-28: qty 2

## 2024-06-28 MED ORDER — LIDOCAINE 5 % EX PTCH
1.0000 | MEDICATED_PATCH | CUTANEOUS | Status: DC
  Administered 2024-06-28: 1 via TRANSDERMAL
  Filled 2024-06-28: qty 1

## 2024-06-28 MED ORDER — LIDOCAINE 5 % EX PTCH: 1.0000 | MEDICATED_PATCH | Freq: Two times a day (BID) | CUTANEOUS | 0 refills | Status: AC

## 2024-06-28 NOTE — Discharge Instructions (Signed)
 Your x-ray was normal.  You may take Tylenol /ibuprofen  per package instructions as well as use the patches to help with your pain.  Please remember to take deep breaths.  Please return for any new, worsening, or changing symptoms or other concerns.  It was a pleasure caring for you today.
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
## 2024-06-28 NOTE — ED Triage Notes (Signed)
 Pt to ED for fall this am, tripped on a rock and landed on a rock. Pain to right rib cage, right knee. No bruising noted.

## 2024-06-29 ENCOUNTER — Encounter: Payer: Self-pay | Admitting: Internal Medicine

## 2024-06-29 NOTE — Telephone Encounter (Signed)
 Requested medication (s) are due for refill today: no   Requested medication (s) are on the active medication list: no   Last refill:  na   Future visit scheduled: na   Notes to clinic:  not delegated per protocol. Medication discontinued 05/26/23.      Requested Prescriptions  Pending Prescriptions Disp Refills   tiZANidine  (ZANAFLEX ) 4 MG tablet [Pharmacy Med Name: TIZANIDINE  HCL 4 MG TABLET] 30 tablet 1    Sig: TAKE 1 TABLET BY MOUTH EVERY 6 HOURS AS NEEDED FOR MUSCLE SPASMS.     Not Delegated - Cardiovascular:  Alpha-2 Agonists - tizanidine  Failed - 06/29/2024  3:27 PM      Failed - This refill cannot be delegated      Failed - Valid encounter within last 6 months    Recent Outpatient Visits           7 months ago Hospital discharge follow-up   [facility] Bernardo Fend, [place]

## 2024-07-09 ENCOUNTER — Encounter: Payer: Self-pay | Admitting: Internal Medicine

## 2024-07-21 ENCOUNTER — Telehealth: Payer: Self-pay | Admitting: *Deleted

## 2024-07-21 ENCOUNTER — Encounter: Payer: Self-pay | Admitting: *Deleted

## 2024-07-21 NOTE — Patient Instructions (Signed)
 Anna Flores - I am sorry I was unable to reach you today. I work with Bernardo Fend, DO and am calling to support your healthcare needs. We last spoke in June. I have scheduled a call for 07/26/24 at 9:30 but you can reach out to me at [phone] if that time doesn't work well for you. I look forward to speaking with you soon.   Thank you,   Josette Pellet, RN, BSN [place]  [facility] RN Care Manager Direct Dial: [phone]  Fax: [phone]

## 2024-07-26 ENCOUNTER — Telehealth: Payer: Self-pay | Admitting: *Deleted

## 2024-08-10 ENCOUNTER — Encounter: Payer: Self-pay | Admitting: Oncology

## 2024-08-10 ENCOUNTER — Ambulatory Visit: Payer: Self-pay

## 2024-08-10 ENCOUNTER — Emergency Department: Payer: MEDICAID

## 2024-08-10 ENCOUNTER — Emergency Department
Admission: EM | Admit: 2024-08-10 | Discharge: 2024-08-10 | Disposition: A | Discharge status: Home / Self Care | Payer: MEDICAID | Attending: Emergency Medicine | Admitting: Emergency Medicine

## 2024-08-10 ENCOUNTER — Other Ambulatory Visit: Payer: Self-pay

## 2024-08-10 DIAGNOSIS — R519 Headache, unspecified: Secondary | ICD-10-CM | POA: Diagnosis present

## 2024-08-10 DIAGNOSIS — J101 Influenza due to other identified influenza virus with other respiratory manifestations: Secondary | ICD-10-CM | POA: Diagnosis not present

## 2024-08-10 MED ORDER — ONDANSETRON 4 MG PO TBDP: 4.0000 mg | ORAL_TABLET | Freq: Three times a day (TID) | ORAL | 0 refills | Status: AC | PRN

## 2024-08-10 NOTE — Telephone Encounter (Signed)
 Lvm for pt to call back and schedule an appt.

## 2024-08-10 NOTE — ED Provider Notes (Signed)
" ° °  [facility] Provider Note    Event Date/Time   First MD Initiated Contact with Patient 08/10/24 [phone]     (approximate)   History   Emesis   HPI  Anna Flores is a 34 y.o. female who presents with complaints of nausea, chills, body aches, headache over the last several days.  She reports her family was diagnosed with influenza A last week     Physical Exam   Triage Vital Signs: ED Triage Vitals [08/10/24 0846]  Encounter Vitals Group     BP (!) 144/84     Girls Systolic BP Percentile      Girls Diastolic BP Percentile      Boys Systolic BP Percentile      Boys Diastolic BP Percentile      Pulse Rate 89     Resp 20     Temp 98.9 F (37.2 C)     Temp src      SpO2 98 %     Weight      Height      Head Circumference      Peak Flow      Pain Score 8     Pain Loc      Pain Education      Exclude from Growth Chart     Most recent vital signs: Vitals:   08/10/24 0846  BP: (!) 144/84  Pulse: 89  Resp: 20  Temp: 98.9 F (37.2 C)  SpO2: 98%     General: Awake, no distress.  CV:  Good peripheral perfusion.  Resp:  Normal effort.  Clear to auscultation bilaterally Abd:  No distention.  Other:     ED Results / Procedures / Treatments   Labs (all labs ordered are listed, but only abnormal results are displayed) Labs Reviewed - No data to display   EKG     RADIOLOGY Chest x-ray view interpret by me, no pneumonia    PROCEDURES:  Critical Care performed:   Procedures   MEDICATIONS ORDERED IN ED: Medications - No data to display   IMPRESSION / MDM / ASSESSMENT AND PLAN / ED COURSE  I reviewed the triage vital signs and the nursing notes. Patient's presentation is most consistent with acute complicated illness / injury requiring diagnostic workup.  Patient presents with symptoms as detailed above, highly suspicious for influenza given history and prevalence in the community this time.  Overall well-appearing with  reassuring exam, chest x-ray without evidence of pneumonia.  Recommend supportive care, outpatient follow-up, no indication for admission she agrees with this plan, return precautions discussed.        FINAL CLINICAL IMPRESSION(S) / ED DIAGNOSES   Final diagnoses:  Influenza A     Rx / DC Orders   ED Discharge Orders          Ordered    ondansetron  (ZOFRAN -ODT) 4 MG disintegrating tablet  Every 8 hours PRN        08/10/24 0928             Note:  This document was prepared using Dragon voice recognition software and may include unintentional dictation errors.   Arlander Charleston, MD 08/10/24 1320  "

## 2024-08-10 NOTE — Telephone Encounter (Signed)
 FYI Only or Action Required?: FYI only for provider: UC.  Patient was last seen in primary care on 11/14/2023 by Bernardo Fend, DO.  Called Nurse Triage reporting URI.  Symptoms began several days ago.  Interventions attempted: OTC medications: tylenol , ibuprofen , zofran  (rx).  Symptoms are: gradually worsening.  Triage Disposition: See HCP Within 4 Hours (Or PCP Triage)  Patient/caregiver understands and will follow disposition?:   Reason for Disposition  MILD difficulty breathing (e.g., minimal/no SOB at rest, SOB with walking, pulse < 100)  Answer Assessment - Initial Assessment Questions Pt states her child recently tested positive for flu. Her symptoms began 3 days ago. C/o vomiting, cough, chest congestion/tightness, throat tightness, fever up to 103 F. Has been taking tylenol , ibuprofen , zofran . States no improvement in vomiting with 4 mg zofran .  1. SYMPTOMS: What is your main symptom or concern? (e.g., cough, fever, shortness of breath, muscle aches)     See above 2. ONSET: When did the symptoms start?      3 days 3. COUGH: Do you have a cough? If Yes, ask: How bad is the cough?       Yes 4. FEVER: Do you have a fever? If Yes, ask: What is your temperature, how was it measured, and when did it start?     103 5. BREATHING DIFFICULTY: Are you having any difficulty breathing? (e.g., normal; shortness of breath, wheezing, unable to speak)      Yes, is speaking in clear and full sentences 6. BETTER-SAME-WORSE: Are you getting better, staying the same or getting worse compared to yesterday?  If getting worse, ask, In what way?     Worse 7. OTHER SYMPTOMS: Do you have any other symptoms?  (e.g., chills, fatigue, headache, loss of smell or taste, muscle pain, sore throat)     See above 8. INFLUENZA EXPOSURE: Was there any known exposure to influenza (flu) before the symptoms began?      Yes, son tested positive  Protocols used: Influenza (Flu)  Suspected-A-AH

## 2024-08-10 NOTE — ED Triage Notes (Addendum)
 Pt to ED via POV from home. Pt reports cough, HA, chest congestion, fatigue, N/V, fever (highest 103) and reports feels harder to take a deep breath from rib pain from coughing x3 days.   Pt reports has 5 kids and they recently dx with Flu.

## 2024-08-10 NOTE — Telephone Encounter (Signed)
 Copied from CRM [id number]. Topic: Clinical - Red Word Triage >> Aug 10, 2024  8:05 AM Amy B wrote: Red Word that prompted transfer to Nurse Triage: Chest tightness, pain and pressure radiating to back and neck, headache, fever 103, body aches   Call dropped, called back and left message to call back about symptoms.

## 2024-08-11 ENCOUNTER — Ambulatory Visit: Payer: Self-pay

## 2024-08-11 NOTE — Telephone Encounter (Signed)
" °  FYI Only or Action Required?: FYI only for provider: appointment scheduled on 08/12/24.  Patient was last seen in primary care on 11/14/2023 by Bernardo Fend, DO.  Called Nurse Triage reporting Sinusitis.  Symptoms began today.  Interventions attempted: OTC medications: tylenol .  Symptoms are: gradually worsening.  Triage Disposition: No disposition on file.  Patient/caregiver understands and will follow disposition?:   Copied from CRM [id number]. Topic: Clinical - Red Word Triage >> Aug 11, 2024  4:17 PM Alexandria E wrote: [facility] that prompted transfer to Nurse Triage: Patient was seen in ED yesterday, worsening jaw pain and headaches from sinus pressure. Reason for Disposition  [1] SEVERE sinus pain (e.g., excruciating) AND [2] not improved 2 hours after pain medicine  Answer Assessment - Initial Assessment Questions 1. LOCATION: Where does it hurt?      Forehead, cheek area 2. ONSET: When did the sinus pain start?  (e.g., hours, days)      today 3. SEVERITY: How bad is the pain?   (Scale 0-10; or none, mild, moderate or severe)     severe 4. RECURRENT SYMPTOM: Have you ever had sinus problems before? If Yes, ask: When was the last time? and What happened that time?       5. NASAL CONGESTION: Is the nose blocked? If Yes, ask: Can you open it or must you breathe through your mouth?     yes 6. NASAL DISCHARGE: Do you have discharge from your nose? If so ask, What color?     yellow 7. FEVER: Do you have a fever? If Yes, ask: What is it, how was it measured, and when did it start?      no 8. OTHER SYMPTOMS: Do you have any other symptoms? (e.g., sore throat, cough, earache, difficulty breathing)     Flu pos yesterday  Protocols used: Sinus Pain or Congestion-A-AH  "

## 2024-08-12 ENCOUNTER — Other Ambulatory Visit: Payer: Self-pay

## 2024-08-12 ENCOUNTER — Telehealth (INDEPENDENT_AMBULATORY_CARE_PROVIDER_SITE_OTHER): Payer: MEDICAID | Admitting: Internal Medicine

## 2024-08-12 ENCOUNTER — Encounter: Payer: Self-pay | Admitting: Internal Medicine

## 2024-08-12 DIAGNOSIS — R051 Acute cough: Secondary | ICD-10-CM

## 2024-08-12 DIAGNOSIS — J0110 Acute frontal sinusitis, unspecified: Secondary | ICD-10-CM | POA: Diagnosis not present

## 2024-08-12 MED ORDER — BENZONATATE 100 MG PO CAPS: 100.0000 mg | ORAL_CAPSULE | Freq: Two times a day (BID) | ORAL | 0 refills | Status: DC | PRN

## 2024-08-12 MED ORDER — AZITHROMYCIN 250 MG PO TABS: 500.0000 mg | ORAL_TABLET | Freq: Every day | ORAL | 0 refills | Status: AC

## 2024-08-12 MED ORDER — ALBUTEROL SULFATE HFA 108 (90 BASE) MCG/ACT IN AERS: 2.0000 | INHALATION_SPRAY | Freq: Four times a day (QID) | RESPIRATORY_TRACT | 2 refills | Status: AC | PRN

## 2024-08-12 NOTE — Progress Notes (Signed)
 Virtual Visit via Video Note  I connected with Anna Flores on 08/12/2024 at  2:20 PM EST by a video enabled telemedicine application and verified that I am speaking with the correct person using two identifiers.  Location: Patient: Home Provider: [facility]   I discussed the limitations of evaluation and management by telemedicine and the availability of in person appointments. The patient expressed understanding and agreed to proceed.  History of Present Illness:  Patient presents via telemedicine for ER follow up.   Discussed the use of AI scribe software for clinical note transcription with the patient, who gave verbal consent to proceed.  History of Present Illness Anna Flores is a 34 year old female who presents with flu A and sinus symptoms.  She was diagnosed with flu A two days ago after sudden onset fever to 103F Sunday night. On Tuesday she developed difficulty breathing, chest pressure, and rib pain. By Wednesday she developed severe sinus-type symptoms with constant headache, facial swelling, jaw and teeth pain, photophobia, and burning inside her nose. The headache has not improved with Sudafed, Mucinex, DayQuil, Tylenol , or ibuprofen . She had a chest x-ray in the ER. She has not used Tamiflu or an inhaler in the past. Her daughter recently had flu B and was offered Tamiflu, which she declined due to concern about side effects. She is allergic to amoxicillin and has used a Z-Pak in the past for sinus infections. She noticed brief improvement after rest on Tuesday, followed by worsening sinus symptoms the next day.   Discharge Date: 08/10/24 Diagnosis: Influenza A Procedures/tests: Chest x-ray negative for active cardiopulmonary disease, no viral test ? Consultants: None New medications: Zofran  Discontinued medications: None Status: worse    Observations/Objective:  General: well appearing, no acute distress ENT: conjunctiva normal appearing bilaterally, congested sounding  Skin:  no rashes, cyanosis or abnormal bruising noted Neuro: answers all questions appropriately   Assessment and Plan:  Assessment & Plan Acute frontal sinusitis Symptoms include headache, sinus pressure, and facial pain. Over-the-counter medications ineffective. Viral etiology likely due to recent influenza. Discussed azithromycin  use if symptoms worsen. Nasal sprays and saline recommended for relief. Deferred steroids due to immune suppression risk. - Prescribed azithromycin  (Z-Pak) to start if symptoms worsen or persist. - Recommended nasal saline spray and Flonase . - Advised against immediate use of oral steroids.  Influenza A with respiratory symptoms Diagnosed with Influenza A two days ago at the ER but I do not see a respiratory viral panel. Symptoms include fever, respiratory distress, and chest pressure. Chest x-ray negative for pneumonia. Discussed Tamiflu but opted for symptomatic treatment. Prescribed albuterol  for respiratory symptoms. Emphasized rest, hydration, and immune support. - Prescribed albuterol  inhaler. - Prescribed cough medication. - Advised rest, hydration, and immune support with vitamin C and zinc. - Instructed to monitor symptoms and report if no improvement or worsening.  - azithromycin  (ZITHROMAX ) 250 MG tablet; Take 2 tablets (500 mg total) by mouth daily for 3 days. Take 2 tablets on day 1, then 1 tablet daily on days 2 through 5  Dispense: 6 tablet; Refill: 0 - albuterol  (VENTOLIN  HFA) 108 (90 Base) MCG/ACT inhaler; Inhale 2 puffs into the lungs every 6 (six) hours as needed for wheezing or shortness of breath.  Dispense: 8 g; Refill: 2 - benzonatate  (TESSALON ) 100 MG capsule; Take 1 capsule (100 mg total) by mouth 2 (two) times daily as needed for cough.  Dispense: 20 capsule; Refill: 0    Follow Up Instructions: PRN    I discussed  the assessment and treatment plan with the patient. The patient was provided an opportunity to ask questions and all were  answered. The patient agreed with the plan and demonstrated an understanding of the instructions.   The patient was advised to call back or seek an in-person evaluation if the symptoms worsen or if the condition fails to improve as anticipated.  I provided 11 minutes of non-face-to-face time during this encounter.   Sharyle Fischer, DO

## 2024-08-31 ENCOUNTER — Ambulatory Visit: Payer: Self-pay

## 2024-08-31 NOTE — Telephone Encounter (Signed)
 1st attempt to reach patient, no answer-left voicemail to call back at [phone]  Reason for Triage: Patient has been coughing up discolored phlegm, has headaches. Sore throat and sinus pressure

## 2024-08-31 NOTE — Telephone Encounter (Addendum)
 FYI Only or Action Required?: FYI only for provider: appointment scheduled on 1/28 virtual visit.  Patient was last seen in primary care on 08/12/2024 by Bernardo Fend, DO.  Called Nurse Triage reporting Sinusitis.  Symptoms began several days ago.  Interventions attempted: OTC medications: Tylenol  and Prescription medications: azithromycin .  Symptoms are: gradually worsening.  Triage Disposition: See PCP When Office is Open (Within 3 Days)  Patient/caregiver understands and will follow disposition?: Yes   1/8 positive for the flu and dx with sinus infection after virtual visit with PCP. Took azithromycin . Felt better for a bit but now with headache behind eye socket, sinus pressure, sore throat x2 days. Clear phlegm from nose. No fever. No CP or SOB. Scheduled appt with different provider at home office tomorrow d/t no PCP availability within timeframe. Advised UC or ED for worsening symptoms.     Reason for Triage: Patient has been coughing up discolored phlegm, has headaches. Sore throat and sinus pressure   Reason for Disposition  [1] Sinus congestion (pressure, fullness) AND [2] present > 10 days    Ongoing since 1/8. Went away for a week but then came back.  Answer Assessment - Initial Assessment Questions 1. LOCATION: Where does it hurt?      Sinuses and head  2. ONSET: When did the sinus pain start?  (e.g., hours, days)      2 days ago  3. SEVERITY: How bad is the pain?   (Scale 0-10; or none, mild, moderate or severe)     5-6/10  4. RECURRENT SYMPTOM: Have you ever had sinus problems before? If Yes, ask: When was the last time? and What happened that time?      Yes. Takes abxs.  5. NASAL CONGESTION: Is the nose blocked? If Yes, ask: Can you open it or must you breathe through your mouth?     Yes  6. NASAL DISCHARGE: Do you have discharge from your nose? If so ask, What color?     Clear  7. FEVER: Do you have a fever? If Yes, ask: What  is it, how was it measured, and when did it start?      Denies  8. OTHER SYMPTOMS: Do you have any other symptoms? (e.g., sore throat, cough, earache, difficulty breathing)     Sore throat, headache.  9. PREGNANCY: Is there any chance you are pregnant? When was your last menstrual period?     Denies, has had hysterectomy.  Protocols used: Sinus Pain or Congestion-A-AH

## 2024-09-01 ENCOUNTER — Encounter: Payer: Self-pay | Admitting: Family Medicine

## 2024-09-01 ENCOUNTER — Telehealth: Payer: MEDICAID | Admitting: Family Medicine

## 2024-09-01 ENCOUNTER — Encounter: Payer: Self-pay | Admitting: Oncology

## 2024-09-01 ENCOUNTER — Ambulatory Visit: Payer: Self-pay | Admitting: Family Medicine

## 2024-09-01 ENCOUNTER — Ambulatory Visit
Admission: RE | Admit: 2024-09-01 | Discharge: 2024-09-01 | Disposition: A | Discharge status: Home / Self Care | Payer: MEDICAID | Attending: Family Medicine | Admitting: Family Medicine

## 2024-09-01 ENCOUNTER — Ambulatory Visit
Admission: RE | Admit: 2024-09-01 | Discharge: 2024-09-01 | Disposition: A | Payer: MEDICAID | Source: Ambulatory Visit | Attending: Family Medicine | Admitting: Family Medicine

## 2024-09-01 DIAGNOSIS — R0981 Nasal congestion: Secondary | ICD-10-CM

## 2024-09-01 DIAGNOSIS — R051 Acute cough: Secondary | ICD-10-CM | POA: Diagnosis present

## 2024-09-01 DIAGNOSIS — M791 Myalgia, unspecified site: Secondary | ICD-10-CM

## 2024-09-01 DIAGNOSIS — J029 Acute pharyngitis, unspecified: Secondary | ICD-10-CM

## 2024-09-01 LAB — POC COVID19/FLU A&B COMBO
Covid Antigen, POC: NEGATIVE
Influenza A Antigen, POC: NEGATIVE
Influenza B Antigen, POC: NEGATIVE

## 2024-09-01 LAB — POCT RAPID STREP A (OFFICE): Rapid Strep A Screen: NEGATIVE

## 2024-09-01 MED ORDER — PROMETHAZINE-DM 6.25-15 MG/5ML PO SYRP: 5.0000 mL | ORAL_SOLUTION | Freq: Three times a day (TID) | ORAL | 0 refills | Status: AC | PRN

## 2024-09-01 MED ORDER — BENZONATATE 200 MG PO CAPS: 200.0000 mg | ORAL_CAPSULE | Freq: Two times a day (BID) | ORAL | 0 refills | Status: AC | PRN

## 2024-09-01 NOTE — Progress Notes (Signed)
 "                    MyChart Video Visit    Virtual Visit via Video Note   This format is felt to be most appropriate for this patient at this time. Physical exam was limited by quality of the video and audio technology used for the visit.    Patient location: home Provider location: [facility] Persons involved in the visit: patient, provider  I discussed the limitations of evaluation and management by telemedicine and the availability of in person appointments. The patient expressed understanding and agreed to proceed.  Patient: Anna Flores   DOB: 1991/01/23   33 y.o. Female  MRN: [id number] Visit Date: 09/01/2024  Today's healthcare provider: LAYMON LOISE CORE, FNP   Chief Complaint  Patient presents with   Sinusitis    08/12/24 tested positive for Flu A.   Sore Throat   Cough    Subjective:     HPI  Patient is a 34 year old female who is seen via virtual visit/ telemedicine for complaints of sinus congestion, sore throat and cough. She is a new patient to me.   She reports she was at the ER on 08/11/23 after flu A exposure with complaints of nausea, chills, body aches and headache. Suspected at time of ER visit she also had the flu, however no testing completed that I reviewed during today's chart review. She was then seen on 08/12/24 by her PCP, Dr. Bernardo, for ER follow up. At time of follow up symptoms had progressed and she complained of chest congestion, chest pressure and rib pain as well as headache, facial swelling, jaw and teeth pain. Chest x-ray on 08/10/24 negative for pneumonia. Diagnosed with sinusitis at time of ER follow up on 08/12/24 and treated with Azithromycin . She voices she completed antibiotic regimen. She voices symptoms did resolve after initial treatment earlier this month. She voices she felt good for one week before becoming ill again.   As noted, today she complains of sinus congestion, sore throat, and cough. She voices sore throat  was the first symptom she developed describing burning sensation. Denies pain with swallowing. She reports symptom onset of 3 days ago; she reports symptoms worsened this morning. She voices yesterday she did not feel too bad but today was worse. She voices this morning she did have a fever. Tmax of 101.6 this morning. She voices this morning she had an episode of emesis after coughing episodes. Denies associated nausea. She voices morning symptoms progressed including body aches, headache and fever as noted.  She voices she did not pick up Flonase  that was sent last time. She voices she did take one dose of Sudafed last night and not sure if it helped.  She voices she has been taking OTC Tylenol  and had some relief.    She voices she believes she could get someone to come sit with her children to be able to come into office to get testing done. Will order strep testing, flu and COVID testing as well as a chest x-ray.   Review of Systems  Constitutional:  Positive for fever.  HENT:  Positive for congestion, nosebleeds, sinus pain and sore throat.   Respiratory:  Positive for cough and sputum production. Negative for shortness of breath.   Gastrointestinal:  Positive for vomiting. Negative for nausea.  Neurological:  Positive for headaches.         Objective:    LMP 10/02/2023  Physical Exam Constitutional:      General: She is not in acute distress.    Appearance: She is well-developed. She is not toxic-appearing.  Pulmonary:     Effort: Pulmonary effort is normal.  Neurological:     General: No focal deficit present.     Mental Status: She is alert.  Psychiatric:        Mood and Affect: Mood normal.        Behavior: Behavior normal.     Physical exam limited due to virtual visit.      Assessment & Plan:    Assessment & Plan Acute cough Cough x 3 days. Describes cough as productive with clear sputum. See HPI for details.  -Chest x-ray ordered STAT -Rapid flu and  COVID testing ordered, pt to come in for nurse visit this morning -Promethazine  DM 6.25-15mg / 5mL with instructions to take 5 mL by mouth every 8 hours PRN cough. Advised that medication can cause drowsiness/ sleepiness, so she should refrain from taking medication and driving -Benzonatate  200mg  BID PRN cough  Orders:   promethazine -dextromethorphan (PROMETHAZINE -DM) 6.25-15 MG/5ML syrup; Take 5 mLs by mouth 3 (three) times daily as needed for cough.   benzonatate  (TESSALON ) 200 MG capsule; Take 1 capsule (200 mg total) by mouth 2 (two) times daily as needed for cough.   POC Covid19/Flu A&B Antigen   DG Chest 2 View; Future  Sore throat Sore throat x 3 days. See HPI for detail.  -Alternate Tylenol  and Ibuprofen  as needed -Rapid strep testing ordered, pt to come in for nurse visit this morning  Orders:   POCT rapid strep A   Culture, Group A Strep   POC Covid19/Flu A&B Antigen  Myalgia Complains of body aches x 3 days. See HPI for details.  -Alternate Tylenol  and Ibuprofen  as needed.  -Rapid COVID and flu testing ordered -Rapid strep testing ordered Orders:   POC Covid19/Flu A&B Antigen  Sinus congestion Sinus congestion x 3 days. See HPI for details.  -Rapid COVID and flu testing ordered -Recommended OTC Mucinex as needed for sinus congestion -Recommended OTC nasal saline spray as needed for sinus congestion  Orders:   POC Covid19/Flu A&B Antigen     Return if symptoms worsen or fail to improve.     I discussed the assessment and treatment plan with the patient. The patient was provided an opportunity to ask questions and all were answered. The patient agreed with the plan and demonstrated an understanding of the instructions.   The patient was advised to call back or seek an in-person evaluation if the symptoms worsen or if the condition fails to improve as anticipated.  I provided 10 minutes of non-face-to-face time during this encounter.  LAYMON LOISE CORE, FNP [facility]    "

## 2024-09-03 LAB — CULTURE, GROUP A STREP
Micro Number: 17523470
SPECIMEN QUALITY:: ADEQUATE

## 2024-09-06 ENCOUNTER — Emergency Department: Payer: MEDICAID

## 2024-09-06 ENCOUNTER — Other Ambulatory Visit: Payer: Self-pay

## 2024-09-06 ENCOUNTER — Encounter: Payer: Self-pay | Admitting: *Deleted

## 2024-09-06 ENCOUNTER — Emergency Department
Admission: EM | Admit: 2024-09-06 | Discharge: 2024-09-06 | Disposition: A | Discharge status: Home / Self Care | Payer: MEDICAID

## 2024-09-06 DIAGNOSIS — R079 Chest pain, unspecified: Secondary | ICD-10-CM | POA: Insufficient documentation

## 2024-09-06 DIAGNOSIS — R0602 Shortness of breath: Secondary | ICD-10-CM | POA: Insufficient documentation

## 2024-09-06 DIAGNOSIS — M79662 Pain in left lower leg: Secondary | ICD-10-CM | POA: Insufficient documentation

## 2024-09-06 DIAGNOSIS — M79605 Pain in left leg: Secondary | ICD-10-CM

## 2024-09-06 LAB — BASIC METABOLIC PANEL WITH GFR
Anion gap: 17 — ABNORMAL HIGH (ref 5–15)
BUN: 6 mg/dL (ref 6–20)
CO2: 19 mmol/L — ABNORMAL LOW (ref 22–32)
Calcium: 9.4 mg/dL (ref 8.9–10.3)
Chloride: 102 mmol/L (ref 98–111)
Creatinine, Ser: 0.83 mg/dL (ref 0.44–1.00)
GFR, Estimated: >60 mL/min
Glucose, Bld: 102 mg/dL — ABNORMAL HIGH (ref 70–99)
Potassium: 3.5 mmol/L (ref 3.5–5.1)
Sodium: 138 mmol/L (ref 135–145)

## 2024-09-06 LAB — CBC
HCT: 38.2 % (ref 36.0–46.0)
Hemoglobin: 13.5 g/dL (ref 12.0–15.0)
MCH: 29.7 pg (ref 26.0–34.0)
MCHC: 35.3 g/dL (ref 30.0–36.0)
MCV: 84.1 fL (ref 80.0–100.0)
Platelets: 284 10*3/uL (ref 150–400)
RBC: 4.54 MIL/uL (ref 3.87–5.11)
RDW: 13.2 % (ref 11.5–15.5)
WBC: 8.3 10*3/uL (ref 4.0–10.5)
nRBC: 0 % (ref 0.0–0.2)

## 2024-09-06 LAB — TROPONIN T, HIGH SENSITIVITY
Troponin T High Sensitivity: <6 ng/L (ref 0–19)
Troponin T High Sensitivity: <6 ng/L (ref 0–19)

## 2024-09-06 MED ORDER — OXYCODONE-ACETAMINOPHEN 5-325 MG PO TABS
1.0000 | ORAL_TABLET | Freq: Once | ORAL | Status: AC
  Administered 2024-09-06: 1 via ORAL
  Filled 2024-09-06: qty 1

## 2024-09-06 MED ORDER — MORPHINE SULFATE (PF) 4 MG/ML IV SOLN
4.0000 mg | Freq: Once | INTRAVENOUS | Status: AC
  Administered 2024-09-06: 4 mg via INTRAVENOUS
  Filled 2024-09-06: qty 1

## 2024-09-06 MED ORDER — IOHEXOL 350 MG/ML SOLN
75.0000 mL | Freq: Once | INTRAVENOUS | Status: AC | PRN
  Administered 2024-09-06: 75 mL via INTRAVENOUS

## 2024-09-06 MED ORDER — ONDANSETRON HCL 4 MG/2ML IJ SOLN
4.0000 mg | Freq: Once | INTRAMUSCULAR | Status: AC
  Administered 2024-09-06: 4 mg via INTRAVENOUS
  Filled 2024-09-06: qty 2

## 2024-09-06 MED ORDER — FAMOTIDINE IN NACL 20-0.9 MG/50ML-% IV SOLN
20.0000 mg | Freq: Once | INTRAVENOUS | Status: AC
  Administered 2024-09-06: 20 mg via INTRAVENOUS
  Filled 2024-09-06: qty 50

## 2024-09-06 NOTE — ED Notes (Addendum)
 Pt to ed via ems from home, pt reports being anxious that stemmed from CP and lower left leg after falling in a hole. Hx anxiety

## 2024-09-06 NOTE — ED Triage Notes (Signed)
 Pt brought in via ems from home.  Pt has chest pain.  Sx began today  hx anxiety.  Pt reports nausea.  Pt took zofran  without relief.  No sob.  Pt alert

## 2024-09-08 ENCOUNTER — Telehealth: Payer: MEDICAID

## 2024-09-08 ENCOUNTER — Encounter: Payer: Self-pay | Admitting: Oncology

## 2024-09-08 NOTE — Patient Instructions (Signed)
 Clayborne Loss - I am sorry I was unable to reach you today for our appointment. I work with Bernardo Fend, DO and am calling to support your healthcare needs. Please contact me at [phone] if there is anything I can assist you with.   Thank you,  Warren Quivers RN CM Population Health-Complex Care Management [facility] [phone]

## 2024-09-09 ENCOUNTER — Ambulatory Visit: Payer: MEDICAID | Admitting: Internal Medicine

## 2024-09-09 ENCOUNTER — Other Ambulatory Visit: Payer: Self-pay

## 2024-09-09 ENCOUNTER — Encounter: Payer: Self-pay | Admitting: Internal Medicine

## 2024-09-09 VITALS — BP 118/76 | HR 88 | Temp 98.3°F | Resp 16 | Ht 65.0 in | Wt 219.7 lb

## 2024-09-09 DIAGNOSIS — R739 Hyperglycemia, unspecified: Secondary | ICD-10-CM | POA: Diagnosis not present

## 2024-09-09 DIAGNOSIS — M62831 Muscle spasm of calf: Secondary | ICD-10-CM | POA: Diagnosis not present

## 2024-09-09 DIAGNOSIS — M940 Chondrocostal junction syndrome [Tietze]: Secondary | ICD-10-CM | POA: Diagnosis not present

## 2024-09-09 MED ORDER — TIZANIDINE HCL 4 MG PO TABS: 4.0000 mg | ORAL_TABLET | Freq: Every evening | ORAL | 0 refills | Status: AC | PRN

## 2024-09-09 MED ORDER — ACCU-CHEK SOFTCLIX LANCETS MISC: 12 refills | Status: AC

## 2024-09-09 MED ORDER — NAPROXEN 500 MG PO TABS: 500.0000 mg | ORAL_TABLET | Freq: Two times a day (BID) | ORAL | 0 refills | Status: AC

## 2024-09-09 NOTE — Progress Notes (Signed)
 "  Acute Office Visit  Subjective:     Patient ID: Anna Flores, female    DOB: 1991-05-02, 34 y.o.   MRN: [id number]  Chief Complaint  Patient presents with   Chest Pain    Seen in ER. Pain radiates to left shoulder. Pain started behind left knee and radiate to calf    HPI Patient is in today for ER follow up.   Discharge Date: 09/06/24 Diagnosis: chest pain Procedures/tests: EKG normal sinus rhythm, CBC, BMP and troponin negative. Chest x-ray without abnormalities, CTA negative for PE, US  negative for LLE DVT Consultants: None New medications: None Status: fluctuating  Discussed the use of AI scribe software for clinical note transcription with the patient, who gave verbal consent to proceed.  History of Present Illness Anna Flores is a 34 year old female who presents with leg and chest pain following a fall.  She developed leg pain on Saturday after falling in the snow. There was no immediate pain or twisting, but later she noted constant tight pain behind the kneecap radiating down the calf, described like a Charlie horse. Pain worsens with leg extension or when the leg is laid sideways. Tylenol , ibuprofen , hot baths, and a heating pad provide some relief. Ice worsens the tightness.  On Monday she developed nausea and severe central chest pain radiating to the left shoulder and collarbone, with hyperventilation and crying. Her husband gave her Zofran  and EMS was called. In the hospital she had EKG, troponin, CTA, chest x-ray, and ultrasound, all normal. Chest pain is worse with certain movements and positions and is reproducible with palpation of the chest wall. She denies fever but has ongoing nausea, severe chest pain, and episodes of hyperventilation.  She has anxiety and has had similar chest pain in the past, diagnosed as costochondritis at age 89. She has bipolar disorder and takes Caplyta, which has changed her taste. She previously had severe withdrawal when Abilify  was abruptly  stopped. She now has Medicaid that covers mental health and medical care. She is a mother of five, which increases strain on her daily activities and pain levels.   Review of Systems  Musculoskeletal:  Positive for myalgias.        Objective:    BP 118/76 (Cuff Size: Large)   Pulse 88   Temp 98.3 F (36.8 C) (Oral)   Resp 16   Ht 5' 5 (1.651 m)   Wt 219 lb 11.2 oz (99.7 kg)   LMP 10/02/2023   SpO2 97%   BMI 36.56 kg/m  BP Readings from Last 3 Encounters:  09/09/24 118/76  09/06/24 120/76  08/10/24 (!) 144/84   Wt Readings from Last 3 Encounters:  09/09/24 219 lb 11.2 oz (99.7 kg)  09/06/24 220 lb (99.8 kg)  06/28/24 208 lb (94.3 kg)      Physical Exam Constitutional:      Appearance: Normal appearance.  HENT:     Head: Normocephalic and atraumatic.  Eyes:     Conjunctiva/sclera: Conjunctivae normal.  Cardiovascular:     Rate and Rhythm: Normal rate and regular rhythm.     Comments: Chest pain reproducible on exam Pulmonary:     Effort: Pulmonary effort is normal.     Breath sounds: Normal breath sounds.  Skin:    General: Skin is warm and dry.  Neurological:     General: No focal deficit present.     Mental Status: She is alert. Mental status is at baseline.  Psychiatric:  Mood and Affect: Mood normal.        Behavior: Behavior normal.     No results found for any visits on 09/09/24.      Assessment & Plan:   Assessment & Plan Costochondritis Acute inflammatory chest wall pain, likely muscular. Cardiac and pulmonary causes ruled out. Pain reproducible with palpation, consistent with previous costochondritis. - Prescribed naproxen  500 mg twice daily for 7-10 days with food. - Prescribed tizanidine  at bedtime as a muscle relaxer. - Recommended chest wall stretching exercises, including cat maneuver and shoulder rolls. - Advised use of heating pad and topical analgesics like Voltaren gel or Icy Hot as needed.  Muscle spasm of left  calf Muscle spasm possibly related to recent fall, though etiology is unclear. Symptoms suggest muscular strain or tendinitis. Electrolyte imbalance ruled out. - Prescribed naproxen  500 mg twice daily for 7-10 days with food. - Prescribed tizanidine  at bedtime as a muscle relaxer. - Advised calf stretching exercises, including toe pointing and stretching. - Encouraged hydration and vitamin intake for muscle health.  Hyperglycemia -Refill blood glucose testing strips.   - naproxen  (NAPROSYN ) 500 MG tablet; Take 1 tablet (500 mg total) by mouth 2 (two) times daily with a meal for 10 days.  Dispense: 20 tablet; Refill: 0 - tiZANidine  (ZANAFLEX ) 4 MG tablet; Take 1 tablet (4 mg total) by mouth at bedtime as needed.  Dispense: 30 tablet; Refill: 0 - Accu-Chek Softclix Lancets lancets; Use as instructed  Dispense: 100 each; Refill: 12   Return in about 3 months (around 12/07/2024) for physical.  Sharyle Fischer, DO   "

## 2024-09-27 ENCOUNTER — Telehealth: Payer: Self-pay

## 2024-11-19 ENCOUNTER — Encounter: Payer: MEDICAID | Admitting: Internal Medicine
